# Patient Record
Sex: Male | Born: 1960 | Race: Black or African American | Hispanic: No | Marital: Married | Smoking: Current some day smoker
Health system: Southern US, Community
[De-identification: ages and names within clinical notes are randomized; demographics above are authoritative.]

## PROBLEM LIST (undated history)

## (undated) DIAGNOSIS — I2510 Atherosclerotic heart disease of native coronary artery without angina pectoris: Secondary | ICD-10-CM

## (undated) DIAGNOSIS — I428 Other cardiomyopathies: Secondary | ICD-10-CM

## (undated) DIAGNOSIS — I119 Hypertensive heart disease without heart failure: Secondary | ICD-10-CM

## (undated) DIAGNOSIS — I5020 Unspecified systolic (congestive) heart failure: Secondary | ICD-10-CM

## (undated) DIAGNOSIS — I4819 Other persistent atrial fibrillation: Secondary | ICD-10-CM

## (undated) DIAGNOSIS — I10 Essential (primary) hypertension: Secondary | ICD-10-CM

## (undated) DIAGNOSIS — N182 Chronic kidney disease, stage 2 (mild): Secondary | ICD-10-CM

## (undated) DIAGNOSIS — I509 Heart failure, unspecified: Secondary | ICD-10-CM

## (undated) HISTORY — DX: Heart failure, unspecified: I50.9

## (undated) HISTORY — DX: Essential (primary) hypertension: I10

---

## 2002-11-04 ENCOUNTER — Emergency Department (HOSPITAL_COMMUNITY): Admission: EM | Admit: 2002-11-04 | Discharge: 2002-11-04 | Payer: Self-pay

## 2002-11-04 ENCOUNTER — Encounter: Payer: Self-pay | Admitting: Emergency Medicine

## 2002-11-19 ENCOUNTER — Emergency Department (HOSPITAL_COMMUNITY): Admission: EM | Admit: 2002-11-19 | Discharge: 2002-11-19 | Payer: Self-pay | Admitting: Emergency Medicine

## 2014-06-05 ENCOUNTER — Emergency Department: Payer: Self-pay | Admitting: Emergency Medicine

## 2014-06-06 LAB — BASIC METABOLIC PANEL
Anion Gap: 9 (ref 7–16)
BUN: 13 mg/dL (ref 7–18)
Calcium, Total: 8 mg/dL — ABNORMAL LOW (ref 8.5–10.1)
Chloride: 104 mmol/L (ref 98–107)
Co2: 28 mmol/L (ref 21–32)
Creatinine: 1.2 mg/dL (ref 0.60–1.30)
EGFR (African American): >60
EGFR (Non-African Amer.): >60
Glucose: 98 mg/dL (ref 65–99)
Osmolality: 281 (ref 275–301)
Potassium: 3.9 mmol/L (ref 3.5–5.1)
Sodium: 141 mmol/L (ref 136–145)

## 2014-06-06 LAB — PROTIME-INR
INR: 1
Prothrombin Time: 12.6 secs (ref 11.5–14.7)

## 2014-06-06 LAB — CBC
HCT: 46.8 % (ref 40.0–52.0)
HGB: 16.1 g/dL (ref 13.0–18.0)
MCH: 30.5 pg (ref 26.0–34.0)
MCHC: 34.4 g/dL (ref 32.0–36.0)
MCV: 89 fL (ref 80–100)
Platelet: 245 10*3/uL (ref 150–440)
RBC: 5.27 10*6/uL (ref 4.40–5.90)
RDW: 13.8 % (ref 11.5–14.5)
WBC: 18.4 10*3/uL — ABNORMAL HIGH (ref 3.8–10.6)

## 2014-06-22 ENCOUNTER — Emergency Department: Payer: Self-pay | Admitting: Emergency Medicine

## 2016-11-02 ENCOUNTER — Emergency Department: Payer: Non-veteran care

## 2016-11-02 ENCOUNTER — Observation Stay (HOSPITAL_BASED_OUTPATIENT_CLINIC_OR_DEPARTMENT_OTHER): Payer: Non-veteran care

## 2016-11-02 ENCOUNTER — Observation Stay
Admission: EM | Admit: 2016-11-02 | Discharge: 2016-11-02 | Disposition: A | Discharge status: Home / Self Care | Payer: Non-veteran care | Attending: Internal Medicine | Admitting: Internal Medicine

## 2016-11-02 DIAGNOSIS — R61 Generalized hyperhidrosis: Secondary | ICD-10-CM | POA: Insufficient documentation

## 2016-11-02 DIAGNOSIS — R0600 Dyspnea, unspecified: Secondary | ICD-10-CM | POA: Insufficient documentation

## 2016-11-02 DIAGNOSIS — D72829 Elevated white blood cell count, unspecified: Secondary | ICD-10-CM | POA: Diagnosis not present

## 2016-11-02 DIAGNOSIS — Z8249 Family history of ischemic heart disease and other diseases of the circulatory system: Secondary | ICD-10-CM | POA: Diagnosis not present

## 2016-11-02 DIAGNOSIS — I517 Cardiomegaly: Secondary | ICD-10-CM | POA: Diagnosis not present

## 2016-11-02 DIAGNOSIS — Z9114 Patient's other noncompliance with medication regimen: Secondary | ICD-10-CM | POA: Insufficient documentation

## 2016-11-02 DIAGNOSIS — R0789 Other chest pain: Secondary | ICD-10-CM | POA: Diagnosis not present

## 2016-11-02 DIAGNOSIS — Z720 Tobacco use: Secondary | ICD-10-CM

## 2016-11-02 DIAGNOSIS — I4581 Long QT syndrome: Secondary | ICD-10-CM | POA: Diagnosis not present

## 2016-11-02 DIAGNOSIS — R0609 Other forms of dyspnea: Secondary | ICD-10-CM | POA: Diagnosis present

## 2016-11-02 DIAGNOSIS — F17210 Nicotine dependence, cigarettes, uncomplicated: Secondary | ICD-10-CM | POA: Insufficient documentation

## 2016-11-02 DIAGNOSIS — R079 Chest pain, unspecified: Secondary | ICD-10-CM

## 2016-11-02 DIAGNOSIS — I160 Hypertensive urgency: Principal | ICD-10-CM

## 2016-11-02 HISTORY — DX: Essential (primary) hypertension: I10

## 2016-11-02 LAB — BASIC METABOLIC PANEL
Anion gap: 7 (ref 5–15)
BUN: 14 mg/dL (ref 6–20)
CALCIUM: 8.9 mg/dL (ref 8.9–10.3)
CO2: 28 mmol/L (ref 22–32)
CREATININE: 1.24 mg/dL (ref 0.61–1.24)
Chloride: 103 mmol/L (ref 101–111)
GFR calc Af Amer: >60 mL/min (ref >60)
GLUCOSE: 103 mg/dL — AB (ref 65–99)
Potassium: 4.2 mmol/L (ref 3.5–5.1)
Sodium: 138 mmol/L (ref 135–145)

## 2016-11-02 LAB — CBC
HEMATOCRIT: 42.5 % (ref 40.0–52.0)
Hemoglobin: 15.2 g/dL (ref 13.0–18.0)
MCH: 29.2 pg (ref 26.0–34.0)
MCHC: 35.6 g/dL (ref 32.0–36.0)
MCV: 81.9 fL (ref 80.0–100.0)
PLATELETS: 265 10*3/uL (ref 150–440)
RBC: 5.19 MIL/uL (ref 4.40–5.90)
RDW: 15.1 % — AB (ref 11.5–14.5)
WBC: 19.3 10*3/uL — AB (ref 3.8–10.6)

## 2016-11-02 LAB — NM MYOCAR MULTI W/SPECT W/WALL MOTION / EF
CHL CUP NUCLEAR SRS: 4
CHL CUP NUCLEAR SSS: 0
CHL CUP RESTING HR STRESS: 65 {beats}/min
CSEPHR: 50 %
CSEPPHR: 83 {beats}/min
LV dias vol: 165 mL (ref 62–150)
LV sys vol: 77 mL
SDS: 0
TID: 0.99

## 2016-11-02 LAB — TROPONIN I
Troponin I: <0.03 ng/mL (ref <0.03)
Troponin I: <0.03 ng/mL (ref <0.03)
Troponin I: <0.03 ng/mL (ref <0.03)

## 2016-11-02 LAB — TSH: TSH: 2.217 u[IU]/mL (ref 0.350–4.500)

## 2016-11-02 MED ORDER — REGADENOSON 0.4 MG/5ML IV SOLN
0.4000 mg | Freq: Once | INTRAVENOUS | Status: AC
  Administered 2016-11-02: 0.4 mg via INTRAVENOUS

## 2016-11-02 MED ORDER — ACETAMINOPHEN 325 MG PO TABS: 650.0000 mg | ORAL_TABLET | Freq: Four times a day (QID) | ORAL | Status: DC | PRN

## 2016-11-02 MED ORDER — LISINOPRIL-HYDROCHLOROTHIAZIDE 20-12.5 MG PO TABS: 1.0000 | ORAL_TABLET | Freq: Every day | ORAL | 0 refills | Status: DC

## 2016-11-02 MED ORDER — FUROSEMIDE 10 MG/ML IJ SOLN
60.0000 mg | Freq: Once | INTRAMUSCULAR | Status: AC
  Administered 2016-11-02: 60 mg via INTRAVENOUS
  Filled 2016-11-02: qty 6

## 2016-11-02 MED ORDER — DEXTROSE-NACL 5-0.9 % IV SOLN
INTRAVENOUS | Status: DC
  Administered 2016-11-02: 06:00:00 via INTRAVENOUS

## 2016-11-02 MED ORDER — NITROGLYCERIN 2 % TD OINT
2.0000 [in_us] | TOPICAL_OINTMENT | Freq: Once | TRANSDERMAL | Status: AC
  Administered 2016-11-02: 2 [in_us] via TOPICAL
  Filled 2016-11-02: qty 2

## 2016-11-02 MED ORDER — ASPIRIN 81 MG PO TBEC: 81.0000 mg | DELAYED_RELEASE_TABLET | Freq: Every day | ORAL | Status: DC

## 2016-11-02 MED ORDER — CYCLOBENZAPRINE HCL 10 MG PO TABS
5.0000 mg | ORAL_TABLET | Freq: Once | ORAL | Status: AC
  Administered 2016-11-02: 5 mg via ORAL
  Filled 2016-11-02: qty 1

## 2016-11-02 MED ORDER — ASPIRIN 81 MG PO CHEW
324.0000 mg | CHEWABLE_TABLET | Freq: Once | ORAL | Status: AC
  Administered 2016-11-02: 324 mg via ORAL
  Filled 2016-11-02: qty 4

## 2016-11-02 MED ORDER — LABETALOL HCL 5 MG/ML IV SOLN: 5.0000 mg | Freq: Once | INTRAVENOUS | Status: DC

## 2016-11-02 MED ORDER — LABETALOL HCL 5 MG/ML IV SOLN
5.0000 mg | Freq: Once | INTRAVENOUS | Status: AC
  Administered 2016-11-02: 5 mg via INTRAVENOUS
  Filled 2016-11-02: qty 4

## 2016-11-02 MED ORDER — MORPHINE SULFATE (PF) 4 MG/ML IV SOLN
INTRAVENOUS | Status: AC
  Filled 2016-11-02: qty 1

## 2016-11-02 MED ORDER — ACETAMINOPHEN 650 MG RE SUPP
650.0000 mg | Freq: Four times a day (QID) | RECTAL | Status: DC | PRN
Start: 2016-11-02 — End: 2016-11-02

## 2016-11-02 MED ORDER — ONDANSETRON HCL 4 MG/2ML IJ SOLN
INTRAMUSCULAR | Status: AC
  Filled 2016-11-02: qty 2

## 2016-11-02 MED ORDER — DOCUSATE SODIUM 100 MG PO CAPS
100.0000 mg | ORAL_CAPSULE | Freq: Two times a day (BID) | ORAL | Status: DC
  Administered 2016-11-02: 100 mg via ORAL
  Filled 2016-11-02: qty 1

## 2016-11-02 MED ORDER — METOPROLOL TARTRATE 25 MG PO TABS
25.0000 mg | ORAL_TABLET | Freq: Two times a day (BID) | ORAL | Status: DC
  Administered 2016-11-02: 25 mg via ORAL
  Filled 2016-11-02: qty 1

## 2016-11-02 MED ORDER — MORPHINE SULFATE (PF) 4 MG/ML IV SOLN
4.0000 mg | Freq: Once | INTRAVENOUS | Status: AC
  Administered 2016-11-02: 4 mg via INTRAVENOUS

## 2016-11-02 MED ORDER — ENOXAPARIN SODIUM 40 MG/0.4ML ~~LOC~~ SOLN: 40.0000 mg | SUBCUTANEOUS | Status: DC

## 2016-11-02 MED ORDER — ONDANSETRON HCL 4 MG PO TABS: 4.0000 mg | ORAL_TABLET | Freq: Four times a day (QID) | ORAL | Status: DC | PRN

## 2016-11-02 MED ORDER — ONDANSETRON HCL 4 MG/2ML IJ SOLN
4.0000 mg | Freq: Once | INTRAMUSCULAR | Status: AC
  Administered 2016-11-02: 4 mg via INTRAVENOUS

## 2016-11-02 MED ORDER — ONDANSETRON HCL 4 MG/2ML IJ SOLN: 4.0000 mg | Freq: Four times a day (QID) | INTRAMUSCULAR | Status: DC | PRN

## 2016-11-02 MED ORDER — TECHNETIUM TC 99M TETROFOSMIN IV KIT
12.7970 | PACK | Freq: Once | INTRAVENOUS | Status: AC | PRN
  Administered 2016-11-02: 12.797 via INTRAVENOUS

## 2016-11-02 MED ORDER — ASPIRIN EC 81 MG PO TBEC
81.0000 mg | DELAYED_RELEASE_TABLET | Freq: Every day | ORAL | Status: DC
  Administered 2016-11-02: 81 mg via ORAL
  Filled 2016-11-02: qty 1

## 2016-11-02 MED ORDER — SODIUM CHLORIDE 0.9% FLUSH
3.0000 mL | Freq: Two times a day (BID) | INTRAVENOUS | Status: DC
  Administered 2016-11-02: 3 mL via INTRAVENOUS

## 2016-11-02 MED ORDER — TECHNETIUM TC 99M TETROFOSMIN IV KIT
30.3660 | PACK | Freq: Once | INTRAVENOUS | Status: AC | PRN
  Administered 2016-11-02: 30.366 via INTRAVENOUS

## 2016-11-02 NOTE — Consult Note (Signed)
Cardiology Consultation Note  Patient ID: Gregory Howell, MRN: [id number], DOB/AGE: 1961/01/19 56 y.o. Admit date: 11/02/2016   Date of Consult: 11/02/2016 Primary Physician: [facility] Primary Cardiologist: New to [facility] - consult by Fletcher Anon Requesting Physician: Dr. Marcille Blanco, MD  Chief Complaint: Chest pain Reason for Consult: Same  HPI: 56 y.o. male with h/o HTN, leukocytosis of uncertain etiology, ongoing tobacco abuse, medication noncompliance who presented to [facility] on 3/26 with left-sided chest pain with associated shortness of breath. Troponin negative 2. Cardiology asked to further evaluate.  Patient does not have any previously known cardiac history. He has never seen a cardiologist before. He is followed by the [facility]. Patient has a 30-40-pack-year history of tobacco abuse. His brother had his first MI at age 68. Patient has been in his usual state of health up until 89 when he developed a left-sided chest pain that was characterized as dull, 5 out of 10, and radiated to his back area pain improved with rest. No associated symptoms at that time. While at work (patient works as a Training and development officer) on 3/25 patient again developed left-sided chest pain that radiated to his back characterized as sharp and rated 10 out of 10 in intensity. Patient left the kitchen to sit on the steps for approximately 10-15 minutes with some improvement in his chest pain though no full resolution. He was asked to get back to work, upon returning back to the kitchen he again developed 10 out of 10 sharp left-sided chest pain that radiated to his back prompting him to leave work early. Chest pain is worse with deep inspiration and positional movement. When he got home on 3/25 and rested chest pain did improve though never fully resolved. At approximately 9 PM patient's wife left work to come take the patient to [facility] for further evaluation. Chest pain was associated with some shortness of breath. No associated  diaphoresis, nausea, vomiting, dizziness, presyncope, or syncope. Patient has never had pain like this before. However, he does note an episode of substernal chest pressure that radiated to his back approximately 5-6 weeks prior while walking home from work carrying a heavy backpack. Patient reports having long history of hypertension for which she intermittently takes his antihypertensive medication. He reports he last took his medications at least 6 months prior.  Upon the patient's arrival to [facility] they were found to have BP 185/107, heart rate 85 bpm, afebrile, oxygen saturation 97% room air. Troponin negative 2 at current time. White blood cell count 19,300 with prior from 2 years ago noted to be 18,400, hemoglobin 15.2, pelvic count 265, renal function with serum creatinine 1.24 which appears to be his baseline when reviewing be met from 2 years prior, potassium 4.2, TSH normal. ECG showed sinus rhythm with early repolarization and lateral T-wave inversion, CXR showed mild interstitial edema. In the ED patient was given full dose aspirin and IV Lasix 60 mg 1 as well as labetalol and nitroglycerin paste. Blood pressure has currently improved to 138/88. He reports much improved left-sided chest pain that is now characterized as a dull ache.  Past Medical History:  Diagnosis Date  . HTN (hypertension)       Most Recent Cardiac Studies: None   Surgical History: History reviewed. No pertinent surgical history.   Home Meds: Prior to Admission medications   Not on File    Inpatient Medications:  . aspirin EC  81 mg Oral Daily  . docusate sodium  100 mg Oral BID  .  enoxaparin (LOVENOX) injection  40 mg Subcutaneous Q24H  . sodium chloride flush  3 mL Intravenous Q12H     Allergies: No Known Allergies  Social History   Social History  . Marital status: Married    Spouse name: N/A  . Number of children: N/A  . Years of education: N/A   Occupational History  . Not on file.    Social History Main Topics  . Smoking status: Current Every Day Smoker    Types: Cigarettes  . Smokeless tobacco: Never Used  . Alcohol use Not on file  . Drug use: Unknown  . Sexual activity: Not on file   Other Topics Concern  . Not on file   Social History Narrative  . No narrative on file     Family History  Problem Relation Age of Onset  . Hypertension Mother   . Hypertension Father   . CAD Brother   . Diabetes Brother      Review of Systems: Review of Systems  Constitutional: Positive for malaise/fatigue. Negative for chills, diaphoresis, fever and weight loss.  HENT: Negative for congestion.   Eyes: Negative for discharge and redness.  Respiratory: Positive for shortness of breath. Negative for cough, hemoptysis, sputum production and wheezing.   Cardiovascular: Positive for chest pain. Negative for palpitations, orthopnea, claudication, leg swelling and PND.  Gastrointestinal: Negative for abdominal pain, blood in stool, heartburn, melena, nausea and vomiting.  Genitourinary: Negative for hematuria.  Musculoskeletal: Negative for falls and myalgias.  Skin: Negative for rash.  Neurological: Negative for dizziness, tingling, tremors, sensory change, speech change, focal weakness, loss of consciousness and weakness.  Endo/Heme/Allergies: Does not bruise/bleed easily.  Psychiatric/Behavioral: Negative for substance abuse. The patient is not nervous/anxious.   All other systems reviewed and are negative.   Labs:  Recent Labs  11/02/16 0038 11/02/16 0609  TROPONINI <0.03 <0.03   Lab Results  Component Value Date   WBC 19.3 (H) 11/02/2016   HGB 15.2 11/02/2016   HCT 42.5 11/02/2016   MCV 81.9 11/02/2016   PLT 265 11/02/2016     Recent Labs Lab 11/02/16 0038  NA 138  K 4.2  CL 103  CO2 28  BUN 14  CREATININE 1.24  CALCIUM 8.9  GLUCOSE 103*   No results found for: CHOL, HDL, LDLCALC, TRIG No results found for: DDIMER  Radiology/Studies:  Dg  Chest 2 View  Result Date: 11/02/2016 CLINICAL DATA:  Acute onset of cough and shortness of breath. Severe left-sided chest pain. Initial encounter. EXAM: CHEST  2 VIEW COMPARISON:  None. FINDINGS: The lungs are well-aerated. Vascular congestion is noted. Increased interstitial markings raise concern for mild interstitial edema. There is no evidence of pleural effusion or pneumothorax. The heart is normal in size; the mediastinal contour is within normal limits. No acute osseous abnormalities are seen. IMPRESSION: Vascular congestion. Increased interstitial markings raise concern for mild interstitial edema. Electronically Signed   By: Garald Balding M.D.   On: 11/02/2016 01:11    EKG: Interpreted by me showed: NSR, 88 bpm, LVH, early repolarization, lateral TWI Telemetry: Interpreted by me showed: NSR, 70's bpm  Weights: [employer]   11/02/16 0042 11/02/16 0538  Weight: 200 lb (90.7 kg) 209 lb 12.8 oz (95.2 kg)     Physical Exam: Blood pressure 138/88, pulse 66, temperature 98.3 F (36.8 C), temperature source Oral, resp. rate 19, height '5\' 9"'$  (1.753 m), weight 209 lb 12.8 oz (95.2 kg), SpO2 99 %. Body mass index is 30.98 kg/m.  General: Well developed, well nourished, in no acute distress. Head: Normocephalic, atraumatic, sclera non-icteric, no xanthomas, nares are without discharge.  Neck: Negative for carotid bruits. JVD not elevated. Lungs: Clear bilaterally to auscultation without wheezes, rales, or rhonchi. Breathing is unlabored. Heart: RRR with S1 S2. No murmurs, rubs, or gallops appreciated. Palpation over the left anterior chest wall reproduces patient's pain. Movement worsens patient's pain.  Abdomen: Soft, non-tender, non-distended with normoactive bowel sounds. No hepatomegaly. No rebound/guarding. No obvious abdominal masses. Msk:  Strength and tone appear normal for age. Extremities: No clubbing or cyanosis. No edema. Distal pedal pulses are 2+ and equal  bilaterally. Neuro: Alert and oriented X 3. No facial asymmetry. No focal deficit. Moves all extremities spontaneously. Psych:  Responds to questions appropriately with a normal affect.    Assessment and Plan:  Principal Problem:   Chest pain with moderate risk for cardiac etiology Active Problems:   Hypertensive urgency   Tobacco abuse   Leukocytosis    1. Chest pain with moderate risk for cardiac etiology: -Troponin negative 2 -Symptoms appear mostly atypical in that pain is worse with deep inspiration and positional movement, possibly pulmonary or musculoskeletal in etiology -Risk factors for coronary artery disease include tobacco abuse, hypertension, family history with brother with MI at age 26 -Possibly in the setting of patient's hypertensive urgency -Cannot definitively rule out aortic dissection at this time given patient's blood pressure greater than 329 systolic upon presentation as well as chest pain radiating to the back. Consider CTA aorta protocol, will defer to internal medicine  -Schedule Lexiscan Myoview to evaluate for high-risk ischemia -ASA -Start low-dose Lopressor, titrate as needed -Check lipid panel for further risk stratification  2. Hypertensive urgency: -Has been noncompliant with antihypertensive medications, last taking them at least 6 months prior -Improved in the ED with IV labetalol, currently no antihypertensive medication ordered -Start low-dose Lopressor, titrate as needed -If needed and if has normal EF by echo would use amlodipine and titrate as needed -Possibly playing a role in the above -As above, cannot rule out aortic dissection  3. Chronic leukocytosis: -Patient noted to have white blood cell count 18,402 years prior. Upon admission on 3/26 patient was noted to have leukocytosis with white blood cell count 19,300 -No obvious signs of infection, afebrile -Uncertain what blood work he has had through the [place] [facility] -Discussed  with internal medicine  4. Tobacco abuse: -Cessation is advised  5. Hyperglycemia: -A1c pending   Signed, Christell Faith, PA-C [place] Pager: [phone] 11/02/2016, 10:19 AM

## 2016-11-02 NOTE — ED Triage Notes (Signed)
Pt states that while at work this afternoon he started having severe chest pain, states that he went home and laid down, pt states that the pain eased some, but cont to have it on the left side of his chest, pt reports that he hasn't had his bp meds in 6 mos

## 2016-11-02 NOTE — H&P (Signed)
Gregory Howell is an 56 y.o. male.   Chief Complaint: Chest pain HPI: The patient with past medical history of hypertension presents emergency department with chest pain. The pain began while the patient was at work. 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 The pain was more left-sided over time and eventually radiated into his left shoulder. The patient admits to feeling this way yesterday as well. Upon arrival his blood pressure was greater than 180/100. The patient admits not to having his blood pressure medication for the last 6 months. His blood pressure improved with labetalol and nitroglycerin. EKG did not show any changes consistent with ischemia and initial troponin was negative. However due to uncontrolled blood pressure and chest pain the emergency department staff called the hospitalist service for admission.  Past Medical History:  Diagnosis Date  . HTN (hypertension)     No past surgical history on file. None  Family History  Problem Relation Age of Onset  . Hypertension Mother   . Hypertension Father   . CAD Brother   . Diabetes Brother    Social History:  reports that he has been smoking Cigarettes.  He has never used smokeless tobacco. His alcohol and drug histories are not on file.  Allergies: No Known Allergies  No prescriptions prior to admission.    Results for orders placed or performed during the hospital encounter of 11/02/16 (from the past 48 hour(s))  Basic metabolic panel     Status: Abnormal   Collection Time: 11/02/16 12:38 AM  Result Value Ref Range   Sodium 138 135 - 145 mmol/L   Potassium 4.2 3.5 - 5.1 mmol/L   Chloride 103 101 - 111 mmol/L   CO2 28 22 - 32 mmol/L   Glucose, Bld 103 (H) 65 - 99 mg/dL   BUN 14 6 - 20 mg/dL   Creatinine, Ser 1.24 0.61 - 1.24 mg/dL   Calcium 8.9 8.9 - 10.3 mg/dL   GFR calc non Af Amer >60 >60 mL/min   GFR calc Af Amer >60 >60 mL/min    Comment:  (NOTE) The eGFR has been calculated using the CKD EPI equation. This calculation has not been validated in all clinical situations. eGFR's persistently <60 mL/min signify possible Chronic Kidney Disease.    Anion gap 7 5 - 15  CBC     Status: Abnormal   Collection Time: 11/02/16 12:38 AM  Result Value Ref Range   WBC 19.3 (H) 3.8 - 10.6 K/uL   RBC 5.19 4.40 - 5.90 MIL/uL   Hemoglobin 15.2 13.0 - 18.0 g/dL   HCT 42.5 40.0 - 52.0 %   MCV 81.9 80.0 - 100.0 fL   MCH 29.2 26.0 - 34.0 pg   MCHC 35.6 32.0 - 36.0 g/dL   RDW 15.1 (H) 11.5 - 14.5 %   Platelets 265 150 - 440 K/uL  Troponin I     Status: None   Collection Time: 11/02/16 12:38 AM  Result Value Ref Range   Troponin I <0.03 <0.03 ng/mL  TSH     Status: None   Collection Time: 11/02/16  6:09 AM  Result Value Ref Range   TSH 2.217 0.350 - 4.500 uIU/mL    Comment: Performed by a 3rd Generation assay with a functional sensitivity of <=0.01 uIU/mL.  Troponin I     Status: None   Collection Time: 11/02/16  6:09 AM  Result Value Ref Range  Troponin I <0.03 <0.03 ng/mL   Dg Chest 2 View  Result Date: 11/02/2016 CLINICAL DATA:  Acute onset of cough and shortness of breath. Severe left-sided chest pain. Initial encounter. EXAM: CHEST  2 VIEW COMPARISON:  None. FINDINGS: The lungs are well-aerated. Vascular congestion is noted. Increased interstitial markings raise concern for mild interstitial edema. There is no evidence of pleural effusion or pneumothorax. The heart is normal in size; the mediastinal contour is within normal limits. No acute osseous abnormalities are seen. IMPRESSION: Vascular congestion. Increased interstitial markings raise concern for mild interstitial edema. Electronically Signed   By: Garald Balding M.D.   On: 11/02/2016 01:11    Review of Systems  Constitutional: Positive for diaphoresis. Negative for chills and fever.  HENT: Negative for sore throat and tinnitus.   Eyes: Negative for blurred vision and  redness.  Respiratory: Negative for cough and shortness of breath.   Cardiovascular: Positive for chest pain. Negative for palpitations, orthopnea and PND.  Gastrointestinal: Negative for abdominal pain, diarrhea, nausea and vomiting.  Genitourinary: Negative for dysuria, frequency and urgency.  Musculoskeletal: Negative for joint pain and myalgias.  Skin: Negative for rash.       No lesions  Neurological: Negative for speech change, focal weakness and weakness.  Endo/Heme/Allergies: Does not bruise/bleed easily.       No temperature intolerance  Psychiatric/Behavioral: Negative for depression and suicidal ideas.    Blood pressure 138/88, pulse 66, temperature 98.3 F (36.8 C), temperature source Oral, resp. rate 19, height '5\' 9"'$  (1.753 m), weight 95.2 kg (209 lb 12.8 oz), SpO2 99 %. Physical Exam  Constitutional: He is oriented to person, place, and time. He appears well-developed and well-nourished. No distress.  HENT:  Head: Normocephalic and atraumatic.  Mouth/Throat: Oropharynx is clear and moist.  Eyes: Conjunctivae and EOM are normal. Pupils are equal, round, and reactive to light. No scleral icterus.  Neck: Normal range of motion. Neck supple. No JVD present. No tracheal deviation present. No thyromegaly present.  Cardiovascular: Normal rate, regular rhythm and normal heart sounds.  Exam reveals no gallop and no friction rub.   No murmur heard. Respiratory: Effort normal and breath sounds normal. No respiratory distress.  GI: Soft. Bowel sounds are normal. He exhibits no distension. There is no tenderness.  Genitourinary:  Genitourinary Comments: Deferred  Musculoskeletal: Normal range of motion. He exhibits no edema.  Lymphadenopathy:    He has no cervical adenopathy.  Neurological: He is alert and oriented to person, place, and time. No cranial nerve deficit.  Skin: Skin is warm and dry. No rash noted. No erythema.  Psychiatric: He has a normal mood and affect. His  behavior is normal. Judgment and thought content normal.     Assessment/Plan This is a 56 year old male admitted for chest pain. 1. Chest pain: Resolved. I started the patient on aspirin. He was given some Lasix in the emergency department due to concern for pulmonary congestion. Currently the patient's lungs are clear. He does not have edema and denies history of dyspnea on exertion or orthopnea. 2. Hypertensive urgency: Uncontrolled blood pressure and chest pain. Improving with nitroglycerin and labetalol. Likely secondary to noncompliance with antihypertensive medication. I made the patient nothing by mouth in anticipation of potential stress test. Cardiology has been consulted area 3. DVT prophylaxis: Lovenox 4. GI prophylaxis: None The patient is a full code. Time spent on admission orders and patient care approximately 45 minutes  Harrie Foreman, MD 11/02/2016, 7:52 AM

## 2016-11-02 NOTE — Progress Notes (Signed)
Discharged instructions along home medication list and follow up gone over with patient. Patient verbalized that he understood instructions. Printed rx given to patient. Iv removed and telemetry removed. No c/o chest pain./ c/o right leg cramps. Wife to transport patient home on ra.

## 2016-11-02 NOTE — Care Management (Addendum)
Patient's discharge medication is on the [facility] four dollar list .  Patient will be able to pay for it without going to the [place]

## 2016-11-02 NOTE — ED Notes (Signed)
Pt placed on oxygen at 2lpm for pox while sleeping of 88%.

## 2016-11-02 NOTE — ED Notes (Signed)
Pt updated on admission process. Pt verbalizes understanding.  

## 2016-11-02 NOTE — Care Management (Addendum)
It appears patient's payor is the [place].  Notified [facility] [place] that patient has been placed in observation and faxed the history and physical. Would not qualify for transfer because [facility] not [facility] observation [facility].

## 2016-11-02 NOTE — Discharge Instructions (Signed)
Low salt diet ° °Activity as tolerated °

## 2016-11-02 NOTE — ED Notes (Addendum)
Report to rebecca, rn for lunch relief. Dr. Dolores Frame notified of htn.

## 2016-11-03 LAB — HIV ANTIBODY (ROUTINE TESTING W REFLEX): HIV Screen 4th Generation wRfx: NONREACTIVE

## 2016-11-03 LAB — HEMOGLOBIN A1C
Hgb A1c MFr Bld: 5.3 % (ref 4.8–5.6)
Mean Plasma Glucose: 105 mg/dL

## 2016-11-04 NOTE — Discharge Summary (Signed)
[employer] - [place] at [facility]   PATIENT NAME: Gregory Howell    MR#:  [id number]  DATE OF BIRTH:  1960-08-16  DATE OF ADMISSION:  11/02/2016 ADMITTING PHYSICIAN: Arnaldo Natal, MD  DATE OF DISCHARGE: 11/02/2016  4:01 PM  PRIMARY CARE PHYSICIAN: [facility]   ADMISSION DIAGNOSIS:  Dyspnea on exertion [R06.09] Hypertensive urgency [I16.0] Chest pain, unspecified type [R07.9]  DISCHARGE DIAGNOSIS:  Principal Problem:   Chest pain with moderate risk for cardiac etiology Active Problems:   Hypertensive urgency   Tobacco abuse   Leukocytosis   SECONDARY DIAGNOSIS:   Past Medical History:  Diagnosis Date  . HTN (hypertension)      ADMITTING HISTORY  HPI: The patient with past medical history of hypertension presents emergency department with chest pain. The pain began while the patient was at work. 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 The pain was more left-sided over time and eventually radiated into his left shoulder. The patient admits to feeling this way yesterday as well. Upon arrival his blood pressure was greater than 180/100. The patient admits not to having his blood pressure medication for the last 6 months. His blood pressure improved with labetalol and nitroglycerin. EKG did not show any changes consistent with ischemia and initial troponin was negative. However due to uncontrolled blood pressure and chest pain the emergency department staff called the hospitalist service for admission.  HOSPITAL COURSE:   * Chest pain, atypical Admitted to telemetry floor. Ruled out for acute coronary syndrome with normal troponins. Seen by cardiology. Stress test was low risk. He was started on blood pressure medications due to accelerated hypertension. Patient has been discharged home to follow-up with primary care physician and cardiology as outpatient.  Stable for  discharge.  CONSULTS OBTAINED:  Treatment Team:  Iran Ouch, MD  DRUG ALLERGIES:  No Known Allergies  DISCHARGE MEDICATIONS:   Discharge Medication List as of 11/02/2016  3:27 PM    START taking these medications   Details  aspirin EC 81 MG EC tablet Take 1 tablet (81 mg total) by mouth daily., Starting Tue 11/03/2016, OTC    lisinopril-hydrochlorothiazide (ZESTORETIC) 20-12.5 MG tablet Take 1 tablet by mouth daily., Starting Mon 11/02/2016, Print        Today   VITAL SIGNS:  Blood pressure (!) 146/90, pulse 71, temperature 98.5 F (36.9 C), temperature source Oral, resp. rate 18, height 5\' 9"  (1.753 m), weight 95.2 kg (209 lb 12.8 oz), SpO2 98 %.  I/O:  No intake or output data in the 24 hours ending 11/04/16 1121  PHYSICAL EXAMINATION:  Physical Exam  GENERAL:  56 y.o.-year-old patient lying in the bed with no acute distress.  LUNGS: Normal breath sounds bilaterally, no wheezing, rales,rhonchi or crepitation. No use of accessory muscles of respiration.  CARDIOVASCULAR: S1, S2 normal. No murmurs, rubs, or gallops.  ABDOMEN: Soft, non-tender, non-distended. Bowel sounds present. No organomegaly or mass.  NEUROLOGIC: Moves all 4 extremities. PSYCHIATRIC: The patient is alert and oriented x 3.  SKIN: No obvious rash, lesion, or ulcer.   DATA REVIEW:   CBC  Recent Labs Lab 11/02/16 0038  WBC 19.3*  HGB 15.2  HCT 42.5  PLT 265    Chemistries   Recent Labs Lab 11/02/16 0038  NA 138  K 4.2  CL 103  CO2 28  GLUCOSE 103*  BUN 14  CREATININE 1.24  CALCIUM 8.9  Cardiac Enzymes  Recent Labs Lab 11/02/16 1247  TROPONINI <0.03    Microbiology Results  No results found for this or any previous visit.  RADIOLOGY:  Nm Myocar Multi W/spect W/wall Motion / Ef  Result Date: 11/02/2016  Defect 1: There is a small defect of mild severity present in the apex location. This is likely due to apical thinning artifact.  The study is normal.  This is a  low risk study.  Nuclear stress EF: 50%. The left ventricle is mildly dilated.     Follow up with PCP in 1 week.  Management plans discussed with the patient, family and they are in agreement.  CODE STATUS:  Code Status History    Date Active Date Inactive Code Status Order ID Comments User Context   11/02/2016  5:37 AM 11/02/2016  7:01 PM Full Code [id number]  Arnaldo Natal, MD Inpatient      TOTAL TIME TAKING CARE OF THIS PATIENT ON DAY OF DISCHARGE: more than 30 minutes.   Milagros Loll R M.D on 11/04/2016 at 11:21 AM  Between 7am to 6pm - Pager - [phone]  After 6pm go to [email or web address] - password [email or web address] [facility]  [facility] [facility]  Office  [phone]  CC: Primary care physician; [facility]  Note: This dictation was prepared with Dragon dictation along with smaller phrase technology. Any transcriptional errors that result from this process are unintentional.

## 2016-11-24 NOTE — ED Provider Notes (Signed)
[facility] Emergency Department Provider Note   ____________________________________________   First MD Initiated Contact with Patient 11/02/16 [phone]     (approximate)  I have reviewed the triage vital signs and the nursing notes.   HISTORY  Chief Complaint Chest Pain    HPI Gregory Howell is a 56 y.o. male who presents to the ED from home with a chief complain of chest pain. 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 He went home and lay down but pain persisted. Complains of left chest pressure without associated diaphoresis, shortness of breath, nausea or vomiting. Admits to medical noncompliance with antihypertensives. Denies recent fever, chills, abdominal pain, diarrhea. Denies recent travel or trauma.   Past Medical History:  Diagnosis Date  . HTN (hypertension)     Patient Active Problem List   Diagnosis Date Noted  . Chest pain with moderate risk for cardiac etiology 11/02/2016  . Hypertensive urgency 11/02/2016  . Tobacco abuse 11/02/2016  . Leukocytosis 11/02/2016    History reviewed. No pertinent surgical history.  Prior to Admission medications   Medication Sig Start Date End Date Taking? Authorizing Provider  aspirin EC 81 MG EC tablet Take 1 tablet (81 mg total) by mouth daily. 11/03/16   Srikar Sudini, MD  lisinopril-hydrochlorothiazide (ZESTORETIC) 20-12.5 MG tablet Take 1 tablet by mouth daily. 11/02/16   Milagros Loll, MD    Allergies Patient has no known allergies.  Family History  Problem Relation Age of Onset  . Hypertension Mother   . Hypertension Father   . CAD Brother   . Diabetes Brother     Social History Social History  Substance Use Topics  . Smoking status: Current Every Day Smoker    Types: Cigarettes  . Smokeless tobacco: Never Used  . Alcohol use Not on file    Review of Systems  Constitutional: No fever/chills. Eyes: No visual changes. ENT: No sore  throat. Cardiovascular: Positive for chest pain. Respiratory: Denies shortness of breath. Gastrointestinal: No abdominal pain.  No nausea, no vomiting.  No diarrhea.  No constipation. Genitourinary: Negative for dysuria. Musculoskeletal: Negative for back pain. Skin: Negative for rash. Neurological: Negative for headaches, focal weakness or numbness.  10-point ROS otherwise negative.  ____________________________________________   PHYSICAL EXAM:  VITAL SIGNS: ED Triage Vitals  Enc Vitals Group     BP 11/02/16 0041 (!) 185/107     Pulse Rate 11/02/16 0041 87     Resp 11/02/16 0041 18     Temp 11/02/16 0041 98.5 F (36.9 C)     Temp Source 11/02/16 0041 Oral     SpO2 11/02/16 0041 97 %     Weight 11/02/16 0042 200 lb (90.7 kg)     Height 11/02/16 0042 5\' 9"  (1.753 m)     Head Circumference --      Peak Flow --      Pain Score 11/02/16 0042 2     Pain Loc --      Pain Edu? --      Excl. in GC? --     Constitutional: Alert and oriented. Well appearing and in no acute distress. Eyes: Conjunctivae are normal. PERRL. EOMI. Head: Atraumatic. Nose: No congestion/rhinnorhea. Mouth/Throat: Mucous membranes are moist.  Oropharynx non-erythematous. Neck: No stridor.  No carotid bruits. Cardiovascular: Normal rate, regular rhythm. Grossly normal heart sounds.  Good peripheral circulation. Respiratory: Normal respiratory effort.  No retractions. Lungs CTAB. Gastrointestinal: Soft and nontender. No distention.  No abdominal bruits. No CVA tenderness. Musculoskeletal: No lower extremity tenderness nor edema.  No joint effusions. Neurologic:  Normal speech and language. No gross focal neurologic deficits are appreciated. No gait instability. Skin:  Skin is warm, dry and intact. No rash noted. Psychiatric: Mood and affect are normal. Speech and behavior are normal.  ____________________________________________   LABS (all labs ordered are listed, but only abnormal results are  displayed)  Labs Reviewed  BASIC METABOLIC PANEL - Abnormal; Notable for the following:       Result Value   Glucose, Bld 103 (*)    All other components within normal limits  CBC - Abnormal; Notable for the following:    WBC 19.3 (*)    RDW 15.1 (*)    All other components within normal limits  TROPONIN I  HIV ANTIBODY (ROUTINE TESTING)  TSH  HEMOGLOBIN A1C  TROPONIN I  TROPONIN I   ____________________________________________  EKG  ED ECG REPORT I, SUNG,JADE J, the attending physician, personally viewed and interpreted this ECG.   Date: 11/24/2016  EKG Time: 0035  Rate: 88  Rhythm: normal EKG, normal sinus rhythm  Axis: Normal  Intervals:LVH  ST&T Change: Nonspecific  ____________________________________________  RADIOLOGY  Chest x-ray interpreted per Dr. Cherly Hensen: Vascular congestion. Increased interstitial markings raise concern  for mild interstitial edema.    ____________________________________________   PROCEDURES  Procedure(s) performed: None  Procedures  Critical Care performed: Yes, see critical care note(s)  CRITICAL CARE Performed by: Irean Hong   Total critical care time: 30 minutes  Critical care time was exclusive of separately billable procedures and treating other patients.  Critical care was necessary to treat or prevent imminent or life-threatening deterioration.  Critical care was time spent personally by me on the following activities: development of treatment plan with patient and/or surrogate as well as nursing, discussions with consultants, evaluation of patient's response to treatment, examination of patient, obtaining history from patient or surrogate, ordering and performing treatments and interventions, ordering and review of laboratory studies, ordering and review of radiographic studies, pulse oximetry and re-evaluation of patient's condition. ____________________________________________   INITIAL IMPRESSION / ASSESSMENT  AND PLAN / ED COURSE  Pertinent labs & imaging results that were available during my care of the patient were reviewed by me and considered in my medical decision making (see chart for details).  56 year old male who presents with chest pain. Medically noncompliant with antihypertensives. Initial EKG and troponin are unremarkable; leukocytosis noted without evidence of infection. Will administer aspirin, morphine, nitroglycerin and discuss with hospitalist to evaluate patient in the emergency department for admission.      ____________________________________________   FINAL CLINICAL IMPRESSION(S) / ED DIAGNOSES  Final diagnoses:  Chest pain, unspecified type  Dyspnea on exertion  Hypertensive urgency      NEW MEDICATIONS STARTED DURING THIS VISIT:  Discharge Medication List as of 11/02/2016  3:27 PM    START taking these medications   Details  aspirin EC 81 MG EC tablet Take 1 tablet (81 mg total) by mouth daily., Starting Tue 11/03/2016, OTC    lisinopril-hydrochlorothiazide (ZESTORETIC) 20-12.5 MG tablet Take 1 tablet by mouth daily., Starting Mon 11/02/2016, Print         Note:  This document was prepared using Dragon voice recognition software and may include unintentional dictation errors.    Irean Hong, MD 11/24/16 [phone]

## 2017-02-22 ENCOUNTER — Emergency Department
Admission: EM | Admit: 2017-02-22 | Discharge: 2017-02-22 | Disposition: A | Discharge status: Home / Self Care | Payer: Non-veteran care | Attending: Emergency Medicine | Admitting: Emergency Medicine

## 2017-02-22 ENCOUNTER — Encounter: Payer: Self-pay | Admitting: Emergency Medicine

## 2017-02-22 DIAGNOSIS — F17210 Nicotine dependence, cigarettes, uncomplicated: Secondary | ICD-10-CM | POA: Diagnosis not present

## 2017-02-22 DIAGNOSIS — F1010 Alcohol abuse, uncomplicated: Secondary | ICD-10-CM | POA: Insufficient documentation

## 2017-02-22 DIAGNOSIS — F10929 Alcohol use, unspecified with intoxication, unspecified: Secondary | ICD-10-CM

## 2017-02-22 DIAGNOSIS — Z79899 Other long term (current) drug therapy: Secondary | ICD-10-CM | POA: Insufficient documentation

## 2017-02-22 DIAGNOSIS — Z7982 Long term (current) use of aspirin: Secondary | ICD-10-CM | POA: Diagnosis not present

## 2017-02-22 DIAGNOSIS — F1994 Other psychoactive substance use, unspecified with psychoactive substance-induced mood disorder: Secondary | ICD-10-CM

## 2017-02-22 DIAGNOSIS — Z789 Other specified health status: Secondary | ICD-10-CM

## 2017-02-22 DIAGNOSIS — Z733 Stress, not elsewhere classified: Secondary | ICD-10-CM | POA: Insufficient documentation

## 2017-02-22 DIAGNOSIS — R45850 Homicidal ideations: Secondary | ICD-10-CM | POA: Diagnosis present

## 2017-02-22 DIAGNOSIS — Z7289 Other problems related to lifestyle: Secondary | ICD-10-CM

## 2017-02-22 LAB — COMPREHENSIVE METABOLIC PANEL
ALBUMIN: 4.2 g/dL (ref 3.5–5.0)
ALK PHOS: 114 U/L (ref 38–126)
ALT: 30 U/L (ref 17–63)
AST: 27 U/L (ref 15–41)
Anion gap: 11 (ref 5–15)
BUN: 14 mg/dL (ref 6–20)
CALCIUM: 8.8 mg/dL — AB (ref 8.9–10.3)
CHLORIDE: 102 mmol/L (ref 101–111)
CO2: 24 mmol/L (ref 22–32)
CREATININE: 1.19 mg/dL (ref 0.61–1.24)
GFR calc Af Amer: >60 mL/min (ref >60)
GFR calc non Af Amer: >60 mL/min (ref >60)
GLUCOSE: 145 mg/dL — AB (ref 65–99)
Potassium: 3.4 mmol/L — ABNORMAL LOW (ref 3.5–5.1)
SODIUM: 137 mmol/L (ref 135–145)
Total Bilirubin: 0.5 mg/dL (ref 0.3–1.2)
Total Protein: 7.8 g/dL (ref 6.5–8.1)

## 2017-02-22 LAB — CBC
HEMATOCRIT: 45.2 % (ref 40.0–52.0)
HEMOGLOBIN: 15.8 g/dL (ref 13.0–18.0)
MCH: 28.9 pg (ref 26.0–34.0)
MCHC: 34.9 g/dL (ref 32.0–36.0)
MCV: 82.7 fL (ref 80.0–100.0)
Platelets: 243 10*3/uL (ref 150–440)
RBC: 5.47 MIL/uL (ref 4.40–5.90)
RDW: 15.6 % — ABNORMAL HIGH (ref 11.5–14.5)
WBC: 11.2 10*3/uL — ABNORMAL HIGH (ref 3.8–10.6)

## 2017-02-22 LAB — URINE DRUG SCREEN, QUALITATIVE (ARMC ONLY)
Amphetamines, Ur Screen: NOT DETECTED
BARBITURATES, UR SCREEN: NOT DETECTED
Benzodiazepine, Ur Scrn: NOT DETECTED
COCAINE METABOLITE, UR ~~LOC~~: NOT DETECTED
Cannabinoid 50 Ng, Ur ~~LOC~~: NOT DETECTED
MDMA (ECSTASY) UR SCREEN: NOT DETECTED
METHADONE SCREEN, URINE: NOT DETECTED
OPIATE, UR SCREEN: NOT DETECTED
Phencyclidine (PCP) Ur S: NOT DETECTED
Tricyclic, Ur Screen: NOT DETECTED

## 2017-02-22 LAB — ACETAMINOPHEN LEVEL

## 2017-02-22 LAB — SALICYLATE LEVEL: Salicylate Lvl: <7 mg/dL (ref 2.8–30.0)

## 2017-02-22 LAB — ETHANOL: Alcohol, Ethyl (B): 174 mg/dL — ABNORMAL HIGH (ref <5)

## 2017-02-22 NOTE — Discharge Instructions (Signed)
Please seek medical attention and help for any thoughts about wanting to harm yourself, harm others, any concerning change in behavior, severe depression, inappropriate drug use or any other new or concerning symptoms. ° °

## 2017-02-22 NOTE — ED Notes (Signed)
Pt given TV remote 
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
## 2017-02-22 NOTE — ED Notes (Signed)
SOC    CALLED 

## 2017-02-22 NOTE — ED Notes (Signed)
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
## 2017-02-22 NOTE — ED Notes (Signed)
PT  VOL  SEEN  BY  DR  CLAPACS  PENDING  D/C

## 2017-02-22 NOTE — ED Notes (Signed)
Dr. Clapacs to bedside at this time.  
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
## 2018-04-16 IMAGING — CR DG CHEST 2V
1 series · 2 of 2 positions shown · non-contrast
Comparison: None.

CLINICAL DATA: Acute onset of cough and shortness of breath. Severe
left-sided chest pain. Initial encounter.

EXAM:
CHEST  2 VIEW

[Series 1: dg chest 2 view · 0.14mm/px · 2 of 2 slices shown]
[im 1/2]
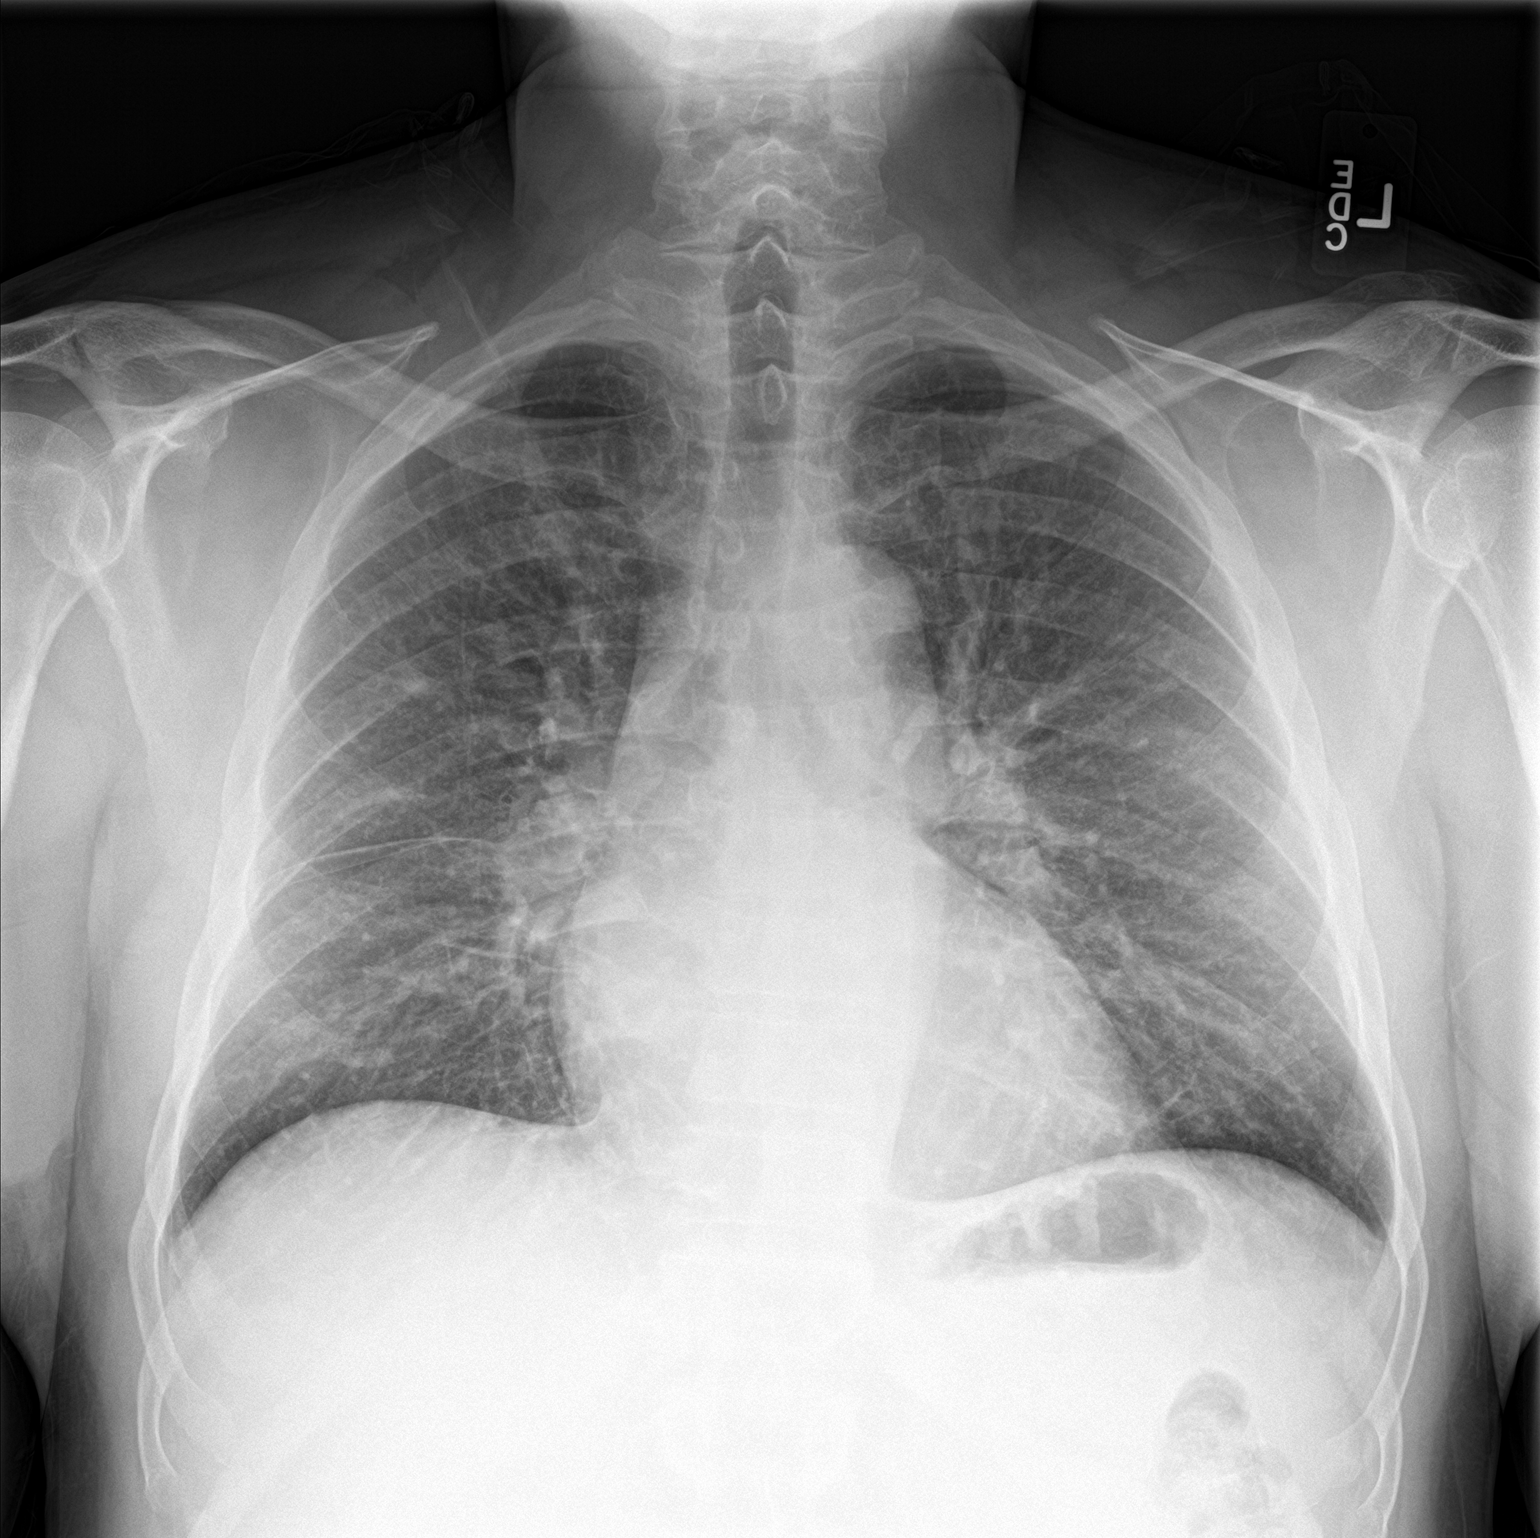
[im 2/2]
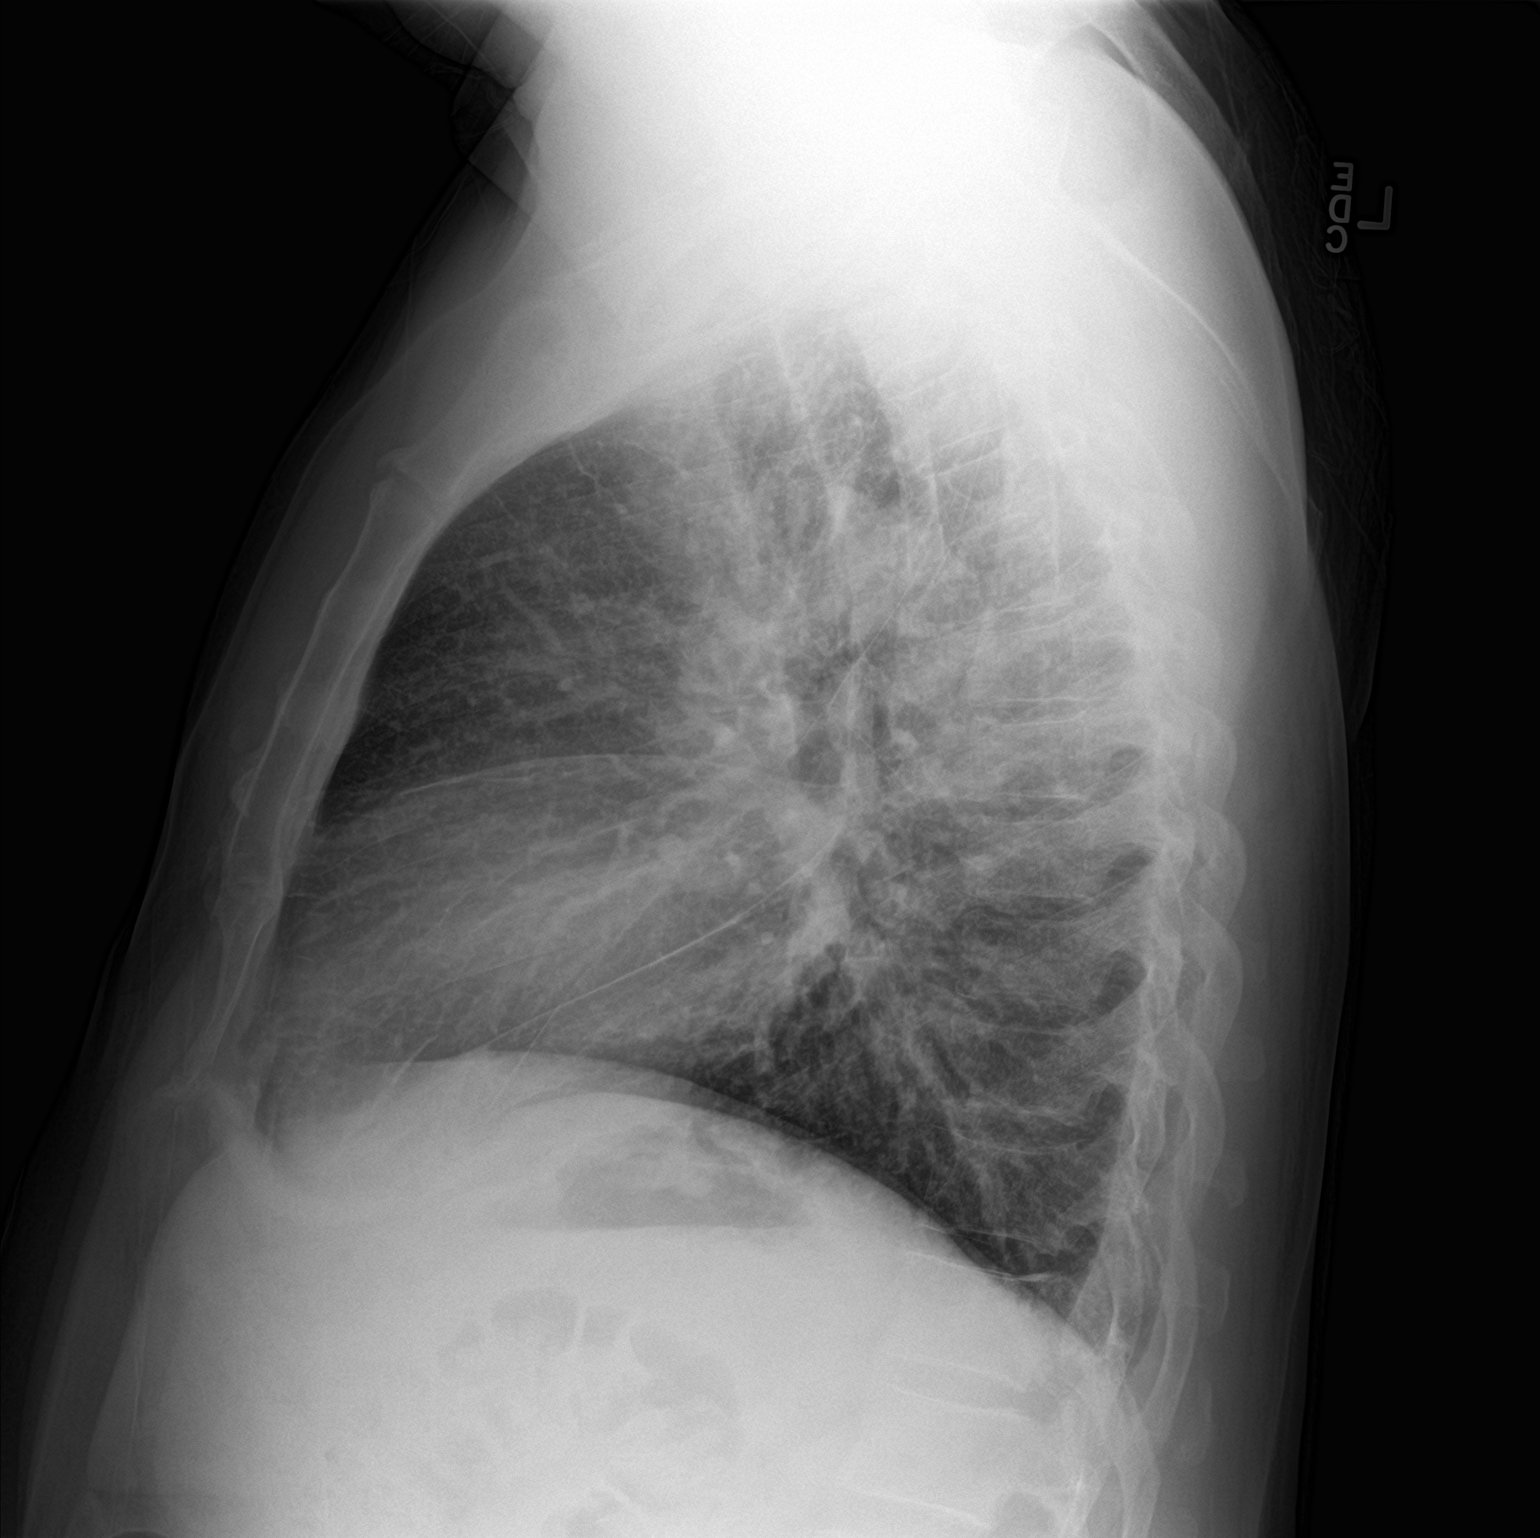

[2 of 2 positions shown; findings below may reference images not displayed]

FINDINGS: The lungs are well-aerated. Vascular congestion is noted. Increased
interstitial markings raise concern for mild interstitial edema.
There is no evidence of pleural effusion or pneumothorax.

The heart is normal in size; the mediastinal contour is within
normal limits. No acute osseous abnormalities are seen.
IMPRESSION: Vascular congestion. Increased interstitial markings raise concern
for mild interstitial edema.

## 2019-10-08 ENCOUNTER — Emergency Department
Admission: EM | Admit: 2019-10-08 | Discharge: 2019-10-08 | Disposition: A | Discharge status: Home / Self Care | Payer: No Typology Code available for payment source | Attending: Emergency Medicine | Admitting: Emergency Medicine

## 2019-10-08 ENCOUNTER — Other Ambulatory Visit: Payer: Self-pay

## 2019-10-08 DIAGNOSIS — Y929 Unspecified place or not applicable: Secondary | ICD-10-CM | POA: Insufficient documentation

## 2019-10-08 DIAGNOSIS — Z79899 Other long term (current) drug therapy: Secondary | ICD-10-CM | POA: Insufficient documentation

## 2019-10-08 DIAGNOSIS — S60221A Contusion of right hand, initial encounter: Secondary | ICD-10-CM

## 2019-10-08 DIAGNOSIS — F17210 Nicotine dependence, cigarettes, uncomplicated: Secondary | ICD-10-CM | POA: Diagnosis not present

## 2019-10-08 DIAGNOSIS — Y999 Unspecified external cause status: Secondary | ICD-10-CM | POA: Insufficient documentation

## 2019-10-08 DIAGNOSIS — W260XXA Contact with knife, initial encounter: Secondary | ICD-10-CM | POA: Insufficient documentation

## 2019-10-08 DIAGNOSIS — S61411A Laceration without foreign body of right hand, initial encounter: Secondary | ICD-10-CM | POA: Insufficient documentation

## 2019-10-08 DIAGNOSIS — Z7982 Long term (current) use of aspirin: Secondary | ICD-10-CM | POA: Diagnosis not present

## 2019-10-08 DIAGNOSIS — Z23 Encounter for immunization: Secondary | ICD-10-CM | POA: Insufficient documentation

## 2019-10-08 DIAGNOSIS — I10 Essential (primary) hypertension: Secondary | ICD-10-CM | POA: Diagnosis not present

## 2019-10-08 DIAGNOSIS — Y9389 Activity, other specified: Secondary | ICD-10-CM | POA: Insufficient documentation

## 2019-10-08 DIAGNOSIS — S6991XA Unspecified injury of right wrist, hand and finger(s), initial encounter: Secondary | ICD-10-CM | POA: Diagnosis present

## 2019-10-08 MED ORDER — LIDOCAINE HCL (PF) 1 % IJ SOLN
5.0000 mL | Freq: Once | INTRAMUSCULAR | Status: AC
  Administered 2019-10-08: 16:00:00 5 mL
  Filled 2019-10-08: qty 5

## 2019-10-08 MED ORDER — TETANUS-DIPHTH-ACELL PERTUSSIS 5-2.5-18.5 LF-MCG/0.5 IM SUSP
0.5000 mL | Freq: Once | INTRAMUSCULAR | Status: AC
  Administered 2019-10-08: 16:00:00 0.5 mL via INTRAMUSCULAR
  Filled 2019-10-08: qty 0.5

## 2019-10-08 MED ORDER — IBUPROFEN 800 MG PO TABS
800.0000 mg | ORAL_TABLET | Freq: Once | ORAL | Status: AC
  Administered 2019-10-08: 800 mg via ORAL
  Filled 2019-10-08: qty 1
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
## 2019-10-08 NOTE — Discharge Instructions (Signed)
You have had your hand laceration repaired using sutures. You have hematoma due to the superficial wound. Keep the wound clean, dry, and covered. Apply ice packs over the dressing to reduce swelling. Take OTC Tylenol and Motrin as needed for pain. Follow-up with the [facility] or this ED in 10-12 days for suture removal.

## 2019-10-12 ENCOUNTER — Other Ambulatory Visit: Payer: Self-pay

## 2019-10-12 ENCOUNTER — Emergency Department
Admission: EM | Admit: 2019-10-12 | Discharge: 2019-10-12 | Disposition: A | Discharge status: Home / Self Care | Payer: No Typology Code available for payment source | Attending: Emergency Medicine | Admitting: Emergency Medicine

## 2019-10-12 DIAGNOSIS — I10 Essential (primary) hypertension: Secondary | ICD-10-CM | POA: Insufficient documentation

## 2019-10-12 DIAGNOSIS — Z7982 Long term (current) use of aspirin: Secondary | ICD-10-CM | POA: Diagnosis not present

## 2019-10-12 DIAGNOSIS — R2231 Localized swelling, mass and lump, right upper limb: Secondary | ICD-10-CM | POA: Insufficient documentation

## 2019-10-12 DIAGNOSIS — F17210 Nicotine dependence, cigarettes, uncomplicated: Secondary | ICD-10-CM | POA: Insufficient documentation

## 2019-10-12 DIAGNOSIS — M79641 Pain in right hand: Secondary | ICD-10-CM | POA: Insufficient documentation

## 2019-10-12 DIAGNOSIS — Z5189 Encounter for other specified aftercare: Secondary | ICD-10-CM | POA: Diagnosis present

## 2019-10-12 DIAGNOSIS — Z79899 Other long term (current) drug therapy: Secondary | ICD-10-CM | POA: Diagnosis not present

## 2019-10-12 DIAGNOSIS — M7989 Other specified soft tissue disorders: Secondary | ICD-10-CM

## 2019-10-12 MED ORDER — SULFAMETHOXAZOLE-TRIMETHOPRIM 800-160 MG PO TABS: 1.0000 | ORAL_TABLET | Freq: Two times a day (BID) | ORAL | 0 refills | Status: DC

## 2019-10-12 MED ORDER — HYDROCODONE-ACETAMINOPHEN 5-325 MG PO TABS: 1.0000 | ORAL_TABLET | Freq: Three times a day (TID) | ORAL | 0 refills | Status: AC | PRN
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
## 2019-10-12 NOTE — Discharge Instructions (Addendum)
Your hand does not have any signs of infection. You do have soft tissue swelling due to the nature of the wound. Continue to keep the wound clean, dry, and covered. Apply cool compresses to reduce swelling. Take OTC ibuprofen as needed.
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
## 2019-10-17 ENCOUNTER — Encounter: Payer: Self-pay | Admitting: Emergency Medicine

## 2019-10-17 ENCOUNTER — Other Ambulatory Visit: Payer: Self-pay

## 2019-10-17 ENCOUNTER — Emergency Department
Admission: EM | Admit: 2019-10-17 | Discharge: 2019-10-17 | Disposition: A | Discharge status: Home / Self Care | Payer: No Typology Code available for payment source | Attending: Emergency Medicine | Admitting: Emergency Medicine

## 2019-10-17 DIAGNOSIS — Z4889 Encounter for other specified surgical aftercare: Secondary | ICD-10-CM | POA: Diagnosis not present

## 2019-10-17 DIAGNOSIS — X58XXXD Exposure to other specified factors, subsequent encounter: Secondary | ICD-10-CM | POA: Insufficient documentation

## 2019-10-17 DIAGNOSIS — Z5189 Encounter for other specified aftercare: Secondary | ICD-10-CM

## 2019-10-17 DIAGNOSIS — S61411D Laceration without foreign body of right hand, subsequent encounter: Secondary | ICD-10-CM | POA: Insufficient documentation

## 2019-10-17 NOTE — ED Provider Notes (Signed)
[facility] Emergency Department Provider Note   ____________________________________________   First MD Initiated Contact with Patient 10/17/19 1418     (approximate)  I have reviewed the triage vital signs and the nursing notes.   HISTORY  Chief Complaint Suture / Staple Removal   HPI Gregory Howell is a 59 y.o. male is here for suture removal on his right hand.  Patient had sutures 9 days ago as not seeing evidence of infection.  Patient states that his hand was doing well until his grandkids came to visit and has repeatedly accidentally hit his hand which is made it swell.  Sutures were placed on 10/08/2019.  He denies any fever or chills.     Past Medical History:  Diagnosis Date  . HTN (hypertension)     Patient Active Problem List   Diagnosis Date Noted  . Substance induced mood disorder ([place]) 02/22/2017  . Alcohol intoxication ([place]) 02/22/2017  . Chest pain with moderate risk for cardiac etiology 11/02/2016  . Hypertensive urgency 11/02/2016  . Tobacco abuse 11/02/2016  . Leukocytosis 11/02/2016    History reviewed. No pertinent surgical history.  Prior to Admission medications   Medication Sig Start Date End Date Taking? Authorizing Provider  aspirin EC 81 MG EC tablet Take 1 tablet (81 mg total) by mouth daily. 11/03/16   Hillary Bow, MD  lisinopril-hydrochlorothiazide (ZESTORETIC) 20-12.5 MG tablet Take 1 tablet by mouth daily. 11/02/16   Hillary Bow, MD  sulfamethoxazole-trimethoprim (BACTRIM DS) 800-160 MG tablet Take 1 tablet by mouth 2 (two) times daily. 10/12/19   Menshew, Dannielle Karvonen, PA-C    Allergies Patient has no known allergies.  Family History  Problem Relation Age of Onset  . Hypertension Mother   . Hypertension Father   . CAD Brother   . Diabetes Brother     Social History Social History   Tobacco Use  . Smoking status: Current Every Day Smoker    Types: Cigarettes  . Smokeless tobacco: Never Used   Substance Use Topics  . Alcohol use: Not on file  . Drug use: Not on file    Review of Systems Constitutional: No fever/chills Cardiovascular: Denies chest pain. Respiratory: Denies shortness of breath. Skin: Laceration peer to right hand dorsal aspect. Neurological: Negative for  focal weakness or numbness. ____________________________________________   PHYSICAL EXAM:  VITAL SIGNS: ED Triage Vitals  Enc Vitals Group     BP 10/17/19 1424 (!) 158/88     Pulse Rate 10/17/19 1424 88     Resp 10/17/19 1424 18     Temp 10/17/19 1424 98 F (36.7 C)     Temp Source 10/17/19 1424 Oral     SpO2 10/17/19 1424 99 %     Weight 10/17/19 1424 200 lb 9.9 oz (91 kg)     Height 10/17/19 1424 5\' 8"  (1.727 m)     Head Circumference --      Peak Flow --      Pain Score 10/17/19 1420 0     Pain Loc --      Pain Edu? --      Excl. in [place]? --     Constitutional: Alert and oriented. Well appearing and in no acute distress. Eyes: Conjunctivae are normal. PERRL. EOMI. Head: Atraumatic. Neck: No stridor.   Musculoskeletal: Right hand dorsal aspect there is some soft tissue edema but no erythema, warmth or pus is noted in the area.  Patient skin is taunt when making a fist  and a decision was made not to remove sutures at this time. Neurologic:  Normal speech and language. No gross focal neurologic deficits are appreciated. No gait instability. Skin:  Skin is warm, dry and intact. Psychiatric: Mood and affect are normal. Speech and behavior are normal.  ____________________________________________   LABS (all labs ordered are listed, but only abnormal results are displayed)  Labs Reviewed - No data to display  PROCEDURES  Procedure(s) performed (including Critical Care):  Procedures   ____________________________________________   INITIAL IMPRESSION / ASSESSMENT AND PLAN / ED COURSE  As part of my medical decision making, I reviewed the following data within the electronic medical  record:  Notes from prior ED visits and [place] Controlled Substance Database  59 year old male presents to the ED for wound check.  Patient states that his sutures were placed approximately 9 days ago and he has been doing well.  He denies any pus, fever or redness.  He states that he was doing much better before his grandkids came to visit and has accidentally hit it several times causing it to swell.  Patient's skin is taunt when he makes a fist and it is more advisable for him to return in 3 to 5 days to have sutures removed or go to his PCP, urgent care or can no clinic acute care for suture removal. ____________________________________________   FINAL CLINICAL IMPRESSION(S) / ED DIAGNOSES  Final diagnoses:  Visit for wound check     ED Discharge Orders    None       Note:  This document was prepared using Dragon voice recognition software and may include unintentional dictation errors.    Tommi Rumps, PA-C 10/17/19 1453    Emily Filbert, MD 10/17/19 1455

## 2019-10-17 NOTE — ED Triage Notes (Signed)
Here for suture removal of right hand   Hand is slight swollen  Sutures intact

## 2019-10-17 NOTE — Discharge Instructions (Signed)
You may follow-up with your primary care provider, urgent care, [facility] acute care or return to the emergency department in 3 to 5 days for suture removal.  Continue to keep the area clean and dry.  Elevate your hand when it is swelling.

## 2019-10-31 ENCOUNTER — Emergency Department
Admission: EM | Admit: 2019-10-31 | Discharge: 2019-10-31 | Disposition: A | Discharge status: Home / Self Care | Payer: No Typology Code available for payment source | Attending: Emergency Medicine | Admitting: Emergency Medicine

## 2019-10-31 DIAGNOSIS — Z4802 Encounter for removal of sutures: Secondary | ICD-10-CM

## 2019-10-31 DIAGNOSIS — S61401D Unspecified open wound of right hand, subsequent encounter: Secondary | ICD-10-CM | POA: Insufficient documentation

## 2019-10-31 DIAGNOSIS — X58XXXD Exposure to other specified factors, subsequent encounter: Secondary | ICD-10-CM | POA: Insufficient documentation

## 2019-10-31 NOTE — ED Notes (Signed)
Pt verbalized discharge instructions and has no questions at this time 

## 2019-10-31 NOTE — ED Provider Notes (Signed)
[facility] Emergency Department Provider Note  ____________________________________________   First MD Initiated Contact with Patient 10/31/19 1226     (approximate)  I have reviewed the triage vital signs and the nursing notes.   HISTORY  Chief Complaint stiches removal  HPI Gregory Howell is a 59 y.o. male presents to the ED for suture removal.  Patient denies any complaints or concerns for infection.  He states he has not been able to see the [facility] for recheck of his blood pressure but is aware that it is elevated.     Past Medical History:  Diagnosis Date  . HTN (hypertension)     Patient Active Problem List   Diagnosis Date Noted  . Substance induced mood disorder ([place]) 02/22/2017  . Alcohol intoxication ([place]) 02/22/2017  . Chest pain with moderate risk for cardiac etiology 11/02/2016  . Hypertensive urgency 11/02/2016  . Tobacco abuse 11/02/2016  . Leukocytosis 11/02/2016    History reviewed. No pertinent surgical history.  Prior to Admission medications   Medication Sig Start Date End Date Taking? Authorizing Provider  aspirin EC 81 MG EC tablet Take 1 tablet (81 mg total) by mouth daily. 11/03/16   Hillary Bow, MD  lisinopril-hydrochlorothiazide (ZESTORETIC) 20-12.5 MG tablet Take 1 tablet by mouth daily. 11/02/16   Hillary Bow, MD  sulfamethoxazole-trimethoprim (BACTRIM DS) 800-160 MG tablet Take 1 tablet by mouth 2 (two) times daily. 10/12/19   Menshew, Dannielle Karvonen, PA-C    Allergies Patient has no known allergies.  Family History  Problem Relation Age of Onset  . Hypertension Mother   . Hypertension Father   . CAD Brother   . Diabetes Brother     Social History Social History   Tobacco Use  . Smoking status: Current Every Day Smoker    Types: Cigarettes  . Smokeless tobacco: Never Used  Substance Use Topics  . Alcohol use: Not on file  . Drug use: Not on file    Review of Systems Constitutional: No  fever/chills Musculoskeletal: Negative for right hand pain. Skin: Healing area right hand. Neurological: Negative for  focal weakness or numbness. ____________________________________________   PHYSICAL EXAM:  VITAL SIGNS: ED Triage Vitals [10/31/19 1222]  Enc Vitals Group     BP (!) 166/108     Pulse Rate 84     Resp 18     Temp 98.5 F (36.9 C)     Temp src      SpO2 99 %     Weight 200 lb 9.9 oz (91 kg)     Height 5\' 8"  (1.727 m)     Head Circumference      Peak Flow      Pain Score 2     Pain Loc      Pain Edu?      Excl. in [place]?     Constitutional: Alert and oriented. Well appearing and in no acute distress. Eyes: Conjunctivae are normal.  Head: Atraumatic. Neck: No stridor.   Cardiovascular:    Good peripheral circulation. Respiratory: Normal respiratory effort.  No retractions. Musculoskeletal: Moves upper and lower extremities without any difficulty and is able to make a fist with his right hand.  Neurologic:  Normal speech and language. No gross focal neurologic deficits are appreciated. No gait instability. Skin:  Skin is warm, dry.  3 sutures are noted on the dorsal aspect of the right hand. Psychiatric: Mood and affect are normal. Speech and behavior are normal.  ____________________________________________  LABS (all labs ordered are listed, but only abnormal results are displayed)  Labs Reviewed - No data to display   PROCEDURES  Procedure(s) performed (including Critical Care):  Procedures  3 nylon sutures were removed from the right hand by provider in triage. ____________________________________________   INITIAL IMPRESSION / ASSESSMENT AND PLAN / ED COURSE  As part of my medical decision making, I reviewed the following data within the electronic MEDICAL RECORD NUMBER Notes from prior ED visits and [place] Controlled Substance Database  59 year old male presents to the ED for suture removal.  He was seen by myself on 10/17/2019 and told to return  today for suture removal.  No obvious infection and patient is able to move hand without any difficulty.  3 sutures were removed by provider and patient was discharged from triage.  ____________________________________________   FINAL CLINICAL IMPRESSION(S) / ED DIAGNOSES  Final diagnoses:  Encounter for removal of sutures     ED Discharge Orders    None       Note:  This document was prepared using Dragon voice recognition software and may include unintentional dictation errors.    Tommi Rumps, PA-C 10/31/19 1236    Jene Every, MD 10/31/19 [phone]

## 2019-10-31 NOTE — ED Triage Notes (Signed)
Pt here for stiches removal. Pt denies any other complaints

## 2019-12-03 ENCOUNTER — Emergency Department: Payer: No Typology Code available for payment source

## 2019-12-03 ENCOUNTER — Other Ambulatory Visit: Payer: Self-pay

## 2019-12-03 DIAGNOSIS — I5021 Acute systolic (congestive) heart failure: Secondary | ICD-10-CM | POA: Diagnosis present

## 2019-12-03 DIAGNOSIS — I160 Hypertensive urgency: Secondary | ICD-10-CM | POA: Diagnosis present

## 2019-12-03 DIAGNOSIS — I130 Hypertensive heart and chronic kidney disease with heart failure and stage 1 through stage 4 chronic kidney disease, or unspecified chronic kidney disease: Secondary | ICD-10-CM | POA: Diagnosis not present

## 2019-12-03 DIAGNOSIS — N189 Chronic kidney disease, unspecified: Secondary | ICD-10-CM | POA: Diagnosis present

## 2019-12-03 DIAGNOSIS — R6510 Systemic inflammatory response syndrome (SIRS) of non-infectious origin without acute organ dysfunction: Secondary | ICD-10-CM | POA: Diagnosis present

## 2019-12-03 DIAGNOSIS — F17210 Nicotine dependence, cigarettes, uncomplicated: Secondary | ICD-10-CM | POA: Diagnosis present

## 2019-12-03 DIAGNOSIS — I272 Pulmonary hypertension, unspecified: Secondary | ICD-10-CM | POA: Diagnosis present

## 2019-12-03 DIAGNOSIS — I25118 Atherosclerotic heart disease of native coronary artery with other forms of angina pectoris: Secondary | ICD-10-CM | POA: Diagnosis present

## 2019-12-03 DIAGNOSIS — Z79899 Other long term (current) drug therapy: Secondary | ICD-10-CM

## 2019-12-03 DIAGNOSIS — N179 Acute kidney failure, unspecified: Secondary | ICD-10-CM | POA: Diagnosis present

## 2019-12-03 DIAGNOSIS — I4819 Other persistent atrial fibrillation: Secondary | ICD-10-CM | POA: Diagnosis present

## 2019-12-03 DIAGNOSIS — R0603 Acute respiratory distress: Secondary | ICD-10-CM | POA: Diagnosis present

## 2019-12-03 DIAGNOSIS — Z716 Tobacco abuse counseling: Secondary | ICD-10-CM

## 2019-12-03 DIAGNOSIS — Z7289 Other problems related to lifestyle: Secondary | ICD-10-CM

## 2019-12-03 DIAGNOSIS — Z8249 Family history of ischemic heart disease and other diseases of the circulatory system: Secondary | ICD-10-CM

## 2019-12-03 DIAGNOSIS — I420 Dilated cardiomyopathy: Secondary | ICD-10-CM | POA: Diagnosis present

## 2019-12-03 DIAGNOSIS — I700 Atherosclerosis of aorta: Secondary | ICD-10-CM | POA: Diagnosis present

## 2019-12-03 DIAGNOSIS — Z20822 Contact with and (suspected) exposure to covid-19: Secondary | ICD-10-CM | POA: Diagnosis present

## 2019-12-03 DIAGNOSIS — R1011 Right upper quadrant pain: Secondary | ICD-10-CM | POA: Diagnosis not present

## 2019-12-03 DIAGNOSIS — Z7982 Long term (current) use of aspirin: Secondary | ICD-10-CM

## 2019-12-03 DIAGNOSIS — Z7141 Alcohol abuse counseling and surveillance of alcoholic: Secondary | ICD-10-CM

## 2019-12-03 LAB — CBC
HCT: 39.7 % (ref 39.0–52.0)
Hemoglobin: 14.7 g/dL (ref 13.0–17.0)
MCH: 29.7 pg (ref 26.0–34.0)
MCHC: 37 g/dL — ABNORMAL HIGH (ref 30.0–36.0)
MCV: 80.2 fL (ref 80.0–100.0)
Platelets: 262 10*3/uL (ref 150–400)
RBC: 4.95 MIL/uL (ref 4.22–5.81)
RDW: 15.1 % (ref 11.5–15.5)
WBC: 15.9 10*3/uL — ABNORMAL HIGH (ref 4.0–10.5)
nRBC: 0 % (ref 0.0–0.2)

## 2019-12-03 LAB — COMPREHENSIVE METABOLIC PANEL
ALT: 43 U/L (ref 0–44)
AST: 27 U/L (ref 15–41)
Albumin: 3.8 g/dL (ref 3.5–5.0)
Alkaline Phosphatase: 122 U/L (ref 38–126)
Anion gap: 7 (ref 5–15)
BUN: 12 mg/dL (ref 6–20)
CO2: 25 mmol/L (ref 22–32)
Calcium: 8.4 mg/dL — ABNORMAL LOW (ref 8.9–10.3)
Chloride: 104 mmol/L (ref 98–111)
Creatinine, Ser: 1.31 mg/dL — ABNORMAL HIGH (ref 0.61–1.24)
GFR calc Af Amer: >60 mL/min (ref >60)
GFR calc non Af Amer: 59 mL/min — ABNORMAL LOW (ref >60)
Glucose, Bld: 117 mg/dL — ABNORMAL HIGH (ref 70–99)
Potassium: 4.3 mmol/L (ref 3.5–5.1)
Sodium: 136 mmol/L (ref 135–145)
Total Bilirubin: 1.5 mg/dL — ABNORMAL HIGH (ref 0.3–1.2)
Total Protein: 7.3 g/dL (ref 6.5–8.1)

## 2019-12-03 LAB — LIPASE, BLOOD: Lipase: 19 U/L (ref 11–51)

## 2019-12-03 NOTE — ED Triage Notes (Addendum)
Pt comes to ED via POV due to SOB that has been ongoing for "months" and pain that is reported to be all over but worse on right side of abdomen. Pt states that he has history of fluid in lungs and smoking, states he can only sit in recliner and cannot lay flat. States pain occurs with movement and cough.

## 2019-12-04 ENCOUNTER — Emergency Department: Payer: No Typology Code available for payment source

## 2019-12-04 ENCOUNTER — Encounter: Payer: Self-pay | Admitting: Radiology

## 2019-12-04 ENCOUNTER — Inpatient Hospital Stay (HOSPITAL_COMMUNITY)
Admit: 2019-12-04 | Discharge: 2019-12-04 | Disposition: A | Discharge status: Home / Self Care | Payer: No Typology Code available for payment source | Attending: Physician Assistant | Admitting: Physician Assistant

## 2019-12-04 ENCOUNTER — Inpatient Hospital Stay
Admission: EM | Admit: 2019-12-04 | Discharge: 2019-12-08 | DRG: 286 | Disposition: A | Discharge status: Home / Self Care | Payer: No Typology Code available for payment source | Attending: Family Medicine | Admitting: Family Medicine

## 2019-12-04 DIAGNOSIS — I25118 Atherosclerotic heart disease of native coronary artery with other forms of angina pectoris: Secondary | ICD-10-CM | POA: Diagnosis present

## 2019-12-04 DIAGNOSIS — I5031 Acute diastolic (congestive) heart failure: Secondary | ICD-10-CM

## 2019-12-04 DIAGNOSIS — A419 Sepsis, unspecified organism: Secondary | ICD-10-CM | POA: Diagnosis not present

## 2019-12-04 DIAGNOSIS — I509 Heart failure, unspecified: Secondary | ICD-10-CM

## 2019-12-04 DIAGNOSIS — F17210 Nicotine dependence, cigarettes, uncomplicated: Secondary | ICD-10-CM | POA: Diagnosis present

## 2019-12-04 DIAGNOSIS — I272 Pulmonary hypertension, unspecified: Secondary | ICD-10-CM | POA: Diagnosis present

## 2019-12-04 DIAGNOSIS — Z720 Tobacco use: Secondary | ICD-10-CM | POA: Diagnosis present

## 2019-12-04 DIAGNOSIS — I4891 Unspecified atrial fibrillation: Secondary | ICD-10-CM | POA: Diagnosis present

## 2019-12-04 DIAGNOSIS — I10 Essential (primary) hypertension: Secondary | ICD-10-CM

## 2019-12-04 DIAGNOSIS — R1011 Right upper quadrant pain: Secondary | ICD-10-CM

## 2019-12-04 DIAGNOSIS — N179 Acute kidney failure, unspecified: Secondary | ICD-10-CM

## 2019-12-04 DIAGNOSIS — I2510 Atherosclerotic heart disease of native coronary artery without angina pectoris: Secondary | ICD-10-CM | POA: Diagnosis not present

## 2019-12-04 DIAGNOSIS — Z716 Tobacco abuse counseling: Secondary | ICD-10-CM | POA: Diagnosis not present

## 2019-12-04 DIAGNOSIS — D72829 Elevated white blood cell count, unspecified: Secondary | ICD-10-CM | POA: Diagnosis not present

## 2019-12-04 DIAGNOSIS — I428 Other cardiomyopathies: Secondary | ICD-10-CM | POA: Diagnosis not present

## 2019-12-04 DIAGNOSIS — I248 Other forms of acute ischemic heart disease: Secondary | ICD-10-CM | POA: Diagnosis not present

## 2019-12-04 DIAGNOSIS — R6510 Systemic inflammatory response syndrome (SIRS) of non-infectious origin without acute organ dysfunction: Secondary | ICD-10-CM | POA: Diagnosis present

## 2019-12-04 DIAGNOSIS — I130 Hypertensive heart and chronic kidney disease with heart failure and stage 1 through stage 4 chronic kidney disease, or unspecified chronic kidney disease: Secondary | ICD-10-CM | POA: Diagnosis present

## 2019-12-04 DIAGNOSIS — J189 Pneumonia, unspecified organism: Secondary | ICD-10-CM | POA: Diagnosis present

## 2019-12-04 DIAGNOSIS — I160 Hypertensive urgency: Secondary | ICD-10-CM | POA: Diagnosis present

## 2019-12-04 DIAGNOSIS — R0603 Acute respiratory distress: Secondary | ICD-10-CM | POA: Diagnosis present

## 2019-12-04 DIAGNOSIS — Z8249 Family history of ischemic heart disease and other diseases of the circulatory system: Secondary | ICD-10-CM | POA: Diagnosis not present

## 2019-12-04 DIAGNOSIS — Z20822 Contact with and (suspected) exposure to covid-19: Secondary | ICD-10-CM | POA: Diagnosis present

## 2019-12-04 DIAGNOSIS — I420 Dilated cardiomyopathy: Secondary | ICD-10-CM | POA: Diagnosis present

## 2019-12-04 DIAGNOSIS — Z79899 Other long term (current) drug therapy: Secondary | ICD-10-CM | POA: Diagnosis not present

## 2019-12-04 DIAGNOSIS — I5043 Acute on chronic combined systolic (congestive) and diastolic (congestive) heart failure: Secondary | ICD-10-CM | POA: Diagnosis not present

## 2019-12-04 DIAGNOSIS — I4819 Other persistent atrial fibrillation: Secondary | ICD-10-CM | POA: Diagnosis present

## 2019-12-04 DIAGNOSIS — N189 Chronic kidney disease, unspecified: Secondary | ICD-10-CM | POA: Diagnosis present

## 2019-12-04 DIAGNOSIS — Z7289 Other problems related to lifestyle: Secondary | ICD-10-CM | POA: Diagnosis not present

## 2019-12-04 DIAGNOSIS — I700 Atherosclerosis of aorta: Secondary | ICD-10-CM | POA: Diagnosis present

## 2019-12-04 DIAGNOSIS — Z7982 Long term (current) use of aspirin: Secondary | ICD-10-CM | POA: Diagnosis not present

## 2019-12-04 DIAGNOSIS — I5021 Acute systolic (congestive) heart failure: Secondary | ICD-10-CM

## 2019-12-04 DIAGNOSIS — Z7141 Alcohol abuse counseling and surveillance of alcoholic: Secondary | ICD-10-CM | POA: Diagnosis not present

## 2019-12-04 LAB — URINALYSIS, COMPLETE (UACMP) WITH MICROSCOPIC
Glucose, UA: NEGATIVE mg/dL
Ketones, ur: NEGATIVE mg/dL
Leukocytes,Ua: NEGATIVE
Nitrite: NEGATIVE
Protein, ur: 100 mg/dL — AB
Specific Gravity, Urine: 1.02 (ref 1.005–1.030)
pH: 5.5 (ref 5.0–8.0)

## 2019-12-04 LAB — URINALYSIS, ROUTINE W REFLEX MICROSCOPIC
Bilirubin Urine: NEGATIVE
Glucose, UA: NEGATIVE mg/dL
Hgb urine dipstick: NEGATIVE
Ketones, ur: NEGATIVE mg/dL
Leukocytes,Ua: NEGATIVE
Nitrite: NEGATIVE
Specific Gravity, Urine: 1.01 (ref 1.005–1.030)
pH: 5.5 (ref 5.0–8.0)

## 2019-12-04 LAB — BRAIN NATRIURETIC PEPTIDE: B Natriuretic Peptide: 660 pg/mL — ABNORMAL HIGH (ref 0.0–100.0)

## 2019-12-04 LAB — URINALYSIS, MICROSCOPIC (REFLEX)
Bacteria, UA: NONE SEEN
RBC / HPF: NONE SEEN RBC/hpf (ref 0–5)

## 2019-12-04 LAB — APTT: aPTT: 36 seconds (ref 24–36)

## 2019-12-04 LAB — BASIC METABOLIC PANEL
Anion gap: 9 (ref 5–15)
BUN: 16 mg/dL (ref 6–20)
CO2: 24 mmol/L (ref 22–32)
Calcium: 8.5 mg/dL — ABNORMAL LOW (ref 8.9–10.3)
Chloride: 103 mmol/L (ref 98–111)
Creatinine, Ser: 1.36 mg/dL — ABNORMAL HIGH (ref 0.61–1.24)
GFR calc Af Amer: >60 mL/min (ref >60)
GFR calc non Af Amer: 56 mL/min — ABNORMAL LOW (ref >60)
Glucose, Bld: 104 mg/dL — ABNORMAL HIGH (ref 70–99)
Potassium: 4.5 mmol/L (ref 3.5–5.1)
Sodium: 136 mmol/L (ref 135–145)

## 2019-12-04 LAB — RESPIRATORY PANEL BY RT PCR (FLU A&B, COVID)
Influenza A by PCR: NEGATIVE
Influenza B by PCR: NEGATIVE
SARS Coronavirus 2 by RT PCR: NEGATIVE

## 2019-12-04 LAB — STREP PNEUMONIAE URINARY ANTIGEN: Strep Pneumo Urinary Antigen: NEGATIVE

## 2019-12-04 LAB — TSH: TSH: 1.413 u[IU]/mL (ref 0.350–4.500)

## 2019-12-04 LAB — PROTIME-INR
INR: 1.2 (ref 0.8–1.2)
Prothrombin Time: 14.9 seconds (ref 11.4–15.2)

## 2019-12-04 LAB — HEPARIN LEVEL (UNFRACTIONATED): Heparin Unfractionated: 0.24 IU/mL — ABNORMAL LOW (ref 0.30–0.70)

## 2019-12-04 LAB — TROPONIN I (HIGH SENSITIVITY)
Troponin I (High Sensitivity): 45 ng/L — ABNORMAL HIGH (ref <18)
Troponin I (High Sensitivity): 44 ng/L — ABNORMAL HIGH (ref <18)

## 2019-12-04 LAB — PROCALCITONIN: Procalcitonin: <0.1 ng/mL

## 2019-12-04 LAB — LACTIC ACID, PLASMA: Lactic Acid, Venous: 1.4 mmol/L (ref 0.5–1.9)

## 2019-12-04 LAB — HIV ANTIBODY (ROUTINE TESTING W REFLEX): HIV Screen 4th Generation wRfx: NONREACTIVE

## 2019-12-04 MED ORDER — LABETALOL HCL 5 MG/ML IV SOLN: 20.0000 mg | INTRAVENOUS | Status: DC | PRN

## 2019-12-04 MED ORDER — DILTIAZEM HCL ER COATED BEADS 180 MG PO CP24
180.0000 mg | ORAL_CAPSULE | Freq: Once | ORAL | Status: AC
  Administered 2019-12-04: 04:00:00 180 mg via ORAL
  Filled 2019-12-04: qty 1

## 2019-12-04 MED ORDER — TRAMADOL HCL 50 MG PO TABS: 50.0000 mg | ORAL_TABLET | Freq: Four times a day (QID) | ORAL | Status: DC | PRN

## 2019-12-04 MED ORDER — HEPARIN BOLUS VIA INFUSION
1350.0000 [IU] | Freq: Once | INTRAVENOUS | Status: AC
  Administered 2019-12-04: 22:00:00 1350 [IU] via INTRAVENOUS
  Filled 2019-12-04: qty 1350

## 2019-12-04 MED ORDER — ENOXAPARIN SODIUM 40 MG/0.4ML ~~LOC~~ SOLN
40.0000 mg | SUBCUTANEOUS | Status: DC
  Administered 2019-12-04: 40 mg via SUBCUTANEOUS
  Filled 2019-12-04: qty 0.4

## 2019-12-04 MED ORDER — AMLODIPINE BESYLATE 5 MG PO TABS: 10.0000 mg | ORAL_TABLET | Freq: Every day | ORAL | Status: DC

## 2019-12-04 MED ORDER — OXYCODONE HCL 5 MG PO TABS: 5.0000 mg | ORAL_TABLET | Freq: Four times a day (QID) | ORAL | Status: DC | PRN

## 2019-12-04 MED ORDER — MORPHINE SULFATE (PF) 4 MG/ML IV SOLN: 4.0000 mg | INTRAVENOUS | Status: DC | PRN

## 2019-12-04 MED ORDER — SODIUM CHLORIDE 0.9% FLUSH
3.0000 mL | Freq: Two times a day (BID) | INTRAVENOUS | Status: DC
  Administered 2019-12-04 – 2019-12-07 (×6): 3 mL via INTRAVENOUS

## 2019-12-04 MED ORDER — SODIUM CHLORIDE 0.9 % IV SOLN
2.0000 g | INTRAVENOUS | Status: DC
  Administered 2019-12-04 – 2019-12-05 (×2): 2 g via INTRAVENOUS
  Filled 2019-12-04 (×2): qty 20

## 2019-12-04 MED ORDER — TRAZODONE HCL 50 MG PO TABS
25.0000 mg | ORAL_TABLET | Freq: Every evening | ORAL | Status: DC | PRN
  Administered 2019-12-04: 25 mg via ORAL
  Filled 2019-12-04: qty 1

## 2019-12-04 MED ORDER — DILTIAZEM HCL ER COATED BEADS 120 MG PO CP24
120.0000 mg | ORAL_CAPSULE | Freq: Every day | ORAL | Status: DC
  Administered 2019-12-04: 120 mg via ORAL
  Filled 2019-12-04: qty 1

## 2019-12-04 MED ORDER — FUROSEMIDE 10 MG/ML IJ SOLN
20.0000 mg | Freq: Two times a day (BID) | INTRAMUSCULAR | Status: DC
  Administered 2019-12-04 – 2019-12-05 (×3): 20 mg via INTRAVENOUS
  Filled 2019-12-04 (×3): qty 2

## 2019-12-04 MED ORDER — ONDANSETRON HCL 4 MG/2ML IJ SOLN: 4.0000 mg | Freq: Four times a day (QID) | INTRAMUSCULAR | Status: DC | PRN

## 2019-12-04 MED ORDER — DILTIAZEM HCL 25 MG/5ML IV SOLN: 20.0000 mg | Freq: Once | INTRAVENOUS | Status: DC

## 2019-12-04 MED ORDER — ENOXAPARIN SODIUM 40 MG/0.4ML ~~LOC~~ SOLN: 40.0000 mg | SUBCUTANEOUS | Status: DC

## 2019-12-04 MED ORDER — ASPIRIN EC 325 MG PO TBEC
325.0000 mg | DELAYED_RELEASE_TABLET | Freq: Every day | ORAL | Status: DC
  Administered 2019-12-05: 17:00:00 325 mg via ORAL
  Filled 2019-12-04: qty 1

## 2019-12-04 MED ORDER — ASPIRIN EC 81 MG PO TBEC: 81.0000 mg | DELAYED_RELEASE_TABLET | Freq: Every day | ORAL | Status: DC

## 2019-12-04 MED ORDER — DOXYCYCLINE HYCLATE 100 MG PO TABS
100.0000 mg | ORAL_TABLET | Freq: Once | ORAL | Status: AC
  Administered 2019-12-04: 100 mg via ORAL
  Filled 2019-12-04: qty 1

## 2019-12-04 MED ORDER — METRONIDAZOLE IN NACL 5-0.79 MG/ML-% IV SOLN
500.0000 mg | Freq: Three times a day (TID) | INTRAVENOUS | Status: DC
  Administered 2019-12-04 – 2019-12-05 (×3): 500 mg via INTRAVENOUS
  Filled 2019-12-04 (×6): qty 100

## 2019-12-04 MED ORDER — SODIUM CHLORIDE 0.9% FLUSH: 3.0000 mL | INTRAVENOUS | Status: DC | PRN

## 2019-12-04 MED ORDER — APIXABAN 5 MG PO TABS: 5.0000 mg | ORAL_TABLET | Freq: Two times a day (BID) | ORAL | Status: DC

## 2019-12-04 MED ORDER — MAGNESIUM SULFATE 2 GM/50ML IV SOLN
2.0000 g | Freq: Once | INTRAVENOUS | Status: AC
  Administered 2019-12-04: 2 g via INTRAVENOUS
  Filled 2019-12-04: qty 50

## 2019-12-04 MED ORDER — DILTIAZEM HCL 60 MG PO TABS: 30.0000 mg | ORAL_TABLET | Freq: Four times a day (QID) | ORAL | Status: DC

## 2019-12-04 MED ORDER — FUROSEMIDE 10 MG/ML IJ SOLN
20.0000 mg | Freq: Once | INTRAMUSCULAR | Status: AC
  Administered 2019-12-04: 20 mg via INTRAVENOUS
  Filled 2019-12-04: qty 4

## 2019-12-04 MED ORDER — HYDROCOD POLST-CPM POLST ER 10-8 MG/5ML PO SUER: 5.0000 mL | Freq: Two times a day (BID) | ORAL | Status: DC | PRN

## 2019-12-04 MED ORDER — ASPIRIN EC 325 MG PO TBEC: 325.0000 mg | DELAYED_RELEASE_TABLET | Freq: Every day | ORAL | Status: DC

## 2019-12-04 MED ORDER — ACETAMINOPHEN 500 MG PO TABS
1000.0000 mg | ORAL_TABLET | Freq: Once | ORAL | Status: AC
  Administered 2019-12-04: 04:00:00 1000 mg via ORAL
  Filled 2019-12-04: qty 2

## 2019-12-04 MED ORDER — FUROSEMIDE 10 MG/ML IJ SOLN: 20.0000 mg | Freq: Two times a day (BID) | INTRAMUSCULAR | Status: DC

## 2019-12-04 MED ORDER — SODIUM CHLORIDE 0.9 % IV SOLN: 250.0000 mL | INTRAVENOUS | Status: DC | PRN

## 2019-12-04 MED ORDER — ACETAMINOPHEN 325 MG PO TABS
650.0000 mg | ORAL_TABLET | ORAL | Status: DC | PRN
  Administered 2019-12-06: 650 mg via ORAL
  Filled 2019-12-04: qty 2

## 2019-12-04 MED ORDER — SODIUM CHLORIDE 0.9 % IV SOLN
1.0000 g | Freq: Once | INTRAVENOUS | Status: AC
  Administered 2019-12-04: 1 g via INTRAVENOUS
  Filled 2019-12-04: qty 10

## 2019-12-04 MED ORDER — IOHEXOL 350 MG/ML SOLN
100.0000 mL | Freq: Once | INTRAVENOUS | Status: AC | PRN
  Administered 2019-12-04: 03:00:00 100 mL via INTRAVENOUS

## 2019-12-04 MED ORDER — CARVEDILOL 6.25 MG PO TABS
6.2500 mg | ORAL_TABLET | Freq: Two times a day (BID) | ORAL | Status: DC
  Administered 2019-12-05 – 2019-12-06 (×2): 6.25 mg via ORAL
  Filled 2019-12-04 (×2): qty 1

## 2019-12-04 MED ORDER — GUAIFENESIN ER 600 MG PO TB12
600.0000 mg | ORAL_TABLET | Freq: Two times a day (BID) | ORAL | Status: DC
  Administered 2019-12-04 – 2019-12-08 (×8): 600 mg via ORAL
  Filled 2019-12-04 (×9): qty 1

## 2019-12-04 MED ORDER — SODIUM CHLORIDE 0.9 % IV SOLN
500.0000 mg | INTRAVENOUS | Status: DC
  Administered 2019-12-04: 07:00:00 500 mg via INTRAVENOUS
  Filled 2019-12-04 (×2): qty 500

## 2019-12-04 MED ORDER — HEPARIN BOLUS VIA INFUSION
1500.0000 [IU] | Freq: Once | INTRAVENOUS | Status: DC
  Filled 2019-12-04: qty 1500

## 2019-12-04 MED ORDER — HEPARIN (PORCINE) 25000 UT/250ML-% IV SOLN
1800.0000 [IU]/h | INTRAVENOUS | Status: DC
  Administered 2019-12-04: 15:00:00 1300 [IU]/h via INTRAVENOUS
  Administered 2019-12-05: 1650 [IU]/h via INTRAVENOUS
  Administered 2019-12-05: 10:00:00 1500 [IU]/h via INTRAVENOUS
  Administered 2019-12-06 – 2019-12-07 (×2): 1800 [IU]/h via INTRAVENOUS
  Filled 2019-12-04 (×5): qty 250

## 2019-12-04 NOTE — ED Notes (Signed)
EDP at bedside  

## 2019-12-04 NOTE — Progress Notes (Signed)
ANTICOAGULATION CONSULT NOTE  Pharmacy Consult for heparin Indication: atrial fibrillation  No Known Allergies  Patient Measurements: Height: 5\' 9"  (175.3 cm) Weight: 95.3 kg (210 lb) IBW/kg (Calculated) : 70.7 Heparin Dosing Weight: 90.4 kg  Vital Signs: BP: 141/99 (04/26 1315) Pulse Rate: 75 (04/26 1315)  Labs: Recent Labs    12/03/19 2152 12/04/19 0332 12/04/19 0427  HGB 14.7  --   --   HCT 39.7  --   --   PLT 262  --   --   APTT  --   --  36  LABPROT  --   --  14.9  INR  --   --  1.2  CREATININE 1.31*  --   --   TROPONINIHS 45* 44*  --     Estimated Creatinine Clearance: 68.3 mL/min (A) (by C-G formula based on SCr of 1.31 mg/dL (H)).   Medical History: Past Medical History:  Diagnosis Date  . HTN (hypertension)     Assessment: 59 year old male with afib. Patient is not on anticoagulation at this time. Pharmacy consult for heparin drip with plan to transition to DOAC prior to discharge. Patient received Lovenox 40 mg SQ at 1315.  Goal of Therapy:  Heparin level 0.3-0.7 units/ml Monitor platelets by anticoagulation protocol: Yes   Plan:  With recent administration of Lovenox, will defer heparin bolus and start drip at 1300 units/hr. HL at 2100. CBC daily while on heparin drip.  2101, PharmD 12/04/2019,2:03 PM

## 2019-12-04 NOTE — ED Provider Notes (Addendum)
[facility] Emergency Department Provider Note  ____________________________________________  Time seen: Approximately 3:37 AM  I have reviewed the triage vital signs and the nursing notes.   HISTORY  Chief Complaint Shortness of Breath and Abdominal Pain   HPI Jamel Holzmann is a 59 y.o. male with a history of smoking and hypertension  who presents for evaluation of abdominal pain.  Patient reports right upper quadrant abdominal pain that started 2 days ago.  The pain is sharp, constant and nonradiating.   Also reports that the pain is worse with deep inspiration.  He reports SOB which is worse when lying flat for several months and unchanged today. No known history of CHF.  He is a smoker.  Has had some nausea but no vomiting.  He denies fever or chills at home., diarrhea or constipation, dysuria or hematuria.  He is also complaining of a new cough for the last few days which brings up clear sputum.  No personal or family history of blood clots, no recent travel immobilization, no leg pain or swelling, no hemoptysis or exogenous hormones.  Patient is a smoker.  Past Medical History:  Diagnosis Date  . HTN (hypertension)     Patient Active Problem List   Diagnosis Date Noted  . Substance induced mood disorder (HCC) 02/22/2017  . Alcohol intoxication (HCC) 02/22/2017  . Chest pain with moderate risk for cardiac etiology 11/02/2016  . Hypertensive urgency 11/02/2016  . Tobacco abuse 11/02/2016  . Leukocytosis 11/02/2016    History reviewed. No pertinent surgical history.  Prior to Admission medications   Medication Sig Start Date End Date Taking? Authorizing Provider  aspirin EC 81 MG EC tablet Take 1 tablet (81 mg total) by mouth daily. 11/03/16   Milagros Loll, MD  lisinopril-hydrochlorothiazide (ZESTORETIC) 20-12.5 MG tablet Take 1 tablet by mouth daily. 11/02/16   Milagros Loll, MD  sulfamethoxazole-trimethoprim (BACTRIM DS) 800-160 MG tablet Take 1  tablet by mouth 2 (two) times daily. 10/12/19   Menshew, Charlesetta Ivory, PA-C    Allergies Patient has no known allergies.  Family History  Problem Relation Age of Onset  . Hypertension Mother   . Hypertension Father   . CAD Brother   . Diabetes Brother     Social History Social History   Tobacco Use  . Smoking status: Current Every Day Smoker    Packs/day: 0.50    Types: Cigarettes  . Smokeless tobacco: Never Used  Substance Use Topics  . Alcohol use: Yes    Alcohol/week: 7.0 - 14.0 standard drinks    Types: 7 - 14 Cans of beer per week    Comment: daily  . Drug use: Never    Review of Systems  Constitutional: + fever. Eyes: Negative for visual changes. ENT: Negative for sore throat. Neck: No neck pain  Cardiovascular: Negative for chest pain. Respiratory: + shortness of breath, cough Gastrointestinal: + RUQ abdominal pain. No vomiting or diarrhea. Genitourinary: Negative for dysuria. Musculoskeletal: Negative for back pain. Skin: Negative for rash. Neurological: Negative for headaches, weakness or numbness. Psych: No SI or HI  ____________________________________________   PHYSICAL EXAM:  VITAL SIGNS: ED Triage Vitals [12/03/19 2146]  Enc Vitals Group     BP (!) 174/122     Pulse Rate 70     Resp 20     Temp (!) 100.4 F (38 C)     Temp Source Oral     SpO2 96 %     Weight 210 lb (  95.3 kg)     Height  (1.753 m)     Head Circumference      Peak Flow      Pain Score 3     Pain Loc      Pain Edu?      Excl. in GC?     Constitutional: Alert and oriented. Well appearing and in no apparent distress. HEENT:      Head: Normocephalic and atraumatic.         Eyes: Conjunctivae are normal. Sclera is non-icteric.       Mouth/Throat: Mucous membranes are moist.       Neck: Supple with no signs of meningismus. Cardiovascular: Irregularly irregular rhythm with tachycardic rate Respiratory: Normal respiratory effort.Crackles on the R, clear on the  L Gastrointestinal: Soft, tender to palpation the right upper quadrant positive Murphy sign, reducible umbilical hernia. No rebound or guarding. Genitourinary: No CVA tenderness. Musculoskeletal:  No edema, cyanosis, or erythema of extremities. Neurologic: Normal speech and language. Face is symmetric. Moving all extremities. No gross focal neurologic deficits are appreciated. Skin: Skin is warm, dry and intact. No rash noted. Psychiatric: Mood and affect are normal. Speech and behavior are normal.  ____________________________________________   LABS (all labs ordered are listed, but only abnormal results are displayed)  Labs Reviewed  COMPREHENSIVE METABOLIC PANEL - Abnormal; Notable for the following components:      Result Value   Glucose, Bld 117 (*)    Creatinine, Ser 1.31 (*)    Calcium 8.4 (*)    Total Bilirubin 1.5 (*)    GFR calc non Af Amer 59 (*)    All other components within normal limits  CBC - Abnormal; Notable for the following components:   WBC 15.9 (*)    MCHC 37.0 (*)    All other components within normal limits  URINALYSIS, COMPLETE (UACMP) WITH MICROSCOPIC - Abnormal; Notable for the following components:   Hgb urine dipstick TRACE (*)    Bilirubin Urine SMALL (*)    Protein, ur 100 (*)    Bacteria, UA RARE (*)    All other components within normal limits  BRAIN NATRIURETIC PEPTIDE - Abnormal; Notable for the following components:   B Natriuretic Peptide 660.0 (*)    All other components within normal limits  TROPONIN I (HIGH SENSITIVITY) - Abnormal; Notable for the following components:   Troponin I (High Sensitivity) 45 (*)    All other components within normal limits  TROPONIN I (HIGH SENSITIVITY) - Abnormal; Notable for the following components:   Troponin I (High Sensitivity) 44 (*)    All other components within normal limits  RESPIRATORY PANEL BY RT PCR (FLU A&B, COVID)  CULTURE, BLOOD (ROUTINE X 2)  CULTURE, BLOOD (ROUTINE X 2)  URINE CULTURE   LIPASE, BLOOD  LACTIC ACID, PLASMA  APTT  PROTIME-INR  URINALYSIS, ROUTINE W REFLEX MICROSCOPIC   ____________________________________________  EKG ED ECG REPORT I, Nita Sickle, the attending physician, personally viewed and interpreted this ECG.  Atrial fibrillation with ventricular rate of 124, normal QTC, normal axis, T wave inversions in lateral leads.  A. fib is new when compared to prior.  ____________________________________________  RADIOLOGY  I have personally reviewed the images performed during this visit and I agree with the Radiologist's read.   Interpretation by Radiologist:  DG Chest 2 View  Result Date: 12/03/2019 CLINICAL DATA:  Initial evaluation for acute shortness of breath. EXAM: CHEST - 2 VIEW COMPARISON:  Prior radiograph from 11/02/2016. FINDINGS:  Mild cardiomegaly.  Mediastinal silhouette within normal limits. Lungs normally inflated. Perihilar vascular congestion with mild diffuse interstitial prominence, suggesting pulmonary interstitial congestion. Probable trace bilateral pleural effusions. Suspected underlying COPD. No consolidative opacity. No pneumothorax. No acute osseous finding. IMPRESSION: 1. Cardiomegaly with mild diffuse pulmonary interstitial congestion and trace bilateral pleural effusions. 2. Suspected underlying COPD. Electronically Signed   By: Rise Mu M.D.   On: 12/03/2019 22:21   CT Angio Chest PE W and/or Wo Contrast  Result Date: 12/04/2019 CLINICAL DATA:  Abdominal pain and shortness of breath EXAM: CT ANGIOGRAPHY CHEST WITH CONTRAST TECHNIQUE: Multidetector CT imaging of the chest was performed using the standard protocol during bolus administration of intravenous contrast. Multiplanar CT image reconstructions and MIPs were obtained to evaluate the vascular anatomy. CONTRAST:  OMNIPAQUE IOHEXOL 350 MG/ML SOLN COMPARISON:  None. FINDINGS: Cardiovascular: There is a optimal opacification of the pulmonary arteries.  There is no central,segmental, or subsegmental filling defects within the pulmonary arteries. There is mild cardiomegaly. Trace pericardial fusion is seen. Coronary artery calcifications are seen. No pericardial effusion or thickening. No evidence right heart strain. There is normal three-vessel brachiocephalic anatomy without proximal stenosis. Scattered mild aortic atherosclerosis is seen. Mediastinum/Nodes: No hilar, mediastinal, or axillary adenopathy. Scattered small prevascular and pretracheal lymph nodes are present. Thyroid gland, trachea, and esophagus demonstrate no significant findings. Lungs/Pleura: Small bilateral pleural effusions are present, right greater than. Mild hazy ground-glass opacity seen throughout both lungs. No large airspace consolidation. Musculoskeletal: No chest wall abnormality. No acute or significant osseous findings. Review of the MIP images confirms the above findings. Abdomen/pelvis: Hepatobiliary: The liver is normal in density without focal abnormality.The main portal vein is patent. There appears to be a small amount of pericholecystic fluid with mild hyperenhancement of the gallbladder wall. No calcified gallstones are present. No intrahepatic biliary ductal dilatation. There are fat stranding changes seen around the gallbladder fundus. A small amount perihepatic fat stranding changes are also noted which track into the right pericolic gutter. Pancreas: Unremarkable. No pancreatic ductal dilatation or surrounding inflammatory changes. Spleen: Normal in size without focal abnormality. Adrenals/Urinary Tract: Both adrenal glands appear normal. The kidneys and collecting system appear normal without evidence of urinary tract calculus or hydronephrosis. Bladder is unremarkable. Stomach/Bowel: The stomach, small bowel, and colon are normal in appearance. No inflammatory changes, wall thickening, or obstructive findings.The appendix is normal. Vascular/Lymphatic: There are no  enlarged mesenteric, retroperitoneal, or pelvic lymph nodes. Scattered aortic atherosclerotic calcifications are seen without aneurysmal dilatation. Reproductive: The prostate is unremarkable. Other: No evidence of abdominal wall mass or hernia. Musculoskeletal: No acute or significant osseous findings. Large subchondral cystic changes are seen at the medial aspect of the bilateral femoral heads. Ankylosis seen at the left superior sacroiliac joint. IMPRESSION: 1. No central, segmental, or subsegmental pulmonary embolism. 2. Small bilateral pleural effusions, right greater than left. 3. Mild cardiomegaly. 4. Trace pericardial effusion 5. Small amount of pericholecystic fluid with surrounding fat stranding changes which could be due to acalculous cholecystitis. 6. Mild amount of inflammatory stranding changes tracking into the right pericolic gutter. 7.  Aortic Atherosclerosis (ICD10-I70.0). Electronically Signed   By: Jonna Clark M.D.   On: 12/04/2019 02:59   CT ABDOMEN PELVIS W CONTRAST  Result Date: 12/04/2019 CLINICAL DATA:  Abdominal pain and shortness of breath EXAM: CT ANGIOGRAPHY CHEST WITH CONTRAST TECHNIQUE: Multidetector CT imaging of the chest was performed using the standard protocol during bolus administration of intravenous contrast. Multiplanar CT image reconstructions and MIPs were  obtained to evaluate the vascular anatomy. CONTRAST:  OMNIPAQUE IOHEXOL 350 MG/ML SOLN COMPARISON:  None. FINDINGS: Cardiovascular: There is a optimal opacification of the pulmonary arteries. There is no central,segmental, or subsegmental filling defects within the pulmonary arteries. There is mild cardiomegaly. Trace pericardial fusion is seen. Coronary artery calcifications are seen. No pericardial effusion or thickening. No evidence right heart strain. There is normal three-vessel brachiocephalic anatomy without proximal stenosis. Scattered mild aortic atherosclerosis is seen. Mediastinum/Nodes: No hilar,  mediastinal, or axillary adenopathy. Scattered small prevascular and pretracheal lymph nodes are present. Thyroid gland, trachea, and esophagus demonstrate no significant findings. Lungs/Pleura: Small bilateral pleural effusions are present, right greater than. Mild hazy ground-glass opacity seen throughout both lungs. No large airspace consolidation. Musculoskeletal: No chest wall abnormality. No acute or significant osseous findings. Review of the MIP images confirms the above findings. Abdomen/pelvis: Hepatobiliary: The liver is normal in density without focal abnormality.The main portal vein is patent. There appears to be a small amount of pericholecystic fluid with mild hyperenhancement of the gallbladder wall. No calcified gallstones are present. No intrahepatic biliary ductal dilatation. There are fat stranding changes seen around the gallbladder fundus. A small amount perihepatic fat stranding changes are also noted which track into the right pericolic gutter. Pancreas: Unremarkable. No pancreatic ductal dilatation or surrounding inflammatory changes. Spleen: Normal in size without focal abnormality. Adrenals/Urinary Tract: Both adrenal glands appear normal. The kidneys and collecting system appear normal without evidence of urinary tract calculus or hydronephrosis. Bladder is unremarkable. Stomach/Bowel: The stomach, small bowel, and colon are normal in appearance. No inflammatory changes, wall thickening, or obstructive findings.The appendix is normal. Vascular/Lymphatic: There are no enlarged mesenteric, retroperitoneal, or pelvic lymph nodes. Scattered aortic atherosclerotic calcifications are seen without aneurysmal dilatation. Reproductive: The prostate is unremarkable. Other: No evidence of abdominal wall mass or hernia. Musculoskeletal: No acute or significant osseous findings. Large subchondral cystic changes are seen at the medial aspect of the bilateral femoral heads. Ankylosis seen at the left  superior sacroiliac joint. IMPRESSION: 1. No central, segmental, or subsegmental pulmonary embolism. 2. Small bilateral pleural effusions, right greater than left. 3. Mild cardiomegaly. 4. Trace pericardial effusion 5. Small amount of pericholecystic fluid with surrounding fat stranding changes which could be due to acalculous cholecystitis. 6. Mild amount of inflammatory stranding changes tracking into the right pericolic gutter. 7.  Aortic Atherosclerosis (ICD10-I70.0). Electronically Signed   By: Jonna Clark M.D.   On: 12/04/2019 02:59   US ABDOMEN LIMITED RUQ  Result Date: 12/04/2019 CLINICAL DATA:  Initial evaluation for acute right upper quadrant pain. EXAM: ULTRASOUND ABDOMEN LIMITED RIGHT UPPER QUADRANT COMPARISON:  None. FINDINGS: Gallbladder: No stones or sludge seen within the gallbladder lumen. Gallbladder wall measures at the upper limits of normal at 3 mm. No free pericholecystic fluid. A positive sonographic Murphy sign was elicited on exam. Common bile duct: Diameter: 2.9 mm Liver: No focal lesion identified. Within normal limits in parenchymal echogenicity. Portal vein is patent on color Doppler imaging with normal direction of blood flow towards the liver. Other: None. IMPRESSION: 1. Positive sonographic Murphy sign without cholelithiasis or other features for acute cholecystitis. 2. No biliary dilatation. Electronically Signed   By: Rise Mu M.D.   On: 12/04/2019 03:58     ____________________________________________   PROCEDURES  Procedure(s) performed:yes .1-3 Lead EKG Interpretation Performed by: Nita Sickle, MD Authorized by: Nita Sickle, MD     Interpretation: abnormal     ECG rate:  120   ECG rate assessment:  tachycardic     Rhythm: atrial fibrillation     Ectopy: none     Conduction: normal     Critical Care performed: yes  CRITICAL CARE Performed by: Rudene Re  ?  Total critical care time: 45 min  Critical care time was  exclusive of separately billable procedures and treating other patients.  Critical care was necessary to treat or prevent imminent or life-threatening deterioration.  Critical care was time spent personally by me on the following activities: development of treatment plan with patient and/or surrogate as well as nursing, discussions with consultants, evaluation of patient's response to treatment, examination of patient, obtaining history from patient or surrogate, ordering and performing treatments and interventions, ordering and review of laboratory studies, ordering and review of radiographic studies, pulse oximetry and re-evaluation of patient's condition.  ____________________________________________   INITIAL IMPRESSION / ASSESSMENT AND PLAN / ED COURSE  59 y.o. male with a history of smoking and hypertension  who presents for evaluation of RUQ abdominal pain.  Patient is in A. fib with RVR with rate between 110 and 120s.  This is new for him.  He has a temp of 100.4, tachypneic with crackles on the right side, abdomen is tender to palpation the right upper quadrant with positive Murphy sign.  Differential diagnosis including cholecystitis, ascending cholangitis, symptomatic cholelithiasis, kidney stone, pancreatitis, appendicitis, diverticulitis, pneumonia, PE, CHF.  Review of old medical records done.  Labs showing normal LFTs and lipase, leukocytosis with white count of 15.9, elevated BNP of 660 and elevated troponin of 45.  CT angio visualized show no evidence of PE but positive for groundglass opacities on bilateral lungs, also showing bilateral pleural effusions, mild cardiomegaly, trace pericardial effusion, and fat stranding around the gallbladder.  Right upper quadrant ultrasound is pending.  Read confirmed by radiology. COVID pending.  IV mag and PO cardizem given for rate control.   _________________________ 4:07 AM on 12/04/2019 -----------------------------------------  CT  consistent with CHF but also with fever and elevated white count and groundglass opacity on CT and no other source of infection we will treat for community-acquired pneumonia with Rocephin and doxycycline.  Covid and flu negative.  Will give Lasix IV.  Right upper quadrant ultrasound negative for cholecystitis, confirmed by radiology.  Will discuss with the hospitalist for admission.     _____________________________________________ Please note:  Patient was evaluated in Emergency Department today for the symptoms described in the history of present illness. Patient was evaluated in the context of the global COVID-19 pandemic, which necessitated consideration that the patient might be at risk for infection with the SARS-CoV-2 virus that causes COVID-19. Institutional protocols and algorithms that pertain to the evaluation of patients at risk for COVID-19 are in a state of rapid change based on information released by regulatory bodies including the CDC and federal and state organizations. These policies and algorithms were followed during the patient's care in the ED.  Some ED evaluations and interventions may be delayed as a result of limited staffing during the pandemic.   [place] Controlled Substance Database was reviewed by me. ____________________________________________   FINAL CLINICAL IMPRESSION(S) / ED DIAGNOSES   Final diagnoses:  RUQ abdominal pain  Atrial fibrillation with RVR ([place])  Community acquired pneumonia, unspecified laterality  New onset of congestive heart failure ([place])  Demand ischemia ([place])      NEW MEDICATIONS STARTED DURING THIS VISIT:  ED Discharge Orders    None       Note:  This document was prepared  using Conservation officer, historic buildings and may include unintentional dictation errors.    Don Perking, [place], MD 12/04/19 3016    Nita Sickle, MD 12/04/19 [phone]

## 2019-12-04 NOTE — TOC Initial Note (Signed)
Transition of Care [facility]) - Initial/Assessment Note    Patient Details  Name: Gregory Howell MRN: [id number] Date of Birth: April 27, 1961  [facility] of [facility]) CM/SW Contact:    Forest Cellar, RN Phone Number: 12/04/2019, 2:23 PM  Clinical Narrative:                 Spoke with patients mother in law who was calling to check on patient. States patient is independent in his care and continues to work and drive himself around. No DME and no concerns for discharge at this time. Spouse does not work outside the home and is available to assist as needed.   Expected Discharge Plan: Home/Self Care     Patient Goals and CMS Choice Patient states their goals for this hospitalization and ongoing recovery are:: Get feeling better      Expected Discharge Plan and Services Expected Discharge Plan: Home/Self Care       Living arrangements for the past 2 months: Single Family Home                                      Prior Living Arrangements/Services Living arrangements for the past 2 months: Single Family Home Lives with:: Spouse Patient language and need for interpreter reviewed:: Yes Do you feel safe going back to the place where you live?: Yes      Need for Family Participation in Patient Care: No (Comment) Care giver support system in place?: Yes (comment)   Criminal Activity/Legal Involvement Pertinent to Current Situation/Hospitalization: No - Comment as needed  Activities of Daily Living      Permission Sought/Granted Permission sought to share information with : Case Manager Permission granted to share information with : Yes, Verbal Permission Granted  Share Information with NAME: TOC Department           Emotional Assessment Appearance:: Appears stated age Attitude/Demeanor/Rapport: Ambitious, Engaged Affect (typically observed): Accepting Orientation: : Oriented to Self, Oriented to Place, Oriented to  Time, Oriented to Situation Alcohol / Substance  Use: Tobacco Use, Alcohol Use Psych Involvement: No (comment)  Admission diagnosis:  CAP (community acquired pneumonia) [J18.9] Patient Active Problem List   Diagnosis Date Noted  . CAP (community acquired pneumonia) 12/04/2019  . Substance induced mood disorder (HCC) 02/22/2017  . Alcohol intoxication (HCC) 02/22/2017  . Chest pain with moderate risk for cardiac etiology 11/02/2016  . Hypertensive urgency 11/02/2016  . Tobacco abuse 11/02/2016  . Leukocytosis 11/02/2016   PCP:  [facility]:   [facility] [address] ([address]), [place] - [address] [place]) [place] [address] Phone: [phone] Fax: [phone]     Social Determinants of Health (SDOH) Interventions    Readmission Risk Interventions No flowsheet data found.

## 2019-12-04 NOTE — ED Notes (Signed)
Pt in CT.

## 2019-12-04 NOTE — ED Notes (Signed)
Pt given meal tray from dietary at this time.  

## 2019-12-04 NOTE — Consult Note (Signed)
Cardiology Consultation:   Patient ID: Regina Coppolino MRN: [id number]; DOB: [birth year]  Admit date: 12/04/2019 Date of Consult: 12/04/2019  Primary Care Provider: [facility] Primary Cardiologist: [facility], Dr. Kirke Corin Primary Electrophysiologist:  None    Patient Profile:   Kerrigan Glendening is a 59 y.o. male with a hx of hypertension, tobacco use, previously documented medication noncompliance, h/o supply demand ischemia/chest pain, and who is being seen today for the evaluation of acute heart failure at the request of Dr. Arville Care.  History of Present Illness:   Mr. Reece is a 59 year old male with PMH as above.  He was previously consulted on by [facility] cardiology in 2018 for chest pain thought likely due to supply demand ischemia.    He has a long history of tobacco use.  He also has family history of cardiac disease with his brother having his first MI at the age of 16.  He also reports that both of his parents had heart failure but is unable to recall the ages.  He denies a history of diabetes; however, he does report a "light stroke," though as reported by EMS one time ~1990s. He denies a history of arrhythmia.   He is a current smoker, stating that 1 pack can last him 3 days.  He reports drinking between 12 to 24 ounces of beer daily.  He denies any illegal drug use.  He has reportedly been attempting to cut back on salt and eat more healthy.  He does not have a regular workout routine.  [facility] and [facility] that [facility] has previously had an echocardiogram via the [place] but does not recall the results.  On 12/03/2019, he presented to the emergency room at [facility] with shortness of breath, orthopnea, PND, early satiety, and cough producing clear sputum.  He reported progressive PND and orthopnea over the last several months, sleeping in his recliner for over a month due to his orthopnea. No CP, racing HR, or palpitations. Within the last few days, he also noted abdominal  distention, right flank to upper quadrant pain, and early satiety.  He denied LEE. He reported that the right upper quadrant pain was made worse with deeper breathing.    In the ED, labs significant for potassium 4.3, creatinine 1.31, BUN 12, AST 27, ALT 43, Ca++ 8.4, sodium 136, albumin 3.8, BNP 660.0, high-sensitivity troponin 45 (peak)  44, hemoglobin 14.7, WBC elevated at 15.9, and lactic acid 1.4. EKG showed new onset atrial tachycardia without any acute ST or T changes.  Respiratory panel performed negative for COVID-19 and Influenza A/B. UA with trace protein. Alcohol detected but otherwise tox screen negative.  Blood cultures pending.  Most recent vitals with T 100.6F. He has been started on antibiotics and lasix in the ED. CTA chest showed no acute pulmonary embolism and small bilateral pleural effusions with right greater than left, as well as mild cardiomegaly with trace pericardial effusion.  A small amount of pericholecystic fluid with surrounding fat stranding changes which could be due to a calculus cholecystitis was also noted, as well as mild inflammatory changes tracking into the right pericolic gutter.  Aortic atherosclerosis was also noted.  Chest x-ray showed mild diffuse pulmonary interstitial congestion and trace bilateral pleural effusions. COPD was noted to be suspected. Echo pending.  Since presenting to the hospital, he has felt alleviation of his right flank and RUQ pain but notes ongoing orthopnea and SOB.  Heart Pathway Score:  Past Medical History:  Diagnosis Date  . HTN (hypertension)     History reviewed. No pertinent surgical history.   Home Medications:  Prior to Admission medications   Medication Sig Start Date End Date Taking? Authorizing Provider  aspirin EC 81 MG EC tablet Take 1 tablet (81 mg total) by mouth daily. Patient not taking: Reported on 12/04/2019 11/03/16   Milagros Loll, MD  lisinopril-hydrochlorothiazide (ZESTORETIC) 20-12.5 MG tablet Take 1  tablet by mouth daily. Patient not taking: Reported on 12/04/2019 11/02/16   Milagros Loll, MD  sulfamethoxazole-trimethoprim (BACTRIM DS) 800-160 MG tablet Take 1 tablet by mouth 2 (two) times daily. Patient not taking: Reported on 12/04/2019 10/12/19   Menshew, Charlesetta Ivory, PA-C    Inpatient Medications: Scheduled Meds: . [START ON 12/05/2019] aspirin EC  325 mg Oral Daily  . diltiazem  120 mg Oral Daily  . enoxaparin (LOVENOX) injection  40 mg Subcutaneous Q24H  . furosemide  20 mg Intravenous Q12H  . guaiFENesin  600 mg Oral BID  . sodium chloride flush  3 mL Intravenous Q12H   Continuous Infusions: . sodium chloride    . azithromycin Stopped (12/04/19 0740)  . cefTRIAXone (ROCEPHIN)  IV     PRN Meds: sodium chloride, acetaminophen, chlorpheniramine-HYDROcodone, labetalol, ondansetron (ZOFRAN) IV, sodium chloride flush  Allergies:   No Known Allergies  Social History:   Social History   Socioeconomic History  . Marital status: Married    Spouse name: Not on file  . Number of children: Not on file  . Years of education: Not on file  . Highest education level: Not on file  Occupational History  . Not on file  Tobacco Use  . Smoking status: Current Every Day Smoker    Packs/day: 0.50    Types: Cigarettes  . Smokeless tobacco: Never Used  Substance and Sexual Activity  . Alcohol use: Yes    Alcohol/week: 7.0 - 14.0 standard drinks    Types: 7 - 14 Cans of beer per week    Comment: daily  . Drug use: Never  . Sexual activity: Not on file  Other Topics Concern  . Not on file  Social History Narrative  . Not on file   Social Determinants of Health   Financial Resource Strain:   . Difficulty of Paying Living Expenses:   Food Insecurity:   . Worried About Programme researcher, broadcasting/film/video in the Last Year:   . Barista in the Last Year:   Transportation Needs:   . Freight forwarder (Medical):   Marland Kitchen Lack of Transportation (Non-Medical):   Physical Activity:   .  Days of Exercise per Week:   . Minutes of Exercise per Session:   Stress:   . Feeling of Stress :   Social Connections:   . Frequency of Communication with Friends and Family:   . Frequency of Social Gatherings with Friends and Family:   . Attends Religious Services:   . Active Member of Clubs or Organizations:   . Attends Banker Meetings:   Marland Kitchen Marital Status:   Intimate Partner Violence:   . Fear of Current or Ex-Partner:   . Emotionally Abused:   Marland Kitchen Physically Abused:   . Sexually Abused:     Family History:    Family History  Problem Relation Age of Onset  . Hypertension Mother   . Hypertension Father   . CAD Brother   . Diabetes Brother      ROS:  Please  see the history of present illness.  Review of Systems  Constitutional: Positive for malaise/fatigue.  Respiratory: Positive for cough and shortness of breath. Negative for hemoptysis.   Cardiovascular: Positive for orthopnea and PND. Negative for chest pain, palpitations and leg swelling.  Gastrointestinal: Positive for nausea. Negative for blood in stool, melena and vomiting.  Genitourinary: Negative for hematuria.  Musculoskeletal: Negative for falls.  Neurological: Negative for focal weakness and weakness.  All other systems reviewed and are negative.   All other ROS reviewed and negative.     Physical Exam/Data:   Vitals:   12/04/19 0407 12/04/19 0430 12/04/19 0530 12/04/19 0600  BP: (!) 166/123 (!) 170/83 (!) 148/109 (!) 135/104  Pulse:  (!) 57 90 94  Resp:  20  14  Temp:      TempSrc:      SpO2:  91% 92% 94%  Weight:      Height:        Intake/Output Summary (Last 24 hours) at 12/04/2019 1231 Last data filed at 12/04/2019 0457 Gross per 24 hour  Intake 150 ml  Output -  Net 150 ml   Last 3 Weights 12/03/2019 10/31/2019 10/17/2019  Weight (lbs) 210 lb 200 lb 9.9 oz 200 lb 9.9 oz  Weight (kg) 95.255 kg 91 kg 91 kg     Body mass index is 31.01 kg/m.  General:  Well nourished, well  developed, in no acute distress HEENT: normal Neck: JVD difficult to assess due to patient position and body habitus Vascular: No carotid bruits; radial pulses 2+ bilaterally Cardiac:  normal S1, S2; IRIR; no murmur  Lungs:  Bibasilar reduced breath sounds, R base crackles, no wheezing, rhonchi or rales  Abd: soft, nontender, no hepatomegaly  Ext: mild bilateral LEE Musculoskeletal:  No deformities, BUE and BLE strength normal and equal Skin: warm and dry  Neuro:  No focal abnormalities noted Psych:  Normal affect   EKG:  The EKG was personally reviewed and demonstrates:  Atrial fibrillation versus atrial flutter with variable block Telemetry:  Telemetry was personally reviewed and demonstrates:  Atrial fibrillation versus atrial flutter with variable block, ventricular rate 124bpm, LVH  Relevant CV Studies: Pending echo  Laboratory Data:  High Sensitivity Troponin:   Recent Labs  Lab 12/03/19 2152 12/04/19 0332  TROPONINIHS 45* 44*     Cardiac EnzymesNo results for input(s): TROPONINI in the last 168 hours. No results for input(s): TROPIPOC in the last 168 hours.  Chemistry Recent Labs  Lab 12/03/19 2152  NA 136  K 4.3  CL 104  CO2 25  GLUCOSE 117*  BUN 12  CREATININE 1.31*  CALCIUM 8.4*  GFRNONAA 59*  GFRAA >60  ANIONGAP 7    Recent Labs  Lab 12/03/19 2152  PROT 7.3  ALBUMIN 3.8  AST 27  ALT 43  ALKPHOS 122  BILITOT 1.5*   Hematology Recent Labs  Lab 12/03/19 2152  WBC 15.9*  RBC 4.95  HGB 14.7  HCT 39.7  MCV 80.2  MCH 29.7  MCHC 37.0*  RDW 15.1  PLT 262   BNP Recent Labs  Lab 12/03/19 2152  BNP 660.0*    DDimer No results for input(s): DDIMER in the last 168 hours.   Radiology/Studies:  DG Chest 2 View  Result Date: 12/03/2019 CLINICAL DATA:  Initial evaluation for acute shortness of breath. EXAM: CHEST - 2 VIEW COMPARISON:  Prior radiograph from 11/02/2016. FINDINGS: Mild cardiomegaly.  Mediastinal silhouette within normal limits.  Lungs normally inflated. Perihilar vascular congestion  with mild diffuse interstitial prominence, suggesting pulmonary interstitial congestion. Probable trace bilateral pleural effusions. Suspected underlying COPD. No consolidative opacity. No pneumothorax. No acute osseous finding. IMPRESSION: 1. Cardiomegaly with mild diffuse pulmonary interstitial congestion and trace bilateral pleural effusions. 2. Suspected underlying COPD. Electronically Signed   By: Rise Mu M.D.   On: 12/03/2019 22:21   CT Angio Chest PE W and/or Wo Contrast  Result Date: 12/04/2019 CLINICAL DATA:  Abdominal pain and shortness of breath EXAM: CT ANGIOGRAPHY CHEST WITH CONTRAST TECHNIQUE: Multidetector CT imaging of the chest was performed using the standard protocol during bolus administration of intravenous contrast. Multiplanar CT image reconstructions and MIPs were obtained to evaluate the vascular anatomy. CONTRAST:  OMNIPAQUE IOHEXOL 350 MG/ML SOLN COMPARISON:  None. FINDINGS: Cardiovascular: There is a optimal opacification of the pulmonary arteries. There is no central,segmental, or subsegmental filling defects within the pulmonary arteries. There is mild cardiomegaly. Trace pericardial fusion is seen. Coronary artery calcifications are seen. No pericardial effusion or thickening. No evidence right heart strain. There is normal three-vessel brachiocephalic anatomy without proximal stenosis. Scattered mild aortic atherosclerosis is seen. Mediastinum/Nodes: No hilar, mediastinal, or axillary adenopathy. Scattered small prevascular and pretracheal lymph nodes are present. Thyroid gland, trachea, and esophagus demonstrate no significant findings. Lungs/Pleura: Small bilateral pleural effusions are present, right greater than. Mild hazy ground-glass opacity seen throughout both lungs. No large airspace consolidation. Musculoskeletal: No chest wall abnormality. No acute or significant osseous findings. Review of the  MIP images confirms the above findings. Abdomen/pelvis: Hepatobiliary: The liver is normal in density without focal abnormality.The main portal vein is patent. There appears to be a small amount of pericholecystic fluid with mild hyperenhancement of the gallbladder wall. No calcified gallstones are present. No intrahepatic biliary ductal dilatation. There are fat stranding changes seen around the gallbladder fundus. A small amount perihepatic fat stranding changes are also noted which track into the right pericolic gutter. Pancreas: Unremarkable. No pancreatic ductal dilatation or surrounding inflammatory changes. Spleen: Normal in size without focal abnormality. Adrenals/Urinary Tract: Both adrenal glands appear normal. The kidneys and collecting system appear normal without evidence of urinary tract calculus or hydronephrosis. Bladder is unremarkable. Stomach/Bowel: The stomach, small bowel, and colon are normal in appearance. No inflammatory changes, wall thickening, or obstructive findings.The appendix is normal. Vascular/Lymphatic: There are no enlarged mesenteric, retroperitoneal, or pelvic lymph nodes. Scattered aortic atherosclerotic calcifications are seen without aneurysmal dilatation. Reproductive: The prostate is unremarkable. Other: No evidence of abdominal wall mass or hernia. Musculoskeletal: No acute or significant osseous findings. Large subchondral cystic changes are seen at the medial aspect of the bilateral femoral heads. Ankylosis seen at the left superior sacroiliac joint. IMPRESSION: 1. No central, segmental, or subsegmental pulmonary embolism. 2. Small bilateral pleural effusions, right greater than left. 3. Mild cardiomegaly. 4. Trace pericardial effusion 5. Small amount of pericholecystic fluid with surrounding fat stranding changes which could be due to acalculous cholecystitis. 6. Mild amount of inflammatory stranding changes tracking into the right pericolic gutter. 7.  Aortic  Atherosclerosis (ICD10-I70.0). Electronically Signed   By: Jonna Clark M.D.   On: 12/04/2019 02:59   CT ABDOMEN PELVIS W CONTRAST  Result Date: 12/04/2019 CLINICAL DATA:  Abdominal pain and shortness of breath EXAM: CT ANGIOGRAPHY CHEST WITH CONTRAST TECHNIQUE: Multidetector CT imaging of the chest was performed using the standard protocol during bolus administration of intravenous contrast. Multiplanar CT image reconstructions and MIPs were obtained to evaluate the vascular anatomy. CONTRAST:  OMNIPAQUE IOHEXOL 350 MG/ML SOLN  COMPARISON:  None. FINDINGS: Cardiovascular: There is a optimal opacification of the pulmonary arteries. There is no central,segmental, or subsegmental filling defects within the pulmonary arteries. There is mild cardiomegaly. Trace pericardial fusion is seen. Coronary artery calcifications are seen. No pericardial effusion or thickening. No evidence right heart strain. There is normal three-vessel brachiocephalic anatomy without proximal stenosis. Scattered mild aortic atherosclerosis is seen. Mediastinum/Nodes: No hilar, mediastinal, or axillary adenopathy. Scattered small prevascular and pretracheal lymph nodes are present. Thyroid gland, trachea, and esophagus demonstrate no significant findings. Lungs/Pleura: Small bilateral pleural effusions are present, right greater than. Mild hazy ground-glass opacity seen throughout both lungs. No large airspace consolidation. Musculoskeletal: No chest wall abnormality. No acute or significant osseous findings. Review of the MIP images confirms the above findings. Abdomen/pelvis: Hepatobiliary: The liver is normal in density without focal abnormality.The main portal vein is patent. There appears to be a small amount of pericholecystic fluid with mild hyperenhancement of the gallbladder wall. No calcified gallstones are present. No intrahepatic biliary ductal dilatation. There are fat stranding changes seen around the gallbladder fundus. A  small amount perihepatic fat stranding changes are also noted which track into the right pericolic gutter. Pancreas: Unremarkable. No pancreatic ductal dilatation or surrounding inflammatory changes. Spleen: Normal in size without focal abnormality. Adrenals/Urinary Tract: Both adrenal glands appear normal. The kidneys and collecting system appear normal without evidence of urinary tract calculus or hydronephrosis. Bladder is unremarkable. Stomach/Bowel: The stomach, small bowel, and colon are normal in appearance. No inflammatory changes, wall thickening, or obstructive findings.The appendix is normal. Vascular/Lymphatic: There are no enlarged mesenteric, retroperitoneal, or pelvic lymph nodes. Scattered aortic atherosclerotic calcifications are seen without aneurysmal dilatation. Reproductive: The prostate is unremarkable. Other: No evidence of abdominal wall mass or hernia. Musculoskeletal: No acute or significant osseous findings. Large subchondral cystic changes are seen at the medial aspect of the bilateral femoral heads. Ankylosis seen at the left superior sacroiliac joint. IMPRESSION: 1. No central, segmental, or subsegmental pulmonary embolism. 2. Small bilateral pleural effusions, right greater than left. 3. Mild cardiomegaly. 4. Trace pericardial effusion 5. Small amount of pericholecystic fluid with surrounding fat stranding changes which could be due to acalculous cholecystitis. 6. Mild amount of inflammatory stranding changes tracking into the right pericolic gutter. 7.  Aortic Atherosclerosis (ICD10-I70.0). Electronically Signed   By: Prudencio Pair M.D.   On: 12/04/2019 02:59   US ABDOMEN LIMITED RUQ  Result Date: 12/04/2019 CLINICAL DATA:  Initial evaluation for acute right upper quadrant pain. EXAM: ULTRASOUND ABDOMEN LIMITED RIGHT UPPER QUADRANT COMPARISON:  None. FINDINGS: Gallbladder: No stones or sludge seen within the gallbladder lumen. Gallbladder wall measures at the upper limits of normal  at 3 mm. No free pericholecystic fluid. A positive sonographic Murphy sign was elicited on exam. Common bile duct: Diameter: 2.9 mm Liver: No focal lesion identified. Within normal limits in parenchymal echogenicity. Portal vein is patent on color Doppler imaging with normal direction of blood flow towards the liver. Other: None. IMPRESSION: 1. Positive sonographic Murphy sign without cholelithiasis or other features for acute cholecystitis. 2. No biliary dilatation. Electronically Signed   By: Jeannine Boga M.D.   On: 12/04/2019 03:58    Assessment and Plan:   Acute on chronic heart failure (?50%, EF currently unknown) --Consider SOB as likely multifactorial with possible COPD/long history of smoking and current leukocytosis with elevated WBC. Pending blood cultures.  --Slightly volume up; however, recommend caution with lasix given sepsis protocol and diuretic naiive. BNP 660.0.  --Previous 2018 [place]  study showed small defect of mild severity in the apex and likely 2/2 apical thinning but ruled low risk study. It was noted EF was estimated at 50% or low normal with mild LAE. Pending updated echo. HS Tn not consistent with ACS and no CP. --Continue to monitor I/O, daily weights. ED Wt 210lbs or 95.3kg. Net +150cc. --Pending echo to assess EF, r/o structural change or WMA. Changed readers to [facility].  --Started Coreg in lieu of diltiazem, given current EF unknown. Titrate as needed for HR and BP control. Daily BMET, CBC. Ordered a BMET for today. Further recommendations pending echo.  Atrial fibrillation / flutter with variable block --Asx. Chronicity unknown. Consider as triggered by current illness.  --Rate controlled at this time. As above, will transition to Coreg as current EF unknown thus will discontinue diltiazem. --Started Coreg. Would caution against ongoing diltiazem given EF unknown at this time. Recommend Coreg, as this will also assist with BP. --CHA2DS2VASc score of at least 3 (HTN,  vascular, CHF) with recommendation for long term anticoagulation. Given his TIA is EMS reported, will defer from calculating in score at this time. Pending A1C for further risk stratification.  --Will start on heparin per pharmacy consult for now with plan to transition to [facility] at discharge. Discussed this with patient.  --TSH pending, monitor electrolytes. Hgb stable. --Consider outpatient sleep study. Recommend alcohol cessation.   Leukocytosis --Continue abx, blood cultures pending, per IM. Currently febrile at 100.4. Consider infection as trigger for Afib.   HTN --Titrate antihypertensives as needed. Changed diltiazem to Coreg given current EF unknown. Caution with ACE/ARB/ARNI given bump in renal function.  Tobacco use --Cessation advised   For questions or updates, please contact [facility] Please consult [email or web address] for contact info under     Signed, Lennon Alstrom, PA-C  12/04/2019 12:31 PM

## 2019-12-04 NOTE — H&P (Addendum)
[facility] [place] NAME: Gregory Howell    MR#:  [id number]  DATE OF BIRTH:  04-08-1961  DATE OF ADMISSION:  12/04/2019  PRIMARY CARE PHYSICIAN: [facility]   REQUESTING/REFERRING PHYSICIAN: Gonzella Lex, MD  CHIEF COMPLAINT:   Chief Complaint  Patient presents with  . Shortness of Breath  . Abdominal Pain    HISTORY OF PRESENT ILLNESS:  Gregory Howell  is a 59 y.o. African-American male with a known history of hypertension, who presented to the emergency room with acute onset of dyspnea with associated dry cough and occasional wheezing with no reported fever or chills or nausea or vomiting or diarrhea or abdominal pain.  He has been having right-sided chest pain increasing with cough.  No palpitations.  No headache or dizziness or blurred vision.  No dysuria, oliguria or hematuria or flank pain.  Upon presentation to the emergency room, blood pressure was 174/122 with 100.4 temperature and otherwise normal vital signs.  Labs revealed a BUN of 12 and creatinine 1.31 compared to 14 and 1.19 on 02/22/2017.  BNP was elevated at 660, troponin I 45 then 44 and lactic acid 1.4 with CBC showing leukocytosis of 15.9.  Blood culture was ordered.  Chest x-ray showed cardiomegaly with mild diffuse pulmonary tissue congestion and trace bilateral pleural effusions as well as suspected underlying COPD.  EKG showed atrial fibrillation with rapid ventricular response of 124 with poor R wave progression.  The patient was given IV Rocephin and doxycycline, p.o. Tylenol, Cardizem CD, 20 g IV Lasix and 2 g of IV magnesium sulfate.  He will be admitted to a medically monitored bed for further evaluation and management. PAST MEDICAL HISTORY:   Past Medical History:  Diagnosis Date  . HTN (hypertension)     PAST SURGICAL HISTORY:  History reviewed. No pertinent surgical history.  SOCIAL HISTORY:   Social History   Tobacco Use  . Smoking status: Current Every  Day Smoker    Packs/day: 0.50    Types: Cigarettes  . Smokeless tobacco: Never Used  Substance Use Topics  . Alcohol use: Yes    Alcohol/week: 7.0 - 14.0 standard drinks    Types: 7 - 14 Cans of beer per week    Comment: daily    FAMILY HISTORY:   Family History  Problem Relation Age of Onset  . Hypertension Mother   . Hypertension Father   . CAD Brother   . Diabetes Brother     DRUG ALLERGIES:  No Known Allergies  REVIEW OF SYSTEMS:   ROS As per history of present illness. All pertinent systems were reviewed above. Constitutional,  HEENT, cardiovascular, respiratory, GI, GU, musculoskeletal, neuro, psychiatric, endocrine,  integumentary and hematologic systems were reviewed and are otherwise  negative/unremarkable except for positive findings mentioned above in the HPI.   MEDICATIONS AT HOME:   Prior to Admission medications   Medication Sig Start Date End Date Taking? Authorizing Provider  aspirin EC 81 MG EC tablet Take 1 tablet (81 mg total) by mouth daily. Patient not taking: Reported on 12/04/2019 11/03/16   Hillary Bow, MD  lisinopril-hydrochlorothiazide (ZESTORETIC) 20-12.5 MG tablet Take 1 tablet by mouth daily. Patient not taking: Reported on 12/04/2019 11/02/16   Hillary Bow, MD  sulfamethoxazole-trimethoprim (BACTRIM DS) 800-160 MG tablet Take 1 tablet by mouth 2 (two) times daily. Patient not taking: Reported on 12/04/2019 10/12/19   Menshew, Dannielle Karvonen, PA-C      VITAL SIGNS:  Blood  pressure (!) 170/83, pulse (!) 57, temperature (!) 100.4 F (38 C), temperature source Oral, resp. rate 20, height 5\' 9"  (1.753 m), weight 95.3 kg, SpO2 91 %.  PHYSICAL EXAMINATION:  Physical Exam  GENERAL:  59 y.o.-year-old African-American male patient lying in the bed with mild respiratory distress with conversational dyspnea.  EYES: Pupils equal, round, reactive to light and accommodation. No scleral icterus. Extraocular muscles intact.  HEENT: Head atraumatic,  normocephalic. Oropharynx and nasopharynx clear.  NECK:  Supple, no jugular venous distention. No thyroid enlargement, no tenderness.  LUNGS: Normal breath sounds bilaterally, no wheezing, rales,rhonchi or crepitation. No use of accessory muscles of respiration.  CARDIOVASCULAR: Regular rate and rhythm, S1, S2 normal. No murmurs, rubs, or gallops.  ABDOMEN: Soft, nondistended, nontender. Bowel sounds present. No organomegaly or mass.  EXTREMITIES: Trace to 1+ bilateral lower extremity edema with no cyanosis, or clubbing.  NEUROLOGIC: Cranial nerves II through XII are intact. Muscle strength 5/5 in all extremities. Sensation intact. Gait not checked.  PSYCHIATRIC: The patient is alert and oriented x 3.  Normal affect and good eye contact. SKIN: No obvious rash, lesion, or ulcer.   LABORATORY PANEL:   CBC Recent Labs  Lab 12/03/19 2152  WBC 15.9*  HGB 14.7  HCT 39.7  PLT 262   ------------------------------------------------------------------------------------------------------------------  Chemistries  Recent Labs  Lab 12/03/19 2152  NA 136  K 4.3  CL 104  CO2 25  GLUCOSE 117*  BUN 12  CREATININE 1.31*  CALCIUM 8.4*  AST 27  ALT 43  ALKPHOS 122  BILITOT 1.5*   ------------------------------------------------------------------------------------------------------------------  Cardiac Enzymes No results for input(s): TROPONINI in the last 168 hours. ------------------------------------------------------------------------------------------------------------------  RADIOLOGY:  DG Chest 2 View  Result Date: 12/03/2019 CLINICAL DATA:  Initial evaluation for acute shortness of breath. EXAM: CHEST - 2 VIEW COMPARISON:  Prior radiograph from 11/02/2016. FINDINGS: Mild cardiomegaly.  Mediastinal silhouette within normal limits. Lungs normally inflated. Perihilar vascular congestion with mild diffuse interstitial prominence, suggesting pulmonary interstitial congestion. Probable  trace bilateral pleural effusions. Suspected underlying COPD. No consolidative opacity. No pneumothorax. No acute osseous finding. IMPRESSION: 1. Cardiomegaly with mild diffuse pulmonary interstitial congestion and trace bilateral pleural effusions. 2. Suspected underlying COPD. Electronically Signed   By: Rise Mu M.D.   On: 12/03/2019 22:21   CT Angio Chest PE W and/or Wo Contrast  Result Date: 12/04/2019 CLINICAL DATA:  Abdominal pain and shortness of breath EXAM: CT ANGIOGRAPHY CHEST WITH CONTRAST TECHNIQUE: Multidetector CT imaging of the chest was performed using the standard protocol during bolus administration of intravenous contrast. Multiplanar CT image reconstructions and MIPs were obtained to evaluate the vascular anatomy. CONTRAST:  OMNIPAQUE IOHEXOL 350 MG/ML SOLN COMPARISON:  None. FINDINGS: Cardiovascular: There is a optimal opacification of the pulmonary arteries. There is no central,segmental, or subsegmental filling defects within the pulmonary arteries. There is mild cardiomegaly. Trace pericardial fusion is seen. Coronary artery calcifications are seen. No pericardial effusion or thickening. No evidence right heart strain. There is normal three-vessel brachiocephalic anatomy without proximal stenosis. Scattered mild aortic atherosclerosis is seen. Mediastinum/Nodes: No hilar, mediastinal, or axillary adenopathy. Scattered small prevascular and pretracheal lymph nodes are present. Thyroid gland, trachea, and esophagus demonstrate no significant findings. Lungs/Pleura: Small bilateral pleural effusions are present, right greater than. Mild hazy ground-glass opacity seen throughout both lungs. No large airspace consolidation. Musculoskeletal: No chest wall abnormality. No acute or significant osseous findings. Review of the MIP images confirms the above findings. Abdomen/pelvis: Hepatobiliary: The liver is normal in density  without focal abnormality.The main portal vein is  patent. There appears to be a small amount of pericholecystic fluid with mild hyperenhancement of the gallbladder wall. No calcified gallstones are present. No intrahepatic biliary ductal dilatation. There are fat stranding changes seen around the gallbladder fundus. A small amount perihepatic fat stranding changes are also noted which track into the right pericolic gutter. Pancreas: Unremarkable. No pancreatic ductal dilatation or surrounding inflammatory changes. Spleen: Normal in size without focal abnormality. Adrenals/Urinary Tract: Both adrenal glands appear normal. The kidneys and collecting system appear normal without evidence of urinary tract calculus or hydronephrosis. Bladder is unremarkable. Stomach/Bowel: The stomach, small bowel, and colon are normal in appearance. No inflammatory changes, wall thickening, or obstructive findings.The appendix is normal. Vascular/Lymphatic: There are no enlarged mesenteric, retroperitoneal, or pelvic lymph nodes. Scattered aortic atherosclerotic calcifications are seen without aneurysmal dilatation. Reproductive: The prostate is unremarkable. Other: No evidence of abdominal wall mass or hernia. Musculoskeletal: No acute or significant osseous findings. Large subchondral cystic changes are seen at the medial aspect of the bilateral femoral heads. Ankylosis seen at the left superior sacroiliac joint. IMPRESSION: 1. No central, segmental, or subsegmental pulmonary embolism. 2. Small bilateral pleural effusions, right greater than left. 3. Mild cardiomegaly. 4. Trace pericardial effusion 5. Small amount of pericholecystic fluid with surrounding fat stranding changes which could be due to acalculous cholecystitis. 6. Mild amount of inflammatory stranding changes tracking into the right pericolic gutter. 7.  Aortic Atherosclerosis (ICD10-I70.0). Electronically Signed   By: Jonna Clark M.D.   On: 12/04/2019 02:59   CT ABDOMEN PELVIS W CONTRAST  Result Date:  12/04/2019 CLINICAL DATA:  Abdominal pain and shortness of breath EXAM: CT ANGIOGRAPHY CHEST WITH CONTRAST TECHNIQUE: Multidetector CT imaging of the chest was performed using the standard protocol during bolus administration of intravenous contrast. Multiplanar CT image reconstructions and MIPs were obtained to evaluate the vascular anatomy. CONTRAST:  OMNIPAQUE IOHEXOL 350 MG/ML SOLN COMPARISON:  None. FINDINGS: Cardiovascular: There is a optimal opacification of the pulmonary arteries. There is no central,segmental, or subsegmental filling defects within the pulmonary arteries. There is mild cardiomegaly. Trace pericardial fusion is seen. Coronary artery calcifications are seen. No pericardial effusion or thickening. No evidence right heart strain. There is normal three-vessel brachiocephalic anatomy without proximal stenosis. Scattered mild aortic atherosclerosis is seen. Mediastinum/Nodes: No hilar, mediastinal, or axillary adenopathy. Scattered small prevascular and pretracheal lymph nodes are present. Thyroid gland, trachea, and esophagus demonstrate no significant findings. Lungs/Pleura: Small bilateral pleural effusions are present, right greater than. Mild hazy ground-glass opacity seen throughout both lungs. No large airspace consolidation. Musculoskeletal: No chest wall abnormality. No acute or significant osseous findings. Review of the MIP images confirms the above findings. Abdomen/pelvis: Hepatobiliary: The liver is normal in density without focal abnormality.The main portal vein is patent. There appears to be a small amount of pericholecystic fluid with mild hyperenhancement of the gallbladder wall. No calcified gallstones are present. No intrahepatic biliary ductal dilatation. There are fat stranding changes seen around the gallbladder fundus. A small amount perihepatic fat stranding changes are also noted which track into the right pericolic gutter. Pancreas: Unremarkable. No pancreatic  ductal dilatation or surrounding inflammatory changes. Spleen: Normal in size without focal abnormality. Adrenals/Urinary Tract: Both adrenal glands appear normal. The kidneys and collecting system appear normal without evidence of urinary tract calculus or hydronephrosis. Bladder is unremarkable. Stomach/Bowel: The stomach, small bowel, and colon are normal in appearance. No inflammatory changes, wall thickening, or obstructive findings.The appendix is normal.  Vascular/Lymphatic: There are no enlarged mesenteric, retroperitoneal, or pelvic lymph nodes. Scattered aortic atherosclerotic calcifications are seen without aneurysmal dilatation. Reproductive: The prostate is unremarkable. Other: No evidence of abdominal wall mass or hernia. Musculoskeletal: No acute or significant osseous findings. Large subchondral cystic changes are seen at the medial aspect of the bilateral femoral heads. Ankylosis seen at the left superior sacroiliac joint. IMPRESSION: 1. No central, segmental, or subsegmental pulmonary embolism. 2. Small bilateral pleural effusions, right greater than left. 3. Mild cardiomegaly. 4. Trace pericardial effusion 5. Small amount of pericholecystic fluid with surrounding fat stranding changes which could be due to acalculous cholecystitis. 6. Mild amount of inflammatory stranding changes tracking into the right pericolic gutter. 7.  Aortic Atherosclerosis (ICD10-I70.0). Electronically Signed   By: Jonna Clark M.D.   On: 12/04/2019 02:59   US ABDOMEN LIMITED RUQ  Result Date: 12/04/2019 CLINICAL DATA:  Initial evaluation for acute right upper quadrant pain. EXAM: ULTRASOUND ABDOMEN LIMITED RIGHT UPPER QUADRANT COMPARISON:  None. FINDINGS: Gallbladder: No stones or sludge seen within the gallbladder lumen. Gallbladder wall measures at the upper limits of normal at 3 mm. No free pericholecystic fluid. A positive sonographic Murphy sign was elicited on exam. Common bile duct: Diameter: 2.9 mm Liver: No  focal lesion identified. Within normal limits in parenchymal echogenicity. Portal vein is patent on color Doppler imaging with normal direction of blood flow towards the liver. Other: None. IMPRESSION: 1. Positive sonographic Murphy sign without cholelithiasis or other features for acute cholecystitis. 2. No biliary dilatation. Electronically Signed   By: Rise Mu M.D.   On: 12/04/2019 03:58      IMPRESSION AND PLAN:   1.  Acute new onset likely diastolic CHF. -The patient will be admitted to a medical monitored bed. -We will diurese with IV Lasix. -Obtain 2D echo -We will follow serial troponin I -Cardiology consult will be obtained. -I notified Dr. Eden Emms about the patient.  2.  Community-acquired pneumonia with subsequent sepsis, without severe sepsis or septic shock.. -The patient will be placed on IV Rocephin and Zithromax. -Sputum Gram stain and culture as well as blood cultures will be followed. -Mucolytic therapy will be given.  3.  Hypertensive urgency. -We will place the patient on p.o. Cardizem CD and as needed IV labetalol.  4.  Atrial fibrillation with rapid ventricular response. -We will continue with p.o. Cardizem CD that should help with his blood pressure as well. -The patient CHA2DS2-VASc score is 2.  He would likely need anticoagulation.  We will give him enteric coated aspirin 325 mg, pending further assessment for elevated troponin I by cardiology.  5.  DVT prophylaxis. -Subcutaneous Lovenox    All the records are reviewed and case discussed with ED provider. The plan of care was discussed in details with the patient (and family). I answered all questions. The patient agreed to proceed with the above mentioned plan. Further management will depend upon hospital course.   CODE STATUS: Full code  Status is: Inpatient  Remains inpatient appropriate because:Hemodynamically unstable, Ongoing active pain requiring inpatient pain management, Ongoing  diagnostic testing needed not appropriate for outpatient work up, Unsafe d/c plan, IV treatments appropriate due to intensity of illness or inability to take PO and Inpatient level of care appropriate due to severity of illness   Dispo: The patient is from: Home              Anticipated d/c is to: Home  Anticipated d/c date is: 2 days              Patient currently is not medically stable to d/c.    TOTAL TIME TAKING CARE OF THIS PATIENT: 55 minutes.    Hannah Beat M.D on 12/04/2019 at 6:01 AM  [facility]   From 7 PM-7 AM, contact night-coverage [email or web address]  CC: Primary care physician; [facility]   Note: This dictation was prepared with Dragon dictation along with smaller phrase technology. Any transcriptional errors that result from this process are unintentional.

## 2019-12-04 NOTE — Progress Notes (Signed)
CODE SEPSIS - PHARMACY COMMUNICATION  **Broad Spectrum Antibiotics should be administered within 1 hour of Sepsis diagnosis**  Time Code Sepsis Called/Page Received: 0409  Antibiotics Ordered: ceftriaxone/doxy  Time of 1st antibiotic administration: 0430  Additional action taken by pharmacy:   If necessary, Name of Provider/Nurse Contacted:     Thomasene Ripple ,PharmD Clinical Pharmacist  12/04/2019  5:12 AM

## 2019-12-04 NOTE — Progress Notes (Signed)
ANTICOAGULATION CONSULT NOTE  Pharmacy Consult for heparin Indication: atrial fibrillation  No Known Allergies  Patient Measurements: Height: 5\' 9"  (175.3 cm) Weight: 95.9 kg (211 lb 6.4 oz) IBW/kg (Calculated) : 70.7 Heparin Dosing Weight: 90.4 kg  Vital Signs: Temp: 99.1 F (37.3 C) (04/26 2004) Temp Source: Oral (04/26 2004) BP: 150/113 (04/26 2004) Pulse Rate: 84 (04/26 2004)  Labs: Recent Labs    12/03/19 2152 12/04/19 0332 12/04/19 0427 12/04/19 1707 12/04/19 2057  HGB 14.7  --   --   --   --   HCT 39.7  --   --   --   --   PLT 262  --   --   --   --   APTT  --   --  36  --   --   LABPROT  --   --  14.9  --   --   INR  --   --  1.2  --   --   HEPARINUNFRC  --   --   --   --  0.24*  CREATININE 1.31*  --   --  1.36*  --   TROPONINIHS 45* 44*  --   --   --     Estimated Creatinine Clearance: 66 mL/min (A) (by C-G formula based on SCr of 1.36 mg/dL (H)).   Medical History: Past Medical History:  Diagnosis Date  . HTN (hypertension)     Assessment: 59 year old male with afib. Patient is not on anticoagulation at this time. Pharmacy consult for heparin drip with plan to transition to DOAC prior to discharge. Patient received Lovenox 40 mg SQ at 1315.  Goal of Therapy:  Heparin level 0.3-0.7 units/ml Monitor platelets by anticoagulation protocol: Yes   Plan:  With recent administration of Lovenox, will defer heparin bolus and start drip at 1300 units/hr. HL at 2100. CBC daily while on heparin drip.  4/26: HL @ 2057 = 0.24 Will order Heparin 1350 units IV X 1 and increase drip rate to 1500 units/hr. Will recheck HL 6 hrs after rate change.   Ellice Boultinghouse D, PharmD 12/04/2019,9:30 PM

## 2019-12-04 NOTE — Progress Notes (Signed)
  PROGRESS NOTE    Lynda Capistran  QHQ:[id number] DOB: [birth year] DOA: 12/04/2019  PCP: [facility]    LOS - 0    Patient admitted overnight with fever, dyspnea, cough and wheezing in addition to right upper quadrant pain.  Interval subjective: He continues report right-sided/right upper quadrant pain but otherwise says he is feeling better.  Denies any respiratory symptoms including cough or congestion, fever or chills.  Denies nausea vomiting or diarrhea.  Exam: Diaphoretic, no acute distress, heart regular rate and rhythm, lungs are clear bilaterally, abdomen is mildly distended nontender on the left but tender with guarding on the right with positive Murphy sign.  Does appear to have some scleral icterus.  I have reviewed the full H&P by Dr. Arville Care in detail, and I agree with the assessment and plan as outlined therein.  In addition: --Follow-up procalcitonin -still pending --Follow-up echo --Follow-up cultures --Consider HIDA scan and general surgery consult --Continue Rocephin, stop azithromycin, and Flagyl to cover potential abdominal/biliary source (chest CT personally reviewed and report reviewed, there is no sign of pneumonia and he has no respiratory symptoms)   Total time: 28 minutes with greater than 50% in coordinating care and at bedside    Pennie Banter, DO [facility]   If 7PM-7AM, please contact night-coverage [email or web address] 12/04/2019, 8:19 AM

## 2019-12-04 NOTE — ED Notes (Signed)
Pt placed on 2L for comfort, admitting md at bedside

## 2019-12-05 ENCOUNTER — Inpatient Hospital Stay: Payer: No Typology Code available for payment source

## 2019-12-05 DIAGNOSIS — I4891 Unspecified atrial fibrillation: Secondary | ICD-10-CM

## 2019-12-05 DIAGNOSIS — I5021 Acute systolic (congestive) heart failure: Secondary | ICD-10-CM | POA: Diagnosis not present

## 2019-12-05 DIAGNOSIS — R1011 Right upper quadrant pain: Secondary | ICD-10-CM

## 2019-12-05 DIAGNOSIS — I160 Hypertensive urgency: Secondary | ICD-10-CM | POA: Diagnosis not present

## 2019-12-05 LAB — ECHOCARDIOGRAM COMPLETE
Height: 69 in
Weight: 3382.4 oz

## 2019-12-05 LAB — CBC
HCT: 37.8 % — ABNORMAL LOW (ref 39.0–52.0)
Hemoglobin: 14.1 g/dL (ref 13.0–17.0)
MCH: 30.1 pg (ref 26.0–34.0)
MCHC: 37.3 g/dL — ABNORMAL HIGH (ref 30.0–36.0)
MCV: 80.8 fL (ref 80.0–100.0)
Platelets: 225 10*3/uL (ref 150–400)
RBC: 4.68 MIL/uL (ref 4.22–5.81)
RDW: 15.2 % (ref 11.5–15.5)
WBC: 13.2 10*3/uL — ABNORMAL HIGH (ref 4.0–10.5)
nRBC: 0 % (ref 0.0–0.2)

## 2019-12-05 LAB — BASIC METABOLIC PANEL
Anion gap: 7 (ref 5–15)
BUN: 17 mg/dL (ref 6–20)
CO2: 27 mmol/L (ref 22–32)
Calcium: 8.4 mg/dL — ABNORMAL LOW (ref 8.9–10.3)
Chloride: 103 mmol/L (ref 98–111)
Creatinine, Ser: 1.36 mg/dL — ABNORMAL HIGH (ref 0.61–1.24)
GFR calc Af Amer: >60 mL/min (ref >60)
GFR calc non Af Amer: 56 mL/min — ABNORMAL LOW (ref >60)
Glucose, Bld: 103 mg/dL — ABNORMAL HIGH (ref 70–99)
Potassium: 4.3 mmol/L (ref 3.5–5.1)
Sodium: 137 mmol/L (ref 135–145)

## 2019-12-05 LAB — BILIRUBIN, TOTAL: Total Bilirubin: 1.7 mg/dL — ABNORMAL HIGH (ref 0.3–1.2)

## 2019-12-05 LAB — HEPARIN LEVEL (UNFRACTIONATED)
Heparin Unfractionated: 0.33 IU/mL (ref 0.30–0.70)
Heparin Unfractionated: 0.25 IU/mL — ABNORMAL LOW (ref 0.30–0.70)
Heparin Unfractionated: 0.27 IU/mL — ABNORMAL LOW (ref 0.30–0.70)

## 2019-12-05 LAB — LEGIONELLA PNEUMOPHILA SEROGP 1 UR AG: L. pneumophila Serogp 1 Ur Ag: NEGATIVE

## 2019-12-05 LAB — HEMOGLOBIN A1C
Hgb A1c MFr Bld: 5.4 % (ref 4.8–5.6)
Mean Plasma Glucose: 108.28 mg/dL

## 2019-12-05 LAB — URINE CULTURE: Culture: NO GROWTH

## 2019-12-05 LAB — MAGNESIUM: Magnesium: 2 mg/dL (ref 1.7–2.4)

## 2019-12-05 LAB — TROPONIN I (HIGH SENSITIVITY): Troponin I (High Sensitivity): 35 ng/L — ABNORMAL HIGH (ref <18)

## 2019-12-05 MED ORDER — FUROSEMIDE 10 MG/ML IJ SOLN
40.0000 mg | Freq: Two times a day (BID) | INTRAMUSCULAR | Status: DC
  Administered 2019-12-06: 06:00:00 40 mg via INTRAVENOUS
  Filled 2019-12-05: qty 4

## 2019-12-05 MED ORDER — TECHNETIUM TC 99M MEBROFENIN IV KIT: 5.0000 | PACK | Freq: Once | INTRAVENOUS | Status: DC | PRN

## 2019-12-05 MED ORDER — TRAZODONE HCL 50 MG PO TABS
50.0000 mg | ORAL_TABLET | Freq: Every evening | ORAL | Status: DC | PRN
  Administered 2019-12-05 – 2019-12-07 (×3): 50 mg via ORAL
  Filled 2019-12-05 (×3): qty 1

## 2019-12-05 MED ORDER — ISOSORBIDE MONONITRATE ER 30 MG PO TB24
15.0000 mg | ORAL_TABLET | Freq: Every day | ORAL | Status: DC
  Administered 2019-12-06 – 2019-12-08 (×3): 15 mg via ORAL
  Filled 2019-12-05 (×3): qty 1

## 2019-12-05 MED ORDER — SODIUM CHLORIDE 0.9 % IV SOLN
INTRAVENOUS | Status: DC | PRN
  Administered 2019-12-05 (×2): 50 mL via INTRAVENOUS

## 2019-12-05 MED ORDER — FUROSEMIDE 10 MG/ML IJ SOLN: 20.0000 mg | Freq: Once | INTRAMUSCULAR | Status: DC

## 2019-12-05 MED ORDER — HYDRALAZINE HCL 25 MG PO TABS: 25.0000 mg | ORAL_TABLET | Freq: Four times a day (QID) | ORAL | Status: DC | PRN

## 2019-12-05 MED ORDER — LOSARTAN POTASSIUM 25 MG PO TABS: 25.0000 mg | ORAL_TABLET | Freq: Every day | ORAL | Status: DC

## 2019-12-05 MED ORDER — HYDRALAZINE HCL 50 MG PO TABS
50.0000 mg | ORAL_TABLET | Freq: Three times a day (TID) | ORAL | Status: DC
  Administered 2019-12-05 – 2019-12-08 (×9): 50 mg via ORAL
  Filled 2019-12-05 (×9): qty 1

## 2019-12-05 MED ORDER — ASPIRIN EC 81 MG PO TBEC
81.0000 mg | DELAYED_RELEASE_TABLET | Freq: Every day | ORAL | Status: DC
  Administered 2019-12-06 – 2019-12-08 (×2): 81 mg via ORAL
  Filled 2019-12-05 (×2): qty 1

## 2019-12-05 NOTE — Progress Notes (Signed)
ANTICOAGULATION CONSULT NOTE  Pharmacy Consult for heparin Indication: atrial fibrillation  No Known Allergies  Patient Measurements: Height: 5\' 9"  (175.3 cm) Weight: 94.9 kg (209 lb 4.8 oz) IBW/kg (Calculated) : 70.7 Heparin Dosing Weight: 90.4 kg  Vital Signs: Temp: 98.4 F (36.9 C) (04/27 0458) Temp Source: Oral (04/27 0458) BP: 160/112 (04/27 0458) Pulse Rate: 75 (04/27 0458)  Labs: Recent Labs    12/03/19 2152 12/04/19 0332 12/04/19 0427 12/04/19 1707 12/04/19 2057 12/05/19 0541  HGB 14.7  --   --   --   --  14.1  HCT 39.7  --   --   --   --  37.8*  PLT 262  --   --   --   --  225  APTT  --   --  36  --   --   --   LABPROT  --   --  14.9  --   --   --   INR  --   --  1.2  --   --   --   HEPARINUNFRC  --   --   --   --  0.24* 0.33  CREATININE 1.31*  --   --  1.36*  --  1.36*  TROPONINIHS 45* 44*  --   --   --  35*    Estimated Creatinine Clearance: 65.7 mL/min (A) (by C-G formula based on SCr of 1.36 mg/dL (H)).   Medical History: Past Medical History:  Diagnosis Date  . HTN (hypertension)     Assessment: 59 year old male with afib. Patient is not on anticoagulation at this time. Pharmacy consult for heparin drip with plan to transition to DOAC prior to discharge. Patient received Lovenox 40 mg SQ at 1315.  Goal of Therapy:  Heparin level 0.3-0.7 units/ml Monitor platelets by anticoagulation protocol: Yes   Plan:  04/27 @ 0600 HL 0.33 therapeutic. Will continue current rate and will recheck HL at 1200, CBC stable will continue to monitor.  5/27, PharmD 12/05/2019,6:26 AM

## 2019-12-05 NOTE — Hospital Course (Signed)
Ollivander See  is a 59 y.o. African-American male with a history of hypertension, who presented to the ED on 12/04/19 with complaints of of dyspnea with associated dry cough, occasional wheezing, and right-sided chest/abdominal pain increasing with cough.  In the ED, hypertensive 174/122, febrile temp 100.4.  Labs notable for BUN of 12 and creatinine 1.31 compared to 14 and 1.19 on 02/22/2017.  BNP was elevated at 660, troponin I 45 then 44 and lactic acid 1.4 with CBC showing leukocytosis of 15.9.  Chest xray showed cardiomegaly with mild diffuse pulmonary tissue congestion and trace bilateral pleural effusions as well as suspected underlying COPD.  ECG showed atrial fibrillation with rapid ventricular response of 124 bpm with poor R wave progression.  Blood cultures were obtained, and patient treated with Rocephin and Doxycycline, Cardizem CD, IV Lasix and Mg.  Admitted to hospitalist service for further evaluation and management.  Cardiology consulted.

## 2019-12-05 NOTE — Progress Notes (Signed)
Progress Note  Patient Name: Gregory Howell Date of Encounter: 12/05/2019  Primary Cardiologist:[facility] [place], Dr. Kirke Corin  Subjective   He reports feeling much better today than yesterday in the emergency department.    He reports improved breathing and abdominal distention.    He denies any further right upper quadrant pain and notes only mild right flank tenderness when pressing on the area.  No chest pain, racing heart rate, or palpitations.  He reports today that he intends to quit smoking.  Echocardiogram results reviewed in detail with the patient today during the visit.  Inpatient Medications    Scheduled Meds: . aspirin EC  325 mg Oral Daily  . carvedilol  6.25 mg Oral BID WC  . furosemide  20 mg Intravenous Q12H  . guaiFENesin  600 mg Oral BID  . sodium chloride flush  3 mL Intravenous Q12H   Continuous Infusions: . sodium chloride    . cefTRIAXone (ROCEPHIN)  IV Stopped (12/04/19 1628)  . heparin 1,500 Units/hr (12/04/19 2132)  . metronidazole 500 mg (12/05/19 0250)   PRN Meds: sodium chloride, acetaminophen, chlorpheniramine-HYDROcodone, labetalol, morphine injection, ondansetron (ZOFRAN) IV, oxyCODONE, sodium chloride flush, traMADol, traZODone   Vital Signs    Vitals:   12/04/19 2004 12/04/19 2300 12/05/19 0458 12/05/19 0759  BP: (!) 150/113  (!) 160/112 (!) 143/100  Pulse: 84  75 84  Resp:    18  Temp: 99.1 F (37.3 C)  98.4 F (36.9 C) 98.1 F (36.7 C)  TempSrc: Oral  Oral Oral  SpO2: 99% 98% 94% 96%  Weight:   94.9 kg   Height:        Intake/Output Summary (Last 24 hours) at 12/05/2019 0903 Last data filed at 12/05/2019 0801 Gross per 24 hour  Intake 500.2 ml  Output 1650 ml  Net -1149.8 ml   Last 3 Weights 12/05/2019 12/04/2019 12/03/2019  Weight (lbs) 209 lb 4.8 oz 211 lb 6.4 oz 210 lb  Weight (kg) 94.938 kg 95.89 kg 95.255 kg      Telemetry    Afib with RVR and rates currently in low 100s - Personally Reviewed  ECG    No  new tracings.- Personally Reviewed  Physical Exam   GEN: No acute distress.  Lying in bed watching television. Neck:  JVP ~9-10cm Cardiac: IRIR, no murmurs, rubs, or gallops.  Respiratory: bibasilar crackles, worse on right side than left. GI: Soft, nontender, non-distended.  Continued tenderness in RUQ, though improved from yesterday MS: No edema; No deformity. Neuro:  Nonfocal  Psych: Normal affect   Labs    High Sensitivity Troponin:   Recent Labs  Lab 12/03/19 2152 12/04/19 0332 12/05/19 0541  TROPONINIHS 45* 44* 35*      Chemistry Recent Labs  Lab 12/03/19 2152 12/04/19 1707 12/05/19 0541  NA 136 136 137  K 4.3 4.5 4.3  CL 104 103 103  CO2 25 24 27   GLUCOSE 117* 104* 103*  BUN 12 16 17   CREATININE 1.31* 1.36* 1.36*  CALCIUM 8.4* 8.5* 8.4*  PROT 7.3  --   --   ALBUMIN 3.8  --   --   AST 27  --   --   ALT 43  --   --   ALKPHOS 122  --   --   BILITOT 1.5*  --  1.7*  GFRNONAA 59* 56* 56*  GFRAA >60 >60 >60  ANIONGAP 7 9 7      Hematology Recent Labs  Lab 12/03/19 2152  12/05/19 0541  WBC 15.9* 13.2*  RBC 4.95 4.68  HGB 14.7 14.1  HCT 39.7 37.8*  MCV 80.2 80.8  MCH 29.7 30.1  MCHC 37.0* 37.3*  RDW 15.1 15.2  PLT 262 225    BNP Recent Labs  Lab 12/03/19 2152  BNP 660.0*     DDimer No results for input(s): DDIMER in the last 168 hours.   Radiology    DG Chest 2 View  Result Date: 12/03/2019 CLINICAL DATA:  Initial evaluation for acute shortness of breath. EXAM: CHEST - 2 VIEW COMPARISON:  Prior radiograph from 11/02/2016. FINDINGS: Mild cardiomegaly.  Mediastinal silhouette within normal limits. Lungs normally inflated. Perihilar vascular congestion with mild diffuse interstitial prominence, suggesting pulmonary interstitial congestion. Probable trace bilateral pleural effusions. Suspected underlying COPD. No consolidative opacity. No pneumothorax. No acute osseous finding. IMPRESSION: 1. Cardiomegaly with mild diffuse pulmonary interstitial  congestion and trace bilateral pleural effusions. 2. Suspected underlying COPD. Electronically Signed   By: Rise Mu M.D.   On: 12/03/2019 22:21   CT Angio Chest PE W and/or Wo Contrast  Result Date: 12/04/2019 CLINICAL DATA:  Abdominal pain and shortness of breath EXAM: CT ANGIOGRAPHY CHEST WITH CONTRAST TECHNIQUE: Multidetector CT imaging of the chest was performed using the standard protocol during bolus administration of intravenous contrast. Multiplanar CT image reconstructions and MIPs were obtained to evaluate the vascular anatomy. CONTRAST:  OMNIPAQUE IOHEXOL 350 MG/ML SOLN COMPARISON:  None. FINDINGS: Cardiovascular: There is a optimal opacification of the pulmonary arteries. There is no central,segmental, or subsegmental filling defects within the pulmonary arteries. There is mild cardiomegaly. Trace pericardial fusion is seen. Coronary artery calcifications are seen. No pericardial effusion or thickening. No evidence right heart strain. There is normal three-vessel brachiocephalic anatomy without proximal stenosis. Scattered mild aortic atherosclerosis is seen. Mediastinum/Nodes: No hilar, mediastinal, or axillary adenopathy. Scattered small prevascular and pretracheal lymph nodes are present. Thyroid gland, trachea, and esophagus demonstrate no significant findings. Lungs/Pleura: Small bilateral pleural effusions are present, right greater than. Mild hazy ground-glass opacity seen throughout both lungs. No large airspace consolidation. Musculoskeletal: No chest wall abnormality. No acute or significant osseous findings. Review of the MIP images confirms the above findings. Abdomen/pelvis: Hepatobiliary: The liver is normal in density without focal abnormality.The main portal vein is patent. There appears to be a small amount of pericholecystic fluid with mild hyperenhancement of the gallbladder wall. No calcified gallstones are present. No intrahepatic biliary ductal dilatation.  There are fat stranding changes seen around the gallbladder fundus. A small amount perihepatic fat stranding changes are also noted which track into the right pericolic gutter. Pancreas: Unremarkable. No pancreatic ductal dilatation or surrounding inflammatory changes. Spleen: Normal in size without focal abnormality. Adrenals/Urinary Tract: Both adrenal glands appear normal. The kidneys and collecting system appear normal without evidence of urinary tract calculus or hydronephrosis. Bladder is unremarkable. Stomach/Bowel: The stomach, small bowel, and colon are normal in appearance. No inflammatory changes, wall thickening, or obstructive findings.The appendix is normal. Vascular/Lymphatic: There are no enlarged mesenteric, retroperitoneal, or pelvic lymph nodes. Scattered aortic atherosclerotic calcifications are seen without aneurysmal dilatation. Reproductive: The prostate is unremarkable. Other: No evidence of abdominal wall mass or hernia. Musculoskeletal: No acute or significant osseous findings. Large subchondral cystic changes are seen at the medial aspect of the bilateral femoral heads. Ankylosis seen at the left superior sacroiliac joint. IMPRESSION: 1. No central, segmental, or subsegmental pulmonary embolism. 2. Small bilateral pleural effusions, right greater than left. 3. Mild cardiomegaly. 4. Trace pericardial effusion  5. Small amount of pericholecystic fluid with surrounding fat stranding changes which could be due to acalculous cholecystitis. 6. Mild amount of inflammatory stranding changes tracking into the right pericolic gutter. 7.  Aortic Atherosclerosis (ICD10-I70.0). Electronically Signed   By: Jonna Clark M.D.   On: 12/04/2019 02:59   CT ABDOMEN PELVIS W CONTRAST  Result Date: 12/04/2019 CLINICAL DATA:  Abdominal pain and shortness of breath EXAM: CT ANGIOGRAPHY CHEST WITH CONTRAST TECHNIQUE: Multidetector CT imaging of the chest was performed using the standard protocol during bolus  administration of intravenous contrast. Multiplanar CT image reconstructions and MIPs were obtained to evaluate the vascular anatomy. CONTRAST:  OMNIPAQUE IOHEXOL 350 MG/ML SOLN COMPARISON:  None. FINDINGS: Cardiovascular: There is a optimal opacification of the pulmonary arteries. There is no central,segmental, or subsegmental filling defects within the pulmonary arteries. There is mild cardiomegaly. Trace pericardial fusion is seen. Coronary artery calcifications are seen. No pericardial effusion or thickening. No evidence right heart strain. There is normal three-vessel brachiocephalic anatomy without proximal stenosis. Scattered mild aortic atherosclerosis is seen. Mediastinum/Nodes: No hilar, mediastinal, or axillary adenopathy. Scattered small prevascular and pretracheal lymph nodes are present. Thyroid gland, trachea, and esophagus demonstrate no significant findings. Lungs/Pleura: Small bilateral pleural effusions are present, right greater than. Mild hazy ground-glass opacity seen throughout both lungs. No large airspace consolidation. Musculoskeletal: No chest wall abnormality. No acute or significant osseous findings. Review of the MIP images confirms the above findings. Abdomen/pelvis: Hepatobiliary: The liver is normal in density without focal abnormality.The main portal vein is patent. There appears to be a small amount of pericholecystic fluid with mild hyperenhancement of the gallbladder wall. No calcified gallstones are present. No intrahepatic biliary ductal dilatation. There are fat stranding changes seen around the gallbladder fundus. A small amount perihepatic fat stranding changes are also noted which track into the right pericolic gutter. Pancreas: Unremarkable. No pancreatic ductal dilatation or surrounding inflammatory changes. Spleen: Normal in size without focal abnormality. Adrenals/Urinary Tract: Both adrenal glands appear normal. The kidneys and collecting system appear normal  without evidence of urinary tract calculus or hydronephrosis. Bladder is unremarkable. Stomach/Bowel: The stomach, small bowel, and colon are normal in appearance. No inflammatory changes, wall thickening, or obstructive findings.The appendix is normal. Vascular/Lymphatic: There are no enlarged mesenteric, retroperitoneal, or pelvic lymph nodes. Scattered aortic atherosclerotic calcifications are seen without aneurysmal dilatation. Reproductive: The prostate is unremarkable. Other: No evidence of abdominal wall mass or hernia. Musculoskeletal: No acute or significant osseous findings. Large subchondral cystic changes are seen at the medial aspect of the bilateral femoral heads. Ankylosis seen at the left superior sacroiliac joint. IMPRESSION: 1. No central, segmental, or subsegmental pulmonary embolism. 2. Small bilateral pleural effusions, right greater than left. 3. Mild cardiomegaly. 4. Trace pericardial effusion 5. Small amount of pericholecystic fluid with surrounding fat stranding changes which could be due to acalculous cholecystitis. 6. Mild amount of inflammatory stranding changes tracking into the right pericolic gutter. 7.  Aortic Atherosclerosis (ICD10-I70.0). Electronically Signed   By: Jonna Clark M.D.   On: 12/04/2019 02:59   ECHOCARDIOGRAM COMPLETE  Result Date: 12/05/2019    ECHOCARDIOGRAM REPORT   Patient Name:   TYRONNE BLANN Date of Exam: 12/04/2019 Medical Rec #:  [id number]      Height:       69.0 in Accession #:    [id number]     Weight:       211.4 lb Date of Birth:  September 14, 1960     BSA:  2.115 m Patient Age:    58 years       BP:           147/121 mmHg Patient Gender: M              HR:           99 bpm. Exam Location:  [facility] Procedure: 2D Echo Indications:     CHF-Acute Diastolic I50.31  History:         Patient has no prior history of Echocardiogram examinations.                  Arrythmias:Atrial Fibrillation; Risk Factors:Current Smoker and                  Hypertension.   Sonographer:     Johnathan Hausen Referring Phys:  [id number] Lennon Alstrom Diagnosing Phys: Yvonne Kendall MD IMPRESSIONS  1. Left ventricular ejection fraction, by estimation, is 25 to 30%. The left ventricle has severely decreased function. The left ventricle demonstrates global hypokinesis. There is moderate left ventricular hypertrophy. Left ventricular diastolic parameters are indeterminate.  2. Right ventricular systolic function is severely reduced. The right ventricular size is normal. There is moderately elevated pulmonary artery systolic pressure. The estimated right ventricular systolic pressure is 64.8 mmHg.  3. Left atrial size was mildly dilated.  4. Right atrial size was mildly dilated.  5. The mitral valve is normal in structure. Mild mitral valve regurgitation.  6. The aortic valve was not well visualized. Aortic valve regurgitation is not visualized. No aortic stenosis is present.  7. The inferior vena cava is dilated in size with <50% respiratory variability, suggesting right atrial pressure of 15 mmHg. FINDINGS  Left Ventricle: Left ventricular ejection fraction, by estimation, is 25 to 30%. The left ventricle has severely decreased function. The left ventricle demonstrates global hypokinesis. The left ventricular internal cavity size was normal in size. There is moderate left ventricular hypertrophy. Left ventricular diastolic parameters are indeterminate. Right Ventricle: The right ventricular size is normal. No increase in right ventricular wall thickness. Right ventricular systolic function is severely reduced. There is moderately elevated pulmonary artery systolic pressure. The tricuspid regurgitant velocity is 3.53 m/s, and with an assumed right atrial pressure of 15 mmHg, the estimated right ventricular systolic pressure is 64.8 mmHg. Left Atrium: Left atrial size was mildly dilated. Right Atrium: Right atrial size was mildly dilated. Pericardium: There is no evidence of pericardial  effusion. Mitral Valve: The mitral valve is normal in structure. Mild mitral valve regurgitation. Tricuspid Valve: The tricuspid valve is not well visualized. Tricuspid valve regurgitation is mild. Aortic Valve: The aortic valve was not well visualized. Aortic valve regurgitation is not visualized. No aortic stenosis is present. Pulmonic Valve: The pulmonic valve was not well visualized. Pulmonic valve regurgitation is not visualized. No evidence of pulmonic stenosis. Aorta: The aortic root is normal in size and structure. Pulmonary Artery: The pulmonary artery is not well seen. Venous: The inferior vena cava is dilated in size with less than 50% respiratory variability, suggesting right atrial pressure of 15 mmHg. IAS/Shunts: No atrial level shunt detected by color flow Doppler.  LEFT VENTRICLE PLAX 2D LVIDd:         5.58 cm LVIDs:         4.72 cm LV PW:         1.49 cm LV IVS:        1.29 cm LVOT diam:     2.00 cm LVOT Area:  3.14 cm  LV Volumes (MOD) LV vol d, MOD A2C: 201.0 ml LV vol d, MOD A4C: 155.0 ml LV vol s, MOD A2C: 147.0 ml LV vol s, MOD A4C: 109.0 ml LV SV MOD A2C:     54.0 ml LV SV MOD A4C:     155.0 ml LV SV MOD BP:      55.9 ml RIGHT VENTRICLE         IVC TAPSE (M-mode): 1.2 cm  IVC diam: 2.89 cm LEFT ATRIUM             Index       RIGHT ATRIUM           Index LA diam:        5.00 cm 2.36 cm/m  RA Area:     24.50 cm LA Vol (A2C):   95.5 ml 45.15 ml/m RA Volume:   86.10 ml  40.70 ml/m LA Vol (A4C):   80.3 ml 37.96 ml/m LA Biplane Vol: 91.5 ml 43.26 ml/m   AORTA Ao Root diam: 3.30 cm TRICUSPID VALVE TR Peak grad:   49.8 mmHg TR Vmax:        353.00 cm/s  SHUNTS Systemic Diam: 2.00 cm Nelva Bush MD Electronically signed by Nelva Bush MD Signature Date/Time: 12/05/2019/6:35:26 AM    Final    US ABDOMEN LIMITED RUQ  Result Date: 12/04/2019 CLINICAL DATA:  Initial evaluation for acute right upper quadrant pain. EXAM: ULTRASOUND ABDOMEN LIMITED RIGHT UPPER QUADRANT COMPARISON:  None.  FINDINGS: Gallbladder: No stones or sludge seen within the gallbladder lumen. Gallbladder wall measures at the upper limits of normal at 3 mm. No free pericholecystic fluid. A positive sonographic Murphy sign was elicited on exam. Common bile duct: Diameter: 2.9 mm Liver: No focal lesion identified. Within normal limits in parenchymal echogenicity. Portal vein is patent on color Doppler imaging with normal direction of blood flow towards the liver. Other: None. IMPRESSION: 1. Positive sonographic Murphy sign without cholelithiasis or other features for acute cholecystitis. 2. No biliary dilatation. Electronically Signed   By: Jeannine Boga M.D.   On: 12/04/2019 03:58    Cardiac Studies   Echo 12/04/2019 1. Left ventricular ejection fraction, by estimation, is 25 to 30%. The  left ventricle has severely decreased function. The left ventricle  demonstrates global hypokinesis. There is moderate left ventricular  hypertrophy. Left ventricular diastolic  parameters are indeterminate.  2. Right ventricular systolic function is severely reduced. The right  ventricular size is normal. There is moderately elevated pulmonary artery  systolic pressure. The estimated right ventricular systolic pressure is  54.6 mmHg.  3. Left atrial size was mildly dilated.  4. Right atrial size was mildly dilated.  5. The mitral valve is normal in structure. Mild mitral valve  regurgitation.  6. The aortic valve was not well visualized. Aortic valve regurgitation  is not visualized. No aortic stenosis is present.  7. The inferior vena cava is dilated in size with <50% respiratory  variability, suggesting right atrial pressure of 15 mmHg.   Patient Profile     59 y.o. male with PMH significant for HFrEF (EF 25-30%), hypertension, tobacco use, previously documented medication noncompliance, h/o supply demand ischemia/chest pain, and who is being seen today for the evaluation of acute heart failure.    Assessment & Plan    Acute on chronic systolic heart failure (27-03%)  Pulmonary HTN ---He reports improved breathing status today. --Continue IV diuresis with lasix 20mg  BID as still mildly volume overloaded.  Consider atrial fibrillation and low EF as contributing to his current volume status. --Continue to monitor I/O, daily weights. Wt 211.4  209.3lbs. Net -649.8cc yesterday and net -500cc today. --Echo as above with severely reduced LVEF 25 to 30% with LV global hypokinesis and LVH.  Also noted is elevated PASP 64.8 mmHg and RAP with severely reduced RVSF. Mild LAE/RAE. Reviewed this with the patient. --Started Coreg.  Do not recommend IV diltiazem, given severely reduced EF as above.  Titrate as needed for HR and BP control.  --Daily BMET, CBC.  Most recent creatinine stable from yesterday at 1.36 with BUN 17.  Continue to monitor electrolytes.  --Given his significantly reduced EF, recommend further ischemic evaluation volume status and breathing has been optimized. Risk factor modification recommended. He reports today that he intends to quit smoking. We also discussed obtaining a sleep study as below.  Newly diagnosed Atrial fibrillation  --Asx. Chronicity unknown. Consider as triggered by current illness.  --Goal ventricular rate <100bpm.  --Do not recommend further IV diltiazem given EF. --CHA2DS2VASc score of at least 3 (HTN, vascular, CHF) with recommendation for long term anticoagulation. --Continue IV heparin.  --We will plan for transition to oral anticoagulation at discharge (and after any procedures, if needed, and with HIDA scan pending). --TSH 1.413, monitor electrolytes. Hgb stable. --Consider outpatient sleep study as below.   Suspected Sleep Apnea --Per patient, he reportedly wakes up frequently short of breath at night.  We discussed that this could be due to his volume status or untreated sleep apnea.   --He previously underwent a sleep study in the past;  however, he never received results.  --He intends to repeat the sleep study. We discussed that untreated sleep apnea is often associated with atrial fibrillation.  Leukocytosis, RUQ pain --Continue abx, blood cultures pending, per IM. Consider infection as trigger for Afib. Per IM, he will receive a HIDA scan today.  HTN --Titrate antihypertensives as needed. Caution with ACE/ARB/ARNI given bump in renal function.  Tobacco use --Cessation advised. He intends to quit smoking.  For questions or updates, please contact [facility] Please consult [email or web address] for contact info under        Signed, Lennon Alstrom, PA-C  12/05/2019, 9:03 AM

## 2019-12-05 NOTE — Plan of Care (Signed)
  Problem: Education: Goal: Knowledge of General Education information will improve Description: Including pain rating scale, medication(s)/side effects and non-pharmacologic comfort measures Outcome: Progressing   Problem: Clinical Measurements: Goal: Respiratory complications will improve Outcome: Progressing Goal: Cardiovascular complication will be avoided Outcome: Progressing   Problem: Nutrition: Goal: Adequate nutrition will be maintained Outcome: Progressing   Problem: Clinical Measurements: Goal: Ability to maintain clinical measurements within normal limits will improve Outcome: Not Progressing

## 2019-12-05 NOTE — Progress Notes (Signed)
PROGRESS NOTE    [name]   ZOX:[id number]  DOB: 13-Jun-1961  PCP: [facility]    DOA: 12/04/2019 LOS: 1   Brief Narrative   Gregory Howell  is a 59 y.o. African-American male with a history of hypertension, who presented to the ED on 12/04/19 with complaints of of dyspnea with associated dry cough, occasional wheezing, and right-sided chest/abdominal pain increasing with cough.  In the ED, hypertensive 174/122, febrile temp 100.4.  Labs notable for BUN of 12 and creatinine 1.31 compared to 14 and 1.19 on 02/22/2017.  BNP was elevated at 660, troponin I 45 then 44 and lactic acid 1.4 with CBC showing leukocytosis of 15.9.  Chest xray showed cardiomegaly with mild diffuse pulmonary tissue congestion and trace bilateral pleural effusions as well as suspected underlying COPD.  ECG showed atrial fibrillation with rapid ventricular response of 124 bpm with poor R wave progression.  Blood cultures were obtained, and patient treated with Rocephin and Doxycycline, Cardizem CD, IV Lasix and Mg.  Admitted to hospitalist service for further evaluation and management.  Cardiology consulted.     Assessment & Plan   Principal Problem:   Right upper quadrant abdominal pain Active Problems:   Hypertensive urgency   Atrial fibrillation with RVR (HCC)   Leukocytosis   Tobacco abuse   Acute systolic CHF, new onset - Echo 4/27 showed EF 25-30% with global LV hypokinesis, moderate LVH, severely reduced RV function and moderate pulmonary hypertension.   --Cardiology following --Lasix 20 mg IV BID, gentle diuresis (net I/O's to date -2034) --strict I/O's, daily weights --continue Coreg  --Hold losartan due to renal function --avoid CCB's with reduced EF --Obtain ReDS clip reading  Atrial fibrillation with rapid ventricular response - New onset, likely due to acute CHF.  CHA2DS2-VASc score is at least 3 (HTN, CHF, vascular dz).  Rate now controlled. --Cardiology following --Heparin drip  until need for procedures/surgery is ruled out, then start DOAC --Continue Coreg --maintain K>4.0 and Mg>2.0 --recommend outpatient sleep study to evaluate for underlying sleep apnea  Elevated troponin - likely demand ischemia in setting of above.  Trend was flat, peaked at 44 >> 35.  Patient without chest pain.  Treated with full dose ASA on admission.  Hypertensive urgency - improved, still diastolic running high --Continue Coreg --losartan on hold due to renal function --start hydralazine 50 mg PO q8h for now, resume ARB and titrate once renal better --PRN oral hydralazine or IV labetalol --avoid CCB's with reduced EF  SIRS without infection source - presented with fever, tachycardia and leukocytosis.  Patient initially started on treatment for CAP, given his respiratory symptoms.  However, CT chest had no infiltrates and respiratory issues resolved with diuresis.  He was having RUQ abdominal pain and positive Murphy's sign, so changed antibiotics to Rocephin and Flagyl (stopped Zithromax) to cover intraabdominal source.  RUQ U/S and HIDA scan both negative.  Pain resolved, so will monitor for now.  Procal and lactate both normal. --stop antibiotics and monitor clinically --follow blood cultures (blood, urine, sputum)  Tobacco abuse - counseled on cessation, complicates his overall prognosis.   Patient states he intends to quit.  Patient BMI: Body mass index is 30.91 kg/m.   DVT prophylaxis: on heparin gtt  Diet:  Diet Orders (From admission, onward)    Start     Ordered   12/05/19 1542  Diet Heart Room service appropriate? Yes; Fluid consistency: Thin  Diet effective now    Question Answer Comment  Room service appropriate? Yes   Fluid consistency: Thin      12/05/19 1541            Code Status: Full Code    Subjective 12/05/19    Patient seen at bedside this AM.  Reports his RUQ pain has resolved.  No nausea, vomiting or chest pain or SOB.  We discussed HIDA scan  and its purpose, he's agreeable.  No fevers or chills or other complaints.   Disposition Plan & Communication   Dispo & Barriers: expect d/c home pending cardiology clearance Coming from: home Exp d/c date: 4/28-29 Medically stable for d/c? no  Family Communication: none at bedside during encounter, patient to update    Consults, Procedures, Significant Events   Consultants:   Cardiology  Procedures:   RUQ ultrasound 4/26  HIDA scan 4/27  Antimicrobials:   Rocephin and Flagyl 4/26>>4/27   Zithromax 4/26    Objective   Vitals:   12/04/19 2300 12/05/19 0458 12/05/19 0759 12/05/19 1125  BP:  (!) 160/112 (!) 143/100 (!) 143/110  Pulse:  75 84 92  Resp:   18 18  Temp:  98.4 F (36.9 C) 98.1 F (36.7 C) 97.9 F (36.6 C)  TempSrc:  Oral Oral Oral  SpO2: 98% 94% 96% 98%  Weight:  94.9 kg    Height:        Intake/Output Summary (Last 24 hours) at 12/05/2019 1559 Last data filed at 12/05/2019 1127 Gross per 24 hour  Intake 665.2 ml  Output 2400 ml  Net -1734.8 ml   Filed Weights   12/03/19 2146 12/04/19 1723 12/05/19 0458  Weight: 95.3 kg 95.9 kg 94.9 kg    Physical Exam:  General exam: awake, alert, no acute distress HEENT: moist mucus membranes, hearing grossly normal  Respiratory system: CTAB, no wheezes, rales or rhonchi, normal respiratory effort. Cardiovascular system: normal S1/S2, RRR, no JVD, murmurs, rubs, gallops, no pedal edema.   Gastrointestinal system: soft, NT, ND, no HSM felt, +bowel sounds. Central nervous system: A&O x4. no gross focal neurologic deficits, normal speech Extremities: moves all, no edema, normal tone Skin: dry, intact, normal temperature, normal color, No rashes, lesions or ulcers Psychiatry: normal mood, congruent affect, judgement and insight appear normal  Labs   Data Reviewed: I have personally reviewed following labs and imaging studies  CBC: Recent Labs  Lab 12/03/19 2152 12/05/19 0541  WBC 15.9* 13.2*  HGB  14.7 14.1  HCT 39.7 37.8*  MCV 80.2 80.8  PLT 262 225   Basic Metabolic Panel: Recent Labs  Lab 12/03/19 2152 12/04/19 1707 12/05/19 0541  NA 136 136 137  K 4.3 4.5 4.3  CL 104 103 103  CO2 GLUCOSE 117* 104* 103*  BUN CREATININE 1.31* 1.36* 1.36*  CALCIUM 8.4* 8.5* 8.4*  MG  --   --  2.0   GFR: Estimated Creatinine Clearance: 65.7 mL/min (A) (by C-G formula based on SCr of 1.36 mg/dL (H)). Liver Function Tests: Recent Labs  Lab 12/03/19 2152 12/05/19 0541  AST 27  --   ALT 43  --   ALKPHOS 122  --   BILITOT 1.5* 1.7*  PROT 7.3  --   ALBUMIN 3.8  --    Recent Labs  Lab 12/03/19 2152  LIPASE 19   No results for input(s): AMMONIA in the last 168 hours. Coagulation Profile: Recent Labs  Lab 12/04/19 0427  INR 1.2   Cardiac Enzymes: No results for  input(s): CKTOTAL, CKMB, CKMBINDEX, TROPONINI in the last 168 hours. BNP (last 3 results) No results for input(s): PROBNP in the last 8760 hours. HbA1C: Recent Labs    12/05/19 0541  HGBA1C 5.4   CBG: No results for input(s): GLUCAP in the last 168 hours. Lipid Profile: No results for input(s): CHOL, HDL, LDLCALC, TRIG, CHOLHDL, LDLDIRECT in the last 72 hours. Thyroid Function Tests: Recent Labs    12/04/19 1315  TSH 1.413   Anemia Panel: No results for input(s): VITAMINB12, FOLATE, FERRITIN, TIBC, IRON, RETICCTPCT in the last 72 hours. Sepsis Labs: Recent Labs  Lab 12/04/19 0427 12/04/19 1707  PROCALCITON  --  <0.10  LATICACIDVEN 1.4  --     Recent Results (from the past 240 hour(s))  Respiratory Panel by RT PCR (Flu A&B, Covid) - Nasopharyngeal Swab     Status: None   Collection Time: 12/04/19  2:32 AM   Specimen: Nasopharyngeal Swab  Result Value Ref Range Status   SARS Coronavirus 2 by RT PCR NEGATIVE NEGATIVE Final    Comment: (NOTE) SARS-CoV-2 target nucleic acids are NOT DETECTED. The SARS-CoV-2 RNA is generally detectable in upper respiratoy specimens during the  acute phase of infection. The lowest concentration of SARS-CoV-2 viral copies this assay can detect is 131 copies/mL. A negative result does not preclude SARS-Cov-2 infection and should not be used as the sole basis for treatment or other patient management decisions. A negative result may occur with  improper specimen collection/handling, submission of specimen other than nasopharyngeal swab, presence of viral mutation(s) within the areas targeted by this assay, and inadequate number of viral copies (<131 copies/mL). A negative result must be combined with clinical observations, patient history, and epidemiological information. The expected result is Negative. Fact Sheet for Patients:  https://www.moore.com/ Fact Sheet for Healthcare Providers:  https://www.young.biz/ This test is not yet ap proved or cleared by the [place] FDA and  has been authorized for detection and/or diagnosis of SARS-CoV-2 by FDA under an Emergency Use Authorization (EUA). This EUA will remain  in effect (meaning this test can be used) for the duration of the COVID-19 declaration under Section 564(b)(1) of the Act, 21 U.S.C. section 360bbb-3(b)(1), unless the authorization is terminated or revoked sooner.    Influenza A by PCR NEGATIVE NEGATIVE Final   Influenza B by PCR NEGATIVE NEGATIVE Final    Comment: (NOTE) The Xpert Xpress SARS-CoV-2/FLU/RSV assay is intended as an aid in  the diagnosis of influenza from Nasopharyngeal swab specimens and  should not be used as a sole basis for treatment. Nasal washings and  aspirates are unacceptable for Xpert Xpress SARS-CoV-2/FLU/RSV  testing. Fact Sheet for Patients: https://www.moore.com/ Fact Sheet for Healthcare Providers: https://www.young.biz/ This test is not yet approved or cleared by the [place] FDA and  has been authorized for detection and/or diagnosis of SARS-CoV-2  by  FDA under an Emergency Use Authorization (EUA). This EUA will remain  in effect (meaning this test can be used) for the duration of the  Covid-19 declaration under Section 564(b)(1) of the Act, 21  U.S.C. section 360bbb-3(b)(1), unless the authorization is  terminated or revoked. Performed at [facility], [address] [place], [place] [address]   Blood culture (routine x 2)     Status: None (Preliminary result)   Collection Time: 12/04/19  4:27 AM   Specimen: BLOOD  Result Value Ref Range Status   Specimen Description BLOOD RIGHT ANTECUBITAL  Final   Special Requests   Final  BOTTLES DRAWN AEROBIC AND ANAEROBIC Blood Culture results may not be optimal due to an excessive volume of blood received in culture bottles   Culture   Final    NO GROWTH 1 DAY Performed at [facility], [address] [place], [place] [address]    Report Status PENDING  Incomplete  Blood culture (routine x 2)     Status: None (Preliminary result)   Collection Time: 12/04/19  4:27 AM   Specimen: BLOOD  Result Value Ref Range Status   Specimen Description BLOOD RIGHT ANTECUBITAL  Final   Special Requests   Final    BOTTLES DRAWN AEROBIC AND ANAEROBIC Blood Culture results may not be optimal due to an excessive volume of blood received in culture bottles   Culture   Final    NO GROWTH 1 DAY Performed at [facility], [address], [place], [place] [address]    Report Status PENDING  Incomplete  Urine culture     Status: None   Collection Time: 12/04/19  4:27 AM   Specimen: In/Out Cath Urine  Result Value Ref Range Status   Specimen Description   Final    IN/OUT CATH URINE Performed at [facility], [address] [place], [place] [address]    Special Requests   Final    NONE Performed at [facility], [address] [place], [place] [address]    Culture   Final    NO GROWTH Performed at [facility], [address],  [place], [place] [address]    Report Status 12/05/2019 FINAL  Final      Imaging Studies   DG Chest 2 View  Result Date: 12/03/2019 CLINICAL DATA:  Initial evaluation for acute shortness of breath. EXAM: CHEST - 2 VIEW COMPARISON:  Prior radiograph from 11/02/2016. FINDINGS: Mild cardiomegaly.  Mediastinal silhouette within normal limits. Lungs normally inflated. Perihilar vascular congestion with mild diffuse interstitial prominence, suggesting pulmonary interstitial congestion. Probable trace bilateral pleural effusions. Suspected underlying COPD. No consolidative opacity. No pneumothorax. No acute osseous finding. IMPRESSION: 1. Cardiomegaly with mild diffuse pulmonary interstitial congestion and trace bilateral pleural effusions. 2. Suspected underlying COPD. Electronically Signed   By: Rise Mu M.D.   On: 12/03/2019 22:21   CT Angio Chest PE W and/or Wo Contrast  Result Date: 12/04/2019 CLINICAL DATA:  Abdominal pain and shortness of breath EXAM: CT ANGIOGRAPHY CHEST WITH CONTRAST TECHNIQUE: Multidetector CT imaging of the chest was performed using the standard protocol during bolus administration of intravenous contrast. Multiplanar CT image reconstructions and MIPs were obtained to evaluate the vascular anatomy. CONTRAST:  OMNIPAQUE IOHEXOL 350 MG/ML SOLN COMPARISON:  None. FINDINGS: Cardiovascular: There is a optimal opacification of the pulmonary arteries. There is no central,segmental, or subsegmental filling defects within the pulmonary arteries. There is mild cardiomegaly. Trace pericardial fusion is seen. Coronary artery calcifications are seen. No pericardial effusion or thickening. No evidence right heart strain. There is normal three-vessel brachiocephalic anatomy without proximal stenosis. Scattered mild aortic atherosclerosis is seen. Mediastinum/Nodes: No hilar, mediastinal, or axillary adenopathy. Scattered small prevascular and pretracheal lymph nodes are present.  Thyroid gland, trachea, and esophagus demonstrate no significant findings. Lungs/Pleura: Small bilateral pleural effusions are present, right greater than. Mild hazy ground-glass opacity seen throughout both lungs. No large airspace consolidation. Musculoskeletal: No chest wall abnormality. No acute or significant osseous findings. Review of the MIP images confirms the above findings. Abdomen/pelvis: Hepatobiliary: The liver is normal in density without focal abnormality.The  main portal vein is patent. There appears to be a small amount of pericholecystic fluid with mild hyperenhancement of the gallbladder wall. No calcified gallstones are present. No intrahepatic biliary ductal dilatation. There are fat stranding changes seen around the gallbladder fundus. A small amount perihepatic fat stranding changes are also noted which track into the right pericolic gutter. Pancreas: Unremarkable. No pancreatic ductal dilatation or surrounding inflammatory changes. Spleen: Normal in size without focal abnormality. Adrenals/Urinary Tract: Both adrenal glands appear normal. The kidneys and collecting system appear normal without evidence of urinary tract calculus or hydronephrosis. Bladder is unremarkable. Stomach/Bowel: The stomach, small bowel, and colon are normal in appearance. No inflammatory changes, wall thickening, or obstructive findings.The appendix is normal. Vascular/Lymphatic: There are no enlarged mesenteric, retroperitoneal, or pelvic lymph nodes. Scattered aortic atherosclerotic calcifications are seen without aneurysmal dilatation. Reproductive: The prostate is unremarkable. Other: No evidence of abdominal wall mass or hernia. Musculoskeletal: No acute or significant osseous findings. Large subchondral cystic changes are seen at the medial aspect of the bilateral femoral heads. Ankylosis seen at the left superior sacroiliac joint. IMPRESSION: 1. No central, segmental, or subsegmental pulmonary embolism. 2.  Small bilateral pleural effusions, right greater than left. 3. Mild cardiomegaly. 4. Trace pericardial effusion 5. Small amount of pericholecystic fluid with surrounding fat stranding changes which could be due to acalculous cholecystitis. 6. Mild amount of inflammatory stranding changes tracking into the right pericolic gutter. 7.  Aortic Atherosclerosis (ICD10-I70.0). Electronically Signed   By: Jonna Clark M.D.   On: 12/04/2019 02:59   CT ABDOMEN PELVIS W CONTRAST  Result Date: 12/04/2019 CLINICAL DATA:  Abdominal pain and shortness of breath EXAM: CT ANGIOGRAPHY CHEST WITH CONTRAST TECHNIQUE: Multidetector CT imaging of the chest was performed using the standard protocol during bolus administration of intravenous contrast. Multiplanar CT image reconstructions and MIPs were obtained to evaluate the vascular anatomy. CONTRAST:  OMNIPAQUE IOHEXOL 350 MG/ML SOLN COMPARISON:  None. FINDINGS: Cardiovascular: There is a optimal opacification of the pulmonary arteries. There is no central,segmental, or subsegmental filling defects within the pulmonary arteries. There is mild cardiomegaly. Trace pericardial fusion is seen. Coronary artery calcifications are seen. No pericardial effusion or thickening. No evidence right heart strain. There is normal three-vessel brachiocephalic anatomy without proximal stenosis. Scattered mild aortic atherosclerosis is seen. Mediastinum/Nodes: No hilar, mediastinal, or axillary adenopathy. Scattered small prevascular and pretracheal lymph nodes are present. Thyroid gland, trachea, and esophagus demonstrate no significant findings. Lungs/Pleura: Small bilateral pleural effusions are present, right greater than. Mild hazy ground-glass opacity seen throughout both lungs. No large airspace consolidation. Musculoskeletal: No chest wall abnormality. No acute or significant osseous findings. Review of the MIP images confirms the above findings. Abdomen/pelvis: Hepatobiliary: The liver  is normal in density without focal abnormality.The main portal vein is patent. There appears to be a small amount of pericholecystic fluid with mild hyperenhancement of the gallbladder wall. No calcified gallstones are present. No intrahepatic biliary ductal dilatation. There are fat stranding changes seen around the gallbladder fundus. A small amount perihepatic fat stranding changes are also noted which track into the right pericolic gutter. Pancreas: Unremarkable. No pancreatic ductal dilatation or surrounding inflammatory changes. Spleen: Normal in size without focal abnormality. Adrenals/Urinary Tract: Both adrenal glands appear normal. The kidneys and collecting system appear normal without evidence of urinary tract calculus or hydronephrosis. Bladder is unremarkable. Stomach/Bowel: The stomach, small bowel, and colon are normal in appearance. No inflammatory changes, wall thickening, or obstructive findings.The appendix is normal. Vascular/Lymphatic: There are no  enlarged mesenteric, retroperitoneal, or pelvic lymph nodes. Scattered aortic atherosclerotic calcifications are seen without aneurysmal dilatation. Reproductive: The prostate is unremarkable. Other: No evidence of abdominal wall mass or hernia. Musculoskeletal: No acute or significant osseous findings. Large subchondral cystic changes are seen at the medial aspect of the bilateral femoral heads. Ankylosis seen at the left superior sacroiliac joint. IMPRESSION: 1. No central, segmental, or subsegmental pulmonary embolism. 2. Small bilateral pleural effusions, right greater than left. 3. Mild cardiomegaly. 4. Trace pericardial effusion 5. Small amount of pericholecystic fluid with surrounding fat stranding changes which could be due to acalculous cholecystitis. 6. Mild amount of inflammatory stranding changes tracking into the right pericolic gutter. 7.  Aortic Atherosclerosis (ICD10-I70.0). Electronically Signed   By: Jonna Clark M.D.   On:  12/04/2019 02:59   NM Hepato W/EF  Result Date: 12/05/2019 CLINICAL DATA:  Right upper quadrant abdominal pain. EXAM: NUCLEAR MEDICINE HEPATOBILIARY IMAGING WITH GALLBLADDER EF TECHNIQUE: Sequential images of the abdomen were obtained out to 60 minutes following intravenous administration of radiopharmaceutical. After oral ingestion of Ensure, gallbladder ejection fraction was determined. At 60 min, normal ejection fraction is greater than 33%. RADIOPHARMACEUTICALS:  5.376 mCi Tc-54m  Choletec IV COMPARISON:  December 04, 2019. FINDINGS: Prompt uptake and biliary excretion of activity by the liver is seen. Gallbladder activity is visualized, consistent with patency of cystic duct. Biliary activity passes into small bowel, consistent with patent common bile duct. Calculated gallbladder ejection fraction is 47%. No symptoms were reported during ingestion of Ensure. (Normal gallbladder ejection fraction with Ensure is greater than 33%.) IMPRESSION: Normal gallbladder ejection fracture after Ensure administration. Electronically Signed   By: Lupita Raider M.D.   On: 12/05/2019 14:35   ECHOCARDIOGRAM COMPLETE  Result Date: 12/05/2019    ECHOCARDIOGRAM REPORT   Patient Name:   Gregory Howell Date of Exam: 12/04/2019 Medical Rec #:  [id number]      Height:       69.0 in Accession #:    [id number]     Weight:       211.4 lb Date of Birth:  Nov 02, 1960     BSA:          2.115 m Patient Age:    58 years       BP:           147/121 mmHg Patient Gender: M              HR:           99 bpm. Exam Location:  [facility] Procedure: 2D Echo Indications:     CHF-Acute Diastolic I50.31  History:         Patient has no prior history of Echocardiogram examinations.                  Arrythmias:Atrial Fibrillation; Risk Factors:Current Smoker and                  Hypertension.  Sonographer:     Johnathan Hausen Referring Phys:  [id number] Lennon Alstrom Diagnosing Phys: Yvonne Kendall MD IMPRESSIONS  1. Left ventricular ejection fraction,  by estimation, is 25 to 30%. The left ventricle has severely decreased function. The left ventricle demonstrates global hypokinesis. There is moderate left ventricular hypertrophy. Left ventricular diastolic parameters are indeterminate.  2. Right ventricular systolic function is severely reduced. The right ventricular size is normal. There is moderately elevated pulmonary artery systolic pressure. The estimated right ventricular systolic pressure is 64.8 mmHg.  3.  Left atrial size was mildly dilated.  4. Right atrial size was mildly dilated.  5. The mitral valve is normal in structure. Mild mitral valve regurgitation.  6. The aortic valve was not well visualized. Aortic valve regurgitation is not visualized. No aortic stenosis is present.  7. The inferior vena cava is dilated in size with <50% respiratory variability, suggesting right atrial pressure of 15 mmHg. FINDINGS  Left Ventricle: Left ventricular ejection fraction, by estimation, is 25 to 30%. The left ventricle has severely decreased function. The left ventricle demonstrates global hypokinesis. The left ventricular internal cavity size was normal in size. There is moderate left ventricular hypertrophy. Left ventricular diastolic parameters are indeterminate. Right Ventricle: The right ventricular size is normal. No increase in right ventricular wall thickness. Right ventricular systolic function is severely reduced. There is moderately elevated pulmonary artery systolic pressure. The tricuspid regurgitant velocity is 3.53 m/s, and with an assumed right atrial pressure of 15 mmHg, the estimated right ventricular systolic pressure is 62.7 mmHg. Left Atrium: Left atrial size was mildly dilated. Right Atrium: Right atrial size was mildly dilated. Pericardium: There is no evidence of pericardial effusion. Mitral Valve: The mitral valve is normal in structure. Mild mitral valve regurgitation. Tricuspid Valve: The tricuspid valve is not well visualized. Tricuspid  valve regurgitation is mild. Aortic Valve: The aortic valve was not well visualized. Aortic valve regurgitation is not visualized. No aortic stenosis is present. Pulmonic Valve: The pulmonic valve was not well visualized. Pulmonic valve regurgitation is not visualized. No evidence of pulmonic stenosis. Aorta: The aortic root is normal in size and structure. Pulmonary Artery: The pulmonary artery is not well seen. Venous: The inferior vena cava is dilated in size with less than 50% respiratory variability, suggesting right atrial pressure of 15 mmHg. IAS/Shunts: No atrial level shunt detected by color flow Doppler.  LEFT VENTRICLE PLAX 2D LVIDd:         5.58 cm LVIDs:         4.72 cm LV PW:         1.49 cm LV IVS:        1.29 cm LVOT diam:     2.00 cm LVOT Area:     3.14 cm  LV Volumes (MOD) LV vol d, MOD A2C: 201.0 ml LV vol d, MOD A4C: 155.0 ml LV vol s, MOD A2C: 147.0 ml LV vol s, MOD A4C: 109.0 ml LV SV MOD A2C:     54.0 ml LV SV MOD A4C:     155.0 ml LV SV MOD BP:      55.9 ml RIGHT VENTRICLE         IVC TAPSE (M-mode): 1.2 cm  IVC diam: 2.89 cm LEFT ATRIUM             Index       RIGHT ATRIUM           Index LA diam:        5.00 cm 2.36 cm/m  RA Area:     24.50 cm LA Vol (A2C):   95.5 ml 45.15 ml/m RA Volume:   86.10 ml  40.70 ml/m LA Vol (A4C):   80.3 ml 37.96 ml/m LA Biplane Vol: 91.5 ml 43.26 ml/m   AORTA Ao Root diam: 3.30 cm TRICUSPID VALVE TR Peak grad:   49.8 mmHg TR Vmax:        353.00 cm/s  SHUNTS Systemic Diam: 2.00 cm Nelva Bush MD Electronically signed by Nelva Bush MD Signature Date/Time:  12/05/2019/6:35:26 AM    Final    US ABDOMEN LIMITED RUQ  Result Date: 12/04/2019 CLINICAL DATA:  Initial evaluation for acute right upper quadrant pain. EXAM: ULTRASOUND ABDOMEN LIMITED RIGHT UPPER QUADRANT COMPARISON:  None. FINDINGS: Gallbladder: No stones or sludge seen within the gallbladder lumen. Gallbladder wall measures at the upper limits of normal at 3 mm. No free pericholecystic  fluid. A positive sonographic Murphy sign was elicited on exam. Common bile duct: Diameter: 2.9 mm Liver: No focal lesion identified. Within normal limits in parenchymal echogenicity. Portal vein is patent on color Doppler imaging with normal direction of blood flow towards the liver. Other: None. IMPRESSION: 1. Positive sonographic Murphy sign without cholelithiasis or other features for acute cholecystitis. 2. No biliary dilatation. Electronically Signed   By: Rise Mu M.D.   On: 12/04/2019 03:58     Medications   Scheduled Meds: . aspirin EC  325 mg Oral Daily  . carvedilol  6.25 mg Oral BID WC  . furosemide  20 mg Intravenous Q12H  . guaiFENesin  600 mg Oral BID  . [START ON 12/06/2019] losartan  25 mg Oral Daily  . sodium chloride flush  3 mL Intravenous Q12H   Continuous Infusions: . sodium chloride    . sodium chloride 50 mL (12/05/19 1552)  . cefTRIAXone (ROCEPHIN)  IV 2 g (12/05/19 1553)  . heparin 1,650 Units/hr (12/05/19 1545)  . metronidazole 100 mL/hr at 12/05/19 1100       LOS: 1 day    Time spent: 45 minutes total time were spent, with >50% coordinating care and direct patient care    Pennie Banter, DO [facility]   If 7PM-7AM, please contact night-coverage [email or web address] 12/05/2019, 3:59 PM

## 2019-12-05 NOTE — Progress Notes (Signed)
ANTICOAGULATION CONSULT NOTE  Pharmacy Consult for heparin Indication: atrial fibrillation  No Known Allergies  Patient Measurements: Height: 5\' 9"  (175.3 cm) Weight: 94.9 kg (209 lb 4.8 oz) IBW/kg (Calculated) : 70.7 Heparin Dosing Weight: 90.4 kg  Vital Signs: Temp: 97.9 F (36.6 C) (04/27 1125) Temp Source: Oral (04/27 1125) BP: 143/110 (04/27 1125) Pulse Rate: 92 (04/27 1125)  Labs: Recent Labs    12/03/19 2152 12/04/19 0332 12/04/19 0427 12/04/19 1707 12/04/19 2057 12/05/19 0541 12/05/19 1444  HGB 14.7  --   --   --   --  14.1  --   HCT 39.7  --   --   --   --  37.8*  --   PLT 262  --   --   --   --  225  --   APTT  --   --  36  --   --   --   --   LABPROT  --   --  14.9  --   --   --   --   INR  --   --  1.2  --   --   --   --   HEPARINUNFRC  --   --   --   --  0.24* 0.33 0.25*  CREATININE 1.31*  --   --  1.36*  --  1.36*  --   TROPONINIHS 45* 44*  --   --   --  35*  --     Estimated Creatinine Clearance: 65.7 mL/min (A) (by C-G formula based on SCr of 1.36 mg/dL (H)).   Medical History: Past Medical History:  Diagnosis Date  . HTN (hypertension)     Assessment: 59 year old male with afib. Patient is not on anticoagulation at this time. Pharmacy consult for heparin drip with plan to transition to DOAC prior to discharge. Patient received Lovenox 40 mg SQ at 1315.  4/27 0541 HL 0.33  4/27 1444 HL 0.25 Will increase heparin infusion to 1650 units/hr  Goal of Therapy:  Heparin level 0.3-0.7 units/ml Monitor platelets by anticoagulation protocol: Yes   Plan:  Heparin level is subtherapeutic. Will increase heparin rate to 1650 and will recheck HL in 6 hours, CBC stable will continue to monitor.  5/27, PharmD 12/05/2019,3:33 PM

## 2019-12-06 DIAGNOSIS — Z720 Tobacco use: Secondary | ICD-10-CM | POA: Diagnosis not present

## 2019-12-06 DIAGNOSIS — I4891 Unspecified atrial fibrillation: Secondary | ICD-10-CM | POA: Diagnosis not present

## 2019-12-06 DIAGNOSIS — I5021 Acute systolic (congestive) heart failure: Secondary | ICD-10-CM | POA: Diagnosis not present

## 2019-12-06 LAB — MAGNESIUM: Magnesium: 2.3 mg/dL (ref 1.7–2.4)

## 2019-12-06 LAB — BASIC METABOLIC PANEL
Anion gap: 8 (ref 5–15)
BUN: 20 mg/dL (ref 6–20)
CO2: 26 mmol/L (ref 22–32)
Calcium: 8.4 mg/dL — ABNORMAL LOW (ref 8.9–10.3)
Chloride: 102 mmol/L (ref 98–111)
Creatinine, Ser: 1.57 mg/dL — ABNORMAL HIGH (ref 0.61–1.24)
GFR calc Af Amer: 55 mL/min — ABNORMAL LOW (ref >60)
GFR calc non Af Amer: 47 mL/min — ABNORMAL LOW (ref >60)
Glucose, Bld: 103 mg/dL — ABNORMAL HIGH (ref 70–99)
Potassium: 4.1 mmol/L (ref 3.5–5.1)
Sodium: 136 mmol/L (ref 135–145)

## 2019-12-06 LAB — CBC
HCT: 42 % (ref 39.0–52.0)
Hemoglobin: 15.2 g/dL (ref 13.0–17.0)
MCH: 29.9 pg (ref 26.0–34.0)
MCHC: 36.2 g/dL — ABNORMAL HIGH (ref 30.0–36.0)
MCV: 82.7 fL (ref 80.0–100.0)
Platelets: 263 10*3/uL (ref 150–400)
RBC: 5.08 MIL/uL (ref 4.22–5.81)
RDW: 15.3 % (ref 11.5–15.5)
WBC: 12.8 10*3/uL — ABNORMAL HIGH (ref 4.0–10.5)
nRBC: 0 % (ref 0.0–0.2)

## 2019-12-06 LAB — TROPONIN I (HIGH SENSITIVITY): Troponin I (High Sensitivity): 27 ng/L — ABNORMAL HIGH (ref <18)

## 2019-12-06 LAB — HEPARIN LEVEL (UNFRACTIONATED)
Heparin Unfractionated: 0.46 IU/mL (ref 0.30–0.70)
Heparin Unfractionated: 0.38 IU/mL (ref 0.30–0.70)

## 2019-12-06 MED ORDER — CARVEDILOL 12.5 MG PO TABS
12.5000 mg | ORAL_TABLET | Freq: Two times a day (BID) | ORAL | Status: DC
  Administered 2019-12-06: 6.25 mg via ORAL
  Administered 2019-12-06 – 2019-12-07 (×3): 12.5 mg via ORAL
  Filled 2019-12-06 (×4): qty 1

## 2019-12-06 MED ORDER — FUROSEMIDE 10 MG/ML IJ SOLN
20.0000 mg | Freq: Two times a day (BID) | INTRAMUSCULAR | Status: DC
  Administered 2019-12-07 – 2019-12-08 (×2): 20 mg via INTRAVENOUS
  Filled 2019-12-06 (×2): qty 2

## 2019-12-06 MED ORDER — ASPIRIN 81 MG PO CHEW
81.0000 mg | CHEWABLE_TABLET | ORAL | Status: AC
  Administered 2019-12-07: 06:00:00 81 mg via ORAL
  Filled 2019-12-06: qty 1

## 2019-12-06 NOTE — Plan of Care (Signed)
Nutrition Education Note  RD consulted for nutrition education regarding new onset CHF.  59 y/o male admitted with new CHF  Met with pt in room today. Pt reports good appetite and oral intake today and pta. Pt not interested in diet eduction at this time. Pt reports that he knows what he should be eating and that he does not have any questions. Pt requesting a menu.  RD provided "Low Sodium Nutrition Therapy" handout from the [employer]. RD discussed why it is important for patient to adhere to diet recommendations, and emphasized the role of fluids, foods to avoid, and importance of weighing self daily. Teach back method used.  Expect fair to poor compliance.  Body mass index is 30.67 kg/m. Pt meets criteria for obesity based on current BMI.  Current diet order is HH, patient is consuming approximately 100% of meals at this time. Labs and medications reviewed. No further nutrition interventions warranted at this time. RD contact information provided. If additional nutrition issues arise, please re-consult RD.   Koleen Distance MS, RD, LDN Please refer to [facility] for RD and/or RD on-call/weekend/after hours pager

## 2019-12-06 NOTE — Progress Notes (Signed)
ANTICOAGULATION CONSULT NOTE  Pharmacy Consult for heparin Indication: atrial fibrillation  No Known Allergies  Patient Measurements: Height: 5\' 9"  (175.3 cm) Weight: 94.9 kg (209 lb 4.8 oz) IBW/kg (Calculated) : 70.7 Heparin Dosing Weight: 90.4 kg  Vital Signs: Temp: 99.1 F (37.3 C) (04/27 1944) Temp Source: Oral (04/27 1944) BP: 152/109 (04/27 1944) Pulse Rate: 87 (04/27 1944)  Labs: Recent Labs    12/03/19 2152 12/04/19 0332 12/04/19 0427 12/04/19 1707 12/04/19 2057 12/05/19 0541 12/05/19 1444 12/05/19 2218  HGB 14.7  --   --   --   --  14.1  --   --   HCT 39.7  --   --   --   --  37.8*  --   --   PLT 262  --   --   --   --  225  --   --   APTT  --   --  36  --   --   --   --   --   LABPROT  --   --  14.9  --   --   --   --   --   INR  --   --  1.2  --   --   --   --   --   HEPARINUNFRC  --   --   --   --    < > 0.33 0.25* 0.27*  CREATININE 1.31*  --   --  1.36*  --  1.36*  --   --   TROPONINIHS 45* 44*  --   --   --  35*  --   --    < > = values in this interval not displayed.    Estimated Creatinine Clearance: 65.7 mL/min (A) (by C-G formula based on SCr of 1.36 mg/dL (H)).   Medical History: Past Medical History:  Diagnosis Date  . HTN (hypertension)    Assessment: 59 year old male with afib. Patient is not on anticoagulation at this time. Pharmacy consult for heparin drip with plan to transition to DOAC prior to discharge. Patient received Lovenox 40 mg SQ at 1315.  4/27 0541 HL 0.33  4/27 1444 HL 0.25 Will increase heparin infusion to 1650 units/hr 4/27 2218 HL 0.27 Will increase heparin infusion to 1800 units/hr  Goal of Therapy:  Heparin level 0.3-0.7 units/ml Monitor platelets by anticoagulation protocol: Yes   Plan:  Heparin level is subtherapeutic. Will increase heparin rate to 1800 and will recheck HL in 6 hours, CBC stable will continue to monitor.  5/27, PharmD 12/06/2019,12:39 AM

## 2019-12-06 NOTE — Progress Notes (Signed)
Progress Note  Patient Name: Gregory Howell Date of Encounter: 12/06/2019  Primary Cardiologist: new- Dr. Fletcher Anon  Subjective   Patient states feeling good today.  Denies chest pain or shortness of breath.  Able to ambulate without any symptoms.  Inpatient Medications    Scheduled Meds: . aspirin EC  81 mg Oral Daily  . carvedilol  12.5 mg Oral BID WC  . [START ON 12/07/2019] furosemide  20 mg Intravenous Q12H  . guaiFENesin  600 mg Oral BID  . hydrALAZINE  50 mg Oral Q8H  . isosorbide mononitrate  15 mg Oral Daily  . sodium chloride flush  3 mL Intravenous Q12H   Continuous Infusions: . sodium chloride    . sodium chloride 10 mL/hr at 12/05/19 1641  . heparin 1,800 Units/hr (12/06/19 0055)   PRN Meds: sodium chloride, sodium chloride, acetaminophen, chlorpheniramine-HYDROcodone, hydrALAZINE, labetalol, morphine injection, ondansetron (ZOFRAN) IV, oxyCODONE, sodium chloride flush, technetium TC 11M mebrofenin, traMADol, traZODone   Vital Signs    Vitals:   12/05/19 1635 12/05/19 1944 12/06/19 0431 12/06/19 0824  BP: (!) 143/101 (!) 152/109 (!) 142/105 (!) 140/101  Pulse: (!) 106 87 97 82  Resp: 18   18  Temp:  99.1 F (37.3 C) 98.6 F (37 C) 98.2 F (36.8 C)  TempSrc:  Oral Oral Oral  SpO2: 99% 98% 97% 94%  Weight:   94.2 kg   Height:        Intake/Output Summary (Last 24 hours) at 12/06/2019 1101 Last data filed at 12/06/2019 1019 Gross per 24 hour  Intake 1441.65 ml  Output 2050 ml  Net -608.35 ml   Last 3 Weights 12/06/2019 12/05/2019 12/04/2019  Weight (lbs) 207 lb 11.2 oz 209 lb 4.8 oz 211 lb 6.4 oz  Weight (kg) 94.212 kg 94.938 kg 95.89 kg      Telemetry    Atrial fibrillation, heart rate 101- Personally Reviewed  ECG    No new tracing obtained- Personally Reviewed  Physical Exam   GEN: No acute distress.   Neck: No JVD Cardiac:  Irregular irregular, no murmurs, rubs, or gallops.  Respiratory: Clear to auscultation bilaterally. GI: Soft,  nontender, non-distended  MS: No edema; No deformity. Neuro:  Nonfocal  Psych: Normal affect   Labs    High Sensitivity Troponin:   Recent Labs  Lab 12/03/19 2152 12/04/19 0332 12/05/19 0541 12/06/19 0712  TROPONINIHS 45* 44* 35* 27*      Chemistry Recent Labs  Lab 12/03/19 2152 12/03/19 2152 12/04/19 1707 12/05/19 0541 12/06/19 0712  NA 136   < > 136 137 136  K 4.3   < > 4.5 4.3 4.1  CL 104   < > 103 103 102  CO2 25   < > 24 27 26   GLUCOSE 117*   < > 104* 103* 103*  BUN 12   < > 16 17 20   CREATININE 1.31*   < > 1.36* 1.36* 1.57*  CALCIUM 8.4*   < > 8.5* 8.4* 8.4*  PROT 7.3  --   --   --   --   ALBUMIN 3.8  --   --   --   --   AST 27  --   --   --   --   ALT 43  --   --   --   --   ALKPHOS 122  --   --   --   --   BILITOT 1.5*  --   --  1.7*  --  GFRNONAA 59*   < > 56* 56* 47*  GFRAA >60   < > >60 >60 55*  ANIONGAP 7   < > 9 7 8    < > = values in this interval not displayed.     Hematology Recent Labs  Lab 12/03/19 2152 12/05/19 0541 12/06/19 0712  WBC 15.9* 13.2* 12.8*  RBC 4.95 4.68 5.08  HGB 14.7 14.1 15.2  HCT 39.7 37.8* 42.0  MCV 80.2 80.8 82.7  MCH 29.7 30.1 29.9  MCHC 37.0* 37.3* 36.2*  RDW 15.1 15.2 15.3  PLT 262 225 263    BNP Recent Labs  Lab 12/03/19 2152  BNP 660.0*     DDimer No results for input(s): DDIMER in the last 168 hours.   Radiology    NM Hepato W/EF  Result Date: 12/05/2019 CLINICAL DATA:  Right upper quadrant abdominal pain. EXAM: NUCLEAR MEDICINE HEPATOBILIARY IMAGING WITH GALLBLADDER EF TECHNIQUE: Sequential images of the abdomen were obtained out to 60 minutes following intravenous administration of radiopharmaceutical. After oral ingestion of Ensure, gallbladder ejection fraction was determined. At 60 min, normal ejection fraction is greater than 33%. RADIOPHARMACEUTICALS:  5.376 mCi Tc-88m  Choletec IV COMPARISON:  December 04, 2019. FINDINGS: Prompt uptake and biliary excretion of activity by the liver is seen.  Gallbladder activity is visualized, consistent with patency of cystic duct. Biliary activity passes into small bowel, consistent with patent common bile duct. Calculated gallbladder ejection fraction is 47%. No symptoms were reported during ingestion of Ensure. (Normal gallbladder ejection fraction with Ensure is greater than 33%.) IMPRESSION: Normal gallbladder ejection fracture after Ensure administration. Electronically Signed   By: December 06, 2019 M.D.   On: 12/05/2019 14:35   ECHOCARDIOGRAM COMPLETE  Result Date: 12/05/2019    ECHOCARDIOGRAM REPORT   Patient Name:   Gregory Howell Date of Exam: 12/04/2019 Medical Rec #:  12/06/2019      Height:       69.0 in Accession #:    [id number]     Weight:       211.4 lb Date of Birth:  02/15/1961     BSA:          2.115 m Patient Age:    58 years       BP:           147/121 mmHg Patient Gender: M              HR:           99 bpm. Exam Location:  [facility] Procedure: 2D Echo Indications:     CHF-Acute Diastolic I50.31  History:         Patient has no prior history of Echocardiogram examinations.                  Arrythmias:Atrial Fibrillation; Risk Factors:Current Smoker and                  Hypertension.  Sonographer:     06/06/1961 Referring Phys:  Johnathan Hausen [id number] Diagnosing Phys: Lennon Alstrom MD IMPRESSIONS  1. Left ventricular ejection fraction, by estimation, is 25 to 30%. The left ventricle has severely decreased function. The left ventricle demonstrates global hypokinesis. There is moderate left ventricular hypertrophy. Left ventricular diastolic parameters are indeterminate.  2. Right ventricular systolic function is severely reduced. The right ventricular size is normal. There is moderately elevated pulmonary artery systolic pressure. The estimated right ventricular systolic pressure is 64.8 mmHg.  3. Left atrial size was  mildly dilated.  4. Right atrial size was mildly dilated.  5. The mitral valve is normal in structure. Mild mitral valve  regurgitation.  6. The aortic valve was not well visualized. Aortic valve regurgitation is not visualized. No aortic stenosis is present.  7. The inferior vena cava is dilated in size with <50% respiratory variability, suggesting right atrial pressure of 15 mmHg. FINDINGS  Left Ventricle: Left ventricular ejection fraction, by estimation, is 25 to 30%. The left ventricle has severely decreased function. The left ventricle demonstrates global hypokinesis. The left ventricular internal cavity size was normal in size. There is moderate left ventricular hypertrophy. Left ventricular diastolic parameters are indeterminate. Right Ventricle: The right ventricular size is normal. No increase in right ventricular wall thickness. Right ventricular systolic function is severely reduced. There is moderately elevated pulmonary artery systolic pressure. The tricuspid regurgitant velocity is 3.53 m/s, and with an assumed right atrial pressure of 15 mmHg, the estimated right ventricular systolic pressure is 64.8 mmHg. Left Atrium: Left atrial size was mildly dilated. Right Atrium: Right atrial size was mildly dilated. Pericardium: There is no evidence of pericardial effusion. Mitral Valve: The mitral valve is normal in structure. Mild mitral valve regurgitation. Tricuspid Valve: The tricuspid valve is not well visualized. Tricuspid valve regurgitation is mild. Aortic Valve: The aortic valve was not well visualized. Aortic valve regurgitation is not visualized. No aortic stenosis is present. Pulmonic Valve: The pulmonic valve was not well visualized. Pulmonic valve regurgitation is not visualized. No evidence of pulmonic stenosis. Aorta: The aortic root is normal in size and structure. Pulmonary Artery: The pulmonary artery is not well seen. Venous: The inferior vena cava is dilated in size with less than 50% respiratory variability, suggesting right atrial pressure of 15 mmHg. IAS/Shunts: No atrial level shunt detected by color  flow Doppler.  LEFT VENTRICLE PLAX 2D LVIDd:         5.58 cm LVIDs:         4.72 cm LV PW:         1.49 cm LV IVS:        1.29 cm LVOT diam:     2.00 cm LVOT Area:     3.14 cm  LV Volumes (MOD) LV vol d, MOD A2C: 201.0 ml LV vol d, MOD A4C: 155.0 ml LV vol s, MOD A2C: 147.0 ml LV vol s, MOD A4C: 109.0 ml LV SV MOD A2C:     54.0 ml LV SV MOD A4C:     155.0 ml LV SV MOD BP:      55.9 ml RIGHT VENTRICLE         IVC TAPSE (M-mode): 1.2 cm  IVC diam: 2.89 cm LEFT ATRIUM             Index       RIGHT ATRIUM           Index LA diam:        5.00 cm 2.36 cm/m  RA Area:     24.50 cm LA Vol (A2C):   95.5 ml 45.15 ml/m RA Volume:   86.10 ml  40.70 ml/m LA Vol (A4C):   80.3 ml 37.96 ml/m LA Biplane Vol: 91.5 ml 43.26 ml/m   AORTA Ao Root diam: 3.30 cm TRICUSPID VALVE TR Peak grad:   49.8 mmHg TR Vmax:        353.00 cm/s  SHUNTS Systemic Diam: 2.00 cm Yvonne Kendall MD Electronically signed by Yvonne Kendall MD Signature Date/Time: 12/05/2019/6:35:26 AM  Final     Cardiac Studies   Echo 12/04/2019 1. Left ventricular ejection fraction, by estimation, is 25 to 30%. The  left ventricle has severely decreased function. The left ventricle  demonstrates global hypokinesis. There is moderate left ventricular  hypertrophy. Left ventricular diastolic  parameters are indeterminate.  2. Right ventricular systolic function is severely reduced. The right  ventricular size is normal. There is moderately elevated pulmonary artery  systolic pressure. The estimated right ventricular systolic pressure is  64.8 mmHg.  3. Left atrial size was mildly dilated.  4. Right atrial size was mildly dilated.  5. The mitral valve is normal in structure. Mild mitral valve  regurgitation.  6. The aortic valve was not well visualized. Aortic valve regurgitation  is not visualized. No aortic stenosis is present.  7. The inferior vena cava is dilated in size with <50% respiratory  variability, suggesting right atrial  pressure of 15 mmHg.   Patient Profile     59 y.o. male with history of hypertension, tobacco use, heart failure reduced ejection fraction last EF 25 to 30% being seen for systolic heart failure and A. fib with RVR.  Assessment & Plan    1.  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  N.p.o. after midnight.  May be deferred to Friday if creatinine worsens tomorrow. -Increase Coreg to 12.5 mg twice daily,  -Continue hydralazine, Imdur.  2.  New onset atrial fibrillation -Currently in atrial fibrillation -Increase Coreg to 12.5 mg twice daily -Continue heparin drip.  Will need to be transitioned to an NOAC upon DC.  3.  Tobacco use -Cessation recommended.      Signed, Debbe Odea, MD  12/06/2019, 11:01 AM

## 2019-12-06 NOTE — Progress Notes (Signed)
All medications adminstered by Nori Riis Student Nurse were supervised by Jake Bathe RN

## 2019-12-06 NOTE — Progress Notes (Addendum)
PROGRESS NOTE  Gregory Howell GQB:[id number] DOB: [birth year] DOA: 12/04/2019 PCP: [facility]  Brief History   59 year old man PMH essential hypertension presented with shortness of breath, right-sided upper abdominal pain.  Admitted for presumed acute CHF,  A & P  Acute systolic CHF.  New diagnosis.  LVEF 25-30% with global LV hypokinesis.  Severely reduced RV systolic function.  Moderate pulmonary hypertension. --New diagnosis.   --Continue diuresis.   --Continue management per cardiology including carvedilol, Imdur, hydralazine  New diagnosis atrial fibrillation --IV heparin until after invasive procedures completed --Outpatient sleep study recommended  Demand ischemia  AKI -- may improve with diuresis. --check BMP in AM  Hypertensive urgency --Continue carvedilol, hydralazine  SIRS without infection.  Imaging unremarkable.  Right upper quadrant pain probably secondary to congestive hepatopathy.  Antibiotics stopped.  Disposition Plan:   Status is: Inpatient  Remains inpatient appropriate because:Ongoing diagnostic testing needed not appropriate for outpatient work up   Dispo: The patient is from: Home              Anticipated d/c is to: Home              Anticipated d/c date is: 2 days              Patient currently is not medically stable to d/c.  DVT prophylaxis: heparin gtt Code Status: Full Family Communication: none    Brendia Sacks, MD  [facility] Direct contact: see [email or web address] (further directions at bottom of note if needed) 7PM-7AM contact night coverage as at bottom of note 12/06/2019, 6:00 PM  LOS: 2 days   Significant Hospital Events   .    Consults:  . Cardiology    Procedures:  .   Significant Diagnostic Tests:  Marland Kitchen    Micro Data:  .    Antimicrobials:  .   Interval History/Subjective  Feels better.  Breathing fine.  No chest pain.  Objective   Vitals:  Vitals:   12/06/19 1400 12/06/19 1504  BP: 123/78  (!) 120/93  Pulse: 76 94  Resp:  18  Temp:  97.7 F (36.5 C)  SpO2: 97% 97%    Exam:  Constitutional.  Appears calm, comfortable. Respiratory.  Clear to auscultation bilaterally.  No wheezes, rales or rhonchi.  Respiratory effort. Cardiovascular.  Regular rate and rhythm.  No murmur, rub or gallop.  No lower extremity edema.  Telemetry atrial fibrillation. Psychiatric.  Grossly normal mood and affect.  Speech fluent and appropriate.  I have personally reviewed the following:   Today's Data  . Creatinine slightly higher 1.57 today.  Remainder BMP unremarkable.  Scheduled Meds: . aspirin EC  81 mg Oral Daily  . carvedilol  12.5 mg Oral BID WC  . [START ON 12/07/2019] furosemide  20 mg Intravenous Q12H  . guaiFENesin  600 mg Oral BID  . hydrALAZINE  50 mg Oral Q8H  . isosorbide mononitrate  15 mg Oral Daily  . sodium chloride flush  3 mL Intravenous Q12H   Continuous Infusions: . sodium chloride    . sodium chloride Stopped (12/05/19 1710)  . heparin 1,800 Units/hr (12/06/19 1700)    Principal Problem:   Right upper quadrant abdominal pain Active Problems:   Hypertensive urgency   Tobacco abuse   Leukocytosis   Atrial fibrillation with RVR (HCC)   Acute HFrEF (heart failure with reduced ejection fraction) (HCC)   LOS: 2 days   How to contact the [facility] Attending or Consulting provider 7A -  7P or covering provider during after hours [place], for this patient?  1. Check the care team in [facility] and look for a) attending/consulting [facility] provider listed and b) the [facility] team listed 2. Log into [email or web address] and use [place]'s universal password to access. If you do not have the password, please contact the hospital operator. 3. Locate the [facility] provider you are looking for under [facility] and page to a number that you can be directly reached. 4. If you still have difficulty reaching the provider, please page the [facility] (Director on Call) for the Hospitalists listed on amion for  assistance.

## 2019-12-06 NOTE — Progress Notes (Signed)
ANTICOAGULATION CONSULT NOTE  Pharmacy Consult for heparin Indication: atrial fibrillation  No Known Allergies  Patient Measurements: Height: 5\' 9"  (175.3 cm) Weight: 94.2 kg (207 lb 11.2 oz) IBW/kg (Calculated) : 70.7 Heparin Dosing Weight: 90.4 kg  Vital Signs: Temp: 97.7 F (36.5 C) (04/28 1504) Temp Source: Oral (04/28 1504) BP: 120/93 (04/28 1504) Pulse Rate: 94 (04/28 1504)  Labs: Recent Labs    12/03/19 2152 12/03/19 2152 12/04/19 0332 12/04/19 0427 12/04/19 1707 12/04/19 2057 12/05/19 0541 12/05/19 1444 12/05/19 2218 12/06/19 0712 12/06/19 1407  HGB 14.7   < >  --   --   --   --  14.1  --   --  15.2  --   HCT 39.7  --   --   --   --   --  37.8*  --   --  42.0  --   PLT 262  --   --   --   --   --  225  --   --  263  --   APTT  --   --   --  36  --   --   --   --   --   --   --   LABPROT  --   --   --  14.9  --   --   --   --   --   --   --   INR  --   --   --  1.2  --   --   --   --   --   --   --   HEPARINUNFRC  --   --   --   --   --    < > 0.33   < > 0.27* 0.46 0.38  CREATININE 1.31*   < >  --   --  1.36*  --  1.36*  --   --  1.57*  --   TROPONINIHS 45*   < > 44*  --   --   --  35*  --   --  27*  --    < > = values in this interval not displayed.    Estimated Creatinine Clearance: 56.7 mL/min (A) (by C-G formula based on SCr of 1.57 mg/dL (H)).   Medical History: Past Medical History:  Diagnosis Date  . HTN (hypertension)    Assessment: 59 year old male with afib. Patient is not on anticoagulation at this time. Pharmacy consult for heparin drip with plan to transition to DOAC prior to discharge. Patient received Lovenox 40 mg SQ at 1315.  4/27 0541 HL 0.33  4/27 1444 HL 0.25 Will increase heparin infusion to 1650 units/hr 4/27 2218 HL 0.27 Will increase heparin infusion to 1800 units/hr 4/28 0712 HL 0.46 4/28 1407 HL 0.38  Goal of Therapy:  Heparin level 0.3-0.7 units/ml Monitor platelets by anticoagulation protocol: Yes   Plan:  Heparin  level is therapeutic x 2. Will continue heparin rate at 1800 units/hr. HL and CBC with morning labs.  5/28, PharmD 12/06/2019,3:34 PM

## 2019-12-06 NOTE — Progress Notes (Signed)
ANTICOAGULATION CONSULT NOTE  Pharmacy Consult for heparin Indication: atrial fibrillation  No Known Allergies  Patient Measurements: Height: 5\' 9"  (175.3 cm) Weight: 94.2 kg (207 lb 11.2 oz) IBW/kg (Calculated) : 70.7 Heparin Dosing Weight: 90.4 kg  Vital Signs: Temp: 98.6 F (37 C) (04/28 0431) Temp Source: Oral (04/28 0431) BP: 142/105 (04/28 0431) Pulse Rate: 97 (04/28 0431)  Labs: Recent Labs    12/03/19 2152 12/03/19 2152 12/04/19 0332 12/04/19 0427 12/04/19 1707 12/04/19 2057 12/05/19 0541 12/05/19 0541 12/05/19 1444 12/05/19 2218 12/06/19 0712  HGB 14.7   < >  --   --   --   --  14.1  --   --   --  15.2  HCT 39.7  --   --   --   --   --  37.8*  --   --   --  42.0  PLT 262  --   --   --   --   --  225  --   --   --  263  APTT  --   --   --  36  --   --   --   --   --   --   --   LABPROT  --   --   --  14.9  --   --   --   --   --   --   --   INR  --   --   --  1.2  --   --   --   --   --   --   --   HEPARINUNFRC  --   --   --   --   --    < > 0.33   < > 0.25* 0.27* 0.46  CREATININE 1.31*   < >  --   --  1.36*  --  1.36*  --   --   --  1.57*  TROPONINIHS 45*   < > 44*  --   --   --  35*  --   --   --  27*   < > = values in this interval not displayed.    Estimated Creatinine Clearance: 56.7 mL/min (A) (by C-G formula based on SCr of 1.57 mg/dL (H)).   Medical History: Past Medical History:  Diagnosis Date  . HTN (hypertension)    Assessment: 59 year old male with afib. Patient is not on anticoagulation at this time. Pharmacy consult for heparin drip with plan to transition to DOAC prior to discharge. Patient received Lovenox 40 mg SQ at 1315.  4/27 0541 HL 0.33  4/27 1444 HL 0.25 Will increase heparin infusion to 1650 units/hr 4/27 2218 HL 0.27 Will increase heparin infusion to 1800 units/hr 4/28 0712 HL 0.46  Goal of Therapy:  Heparin level 0.3-0.7 units/ml Monitor platelets by anticoagulation protocol: Yes   Plan:  Heparin level is  therapeutic. Will continue heparin rate at 1800 and will recheck HL in 6 hours, CBC stable will continue to monitor.  5/28, PharmD, BCPS 12/06/2019,7:58 AM

## 2019-12-07 ENCOUNTER — Encounter
Admission: EM | Disposition: A | Discharge status: Home / Self Care | Payer: Self-pay | Source: Home / Self Care | Attending: Family Medicine

## 2019-12-07 ENCOUNTER — Encounter: Payer: Self-pay | Admitting: Cardiology

## 2019-12-07 DIAGNOSIS — I2510 Atherosclerotic heart disease of native coronary artery without angina pectoris: Secondary | ICD-10-CM | POA: Diagnosis not present

## 2019-12-07 DIAGNOSIS — I4891 Unspecified atrial fibrillation: Secondary | ICD-10-CM | POA: Diagnosis not present

## 2019-12-07 DIAGNOSIS — I5043 Acute on chronic combined systolic (congestive) and diastolic (congestive) heart failure: Secondary | ICD-10-CM

## 2019-12-07 DIAGNOSIS — I428 Other cardiomyopathies: Secondary | ICD-10-CM | POA: Diagnosis not present

## 2019-12-07 DIAGNOSIS — I5021 Acute systolic (congestive) heart failure: Secondary | ICD-10-CM | POA: Diagnosis not present

## 2019-12-07 DIAGNOSIS — N179 Acute kidney failure, unspecified: Secondary | ICD-10-CM

## 2019-12-07 DIAGNOSIS — Z720 Tobacco use: Secondary | ICD-10-CM | POA: Diagnosis not present

## 2019-12-07 HISTORY — PX: RIGHT/LEFT HEART CATH AND CORONARY ANGIOGRAPHY: CATH118266

## 2019-12-07 LAB — BASIC METABOLIC PANEL
Anion gap: 7 (ref 5–15)
BUN: 20 mg/dL (ref 6–20)
CO2: 25 mmol/L (ref 22–32)
Calcium: 8.7 mg/dL — ABNORMAL LOW (ref 8.9–10.3)
Chloride: 105 mmol/L (ref 98–111)
Creatinine, Ser: 1.44 mg/dL — ABNORMAL HIGH (ref 0.61–1.24)
GFR calc Af Amer: >60 mL/min (ref >60)
GFR calc non Af Amer: 52 mL/min — ABNORMAL LOW (ref >60)
Glucose, Bld: 97 mg/dL (ref 70–99)
Potassium: 4.4 mmol/L (ref 3.5–5.1)
Sodium: 137 mmol/L (ref 135–145)

## 2019-12-07 LAB — CBC
HCT: 40.1 % (ref 39.0–52.0)
Hemoglobin: 15 g/dL (ref 13.0–17.0)
MCH: 30.2 pg (ref 26.0–34.0)
MCHC: 37.4 g/dL — ABNORMAL HIGH (ref 30.0–36.0)
MCV: 80.8 fL (ref 80.0–100.0)
Platelets: 266 10*3/uL (ref 150–400)
RBC: 4.96 MIL/uL (ref 4.22–5.81)
RDW: 15 % (ref 11.5–15.5)
WBC: 12.9 10*3/uL — ABNORMAL HIGH (ref 4.0–10.5)
nRBC: 0 % (ref 0.0–0.2)

## 2019-12-07 LAB — HEPARIN LEVEL (UNFRACTIONATED): Heparin Unfractionated: 0.47 IU/mL (ref 0.30–0.70)

## 2019-12-07 SURGERY — RIGHT/LEFT HEART CATH AND CORONARY ANGIOGRAPHY
Anesthesia: Moderate Sedation

## 2019-12-07 MED ORDER — FENTANYL CITRATE (PF) 100 MCG/2ML IJ SOLN
INTRAMUSCULAR | Status: DC | PRN
  Administered 2019-12-07: 50 ug via INTRAVENOUS
  Administered 2019-12-07: 25 ug via INTRAVENOUS

## 2019-12-07 MED ORDER — HEPARIN (PORCINE) IN NACL 1000-0.9 UT/500ML-% IV SOLN
INTRAVENOUS | Status: AC
  Filled 2019-12-07: qty 1000

## 2019-12-07 MED ORDER — LOSARTAN POTASSIUM 25 MG PO TABS
25.0000 mg | ORAL_TABLET | Freq: Every day | ORAL | Status: DC
  Administered 2019-12-08: 09:00:00 25 mg via ORAL
  Filled 2019-12-07 (×2): qty 1

## 2019-12-07 MED ORDER — SODIUM CHLORIDE 0.9% FLUSH: 3.0000 mL | INTRAVENOUS | Status: DC | PRN

## 2019-12-07 MED ORDER — HEPARIN (PORCINE) IN NACL 1000-0.9 UT/500ML-% IV SOLN
INTRAVENOUS | Status: DC | PRN
  Administered 2019-12-07: 500 mL

## 2019-12-07 MED ORDER — LABETALOL HCL 5 MG/ML IV SOLN
INTRAVENOUS | Status: AC
  Administered 2019-12-07: 12:00:00 10 mg via INTRAVENOUS
  Filled 2019-12-07: qty 4

## 2019-12-07 MED ORDER — SODIUM CHLORIDE 0.9% FLUSH
3.0000 mL | Freq: Two times a day (BID) | INTRAVENOUS | Status: DC
  Administered 2019-12-07: 22:00:00 3 mL via INTRAVENOUS

## 2019-12-07 MED ORDER — SODIUM CHLORIDE 0.9 % IV SOLN: 250.0000 mL | INTRAVENOUS | Status: DC | PRN

## 2019-12-07 MED ORDER — ATORVASTATIN CALCIUM 80 MG PO TABS
80.0000 mg | ORAL_TABLET | Freq: Every day | ORAL | Status: DC
  Administered 2019-12-07: 80 mg via ORAL
  Filled 2019-12-07 (×2): qty 1

## 2019-12-07 MED ORDER — MIDAZOLAM HCL 2 MG/2ML IJ SOLN
INTRAMUSCULAR | Status: AC
  Filled 2019-12-07: qty 2

## 2019-12-07 MED ORDER — LABETALOL HCL 5 MG/ML IV SOLN: 10.0000 mg | INTRAVENOUS | Status: AC | PRN

## 2019-12-07 MED ORDER — SODIUM CHLORIDE 0.9 % IV SOLN: INTRAVENOUS | Status: DC

## 2019-12-07 MED ORDER — FENTANYL CITRATE (PF) 100 MCG/2ML IJ SOLN
INTRAMUSCULAR | Status: AC
  Filled 2019-12-07: qty 2

## 2019-12-07 MED ORDER — ONDANSETRON HCL 4 MG/2ML IJ SOLN: 4.0000 mg | Freq: Four times a day (QID) | INTRAMUSCULAR | Status: DC | PRN

## 2019-12-07 MED ORDER — MIDAZOLAM HCL 2 MG/2ML IJ SOLN
INTRAMUSCULAR | Status: DC | PRN
  Administered 2019-12-07 (×2): 1 mg via INTRAVENOUS

## 2019-12-07 MED ORDER — HYDRALAZINE HCL 20 MG/ML IJ SOLN: 10.0000 mg | INTRAMUSCULAR | Status: AC | PRN

## 2019-12-07 MED ORDER — IOHEXOL 300 MG/ML  SOLN
INTRAMUSCULAR | Status: DC | PRN
  Administered 2019-12-07: 80 mL

## 2019-12-07 MED ORDER — ACETAMINOPHEN 325 MG PO TABS: 650.0000 mg | ORAL_TABLET | ORAL | Status: DC | PRN

## 2019-12-07 SURGICAL SUPPLY — 13 items
CATH INFINITI 5FR ANG PIGTAIL (CATHETERS) ×2 IMPLANT
CATH INFINITI 5FR JL4 (CATHETERS) ×2 IMPLANT
CATH INFINITI JR4 5F (CATHETERS) ×2 IMPLANT
CATH SWANZ 7F THERMO (CATHETERS) ×2 IMPLANT
DEVICE CLOSURE MYNXGRIP 5F (Vascular Products) ×2 IMPLANT
GUIDEWIRE EMER 3M J .025X150CM (WIRE) ×2 IMPLANT
KIT MANI 3VAL PERCEP (MISCELLANEOUS) ×3 IMPLANT
NDL PERC 18GX7CM (NEEDLE) IMPLANT
NEEDLE PERC 18GX7CM (NEEDLE) ×3 IMPLANT
PACK CARDIAC CATH (CUSTOM PROCEDURE TRAY) ×3 IMPLANT
SHEATH AVANTI 5FR X 11CM (SHEATH) ×2 IMPLANT
SHEATH AVANTI 7FRX11 (SHEATH) ×2 IMPLANT
WIRE GUIDERIGHT .035X150 (WIRE) ×3 IMPLANT

## 2019-12-07 NOTE — Progress Notes (Signed)
ANTICOAGULATION CONSULT NOTE  Pharmacy Consult for heparin Indication: atrial fibrillation  No Known Allergies  Patient Measurements: Height: 5\' 9"  (175.3 cm) Weight: 94.1 kg (207 lb 6.4 oz) IBW/kg (Calculated) : 70.7 Heparin Dosing Weight: 90.4 kg  Vital Signs: Temp: 98 F (36.7 C) (04/29 0326) Temp Source: Oral (04/29 0326) BP: 145/103 (04/29 0326) Pulse Rate: 88 (04/29 0326)  Labs: Recent Labs    12/04/19 1707 12/04/19 2057 12/05/19 0541 12/05/19 1444 12/06/19 0712 12/06/19 1407 12/07/19 0600  HGB  --    < > 14.1  --  15.2  --  15.0  HCT  --   --  37.8*  --  42.0  --  40.1  PLT  --   --  225  --  263  --  266  HEPARINUNFRC  --    < > 0.33   < > 0.46 0.38 0.47  CREATININE 1.36*  --  1.36*  --  1.57*  --   --   TROPONINIHS  --   --  35*  --  27*  --   --    < > = values in this interval not displayed.    Estimated Creatinine Clearance: 56.7 mL/min (A) (by C-G formula based on SCr of 1.57 mg/dL (H)).   Medical History: Past Medical History:  Diagnosis Date  . HTN (hypertension)    Assessment: 59 year old male with afib. Patient is not on anticoagulation at this time. Pharmacy consult for heparin drip with plan to transition to DOAC prior to discharge. Patient received Lovenox 40 mg SQ at 1315.  4/27 0541 HL 0.33  4/27 1444 HL 0.25 Will increase heparin infusion to 1650 units/hr 4/27 2218 HL 0.27 Will increase heparin infusion to 1800 units/hr 4/28 0712 HL 0.46 4/28 1407 HL 0.38 4/29 0600 HL 0.47, therapeutic, CBC stable.    Goal of Therapy:  Heparin level 0.3-0.7 units/ml Monitor platelets by anticoagulation protocol: Yes   Plan:  Heparin level is therapeutic x 3. Will continue heparin rate at 1800 units/hr. HL and CBC with morning labs.  5/29, PharmD 12/07/2019,6:30 AM

## 2019-12-07 NOTE — Plan of Care (Signed)
  Problem: Education: Goal: Knowledge of General Education information will improve Description: Including pain rating scale, medication(s)/side effects and non-pharmacologic comfort measures Outcome: Progressing   Problem: Health Behavior/Discharge Planning: Goal: Ability to manage health-related needs will improve Outcome: Progressing   Problem: Clinical Measurements: Goal: Ability to maintain clinical measurements within normal limits will improve Outcome: Progressing Goal: Respiratory complications will improve Outcome: Progressing Goal: Cardiovascular complication will be avoided Outcome: Progressing   Problem: Education: Goal: Ability to demonstrate management of disease process will improve Outcome: Progressing   Problem: Activity: Goal: Capacity to carry out activities will improve Outcome: Progressing   Problem: Pain Managment: Goal: General experience of comfort will improve Outcome: Completed/Met

## 2019-12-07 NOTE — Progress Notes (Signed)
Patient returned to room from procedure. Right femoral site assessed, C/D/I. VSS.

## 2019-12-07 NOTE — Progress Notes (Signed)
Progress Note  Patient Name: Gregory Howell Date of Encounter: 12/07/2019  Primary Cardiologist: [facility]  Subjective   Denies any significant chest pain Minimal walking around the unit Feels breathing is getting closer to his baseline Denies PND orthopnea  Telemetry reviewed, atrial fibrillation  Inpatient Medications    Scheduled Meds: . [MAR Hold] aspirin EC  81 mg Oral Daily  . atorvastatin  80 mg Oral Daily  . [MAR Hold] carvedilol  12.5 mg Oral BID WC  . [MAR Hold] furosemide  20 mg Intravenous Q12H  . [MAR Hold] guaiFENesin  600 mg Oral BID  . [MAR Hold] hydrALAZINE  50 mg Oral Q8H  . [MAR Hold] isosorbide mononitrate  15 mg Oral Daily  . labetalol      . [START ON 12/08/2019] losartan  25 mg Oral Daily  . [MAR Hold] sodium chloride flush  3 mL Intravenous Q12H  . sodium chloride flush  3 mL Intravenous Q12H  . sodium chloride flush  3 mL Intravenous Q12H   Continuous Infusions: . [MAR Hold] sodium chloride    . [MAR Hold] sodium chloride 10 mL/hr at 12/07/19 0911  . sodium chloride    . [START ON 12/08/2019] sodium chloride    . sodium chloride    . heparin Stopped (12/07/19 0911)   PRN Meds: [MAR Hold] sodium chloride, [MAR Hold] sodium chloride, sodium chloride, sodium chloride, [MAR Hold] acetaminophen, acetaminophen, [MAR Hold] chlorpheniramine-HYDROcodone, hydrALAZINE, [MAR Hold] hydrALAZINE, labetalol, [MAR Hold] labetalol, [MAR Hold]  morphine injection, [MAR Hold] ondansetron (ZOFRAN) IV, ondansetron (ZOFRAN) IV, [MAR Hold] oxyCODONE, [MAR Hold] sodium chloride flush, sodium chloride flush, sodium chloride flush, [MAR Hold] technetium TC 66M mebrofenin, [MAR Hold] traMADol, [MAR Hold] traZODone   Vital Signs    Vitals:   12/07/19 0712 12/07/19 0905 12/07/19 1125 12/07/19 1140  BP: (!) 131/98 (!) 158/106 (!) 141/104 (!) 148/88  Pulse: 71 77 76 77  Resp: 18 19 15 14   Temp: 98.4 F (36.9 C) 97.7 F (36.5 C)    TempSrc: Oral Oral    SpO2:  98% 99% 98% 95%  Weight:      Height:        Intake/Output Summary (Last 24 hours) at 12/07/2019 1152 Last data filed at 12/07/2019 0836 Gross per 24 hour  Intake 916.92 ml  Output 925 ml  Net -8.08 ml   Last 3 Weights 12/07/2019 12/06/2019 12/05/2019  Weight (lbs) 207 lb 6.4 oz 207 lb 11.2 oz 209 lb 4.8 oz  Weight (kg) 94.076 kg 94.212 kg 94.938 kg      Telemetry    Atrial fibrillation- Personally Reviewed  ECG     - Personally Reviewed  Physical Exam   GEN: No acute distress.   Neck:  JVD 10+ Cardiac irregularly irregular, no murmurs, rubs, or gallops.  Respiratory: Clear to auscultation bilaterally. GI: Soft, nontender, non-distended  MS: No edema; No deformity. Neuro:  Nonfocal  Psych: Normal affect   Labs    High Sensitivity Troponin:   Recent Labs  Lab 12/03/19 2152 12/04/19 0332 12/05/19 0541 12/06/19 0712  TROPONINIHS 45* 44* 35* 27*      Chemistry Recent Labs  Lab 12/03/19 2152 12/04/19 1707 12/05/19 0541 12/06/19 0712 12/07/19 0600  NA 136   < > 137 136 137  K 4.3   < > 4.3 4.1 4.4  CL 104   < > 103 102 105  CO2 25   < > 27 26 25   GLUCOSE 117*   < >  103* 103* 97  BUN 12   < > 17 20 20   CREATININE 1.31*   < > 1.36* 1.57* 1.44*  CALCIUM 8.4*   < > 8.4* 8.4* 8.7*  PROT 7.3  --   --   --   --   ALBUMIN 3.8  --   --   --   --   AST 27  --   --   --   --   ALT 43  --   --   --   --   ALKPHOS 122  --   --   --   --   BILITOT 1.5*  --  1.7*  --   --   GFRNONAA 59*   < > 56* 47* 52*  GFRAA >60   < > >60 55* >60  ANIONGAP 7   < > 7 8 7    < > = values in this interval not displayed.     Hematology Recent Labs  Lab 12/05/19 0541 12/06/19 0712 12/07/19 0600  WBC 13.2* 12.8* 12.9*  RBC 4.68 5.08 4.96  HGB 14.1 15.2 15.0  HCT 37.8* 42.0 40.1  MCV 80.8 82.7 80.8  MCH 30.1 29.9 30.2  MCHC 37.3* 36.2* 37.4*  RDW 15.2 15.3 15.0  PLT 225 263 266    BNP Recent Labs  Lab 12/03/19 2152  BNP 660.0*     DDimer No results for input(s):  DDIMER in the last 168 hours.   Radiology    CARDIAC CATHETERIZATION  Result Date: 12/07/2019  2nd Mrg lesion is 100% stenosed.  Dist Cx lesion is 60% stenosed.  1st Mrg lesion is 40% stenosed.  Ost RCA lesion is 50% stenosed.  RPAV lesion is 60% stenosed.  Mid RCA lesion is 30% stenosed.  LV end diastolic pressure is mildly elevated.  There is severe left ventricular systolic dysfunction.  The left ventricular ejection fraction is less than 25% by visual estimate.  Hemodynamic findings consistent with mild pulmonary hypertension.    NM Hepato W/EF  Result Date: 12/05/2019 CLINICAL DATA:  Right upper quadrant abdominal pain. EXAM: NUCLEAR MEDICINE HEPATOBILIARY IMAGING WITH GALLBLADDER EF TECHNIQUE: Sequential images of the abdomen were obtained out to 60 minutes following intravenous administration of radiopharmaceutical. After oral ingestion of Ensure, gallbladder ejection fraction was determined. At 60 min, normal ejection fraction is greater than 33%. RADIOPHARMACEUTICALS:  5.376 mCi Tc-53m  Choletec IV COMPARISON:  December 04, 2019. FINDINGS: Prompt uptake and biliary excretion of activity by the liver is seen. Gallbladder activity is visualized, consistent with patency of cystic duct. Biliary activity passes into small bowel, consistent with patent common bile duct. Calculated gallbladder ejection fraction is 47%. No symptoms were reported during ingestion of Ensure. (Normal gallbladder ejection fraction with Ensure is greater than 33%.) IMPRESSION: Normal gallbladder ejection fracture after Ensure administration. Electronically Signed   By: 84m M.D.   On: 12/05/2019 14:35    Cardiac Studies   Echocardiogram Left ventricular ejection fraction, by estimation, is 25 to 30%. The  left ventricle has severely decreased function. The left ventricle  demonstrates global hypokinesis. There is moderate left ventricular  hypertrophy.  Patient Profile     59 y.o. male with PMH  significant for HFrEF (EF 25-30%), hypertension, tobacco use,previously documentedmedication noncompliance,h/osupply demand ischemia/chest pain, andwho is being seen today for the evaluation of acute heart failure.   Assessment & Plan   1.  Nonischemic cardiomyopathy Nonobstructive disease on cardiac catheterization today No intervention at this time Stressed  the importance of alcohol and smoking cessation --Would continue isosorbide with increased dose up to 30 mg daily -Continue carvedilol, hydralazine Start losartan 25 mg tomorrow -Lasix 40 daily, initiate tomorrow  2.  Nonobstructive coronary disease/stable angina Aspirin, Lipitor 80 daily, Imdur Stressed importance of smoking cessation, alcohol cessation  3.  Persistent atrial fibrillation/flutter Eliquis 5 twice daily, could start later this evening post catheterization Rate control with carvedilol twice daily --He will likely need initiation of antiarrhythmic as outpatient and then cardioversion  4.  Smoker We have encouraged him to continue to work on weaning his cigarettes and smoking cessation. He will continue to work on this and does not want any assistance with chantix.   5.  Leukocytosis/abdominal pain Imaging unremarkable Antibiotics held   Total encounter time more than 45 minutes  Greater than 50% was spent in counseling and coordination of care with the patient   For questions or updates, please contact [facility] Please consult [email or web address] for contact info under        Signed, Julien Nordmann, MD  12/07/2019, 11:52 AM

## 2019-12-07 NOTE — Progress Notes (Signed)
PROGRESS NOTE  Gregory Howell VPX:[id number] DOB: [birth year] DOA: 12/04/2019 [facility]: [facility]  Brief History   59 year old man PMH essential hypertension presented with shortness of breath, right-sided upper abdominal pain.  Admitted for presumed acute CHF,  A & P  Acute systolic CHF.  Nonischemic cardiomyopathy LVEF 25-30% with global LV hypokinesis.  Severely reduced RV systolic function.  Moderate pulmonary hypertension. --Nonobstructive disease on catheterization per cardiology.  No intervention recommended. --Stop smoking, stop alcohol consumption --Isosorbide being increased today.  Continue carvedilol and hydralazine.  Start losartan tomorrow.  Start Lasix tomorrow.  Nonobstructive coronary artery disease, stable angina --Continue aspirin, Lipitor, Imdur  New diagnosis atrial fibrillation, persistent --Eliquis 5 mg twice daily per cardiology, continue rate control with carvedilol.  Could consider outpatient antiarrhythmic and then cardioversion. --Outpatient sleep study recommended  Smoker.  Recommend cessation.  AKI --Improving.  Expect spontaneous resolution.  Hypertensive urgency, resolved --Continue carvedilol, hydralazine  SIRS without infection.  Imaging unremarkable.  Right upper quadrant pain probably secondary to congestive hepatopathy.  Antibiotics stopped.  Disposition Plan:   Status is: Inpatient  Remains inpatient appropriate because: Ongoing adjustment of cardiac medications as per cardiology   Dispo: The patient is from: Home              Anticipated d/c is to: Home              Anticipated d/c date is: 4/30              Patient currently is not medically stable to d/c.  DVT prophylaxis: heparin gtt none apixaban Code Status: Full Family Communication: Wife at bedside    Murray Hodgkins, MD  [facility] Direct contact: see [email or web address] (further directions at bottom of note if needed) 7PM-7AM contact night coverage as at  bottom of note 12/07/2019, 2:06 PM  LOS: 3 days   Significant Hospital Events   .    Consults:  . Cardiology    Procedures:  .   Significant Diagnostic Tests:  . Left heart cath showed nonobstructive coronary disease.   Micro Data:  .    Antimicrobials:  .   Interval History/Subjective  Feels okay.  No chest pain or shortness of breath.  Objective   Vitals:  Vitals:   12/07/19 1200 12/07/19 1207  BP: (!) 150/107 137/85  Pulse: 72 78  Resp: 18 14  Temp:    SpO2: 98% 96%    Exam:  Constitutional.  Appears calm, comfortable. Psychiatric.  Grossly normal mood and affect.  Speech fluent and appropriate. Cardiovascular.  Regular rate and rhythm.  No murmur, rub or gallop.  No lower extremity edema. Respiratory.  Clear to auscultation bilaterally.  No wheezes, rales or rhonchi.  Normal respiratory effort.  I have personally reviewed the following:   Today's Data  . Urine output 650.  -1.8 L since admission. . Creatinine down to 1.44.  Potassium within normal limits.  CBC unremarkable.  Scheduled Meds: . aspirin EC  81 mg Oral Daily  . atorvastatin  80 mg Oral Daily  . carvedilol  12.5 mg Oral BID WC  . furosemide  20 mg Intravenous Q12H  . guaiFENesin  600 mg Oral BID  . hydrALAZINE  50 mg Oral Q8H  . isosorbide mononitrate  15 mg Oral Daily  . [START ON 12/08/2019] losartan  25 mg Oral Daily  . sodium chloride flush  3 mL Intravenous Q12H  . sodium chloride flush  3 mL Intravenous Q12H  . sodium chloride  flush  3 mL Intravenous Q12H   Continuous Infusions: . sodium chloride    . sodium chloride 10 mL/hr at 12/07/19 0911  . sodium chloride    . heparin Stopped (12/07/19 0911)    Principal Problem:   Acute HFrEF (heart failure with reduced ejection fraction) (HCC) Active Problems:   Hypertensive urgency   Tobacco abuse   Leukocytosis   Atrial fibrillation with RVR (HCC)   Nonobstructive atherosclerosis of coronary artery   AKI (acute kidney injury)  (HCC)   LOS: 3 days   How to contact the [facility] Attending or Consulting provider 7A - 7P or covering provider during after hours 7P -7A, for this patient?  1. Check the care team in [facility] and look for a) attending/consulting [facility] provider listed and b) the [facility] team listed 2. Log into [email or web address] and use [facility]'s universal password to access. If you do not have the password, please contact the hospital operator. 3. Locate the [facility] provider you are looking for under [facility] and page to a number that you can be directly reached. 4. If you still have difficulty reaching the provider, please page the [facility] (Director on Call) for the Hospitalists listed on amion for assistance.

## 2019-12-07 NOTE — Progress Notes (Signed)
Patient requesting to have no visitors. Chart updated and medical mall notified.

## 2019-12-08 DIAGNOSIS — I160 Hypertensive urgency: Secondary | ICD-10-CM

## 2019-12-08 DIAGNOSIS — I248 Other forms of acute ischemic heart disease: Secondary | ICD-10-CM

## 2019-12-08 DIAGNOSIS — I2510 Atherosclerotic heart disease of native coronary artery without angina pectoris: Secondary | ICD-10-CM | POA: Diagnosis not present

## 2019-12-08 DIAGNOSIS — N179 Acute kidney failure, unspecified: Secondary | ICD-10-CM | POA: Diagnosis not present

## 2019-12-08 DIAGNOSIS — I5021 Acute systolic (congestive) heart failure: Secondary | ICD-10-CM | POA: Diagnosis not present

## 2019-12-08 DIAGNOSIS — I4891 Unspecified atrial fibrillation: Secondary | ICD-10-CM | POA: Diagnosis not present

## 2019-12-08 DIAGNOSIS — I420 Dilated cardiomyopathy: Secondary | ICD-10-CM

## 2019-12-08 LAB — BASIC METABOLIC PANEL
Anion gap: 9 (ref 5–15)
BUN: 19 mg/dL (ref 6–20)
CO2: 27 mmol/L (ref 22–32)
Calcium: 8.8 mg/dL — ABNORMAL LOW (ref 8.9–10.3)
Chloride: 101 mmol/L (ref 98–111)
Creatinine, Ser: 1.49 mg/dL — ABNORMAL HIGH (ref 0.61–1.24)
GFR calc Af Amer: 58 mL/min — ABNORMAL LOW (ref >60)
GFR calc non Af Amer: 50 mL/min — ABNORMAL LOW (ref >60)
Glucose, Bld: 102 mg/dL — ABNORMAL HIGH (ref 70–99)
Potassium: 4.3 mmol/L (ref 3.5–5.1)
Sodium: 137 mmol/L (ref 135–145)

## 2019-12-08 LAB — CBC
HCT: 40.9 % (ref 39.0–52.0)
Hemoglobin: 14.4 g/dL (ref 13.0–17.0)
MCH: 29.5 pg (ref 26.0–34.0)
MCHC: 35.2 g/dL (ref 30.0–36.0)
MCV: 83.8 fL (ref 80.0–100.0)
Platelets: 283 10*3/uL (ref 150–400)
RBC: 4.88 MIL/uL (ref 4.22–5.81)
RDW: 15.1 % (ref 11.5–15.5)
WBC: 11 10*3/uL — ABNORMAL HIGH (ref 4.0–10.5)
nRBC: 0 % (ref 0.0–0.2)

## 2019-12-08 LAB — HEPARIN LEVEL (UNFRACTIONATED): Heparin Unfractionated: <0.1 IU/mL — ABNORMAL LOW (ref 0.30–0.70)

## 2019-12-08 MED ORDER — FUROSEMIDE 40 MG PO TABS: 40.0000 mg | ORAL_TABLET | Freq: Every day | ORAL | Status: DC

## 2019-12-08 MED ORDER — ISOSORBIDE MONONITRATE ER 30 MG PO TB24: 15.0000 mg | ORAL_TABLET | Freq: Every day | ORAL | 0 refills | Status: DC

## 2019-12-08 MED ORDER — CARVEDILOL 25 MG PO TABS
25.0000 mg | ORAL_TABLET | Freq: Two times a day (BID) | ORAL | Status: DC
  Administered 2019-12-08: 09:00:00 25 mg via ORAL
  Filled 2019-12-08: qty 1

## 2019-12-08 MED ORDER — APIXABAN 5 MG PO TABS: 5.0000 mg | ORAL_TABLET | Freq: Two times a day (BID) | ORAL | 0 refills | Status: DC

## 2019-12-08 MED ORDER — ATORVASTATIN CALCIUM 80 MG PO TABS: 80.0000 mg | ORAL_TABLET | Freq: Every day | ORAL | 0 refills | Status: DC

## 2019-12-08 MED ORDER — APIXABAN 5 MG PO TABS
5.0000 mg | ORAL_TABLET | Freq: Two times a day (BID) | ORAL | Status: DC
  Administered 2019-12-08: 09:00:00 5 mg via ORAL
  Filled 2019-12-08: qty 1

## 2019-12-08 MED ORDER — FUROSEMIDE 40 MG PO TABS: 40.0000 mg | ORAL_TABLET | Freq: Every day | ORAL | 0 refills | Status: DC

## 2019-12-08 MED ORDER — LOSARTAN POTASSIUM 25 MG PO TABS: 25.0000 mg | ORAL_TABLET | Freq: Every day | ORAL | 0 refills | Status: DC

## 2019-12-08 MED ORDER — CARVEDILOL 25 MG PO TABS: 25.0000 mg | ORAL_TABLET | Freq: Two times a day (BID) | ORAL | 0 refills | Status: DC

## 2019-12-08 MED ORDER — HYDRALAZINE HCL 50 MG PO TABS: 50.0000 mg | ORAL_TABLET | Freq: Three times a day (TID) | ORAL | 0 refills | Status: DC

## 2019-12-08 NOTE — Progress Notes (Signed)
IV and tele removed from patient. Discharge instructions given to patient. Verbalized understanding. No acute distress at this time. Patient to notify RN when ride arrives for transportation.

## 2019-12-08 NOTE — TOC Progression Note (Signed)
Transition of Care [facility]) - Progression Note    Patient Details  Name: Gregory Howell MRN: [id number] Date of Birth: 1960/09/23  Transition of Care [facility]) CM/SW Sebastian, RN Phone Number: 12/08/2019, 11:55 AM  Clinical Narrative:     Met with patient today, provided Eliquis card and answered questions about pharmacy prescriptions and need to follow up with his [place] doctor for care.  Expected Discharge Plan: Home/Self Care    Expected Discharge Plan and Services Expected Discharge Plan: Home/Self Care       Living arrangements for the past 2 months: Single Family Home Expected Discharge Date: 12/08/19                                     Social Determinants of Health (SDOH) Interventions    Readmission Risk Interventions No flowsheet data found.

## 2019-12-08 NOTE — Discharge Summary (Addendum)
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 Requests   Final    BOTTLES DRAWN AEROBIC AND ANAEROBIC Blood Culture results may not be optimal due to an excessive volume of blood received in culture bottles   Culture   Final    NO GROWTH 4 DAYS Performed at [facility], [address], [place], [place] [address]    Report Status PENDING  Incomplete  Urine culture     Status: None   Collection Time: 12/04/19  4:27 AM   Specimen: In/Out Cath Urine  Result Value Ref Range Status   Specimen Description   Final    IN/OUT CATH URINE Performed at [facility], [address] [place], [place] [address]    Special Requests   Final    NONE Performed at [facility], [address] [place], [place] [address]    Culture   Final    NO GROWTH Performed at [facility], [address], [place], [place]  [address]    Report Status 12/05/2019 FINAL  Final     Labs: Basic Metabolic Panel: Recent Labs  Lab 12/04/19 1707 12/05/19 0541 12/06/19 0712 12/07/19 0600 12/08/19 0452  NA 136 137 136 137 137  K 4.5 4.3 4.1 4.4 4.3  CL 103 103 102 105 101  CO2 27  GLUCOSE 104* 103* 103* 97 102*  BUN 16 17 20 20 19   CREATININE 1.36* 1.36* 1.57* 1.44* 1.49*  CALCIUM 8.5* 8.4* 8.4* 8.7* 8.8*  MG  --  2.0 2.3  --   --    Liver Function Tests: Recent Labs  Lab 12/03/19 2152 12/05/19 0541  AST 27  --   ALT 43  --   ALKPHOS 122  --   BILITOT 1.5* 1.7*  PROT 7.3  --   ALBUMIN 3.8  --    Recent Labs  Lab 12/03/19 2152  LIPASE 19   CBC: Recent Labs  Lab 12/03/19 2152 12/05/19 0541 12/06/19 0712 12/07/19 0600 12/08/19 0452  WBC 15.9* 13.2* 12.8* 12.9* 11.0*  HGB 14.7 14.1 15.2 15.0 14.4  HCT 39.7 37.8* 42.0 40.1 40.9  MCV 80.2 80.8 82.7 80.8 83.8  PLT 262 225 263 266 283    Principal Problem:   Acute HFrEF (heart failure with reduced ejection fraction) (HCC) Active Problems:   Hypertensive urgency   Tobacco abuse   Leukocytosis   Atrial fibrillation with RVR (HCC)   Nonobstructive atherosclerosis of coronary artery   AKI (acute kidney injury) (HCC)   Time coordinating discharge: 35 minutes  Signed:  12/10/19, MD  [facility]  12/08/2019, 6:49 PM

## 2019-12-08 NOTE — Progress Notes (Signed)
Progress Note  Patient Name: Gregory Howell Date of Encounter: 12/08/2019  Primary Cardiologist: New to [facility] - consult by Kirke Corin  Subjective   Feels well this morning. Dyspnea much improved. He has ambulated in the hallway without difficulty. No angina. Documented UOP of 3.1 L for the past 24 hours with a net - 5 L for the admission with minimal intake noted. Weight 94.1-->94.3 kg for the past 24 hours. Renal function stable. Leukocytosis improving.   Inpatient Medications    Scheduled Meds: . aspirin EC  81 mg Oral Daily  . atorvastatin  80 mg Oral Daily  . carvedilol  12.5 mg Oral BID WC  . furosemide  20 mg Intravenous Q12H  . guaiFENesin  600 mg Oral BID  . hydrALAZINE  50 mg Oral Q8H  . isosorbide mononitrate  15 mg Oral Daily  . losartan  25 mg Oral Daily  . sodium chloride flush  3 mL Intravenous Q12H  . sodium chloride flush  3 mL Intravenous Q12H  . sodium chloride flush  3 mL Intravenous Q12H   Continuous Infusions: . sodium chloride    . sodium chloride 10 mL/hr at 12/07/19 0911  . sodium chloride    . heparin Stopped (12/07/19 0911)   PRN Meds: sodium chloride, sodium chloride, sodium chloride, acetaminophen, acetaminophen, chlorpheniramine-HYDROcodone, hydrALAZINE, labetalol, ondansetron (ZOFRAN) IV, ondansetron (ZOFRAN) IV, oxyCODONE, sodium chloride flush, sodium chloride flush, technetium TC 43M mebrofenin, traMADol, traZODone   Vital Signs    Vitals:   12/07/19 1207 12/07/19 1606 12/07/19 1932 12/08/19 0501  BP: 137/85 (!) 158/93 (!) 142/96 (!) 140/96  Pulse: 78 79 81 67  Resp: 14 18 16 16   Temp:  97.6 F (36.4 C) 98.4 F (36.9 C) (!) 97.3 F (36.3 C)  TempSrc:  Oral Oral Oral  SpO2: 96% 97% 98% 97%  Weight:    94.3 kg  Height:        Intake/Output Summary (Last 24 hours) at 12/08/2019 12/10/2019 Last data filed at 12/08/2019 12/10/2019 Gross per 24 hour  Intake 240 ml  Output 3425 ml  Net -3185 ml   Filed Weights   12/06/19 0431 12/07/19 0326  12/08/19 0501  Weight: 94.2 kg 94.1 kg 94.3 kg    Telemetry    Afib with ventricular rates in the 70s to 80s bpm - Personally Reviewed  ECG    No new tracings - Personally Reviewed  Physical Exam   GEN: No acute distress.   Neck: No JVD. Cardiac: Irregularly irregular, no murmurs, rubs, or gallops.  Respiratory: Clear to auscultation bilaterally.  GI: Soft, nontender, non-distended.   MS: No edema; No deformity. Neuro:  Alert and oriented x 3; Nonfocal.  Psych: Normal affect.  Labs    Chemistry Recent Labs  Lab 12/03/19 2152 12/04/19 1707 12/05/19 0541 12/05/19 0541 12/06/19 0712 12/07/19 0600 12/08/19 0452  NA 136   < > 137   < > 136 137 137  K 4.3   < > 4.3   < > 4.1 4.4 4.3  CL 104   < > 103   < > 102 105 101  CO2 25   < > 27   < > 26 25 27   GLUCOSE 117*   < > 103*   < > 103* 97 102*  BUN 12   < > 17   < > 20 20 19   CREATININE 1.31*   < > 1.36*   < > 1.57* 1.44* 1.49*  CALCIUM 8.4*   < >  8.4*   < > 8.4* 8.7* 8.8*  PROT 7.3  --   --   --   --   --   --   ALBUMIN 3.8  --   --   --   --   --   --   AST 27  --   --   --   --   --   --   ALT 43  --   --   --   --   --   --   ALKPHOS 122  --   --   --   --   --   --   BILITOT 1.5*  --  1.7*  --   --   --   --   GFRNONAA 59*   < > 56*   < > 47* 52* 50*  GFRAA >60   < > >60   < > 55* >60 58*  ANIONGAP 7   < > 7   < > 8 7 9    < > = values in this interval not displayed.     Hematology Recent Labs  Lab 12/06/19 0712 12/07/19 0600 12/08/19 0452  WBC 12.8* 12.9* 11.0*  RBC 5.08 4.96 4.88  HGB 15.2 15.0 14.4  HCT 42.0 40.1 40.9  MCV 82.7 80.8 83.8  MCH 29.9 30.2 29.5  MCHC 36.2* 37.4* 35.2  RDW 15.3 15.0 15.1  PLT 263 266 283    Cardiac EnzymesNo results for input(s): TROPONINI in the last 168 hours. No results for input(s): TROPIPOC in the last 168 hours.   BNP Recent Labs  Lab 12/03/19 2152  BNP 660.0*     DDimer No results for input(s): DDIMER in the last 168 hours.   Radiology     CXR  12/03/2019: IMPRESSION: 1. Cardiomegaly with mild diffuse pulmonary interstitial congestion and trace bilateral pleural effusions. 2. Suspected underlying COPD. __________  CTA chest 12/04/2019: IMPRESSION: 1. No central, segmental, or subsegmental pulmonary embolism. 2. Small bilateral pleural effusions, right greater than left. 3. Mild cardiomegaly. 4. Trace pericardial effusion 5. Small amount of pericholecystic fluid with surrounding fat stranding changes which could be due to acalculous cholecystitis. 6. Mild amount of inflammatory stranding changes tracking into the right pericolic gutter. 7.  Aortic Atherosclerosis (ICD10-I70.0). __________  CT Abdomen/Pelvis 12/04/2019: IMPRESSION: 1. No central, segmental, or subsegmental pulmonary embolism. 2. Small bilateral pleural effusions, right greater than left. 3. Mild cardiomegaly. 4. Trace pericardial effusion 5. Small amount of pericholecystic fluid with surrounding fat stranding changes which could be due to acalculous cholecystitis. 6. Mild amount of inflammatory stranding changes tracking into the right pericolic gutter. 7.  Aortic Atherosclerosis (ICD10-I70.0). __________  RUQ Ultrasound 12/04/2019: IMPRESSION: 1. Positive sonographic Murphy sign without cholelithiasis or other features for acute cholecystitis. 2. No biliary dilatation. ___________    Cardiac Studies   2D echo 12/04/2019: 1. Left ventricular ejection fraction, by estimation, is 25 to 30%. The  left ventricle has severely decreased function. The left ventricle  demonstrates global hypokinesis. There is moderate left ventricular  hypertrophy. Left ventricular diastolic  parameters are indeterminate.  2. Right ventricular systolic function is severely reduced. The right  ventricular size is normal. There is moderately elevated pulmonary artery  systolic pressure. The estimated right ventricular systolic pressure is  54.6 mmHg.  3. Left atrial  size was mildly dilated.  4. Right atrial size was mildly dilated.  5. The mitral valve is normal in structure. Mild mitral valve  regurgitation.  6. The aortic valve was not well visualized. Aortic valve regurgitation  is not visualized. No aortic stenosis is present.  7. The inferior vena cava is dilated in size with <50% respiratory  variability, suggesting right atrial pressure of 15 mmHg. __________  [facility] 12/07/2019: Coronary dominance: Right  Left mainstem:   Large vessel that bifurcates into the LAD and left circumflex, no significant disease noted  Left anterior descending (LAD): Large vessel that extends to the apical region, diagonal branch 2 of moderate size, no significant disease noted, mild diffuse disease LAD, diagonal vessels  Left circumflex (LCx):  Large vessel with OM branch 3, OM1 with mild 40% proximal disease, OM 2 is essentially occluded in the proximal region with collaterals/small, atretic, OM 3 has 60% mid vessel disease  Right coronary artery (RCA):  Right dominant vessel with PL and PDA, 50% ostial disease, 30% proximal to mid vessel disease, 60% PL branch disease  Left ventriculography: Left ventricular systolic function is severely depressed, mildly dilated LV, LVEF is estimated at 25%, no significant aortic valve stenosis Mild to moderate MR  Right heart pressures RA 10 mmHg RV 45/5, 8 PA 47/27, mean 36 Wedge pressure mean 20 LV 137/13 Ao 129/91  Cardiac output 3.31 cardiac index 1.58   Final Conclusions:   Severely depressed LV function estimated 25%, Nonobstructive coronary disease Coronary disease out of proportion to depressed ejection fraction -Mildly elevated right heart pressures and wedge   Recommendations:  Continue carvedilol, hydralazine, isosorbide Aspirin, statin Smoking cessation recommended Alcohol cessation recommended (recently moved from "beer to brown liquor") --Start low-dose losartan tomorrow Transition to  [employer] as an outpatient -Blood pressure continues to run high, will increase isosorbide up to 30 daily  Patient Profile     59 y.o. male with history of newly diagnosed HFrEF secondary to NICM, new onset Afib, HTN, medication nonadherence, and tobacco use who was admitted with acute HFrEF secondary to NICM along with new onset Afib.   Assessment & Plan    1. Acute HFrEF secondary to NICM: -Appears euvolemic and well compensated -Titrate Coreg to 25 mg bid -Plan to transition to [facility] as an outpatient -Losartan for now -Imdur/hydralazine  -Transition to po Lasix -Has not been initiated on spironolactone secondary to CKD, consider this as an outpatient -CHF education -He will need a follow up limited echo after GDMT has been optimized and sinus rhythm has been restored, if his EF remains less than 35% at that time he will need referral to EP  2. Nonobstructive CAD: -LHC 4/29 without obstructive CAD as above -ASA -Coreg -Imdur -Lipitor -Aggressive risk factor modification -Post cath instructions   3. New onset Afib: -Uncertain chronicity -Remains in Afib with controlled ventricular response -Titrate Coreg to 25 mg bid -Resume Eliquis 5 mg bid (was held for [facility] on 4/29) -CHADS2VASc at least 3 (CHF, HTN, vascular disease) -Ambulate to assess for adequate rate and symptom control -If he remains in Afib in follow up after he has been adequately anticoagulate without interruption for the next 3-4 weeks, plan for DCCV at that time -May need AAT  4. HTN: -BP in the 140s systolic this morning -Medications as above  5. Tobacco use: -Complete cessation is recommended    For questions or updates, please contact [facility] Please consult [email or web address] for contact info under Cardiology/STEMI.    Signed, Eula Listen, PA-C [facility] Pager: [phone] 12/08/2019, 7:12 AM

## 2019-12-09 LAB — CULTURE, BLOOD (ROUTINE X 2)
Culture: NO GROWTH
Culture: NO GROWTH

## 2019-12-14 NOTE — Progress Notes (Deleted)
   Patient ID: Gregory Howell, male    DOB: 09/07/1960, 59 y.o.   MRN: [id number]  HPI  Gregory Howell is a 59 y/o male with a history of  Echo report from 12/04/19 reviewed and showed an EF of 25-30% along with moderate LVH, moderately elevated PA Pressure with mild Gregory.   Catheterization done 11/17/19 showed:  2nd Mrg lesion is 100% stenosed.  Dist Cx lesion is 60% stenosed.  1st Mrg lesion is 40% stenosed.  Ost RCA lesion is 50% stenosed.  RPAV lesion is 60% stenosed.  Mid RCA lesion is 30% stenosed.  LV end diastolic pressure is mildly elevated.  There is severe left ventricular systolic dysfunction.  The left ventricular ejection fraction is less than 25% by visual estimate.  Hemodynamic findings consistent with mild pulmonary hypertension.  Admitted 12/04/19 due to acute heart failure. Cardiology consult obtained. Initially given IV lasix and then transitioned to oral diuretics. Discharged after 4 days.   He presents today for his initial visit with a chief complaint of  Review of Systems    Physical Exam    Assessment & Plan:  1: Chronic heart failure with reduced ejection fraction- - NYHA class - BNP 12/03/19 was 660.0  2: HTN- - BP - BMP 12/08/19 reviewed and showed sodium 137, potassium 4.3, creatinine 1.49 and GFR 58

## 2019-12-15 ENCOUNTER — Telehealth: Payer: Self-pay | Admitting: Family

## 2019-12-15 ENCOUNTER — Ambulatory Visit: Payer: No Typology Code available for payment source | Admitting: Family

## 2019-12-15 NOTE — Telephone Encounter (Signed)
Patient did not show for his [facility] appointment on 12/15/19. Will attempt to reschedule.

## 2020-04-04 ENCOUNTER — Encounter: Payer: Self-pay | Admitting: *Deleted

## 2020-04-04 ENCOUNTER — Emergency Department: Payer: No Typology Code available for payment source

## 2020-04-04 ENCOUNTER — Other Ambulatory Visit: Payer: Self-pay

## 2020-04-04 ENCOUNTER — Inpatient Hospital Stay
Admission: EM | Admit: 2020-04-04 | Discharge: 2020-04-07 | DRG: 292 | Disposition: A | Discharge status: Home / Self Care | Payer: No Typology Code available for payment source | Attending: Internal Medicine | Admitting: Internal Medicine

## 2020-04-04 DIAGNOSIS — R1011 Right upper quadrant pain: Secondary | ICD-10-CM

## 2020-04-04 DIAGNOSIS — Z7289 Other problems related to lifestyle: Secondary | ICD-10-CM

## 2020-04-04 DIAGNOSIS — Z7141 Alcohol abuse counseling and surveillance of alcoholic: Secondary | ICD-10-CM

## 2020-04-04 DIAGNOSIS — I428 Other cardiomyopathies: Secondary | ICD-10-CM | POA: Diagnosis present

## 2020-04-04 DIAGNOSIS — I5043 Acute on chronic combined systolic (congestive) and diastolic (congestive) heart failure: Secondary | ICD-10-CM | POA: Diagnosis present

## 2020-04-04 DIAGNOSIS — Z8249 Family history of ischemic heart disease and other diseases of the circulatory system: Secondary | ICD-10-CM

## 2020-04-04 DIAGNOSIS — I700 Atherosclerosis of aorta: Secondary | ICD-10-CM | POA: Diagnosis present

## 2020-04-04 DIAGNOSIS — N179 Acute kidney failure, unspecified: Secondary | ICD-10-CM

## 2020-04-04 DIAGNOSIS — I2510 Atherosclerotic heart disease of native coronary artery without angina pectoris: Secondary | ICD-10-CM | POA: Diagnosis present

## 2020-04-04 DIAGNOSIS — R7989 Other specified abnormal findings of blood chemistry: Secondary | ICD-10-CM | POA: Diagnosis present

## 2020-04-04 DIAGNOSIS — I110 Hypertensive heart disease with heart failure: Secondary | ICD-10-CM | POA: Diagnosis not present

## 2020-04-04 DIAGNOSIS — I4891 Unspecified atrial fibrillation: Secondary | ICD-10-CM

## 2020-04-04 DIAGNOSIS — Z20822 Contact with and (suspected) exposure to covid-19: Secondary | ICD-10-CM | POA: Diagnosis present

## 2020-04-04 DIAGNOSIS — Z716 Tobacco abuse counseling: Secondary | ICD-10-CM

## 2020-04-04 DIAGNOSIS — Z79899 Other long term (current) drug therapy: Secondary | ICD-10-CM

## 2020-04-04 DIAGNOSIS — I272 Pulmonary hypertension, unspecified: Secondary | ICD-10-CM | POA: Diagnosis present

## 2020-04-04 DIAGNOSIS — I4819 Other persistent atrial fibrillation: Secondary | ICD-10-CM | POA: Diagnosis present

## 2020-04-04 DIAGNOSIS — I509 Heart failure, unspecified: Secondary | ICD-10-CM

## 2020-04-04 DIAGNOSIS — R252 Cramp and spasm: Secondary | ICD-10-CM | POA: Diagnosis not present

## 2020-04-04 DIAGNOSIS — Z7901 Long term (current) use of anticoagulants: Secondary | ICD-10-CM

## 2020-04-04 DIAGNOSIS — Z8673 Personal history of transient ischemic attack (TIA), and cerebral infarction without residual deficits: Secondary | ICD-10-CM

## 2020-04-04 DIAGNOSIS — R0789 Other chest pain: Secondary | ICD-10-CM | POA: Diagnosis present

## 2020-04-04 DIAGNOSIS — F17210 Nicotine dependence, cigarettes, uncomplicated: Secondary | ICD-10-CM | POA: Diagnosis present

## 2020-04-04 DIAGNOSIS — Z7982 Long term (current) use of aspirin: Secondary | ICD-10-CM

## 2020-04-04 LAB — CBC WITH DIFFERENTIAL/PLATELET
Abs Immature Granulocytes: 0.03 10*3/uL (ref 0.00–0.07)
Basophils Absolute: 0.1 10*3/uL (ref 0.0–0.1)
Basophils Relative: 1 %
Eosinophils Absolute: 0.2 10*3/uL (ref 0.0–0.5)
Eosinophils Relative: 2 %
HCT: 39 % (ref 39.0–52.0)
Hemoglobin: 14.2 g/dL (ref 13.0–17.0)
Immature Granulocytes: 0 %
Lymphocytes Relative: 16 %
Lymphs Abs: 1.9 10*3/uL (ref 0.7–4.0)
MCH: 30.1 pg (ref 26.0–34.0)
MCHC: 36.4 g/dL — ABNORMAL HIGH (ref 30.0–36.0)
MCV: 82.8 fL (ref 80.0–100.0)
Monocytes Absolute: 0.9 10*3/uL (ref 0.1–1.0)
Monocytes Relative: 8 %
Neutro Abs: 8.6 10*3/uL — ABNORMAL HIGH (ref 1.7–7.7)
Neutrophils Relative %: 73 %
Platelets: 231 10*3/uL (ref 150–400)
RBC: 4.71 MIL/uL (ref 4.22–5.81)
RDW: 17.4 % — ABNORMAL HIGH (ref 11.5–15.5)
WBC: 11.8 10*3/uL — ABNORMAL HIGH (ref 4.0–10.5)
nRBC: 0 % (ref 0.0–0.2)

## 2020-04-04 LAB — HEPATIC FUNCTION PANEL
ALT: 25 U/L (ref 0–44)
AST: 31 U/L (ref 15–41)
Albumin: 3.8 g/dL (ref 3.5–5.0)
Alkaline Phosphatase: 104 U/L (ref 38–126)
Bilirubin, Direct: 0.6 mg/dL — ABNORMAL HIGH (ref 0.0–0.2)
Indirect Bilirubin: 2.2 mg/dL — ABNORMAL HIGH (ref 0.3–0.9)
Total Bilirubin: 2.8 mg/dL — ABNORMAL HIGH (ref 0.3–1.2)
Total Protein: 7.3 g/dL (ref 6.5–8.1)

## 2020-04-04 LAB — RESP PANEL BY RT PCR (RSV, FLU A&B, COVID)
Influenza A by PCR: NEGATIVE
Influenza B by PCR: NEGATIVE
Respiratory Syncytial Virus by PCR: NEGATIVE
SARS Coronavirus 2 by RT PCR: NEGATIVE

## 2020-04-04 LAB — PROTIME-INR
INR: 1.4 — ABNORMAL HIGH (ref 0.8–1.2)
Prothrombin Time: 16.3 seconds — ABNORMAL HIGH (ref 11.4–15.2)

## 2020-04-04 LAB — CBC
HCT: 40.2 % (ref 39.0–52.0)
Hemoglobin: 14.5 g/dL (ref 13.0–17.0)
MCH: 30.6 pg (ref 26.0–34.0)
MCHC: 36.1 g/dL — ABNORMAL HIGH (ref 30.0–36.0)
MCV: 84.8 fL (ref 80.0–100.0)
Platelets: 229 10*3/uL (ref 150–400)
RBC: 4.74 MIL/uL (ref 4.22–5.81)
RDW: 17.4 % — ABNORMAL HIGH (ref 11.5–15.5)
WBC: 11.5 10*3/uL — ABNORMAL HIGH (ref 4.0–10.5)
nRBC: 0.2 % (ref 0.0–0.2)

## 2020-04-04 LAB — BASIC METABOLIC PANEL
Anion gap: 9 (ref 5–15)
BUN: 13 mg/dL (ref 6–20)
CO2: 26 mmol/L (ref 22–32)
Calcium: 8.4 mg/dL — ABNORMAL LOW (ref 8.9–10.3)
Chloride: 102 mmol/L (ref 98–111)
Creatinine, Ser: 1.6 mg/dL — ABNORMAL HIGH (ref 0.61–1.24)
GFR calc Af Amer: 53 mL/min — ABNORMAL LOW (ref >60)
GFR calc non Af Amer: 46 mL/min — ABNORMAL LOW (ref >60)
Glucose, Bld: 110 mg/dL — ABNORMAL HIGH (ref 70–99)
Potassium: 4.2 mmol/L (ref 3.5–5.1)
Sodium: 137 mmol/L (ref 135–145)

## 2020-04-04 LAB — FIBRIN DERIVATIVES D-DIMER (ARMC ONLY): Fibrin derivatives D-dimer (ARMC): 3569.39 ng/mL (FEU) — ABNORMAL HIGH (ref 0.00–499.00)

## 2020-04-04 LAB — BRAIN NATRIURETIC PEPTIDE: B Natriuretic Peptide: 694.3 pg/mL — ABNORMAL HIGH (ref 0.0–100.0)

## 2020-04-04 LAB — TROPONIN I (HIGH SENSITIVITY)
Troponin I (High Sensitivity): 44 ng/L — ABNORMAL HIGH (ref <18)
Troponin I (High Sensitivity): 44 ng/L — ABNORMAL HIGH (ref <18)

## 2020-04-04 MED ORDER — FUROSEMIDE 10 MG/ML IJ SOLN
60.0000 mg | Freq: Once | INTRAMUSCULAR | Status: AC
  Administered 2020-04-05: 60 mg via INTRAVENOUS
  Filled 2020-04-04: qty 8

## 2020-04-04 MED ORDER — DILTIAZEM HCL 25 MG/5ML IV SOLN
INTRAVENOUS | Status: AC
  Filled 2020-04-04: qty 5

## 2020-04-04 MED ORDER — IOHEXOL 350 MG/ML SOLN
100.0000 mL | Freq: Once | INTRAVENOUS | Status: AC | PRN
  Administered 2020-04-04: 100 mL via INTRAVENOUS

## 2020-04-04 NOTE — ED Notes (Signed)
Pt returned from CT att.

## 2020-04-04 NOTE — ED Notes (Signed)
This RN called lab to see if respiratory swab already collected could be used for SARS swab order that was placed.

## 2020-04-04 NOTE — ED Provider Notes (Addendum)
-----------------------------------------   11:24 PM on 04/04/2020 -----------------------------------------  Assuming care from Dr. Darnelle Catalan via Dr. Lenard Lance.  In short, Gregory Howell is a 59 y.o. male with a chief complaint of chest pain and acute dyspnea.  Refer to the original H&P for additional details.  The current plan of care is to await callback from [place] regarding possible transfer.  If transfer not possible, admit patient to [facility]   ----------------------------------------- 12:11 AM on 04/05/2020 -----------------------------------------  [facility] so [facility] will not be possible tonight and for any time in the near future.  The patient needs inpatient care and continue to delay disposition for a possible [facility] point in the future could cause significant morbidity and even mortality and is not in the patient's best interest.  As result I am admitting the patient to [facility] since a [place] bed is unavailable.  Hospitalist service has been consulted.    ----------------------------------------- 12:17 AM on 04/05/2020 -----------------------------------------  Discussed case by phone with Dr. Arville Care who will admit.     ----------------------------------------- 1:20 AM on 04/05/2020 -----------------------------------------  Dr. Verlin Fester in the [facility] called back to clarify the situation.  She explained that there are no telemetry beds and therefore they must refuse transfer of the patient.  Admitting him here at this facility is the right thing to do and he should be eligible for full benefits because [facility] is unable [facility] at this time.   Loleta Rose, MD 04/05/20 [phone]

## 2020-04-04 NOTE — ED Provider Notes (Signed)
[facility] Emergency Department Provider Note   ____________________________________________   First MD Initiated Contact with Patient 04/04/20 1712     (approximate)  I have reviewed the triage vital signs and the nursing notes.   HISTORY  Chief Complaint Chest Pain and Shortness of Breath    HPI Gregory Howell is a 59 y.o. male who reports several weeks of worsening shortness of breath and sometimes chest pain.  He had it last night as well.  He reports he has not been able to lay down flat.  He can only lay down about 10 minutes before he has to sit up chest pain was tightness.  He is not having any chest tightness now that he is short of breath.  Shortness of breath gets worse when he walks.  He can only walk about 30 feet before he gets short of breath and has to stop his doctors at the [place].  His doctor says he is making plans to cardiovert him to get his heart back into rhythm.  He did have chest pain or tightness last night.        Past Medical History:  Diagnosis Date  . HTN (hypertension)     Patient Active Problem List   Diagnosis Date Noted  . Nonobstructive atherosclerosis of coronary artery 12/07/2019  . AKI (acute kidney injury) (HCC) 12/07/2019  . Acute HFrEF (heart failure with reduced ejection fraction) (HCC)   . Atrial fibrillation with RVR (HCC) 12/04/2019  . Substance induced mood disorder (HCC) 02/22/2017  . Alcohol intoxication (HCC) 02/22/2017  . Chest pain with moderate risk for cardiac etiology 11/02/2016  . Hypertensive urgency 11/02/2016  . Tobacco abuse 11/02/2016  . Leukocytosis 11/02/2016    Past Surgical History:  Procedure Laterality Date  . RIGHT/LEFT HEART CATH AND CORONARY ANGIOGRAPHY N/A 12/07/2019   Procedure: RIGHT/LEFT HEART CATH AND CORONARY ANGIOGRAPHY;  Surgeon: Antonieta Iba, MD;  Location: [facility] INVASIVE CV LAB;  Service: Cardiovascular;  Laterality: N/A;    Prior to Admission medications    Medication Sig Start Date End Date Taking? Authorizing Provider  apixaban (ELIQUIS) 5 MG TABS tablet Take 1 tablet (5 mg total) by mouth 2 (two) times daily. 12/08/19   Standley Brooking, MD  aspirin EC 81 MG EC tablet Take 1 tablet (81 mg total) by mouth daily. Patient not taking: Reported on 12/04/2019 11/03/16   Milagros Loll, MD  atorvastatin (LIPITOR) 80 MG tablet Take 1 tablet (80 mg total) by mouth daily. 12/08/19   Standley Brooking, MD  carvedilol (COREG) 25 MG tablet Take 1 tablet (25 mg total) by mouth 2 (two) times daily with a meal. 12/08/19   Standley Brooking, MD  furosemide (LASIX) 40 MG tablet Take 1 tablet (40 mg total) by mouth daily. 12/09/19   Standley Brooking, MD  hydrALAZINE (APRESOLINE) 50 MG tablet Take 1 tablet (50 mg total) by mouth every 8 (eight) hours. 12/08/19   Standley Brooking, MD  isosorbide mononitrate (IMDUR) 30 MG 24 hr tablet Take 0.5 tablets (15 mg total) by mouth daily. 12/09/19   Standley Brooking, MD  losartan (COZAAR) 25 MG tablet Take 1 tablet (25 mg total) by mouth daily. 12/09/19   Standley Brooking, MD    Allergies Patient has no known allergies.  Family History  Problem Relation Age of Onset  . Hypertension Mother   . Hypertension Father   . CAD Brother   . Diabetes Brother  Social History Social History   Tobacco Use  . Smoking status: Current Every Day Smoker    Packs/day: 0.50    Types: Cigarettes  . Smokeless tobacco: Never Used  Substance Use Topics  . Alcohol use: Yes    Alcohol/week: 7.0 - 14.0 standard drinks    Types: 7 - 14 Cans of beer per week    Comment: daily  . Drug use: Never    Review of Systems  Constitutional: No fever/chills Eyes: No visual changes. ENT: No sore throat. Cardiovascular:  chest pain. Respiratory: shortness of breath. Gastrointestinal: No abdominal pain.  No nausea, no vomiting.  No diarrhea.  No constipation. Genitourinary: Negative for dysuria. Musculoskeletal: Negative for back  pain. Skin: Negative for rash. Neurological: Negative for headaches, focal weakness   ____________________________________________   PHYSICAL EXAM:  VITAL SIGNS: ED Triage Vitals  Enc Vitals Group     BP 04/04/20 1636 (!) 141/95     Pulse Rate 04/04/20 1636 93     Resp 04/04/20 1636 20     Temp 04/04/20 1636 (!) 97.5 F (36.4 C)     Temp Source 04/04/20 1636 Oral     SpO2 04/04/20 1636 96 %     Weight 04/04/20 1637 220 lb (99.8 kg)     Height 04/04/20 1637 5\' 9"  (1.753 m)     Head Circumference --      Peak Flow --      Pain Score 04/04/20 1637 0     Pain Loc --      Pain Edu? --      Excl. in GC? --    Constitutional: Alert and oriented.  Looks somewhat short of breath Eyes: Conjunctivae are normal. PERRL. EOMI. Head: Atraumatic. Nose: No congestion/rhinnorhea. Mouth/Throat: Mucous membranes are moist.  Oropharynx non-erythematous. Neck: No stridor.  Cardiovascular: Normal rate, regular rhythm. Grossly normal heart sounds.  Good peripheral circulation. Respiratory: Normal respiratory effort.  No retractions. Lungs CTAB. Gastrointestinal: Soft and nontender. No distention. No abdominal bruits. No CVA tenderness. Musculoskeletal: No lower extremity tenderness trace bilateral edema.  Neurologic:  Normal speech and language. No gross focal neurologic deficits are appreciated.  Skin:  Skin is warm, dry and intact. No rash noted.  ____________________________________________   LABS (all labs ordered are listed, but only abnormal results are displayed)  Labs Reviewed  BASIC METABOLIC PANEL - Abnormal; Notable for the following components:      Result Value   Glucose, Bld 110 (*)    Creatinine, Ser 1.60 (*)    Calcium 8.4 (*)    GFR calc non Af Amer 46 (*)    GFR calc Af Amer 53 (*)    All other components within normal limits  CBC - Abnormal; Notable for the following components:   WBC 11.5 (*)    MCHC 36.1 (*)    RDW 17.4 (*)    All other components within normal  limits  PROTIME-INR - Abnormal; Notable for the following components:   Prothrombin Time 16.3 (*)    INR 1.4 (*)    All other components within normal limits  BRAIN NATRIURETIC PEPTIDE - Abnormal; Notable for the following components:   B Natriuretic Peptide 694.3 (*)    All other components within normal limits  CBC WITH DIFFERENTIAL/PLATELET - Abnormal; Notable for the following components:   WBC 11.8 (*)    MCHC 36.4 (*)    RDW 17.4 (*)    Neutro Abs 8.6 (*)    All other components within  normal limits  HEPATIC FUNCTION PANEL - Abnormal; Notable for the following components:   Total Bilirubin 2.8 (*)    Bilirubin, Direct 0.6 (*)    Indirect Bilirubin 2.2 (*)    All other components within normal limits  FIBRIN DERIVATIVES D-DIMER ([facility] ONLY) - Abnormal; Notable for the following components:   Fibrin derivatives D-dimer [facility]) 6,295.28 (*)    All other components within normal limits  TROPONIN I (HIGH SENSITIVITY) - Abnormal; Notable for the following components:   Troponin I (High Sensitivity) 44 (*)    All other components within normal limits  TROPONIN I (HIGH SENSITIVITY) - Abnormal; Notable for the following components:   Troponin I (High Sensitivity) 44 (*)    All other components within normal limits  RESP PANEL BY RT PCR (RSV, FLU A&B, COVID)  CBC WITH DIFFERENTIAL/PLATELET   ____________________________________________  EKG EKG read interpreted by me shows A. fib or flutter at a rate of 100 normal axis no acute ST-T changes although there are flipped T's in V5 V6 similar to EKG from 12/03/2019.  ____________________________________________  RADIOLOGY  ED MD interpretation: Chest x-ray read by radiology reviewed by me shows stable interstitial prominence.  Likely CHF.  Official radiology report(s): DG Chest 2 View  Result Date: 04/04/2020 CLINICAL DATA:  Shortness of breath EXAM: CHEST - 2 VIEW COMPARISON:  12/03/2019 FINDINGS: Cardiomegaly. Peribronchial  thickening and interstitial prominence, similar to prior study. No effusions. No acute bony abnormality. IMPRESSION: Stable interstitial prominence throughout the lungs could reflect interstitial edema or chronic interstitial lung disease. Electronically Signed   By: Charlett Nose M.D.   On: 04/04/2020 17:06   CT Angio Chest PE W and/or Wo Contrast  Result Date: 04/04/2020 CLINICAL DATA:  59 year old male with shortness of breath and positive D-dimer. Concern for pulmonary embolism. EXAM: CT ANGIOGRAPHY CHEST WITH CONTRAST TECHNIQUE: Multidetector CT imaging of the chest was performed using the standard protocol during bolus administration of intravenous contrast. Multiplanar CT image reconstructions and MIPs were obtained to evaluate the vascular anatomy. CONTRAST:  OMNIPAQUE IOHEXOL 350 MG/ML SOLN COMPARISON:  Chest CT dated 12/04/2019 and radiograph dated 04/04/2020 FINDINGS: Cardiovascular: There is mild cardiomegaly. No pericardial effusion. There is retrograde flow of contrast from the right atrium into the IVC suggestive of a degree of right heart dysfunction. Three vessel coronary vascular calcification. There is mild atherosclerotic calcification of the thoracic aorta. No pulmonary artery embolus identified. Mediastinum/Nodes: Mildly enlarged hilar and mediastinal lymph nodes measures 15 mm in the right hilum, 12 mm in the subcarinal region, 12 mm in the right paratracheal region and 9 mm in short axis in the prevascular space. These are relatively similar to prior CT. The esophagus and the thyroid gland are grossly unremarkable. No mediastinal fluid collection. Lungs/Pleura: Trace right pleural effusion. There is diffuse interstitial prominence and hazy airspace density throughout the lungs concerning for edema. Pneumonia is less likely but not excluded. Clinical correlation is recommended. There is no focal consolidation or pneumothorax. The central airways are patent. Upper Abdomen: Trace  perihepatic free fluid. There is minimal irregularity of the liver contour which may represent early changes of cirrhosis. Clinical correlation is recommended. Musculoskeletal: No chest wall abnormality. No acute or significant osseous findings. Review of the MIP images confirms the above findings. IMPRESSION: 1. No CT evidence of pulmonary embolism. 2. Cardiomegaly with findings of CHF and a trace right pleural effusion. 3. Mildly enlarged hilar and mediastinal lymph nodes, similar to prior CT, likely reactive. 4. Trace perihepatic free  fluid. 5. Aortic Atherosclerosis (ICD10-I70.0). Electronically Signed   By: Elgie Collard M.D.   On: 04/04/2020 19:43   US Abdomen Limited RUQ  Result Date: 04/04/2020 CLINICAL DATA:  Right upper quadrant pain for 2 days EXAM: ULTRASOUND ABDOMEN LIMITED RIGHT UPPER QUADRANT COMPARISON:  12/04/2019 FINDINGS: Gallbladder: Gallbladder is predominately decompressed although the wall is significantly thickened 8 mm. Negative sonographic Murphy's sign is noted however. No stones are seen. Common bile duct: Diameter: 3 mm Liver: No focal lesion identified. Within normal limits in parenchymal echogenicity. Portal vein is patent on color Doppler imaging with normal direction of blood flow towards the liver. Other: None. IMPRESSION: Gallbladder wall thickening in part likely related to decompression. Negative sonographic Murphy's sign is noted. These changes could represent some chronic cholecystitis. No other focal abnormality is noted. Electronically Signed   By: Alcide Clever M.D.   On: 04/04/2020 19:00    ____________________________________________   PROCEDURES  Procedure(s) performed (including Critical Care): Critical care time 25 minutes.  This includes evaluating the patient and and reevaluating him at least twice.  Also I have reviewed his old records and studies.  Procedures   ____________________________________________   INITIAL IMPRESSION / ASSESSMENT AND  PLAN / ED COURSE ----------------------------------------- 8:52 PM on 04/04/2020 -----------------------------------------   By history and labs patient has CHF with an elevated BNP and paroxysmal nocturnal dyspnea/orthopnea.  He also has exertional dyspnea.  His troponin is elevated at 44.  He does not have a PE on apparent pneumonia looks more like CHF.  His Covid is negative.  He is already taking all of his medications.  He has some AKI as well.  In my medical opinion we will need to get him in the hospital to regulate his fluid balance and kidney function.    [facility]  [facility] if not we will keep him here.        ____________________________________________   FINAL CLINICAL IMPRESSION(S) / ED DIAGNOSES  Final diagnoses:  Acute on chronic congestive heart failure, unspecified heart failure type (HCC)  Atrial fibrillation, unspecified type (HCC)  AKI (acute kidney injury) [facility])     ED Discharge Orders    None       Note:  This document was prepared using Dragon voice recognition software and may include unintentional dictation errors.    Arnaldo Natal, MD 04/04/20 2052

## 2020-04-04 NOTE — ED Notes (Signed)
Pt given remote at this time

## 2020-04-04 NOTE — ED Triage Notes (Signed)
Pt ambulatory to triage.  Pt reports chest pain and sob since last night.  No n/v/d.  No cough or fever.  cig smoker.  Pt alert   Speech clear.

## 2020-04-05 ENCOUNTER — Inpatient Hospital Stay (HOSPITAL_COMMUNITY)
Admit: 2020-04-05 | Discharge: 2020-04-05 | Disposition: A | Discharge status: Home / Self Care | Payer: No Typology Code available for payment source | Attending: Family Medicine | Admitting: Family Medicine

## 2020-04-05 DIAGNOSIS — Z7982 Long term (current) use of aspirin: Secondary | ICD-10-CM | POA: Diagnosis not present

## 2020-04-05 DIAGNOSIS — I700 Atherosclerosis of aorta: Secondary | ICD-10-CM | POA: Diagnosis present

## 2020-04-05 DIAGNOSIS — I2510 Atherosclerotic heart disease of native coronary artery without angina pectoris: Secondary | ICD-10-CM | POA: Diagnosis not present

## 2020-04-05 DIAGNOSIS — I272 Pulmonary hypertension, unspecified: Secondary | ICD-10-CM | POA: Diagnosis present

## 2020-04-05 DIAGNOSIS — Z7289 Other problems related to lifestyle: Secondary | ICD-10-CM | POA: Diagnosis not present

## 2020-04-05 DIAGNOSIS — Z7141 Alcohol abuse counseling and surveillance of alcoholic: Secondary | ICD-10-CM | POA: Diagnosis not present

## 2020-04-05 DIAGNOSIS — R0789 Other chest pain: Secondary | ICD-10-CM | POA: Diagnosis present

## 2020-04-05 DIAGNOSIS — I4819 Other persistent atrial fibrillation: Secondary | ICD-10-CM | POA: Diagnosis not present

## 2020-04-05 DIAGNOSIS — I4891 Unspecified atrial fibrillation: Secondary | ICD-10-CM

## 2020-04-05 DIAGNOSIS — I110 Hypertensive heart disease with heart failure: Principal | ICD-10-CM

## 2020-04-05 DIAGNOSIS — Z8249 Family history of ischemic heart disease and other diseases of the circulatory system: Secondary | ICD-10-CM | POA: Diagnosis not present

## 2020-04-05 DIAGNOSIS — I5023 Acute on chronic systolic (congestive) heart failure: Secondary | ICD-10-CM | POA: Diagnosis not present

## 2020-04-05 DIAGNOSIS — F17210 Nicotine dependence, cigarettes, uncomplicated: Secondary | ICD-10-CM | POA: Diagnosis present

## 2020-04-05 DIAGNOSIS — N179 Acute kidney failure, unspecified: Secondary | ICD-10-CM

## 2020-04-05 DIAGNOSIS — I5021 Acute systolic (congestive) heart failure: Secondary | ICD-10-CM

## 2020-04-05 DIAGNOSIS — Z20822 Contact with and (suspected) exposure to covid-19: Secondary | ICD-10-CM | POA: Diagnosis present

## 2020-04-05 DIAGNOSIS — I428 Other cardiomyopathies: Secondary | ICD-10-CM | POA: Diagnosis present

## 2020-04-05 DIAGNOSIS — R1011 Right upper quadrant pain: Secondary | ICD-10-CM | POA: Diagnosis present

## 2020-04-05 DIAGNOSIS — I5043 Acute on chronic combined systolic (congestive) and diastolic (congestive) heart failure: Secondary | ICD-10-CM | POA: Diagnosis present

## 2020-04-05 DIAGNOSIS — R7989 Other specified abnormal findings of blood chemistry: Secondary | ICD-10-CM | POA: Diagnosis present

## 2020-04-05 DIAGNOSIS — R252 Cramp and spasm: Secondary | ICD-10-CM | POA: Diagnosis not present

## 2020-04-05 DIAGNOSIS — Z7901 Long term (current) use of anticoagulants: Secondary | ICD-10-CM | POA: Diagnosis not present

## 2020-04-05 DIAGNOSIS — Z8673 Personal history of transient ischemic attack (TIA), and cerebral infarction without residual deficits: Secondary | ICD-10-CM | POA: Diagnosis not present

## 2020-04-05 DIAGNOSIS — Z79899 Other long term (current) drug therapy: Secondary | ICD-10-CM | POA: Diagnosis not present

## 2020-04-05 DIAGNOSIS — I509 Heart failure, unspecified: Secondary | ICD-10-CM

## 2020-04-05 DIAGNOSIS — Z716 Tobacco abuse counseling: Secondary | ICD-10-CM | POA: Diagnosis not present

## 2020-04-05 LAB — BASIC METABOLIC PANEL
Anion gap: 10 (ref 5–15)
BUN: 18 mg/dL (ref 6–20)
CO2: 34 mmol/L — ABNORMAL HIGH (ref 22–32)
Calcium: 8.9 mg/dL (ref 8.9–10.3)
Chloride: 94 mmol/L — ABNORMAL LOW (ref 98–111)
Creatinine, Ser: 1.74 mg/dL — ABNORMAL HIGH (ref 0.61–1.24)
GFR calc Af Amer: 48 mL/min — ABNORMAL LOW (ref >60)
GFR calc non Af Amer: 42 mL/min — ABNORMAL LOW (ref >60)
Glucose, Bld: 85 mg/dL (ref 70–99)
Potassium: 4.2 mmol/L (ref 3.5–5.1)
Sodium: 138 mmol/L (ref 135–145)

## 2020-04-05 LAB — ECHOCARDIOGRAM COMPLETE
AR max vel: 1.78 cm2
AV Area VTI: 1.74 cm2
AV Area mean vel: 1.67 cm2
AV Mean grad: 3.3 mmHg
AV Peak grad: 6 mmHg
Ao pk vel: 1.23 m/s
Area-P 1/2: 4.39 cm2
Height: 69 in
S' Lateral: 3.71 cm
Weight: 3745.6 oz

## 2020-04-05 LAB — MAGNESIUM: Magnesium: 1.9 mg/dL (ref 1.7–2.4)

## 2020-04-05 MED ORDER — APIXABAN 5 MG PO TABS
5.0000 mg | ORAL_TABLET | Freq: Two times a day (BID) | ORAL | Status: DC
  Administered 2020-04-05 – 2020-04-07 (×6): 5 mg via ORAL
  Filled 2020-04-05 (×6): qty 1

## 2020-04-05 MED ORDER — SODIUM CHLORIDE 0.9% FLUSH
3.0000 mL | Freq: Two times a day (BID) | INTRAVENOUS | Status: DC
  Administered 2020-04-05 – 2020-04-07 (×5): 3 mL via INTRAVENOUS

## 2020-04-05 MED ORDER — APIXABAN 5 MG PO TABS: 5.0000 mg | ORAL_TABLET | Freq: Two times a day (BID) | ORAL | Status: DC

## 2020-04-05 MED ORDER — ACETAMINOPHEN 325 MG PO TABS
650.0000 mg | ORAL_TABLET | ORAL | Status: DC | PRN
  Administered 2020-04-05: 650 mg via ORAL
  Filled 2020-04-05: qty 2

## 2020-04-05 MED ORDER — SODIUM CHLORIDE 0.9% FLUSH: 3.0000 mL | INTRAVENOUS | Status: DC | PRN

## 2020-04-05 MED ORDER — ATORVASTATIN CALCIUM 80 MG PO TABS
80.0000 mg | ORAL_TABLET | Freq: Every day | ORAL | Status: DC
  Administered 2020-04-05 – 2020-04-06 (×3): 80 mg via ORAL
  Filled 2020-04-05: qty 1
  Filled 2020-04-05 (×2): qty 4
  Filled 2020-04-05: qty 1

## 2020-04-05 MED ORDER — LOSARTAN POTASSIUM 25 MG PO TABS
25.0000 mg | ORAL_TABLET | Freq: Every day | ORAL | Status: DC
  Administered 2020-04-05 – 2020-04-07 (×3): 25 mg via ORAL
  Filled 2020-04-05 (×3): qty 1

## 2020-04-05 MED ORDER — ONDANSETRON HCL 4 MG/2ML IJ SOLN: 4.0000 mg | Freq: Four times a day (QID) | INTRAMUSCULAR | Status: DC | PRN

## 2020-04-05 MED ORDER — METHOCARBAMOL 500 MG PO TABS
500.0000 mg | ORAL_TABLET | Freq: Three times a day (TID) | ORAL | Status: DC | PRN
  Administered 2020-04-05 – 2020-04-06 (×2): 500 mg via ORAL
  Filled 2020-04-05 (×4): qty 1

## 2020-04-05 MED ORDER — CARVEDILOL 25 MG PO TABS
25.0000 mg | ORAL_TABLET | Freq: Two times a day (BID) | ORAL | Status: DC
  Administered 2020-04-05 – 2020-04-07 (×6): 25 mg via ORAL
  Filled 2020-04-05 (×7): qty 1

## 2020-04-05 MED ORDER — ASPIRIN EC 81 MG PO TBEC
81.0000 mg | DELAYED_RELEASE_TABLET | Freq: Every day | ORAL | Status: DC
  Administered 2020-04-05 – 2020-04-07 (×3): 81 mg via ORAL
  Filled 2020-04-05 (×3): qty 1

## 2020-04-05 MED ORDER — HYDRALAZINE HCL 50 MG PO TABS
50.0000 mg | ORAL_TABLET | Freq: Three times a day (TID) | ORAL | Status: DC
  Administered 2020-04-05 – 2020-04-07 (×9): 50 mg via ORAL
  Filled 2020-04-05 (×9): qty 1

## 2020-04-05 MED ORDER — ISOSORBIDE MONONITRATE ER 30 MG PO TB24
15.0000 mg | ORAL_TABLET | Freq: Every day | ORAL | Status: DC
  Administered 2020-04-05 – 2020-04-07 (×3): 15 mg via ORAL
  Filled 2020-04-05 (×3): qty 1

## 2020-04-05 MED ORDER — FUROSEMIDE 10 MG/ML IJ SOLN
60.0000 mg | Freq: Two times a day (BID) | INTRAMUSCULAR | Status: DC
  Administered 2020-04-05 – 2020-04-07 (×5): 60 mg via INTRAVENOUS
  Filled 2020-04-05: qty 6
  Filled 2020-04-05: qty 8
  Filled 2020-04-05 (×3): qty 6

## 2020-04-05 MED ORDER — SODIUM CHLORIDE 0.9 % IV SOLN: 250.0000 mL | INTRAVENOUS | Status: DC | PRN

## 2020-04-05 NOTE — Progress Notes (Signed)
*  PRELIMINARY RESULTS* Echocardiogram 2D Echocardiogram has been performed.  Cristela Blue 04/05/2020, 11:46 AM

## 2020-04-05 NOTE — Consult Note (Signed)
Cardiology Consultation:   Patient ID: Gregory Howell MRN: [id number]; DOB: 03-18-1961  Admit date: 04/04/2020 Date of Consult: 04/05/2020  Primary Care Provider: [facility] Primary Cardiologist: [place], Dr. Mariah Milling rounding ([facility] beds [facility]) Primary Electrophysiologist:  None    Patient Profile:   Gregory Howell is a 59 y.o. male with a hx of HFrEF EF 25-30% with subsequent normalization of EF by preliminary read of echo this admission, hypertension, tobacco use (1 cigarette daily), previous alcohol use, previously medication noncompliance, h/o supply demand ischemia/chest pain,  "light stroke," ~1990s with details unclear, and who is being seen today for the evaluation of suspected acute on chronic exacerbation of heart failure at the request of Dr. Denton Lank.  History of Present Illness:   Gregory Howell is a 59 year old male with PMH as above.  He has history of chest pain thought likely due to supply demand ischemia, dating back to 2018. He has a long history of tobacco use however, as of his most recent admission, he has reduced his smoking down to 1 cigarette qd.  He also has history of previous alcohol use but quit s/p 11/2019 admission. He has family history of cardiac disease, including his brother with first MI (65 yo).  He also reports HF in both parents.  He reports a "light stroke," ~1990s with details unclear.   Prior to his current admission, he was admitted 12/03/2019 with worsening heart failure symptoms. EKG showed new onset atrial fibrillation without any acute ST or T changes. Alcohol detected but otherwise tox screen negative.He was febrile and started on antibiotics.  He was started on IV lasix 2/2 small bilateral pleural effusions on CT and trace pericardial effusion with improvement in his symptoms.  A small amount of pericholecystic fluid with surrounding fat stranding changes was noted, thought 2/2 calculus cholecystitis with mild inflammatory changes tracking into the  right pericolic gutter.  Echo performed and showed EF 25-30%.  He underwent [facility] 12/07/19 that found nonobstructive CAD, mildly elevated LVEDP, severely reduced LVSF, and hemodynamic findings consistent with mild pulmonary hypertension. See below for cath report. He was successfully diuresed with improvement in symptoms. He was discharged on Eliquis 5 mg twice daily, Coreg, losartan, Imdur, Lipitor, p.o. Lasix.  Plan was to transition to [facility] as an outpatient.  Spironolactone was not started due to CKD.  It was noted he would need follow-up echo after GDMT optimized and NSR restored.  If EF remained less than 35%, referral to EP was recommended.  It still in A. fib at follow-up, DCCV recommended.   He has been following at the [facility] cardiologist since then. He denies starting Entresto through the [place]. He has not obtained a follow-up echo with them. To his knowledge, he has remained in Afib since April.  A sleep study has been discussed; however, at this time, he has not yet had the study and instead was prescribed sleeping pills.  [facility]  Since his last admission, he has continued to try to eat healthy and denies salt intake.  He still does not have a regular workout routine.  As above, he is down to 1 cigarette daily and denies any alcohol. Despite medication compliance and follow-up with the [place], he reports ongoing symptoms of heart failure since 11/2019; however, HF sx have been progressive in nature since 11/2019 discharge.  He first noticed shortness of breath. Over the next few weeks, he noticed dyspnea, cough,  PND, orthopnea, abdominal distention, and early satiety.  He reported nausea and throwing up clear fluid.  He reported ongoing right-sided pain, attributed to his issues with his gallbladder.  No presyncope, syncope, racing heart rate, or palpitations.  A few weeks ago, he noticed new bilateral ankle edema.  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  He came home and was sitting down at approximately 7 -8 PM after work when he had 8/10 in severity, left-sided, non-radiating, very pleuritic CP at rest that lasted a few hours with no clear exacerbating or alleviating factors.  He at first thought the symptoms were 2/2 heartburn; however, he had not recently eaten.  Associated symptoms included diaphoreis, stating he needed to use a towel.  He had never had CP like that before.  The next day felt very fatigued and anxious.  Today, he was at [facility] and felt so poorly he decided to report to [facility].   In the ED 8/26, vitals significant for atrial fibrillation with ventricular rate at 93 bpm, tachypnea, 96% on room air, BP 141/95.  Labs significant for creatinine 1.60, BUN 13, HS Tn 44 x2, WBC 11.5, D-dimer 3569.39, magnesium 1.9, BNP 694.3, indirect bilirubin 2.2, bilirubin 0.6. Chest x-ray showed stable interstitial prominence, possible edema versus interstitial lung disease.  CTA without evidence of PE and showed cardiomegaly, trace right pleural effusion, mildly enlarged hilar and mediastinal lymph nodes (similar to previous CT, likely reactive), trace perihepatic free fluid, and aortic atherosclerosis.  EKG showed atrial fibrillation with ventricular rate 100 bpm, T wave inversion and mild ST depression noted in 1, V5, V6.  At the time of cardiology consultation, he reports R sided CP / cramping around his gallbladder.  Repeat EKG ordered by IM and pending. RUQ US showed gallbladder wall thickening in part likely related to decompression.  Possible chronic cholecystitis.  Heart Pathway Score:     Past Medical History:  Diagnosis Date  . HTN (hypertension)     Past Surgical History:  Procedure Laterality Date  . RIGHT/LEFT HEART CATH AND CORONARY ANGIOGRAPHY N/A 12/07/2019   Procedure: RIGHT/LEFT HEART CATH AND CORONARY ANGIOGRAPHY;  Surgeon: Antonieta Iba, MD;  Location: [facility] INVASIVE CV LAB;  Service: Cardiovascular;  Laterality: N/A;     Home Medications:  Prior to Admission medications   Medication Sig Start Date End Date Taking? Authorizing Provider  aspirin EC 81 MG EC tablet Take 1 tablet (81 mg total) by mouth daily. Patient not taking: Reported on 12/04/2019 11/03/16   Milagros Loll, MD  lisinopril-hydrochlorothiazide (ZESTORETIC) 20-12.5 MG tablet Take 1 tablet by mouth daily. Patient not taking: Reported on 12/04/2019 11/02/16   Milagros Loll, MD  sulfamethoxazole-trimethoprim (BACTRIM DS) 800-160 MG tablet Take 1 tablet by mouth 2 (two) times daily. Patient not taking: Reported on 12/04/2019 10/12/19   Menshew, Charlesetta Ivory, PA-C    Inpatient Medications: Scheduled Meds: . apixaban  5 mg Oral BID  . aspirin EC  81 mg Oral Daily  . atorvastatin  80 mg Oral Daily  . carvedilol  25 mg Oral BID WC  . furosemide  60 mg Intravenous BID  . hydrALAZINE  50 mg Oral Q8H  . isosorbide mononitrate  15 mg Oral Daily  . losartan  25 mg Oral Daily  . sodium chloride flush  3 mL Intravenous Q12H   Continuous Infusions: . sodium chloride  PRN Meds: sodium chloride, acetaminophen, ondansetron (ZOFRAN) IV, sodium chloride flush  Allergies:   No Known Allergies  Social History:   Social History   Socioeconomic History  . Marital status: Married    Spouse name: Not on file  . Number of children: Not on file  . Years of education: Not on file  . Highest education level: Not on file  Occupational History  . Not on file  Tobacco Use  . Smoking status: Current Every Day Smoker    Packs/day: 0.50    Types: Cigarettes  . Smokeless tobacco: Never Used  Substance and Sexual Activity  . Alcohol use: Yes    Alcohol/week: 7.0 - 14.0 standard drinks    Types: 7 - 14 Cans of beer per week    Comment: daily  . Drug use: Never  . Sexual activity: Not on file  Other Topics Concern  . Not on file  Social History Narrative  .  Not on file   Social Determinants of Health   Financial Resource Strain:   . Difficulty of Paying Living Expenses: Not on file  Food Insecurity:   . Worried About Programme researcher, broadcasting/film/video in the Last Year: Not on file  . Ran Out of Food in the Last Year: Not on file  Transportation Needs:   . Lack of Transportation (Medical): Not on file  . Lack of Transportation (Non-Medical): Not on file  Physical Activity:   . Days of Exercise per Week: Not on file  . Minutes of Exercise per Session: Not on file  Stress:   . Feeling of Stress : Not on file  Social Connections:   . Frequency of Communication with Friends and Family: Not on file  . Frequency of Social Gatherings with Friends and Family: Not on file  . Attends Religious Services: Not on file  . Active Member of Clubs or Organizations: Not on file  . Attends Banker Meetings: Not on file  . Marital Status: Not on file  Intimate Partner Violence:   . Fear of Current or Ex-Partner: Not on file  . Emotionally Abused: Not on file  . Physically Abused: Not on file  . Sexually Abused: Not on file    Family History:    Family History  Problem Relation Age of Onset  . Hypertension Mother   . Hypertension Father   . CAD Brother   . Diabetes Brother      ROS:  Please see the history of present illness.  Review of Systems  Constitutional: Positive for malaise/fatigue.  Respiratory: Positive for cough and shortness of breath. Negative for hemoptysis.   Cardiovascular: Positive for chest pain, orthopnea, leg swelling and PND. Negative for palpitations.       CP last night for a few hours, left sided, pleuritic. No CP like that before Swollen ankles Abdominal distention  Gastrointestinal: Positive for abdominal pain, heartburn, nausea and vomiting. Negative for blood in stool and melena.       Vomiting fluid, clear  Genitourinary: Negative for hematuria.  Musculoskeletal: Negative for falls.  Neurological: Negative for  focal weakness and weakness.  All other systems reviewed and are negative.   All other ROS reviewed and negative.     Physical Exam/Data:   Vitals:   04/05/20 0300 04/05/20 0400 04/05/20 0444 04/05/20 0500  BP: (!) 143/111 (!) 130/93  (!) 131/97  Pulse: 86 93  85  Resp:  (!) 24  19  Temp:  TempSrc:  Oral    SpO2: (!) 87% 93%  91%  Weight:   106.2 kg   Height:        Intake/Output Summary (Last 24 hours) at 04/05/2020 1057 Last data filed at 04/05/2020 1002 Gross per 24 hour  Intake --  Output 2350 ml  Net -2350 ml   Last 3 Weights 04/05/2020 04/04/2020 12/08/2019  Weight (lbs) 234 lb 1.6 oz 220 lb 207 lb 12.8 oz  Weight (kg) 106.187 kg 99.791 kg 94.257 kg     Body mass index is 34.57 kg/m.  General:  Well nourished, well developed, in no acute distress.  Initially, he was standing in the ED hallway after using the restroom and looked uncomfortable and noticeably SOB while holding his right side and leaning against the wall.  He was assisted back to his room, where a chair was provided for him to sit and during our interview and sx improved then resolved. HEENT: normal Neck: JVD difficult to assess due to patient position and body habitus Vascular: No carotid bruits; radial pulses 2+ bilaterally Cardiac:  normal S1, S2; IRIR and tachycardic; no murmur  Lungs: Bibasilar reduced breath sounds with slight R base crackles, no wheezing, rhonchi or rales  Abd: Firm, distended, no hepatomegaly  Ext: mild ankle bilateral LEE Musculoskeletal:  No deformities, BUE and BLE strength normal and equal Skin: warm and dry  Neuro:  No focal abnormalities noted Psych:  Normal affect   EKG:  The EKG was personally reviewed and demonstrates: atrial fibrillation with ventricular rate 100 bpm, T wave inversion and mild ST depression noted in 1, V5, V6. Telemetry:  Telemetry was personally reviewed and demonstrates: Atrial fibrillation, rates 90s to 120s, mild ST depression noted in lateral  telemetry leads  Relevant CV Studies: 2D echo 12/04/2019: 1. Left ventricular ejection fraction, by estimation, is 25 to 30%. The  left ventricle has severely decreased function. The left ventricle  demonstrates global hypokinesis. There is moderate left ventricular  hypertrophy. Left ventricular diastolic  parameters are indeterminate.  2. Right ventricular systolic function is severely reduced. The right  ventricular size is normal. There is moderately elevated pulmonary artery  systolic pressure. The estimated right ventricular systolic pressure is  64.8 mmHg.  3. Left atrial size was mildly dilated.  4. Right atrial size was mildly dilated.  5. The mitral valve is normal in structure. Mild mitral valve  regurgitation.  6. The aortic valve was not well visualized. Aortic valve regurgitation  is not visualized. No aortic stenosis is present.  7. The inferior vena cava is dilated in size with <50% respiratory  variability, suggesting right atrial pressure of 15 mmHg. __________  [facility] 12/07/2019: Coronary dominance: Right  Left mainstem: Large vessel that bifurcates into the LAD and left circumflex, no significant disease noted  Left anterior descending (LAD): Large vessel that extends to the apical region, diagonal branch 2 of moderate size, no significant disease noted, mild diffuse disease LAD, diagonal vessels  Left circumflex (LCx): Large vessel with OM branch 3, OM1 with mild 40% proximal disease, OM 2 is essentially occluded in the proximal region with collaterals/small, atretic, OM 3 has 60% mid vessel disease  Right coronary artery (RCA): Right dominant vessel with PL and PDA, 50% ostial disease, 30% proximal to mid vessel disease, 60% PL branch disease  Left ventriculography: Left ventricular systolic function is severely depressed, mildly dilated LV, LVEF is estimated at 25%, no significant aortic valve stenosis Mild to moderate MR  Right heart  pressures RA 10 mmHg RV 45/5, 8 PA 47/27, mean 36 Wedge pressure mean 20 LV 137/13 Ao 129/91  Cardiac output 3.31 cardiac index 1.58   Final Conclusions:  Severely depressed LV function estimated 25%, Nonobstructive coronary disease Coronary disease out of proportion to depressed ejection fraction -Mildly elevated right heart pressures and wedge   Recommendations:  Continue carvedilol, hydralazine, isosorbide Aspirin, statin Smoking cessation recommended Alcohol cessation recommended (recently moved from "beer to brown liquor") --Start low-dose losartan tomorrow Transition to [facility] as an outpatient -Blood pressure continues to run high, will increase isosorbide up to 30 daily  Laboratory Data:  High Sensitivity Troponin:   Recent Labs  Lab 04/04/20 1646 04/04/20 1854  TROPONINIHS 44* 44*     Cardiac EnzymesNo results for input(s): TROPONINI in the last 168 hours. No results for input(s): TROPIPOC in the last 168 hours.  Chemistry Recent Labs  Lab 04/04/20 1646  NA 137  K 4.2  CL 102  CO2 26  GLUCOSE 110*  BUN 13  CREATININE 1.60*  CALCIUM 8.4*  GFRNONAA 46*  GFRAA 53*  ANIONGAP 9    Recent Labs  Lab 04/04/20 1713  PROT 7.3  ALBUMIN 3.8  AST 31  ALT 25  ALKPHOS 104  BILITOT 2.8*   Hematology Recent Labs  Lab 04/04/20 1646 04/04/20 1713  WBC 11.5* 11.8*  RBC 4.74 4.71  HGB 14.5 14.2  HCT 40.2 39.0  MCV 84.8 82.8  MCH 30.6 30.1  MCHC 36.1* 36.4*  RDW 17.4* 17.4*  PLT 229 231   BNP Recent Labs  Lab 04/04/20 1713  BNP 694.3*    DDimer No results for input(s): DDIMER in the last 168 hours.   Radiology/Studies:  DG Chest 2 View  Result Date: 04/04/2020 CLINICAL DATA:  Shortness of breath EXAM: CHEST - 2 VIEW COMPARISON:  12/03/2019 FINDINGS: Cardiomegaly. Peribronchial thickening and interstitial prominence, similar to prior study. No effusions. No acute bony abnormality. IMPRESSION: Stable interstitial prominence throughout the  lungs could reflect interstitial edema or chronic interstitial lung disease. Electronically Signed   By: Charlett Nose M.D.   On: 04/04/2020 17:06   CT Angio Chest PE W and/or Wo Contrast  Result Date: 04/04/2020 CLINICAL DATA:  60 year old male with shortness of breath and positive D-dimer. Concern for pulmonary embolism. EXAM: CT ANGIOGRAPHY CHEST WITH CONTRAST TECHNIQUE: Multidetector CT imaging of the chest was performed using the standard protocol during bolus administration of intravenous contrast. Multiplanar CT image reconstructions and MIPs were obtained to evaluate the vascular anatomy. CONTRAST:  OMNIPAQUE IOHEXOL 350 MG/ML SOLN COMPARISON:  Chest CT dated 12/04/2019 and radiograph dated 04/04/2020 FINDINGS: Cardiovascular: There is mild cardiomegaly. No pericardial effusion. There is retrograde flow of contrast from the right atrium into the IVC suggestive of a degree of right heart dysfunction. Three vessel coronary vascular calcification. There is mild atherosclerotic calcification of the thoracic aorta. No pulmonary artery embolus identified. Mediastinum/Nodes: Mildly enlarged hilar and mediastinal lymph nodes measures 15 mm in the right hilum, 12 mm in the subcarinal region, 12 mm in the right paratracheal region and 9 mm in short axis in the prevascular space. These are relatively similar to prior CT. The esophagus and the thyroid gland are grossly unremarkable. No mediastinal fluid collection. Lungs/Pleura: Trace right pleural effusion. There is diffuse interstitial prominence and hazy airspace density throughout the lungs concerning for edema. Pneumonia is less likely but not excluded. Clinical correlation is recommended. There is no focal consolidation or pneumothorax. The central airways are patent. Upper  Abdomen: Trace perihepatic free fluid. There is minimal irregularity of the liver contour which may represent early changes of cirrhosis. Clinical correlation is recommended.  Musculoskeletal: No chest wall abnormality. No acute or significant osseous findings. Review of the MIP images confirms the above findings. IMPRESSION: 1. No CT evidence of pulmonary embolism. 2. Cardiomegaly with findings of CHF and a trace right pleural effusion. 3. Mildly enlarged hilar and mediastinal lymph nodes, similar to prior CT, likely reactive. 4. Trace perihepatic free fluid. 5. Aortic Atherosclerosis (ICD10-I70.0). Electronically Signed   By: Elgie Collard M.D.   On: 04/04/2020 19:43   US Abdomen Limited RUQ  Result Date: 04/04/2020 CLINICAL DATA:  Right upper quadrant pain for 2 days EXAM: ULTRASOUND ABDOMEN LIMITED RIGHT UPPER QUADRANT COMPARISON:  12/04/2019 FINDINGS: Gallbladder: Gallbladder is predominately decompressed although the wall is significantly thickened 8 mm. Negative sonographic Murphy's sign is noted however. No stones are seen. Common bile duct: Diameter: 3 mm Liver: No focal lesion identified. Within normal limits in parenchymal echogenicity. Portal vein is patent on color Doppler imaging with normal direction of blood flow towards the liver. Other: None. IMPRESSION: Gallbladder wall thickening in part likely related to decompression. Negative sonographic Murphy's sign is noted. These changes could represent some chronic cholecystitis. No other focal abnormality is noted. Electronically Signed   By: Alcide Clever M.D.   On: 04/04/2020 19:00    Assessment and Plan:   Acuteon chronicsystolic heart failure(25-30%)   Pulmonary HTN --Progressive HF sx as detailed above and similar to 11/2019 admission. Volume up on exam. Suspect worsening volume status 2/2 elevated ventricular rates in the setting of pain +/- increased stress with new job. Considered also is his new CP. Echo  11/2019 LVEF 25 to 30% LV hypokinesis and LVH. RHC 11/2019 as above with elevated R heart pressures. Ddimer significantly elevated. CTA without evidence of PE.  BNP 694.3. Chest x-ray suspicious for  edema versus lung dz. --Continue IV diuresis, as volume up on exam. --Continue to monitor I/O, daily weights.   --Daily BMET.  Caution with diuresis given AKI. --Replete electrolytes as needed. --Caution with ACE/ARB/ARNI given AOCKD. --CHF education.  Low-salt diet.  Fluid restriction. --Repeat echo pending official read with further recommendations at that time if needed.  Chest pain Elevated HS Tn --No current chest pain. 2 days ago, had an episode of left sided chest pain that lasted several hours.  He never had an episode like this before in the past.  Less consistent with ACS. EKG at presentation significant for atrial fibrillation with T wave inversion in the lateral leads and mild ST depression in V5/V6 in the setting of rapid rate. HS Tn 44 x2.  D-dimer significantly elevated with CTA negative for PE. Considered is supply demand ischemia with CP and HS Tn elevated like 2/2 of A. fib with RVR, hypertension, and volume overload. He does have risk factors for cardiac etiology, including previous history of smoking. However, recent 11/2019 LHC as above with nonobstructive CAD. --Eliquis in lieu of ASA.   --Continue Coreg.   --Continue ARB - recommend hold with worsening renal function. --Update echo pending MD read to reassess EF, valves, heart pressures, and rule out any acute structural changes.  Further recommendations pending echo. If EF still below 35%, consider ICD.  Persistent Atrial fibrillationwith RVR --Asymptomatic.  Suspect that elevated ventricular rates are at least in part contributing to current volume status.   --Continue BB / Coreg. Rate control goal ventricular rate <110. --Continue Eliquis. CHA2DS2VASc score  of at least3 (HTN, vascular, CHF) with recommendation for long term anticoagulation. Daily CBC. He reportedly has not missed any doses and is therapeutically anticoagulated. --No current plan for DCCV per MD. [facility] --Continue to recommend outpatient sleep study as below.   AOCKD --Cr 1.49  1.60. --Caution with ACE/ARB/ARNI given CKD.  ?Right sided flank pain, cholecystitis? --Per IM. [place] of RUQ without significant acute findings, thickening of gallbladder wall.  Suspected Sleep Apnea --Underwent a sleep study in the past; however, he never received results. He intends to repeat the sleep study at the [place] due to sx of OSA. We discussed that untreated sleep apnea is often associated with atrial fibrillation.  HTN --Titrate antihypertensives as needed. Caution with ACE/ARB/ARNI given CKD.  Tobacco use --Cessation advised. He intends to quit smoking.  For questions or updates, please contact [facility] Please consult [email or web address] for contact info under     Signed, Lennon Alstrom, PA-C  04/05/2020 10:57 AM

## 2020-04-05 NOTE — H&P (Signed)
[facility] History and Physical  Kinsey Cowsert DZH:[id number] DOB: [birth year] DOA: 04/04/2020  Referring physician: Dr. York Cerise PCP: [facility]   Chief Complaint: shortness of breath  HPI: Gregory Howell is a 59 y.o. male with history of HFrEF, hypertension, CAD, A. fib, who presents with shortness of breath.  Patient last admitted in April 2021.  At that time he was diagnosed with new systolic heart failure secondary to nonischemic cardiomyopathy as well as new diagnosis of A. fib.  During that admission he underwent left and right heart cath which showed global LV hypokinesis and nonobstructive coronary artery disease as well as moderate pulmonary hypertension.  Patient presents today due to several week history of worsening shortness of breath.  He states that in addition he has also been unable to lie flat to sleep and has had a nightly cough for some time.  He has noticed some mild edema in his lower extremities.  He endorses having chest pain last night that was similar to heartburn and for which she considered presenting to the ED but ultimately did not and it resolved.  He also notes exertional dyspnea making it difficult to perform his work duties.  He reports that he takes his medications regularly and thinks that he only misses a day here and there.  He denies increased intake of salty foods.  He reports that he was 205 pounds when he left the hospital last time but he most recently weighed himself he was 220 pounds.  In the ED initial vital signs notable for borderline tachycardia, tachypnea with respirations in the 20s to 30s, and very mild hypertension.  Lab work-up showed CMP remarkable for creatinine borderline elevated above patient's baseline of approximately 1.3-1.5 at 1.6, elevated troponin at 44 that was flat at 44 2 hours later, elevated BNP at 694, unremarkable CBC, elevated D-dimer at 3569.  EKG was obtained which showed a possibly new T wave inversion  in V5 but otherwise was unchanged from prior and did not have any ST segment changes.  Ober test was negative he underwent a CTPA which showed no evidence of PE and findings most consistent with acute CHF.  He also had a right upper quadrant abdominal ultrasound which showed nonspecific gallbladder signs but otherwise no acute findings.  He was given 60 mg IV Lasix for CHF exacerbation.  He was admitted for further management of his CHF exacerbation.   Review of Systems:  Pertinent positives and negative per HPI, all others reviewed and negative   Past Medical History:  Diagnosis Date  . HTN (hypertension)    Past Surgical History:  Procedure Laterality Date  . RIGHT/LEFT HEART CATH AND CORONARY ANGIOGRAPHY N/A 12/07/2019   Procedure: RIGHT/LEFT HEART CATH AND CORONARY ANGIOGRAPHY;  Surgeon: Antonieta Iba, MD;  Location: [facility] INVASIVE CV LAB;  Service: Cardiovascular;  Laterality: N/A;   Social History:  reports that he has been smoking cigarettes. He has been smoking about 0.50 packs per day. He has never used smokeless tobacco. He reports current alcohol use of about 7.0 - 14.0 standard drinks of alcohol per week. He reports that he does not use drugs.  No Known Allergies  Family History  Problem Relation Age of Onset  . Hypertension Mother   . Hypertension Father   . CAD Brother   . Diabetes Brother      Prior to Admission medications   Medication Sig Start Date End Date Taking? Authorizing Provider  apixaban (ELIQUIS) 5 MG TABS  tablet Take 1 tablet (5 mg total) by mouth 2 (two) times daily. 12/08/19  Yes Standley Brooking, MD  aspirin EC 81 MG EC tablet Take 1 tablet (81 mg total) by mouth daily. 11/03/16  Yes Sudini, Wardell Heath, MD  atorvastatin (LIPITOR) 80 MG tablet Take 1 tablet (80 mg total) by mouth daily. 12/08/19  Yes Standley Brooking, MD  carvedilol (COREG) 25 MG tablet Take 1 tablet (25 mg total) by mouth 2 (two) times daily with a meal. 12/08/19  Yes Standley Brooking, MD  furosemide (LASIX) 40 MG tablet Take 1 tablet (40 mg total) by mouth daily. 12/09/19  Yes Standley Brooking, MD  hydrALAZINE (APRESOLINE) 50 MG tablet Take 1 tablet (50 mg total) by mouth every 8 (eight) hours. 12/08/19  Yes Standley Brooking, MD  isosorbide mononitrate (IMDUR) 30 MG 24 hr tablet Take 0.5 tablets (15 mg total) by mouth daily. 12/09/19  Yes Standley Brooking, MD  losartan (COZAAR) 25 MG tablet Take 1 tablet (25 mg total) by mouth daily. 12/09/19  Yes Standley Brooking, MD   Physical Exam: Vitals:   04/04/20 1730 04/04/20 1800 04/04/20 1830 04/04/20 1900  BP: (!) 122/110 129/82 (!) 129/109 120/84  Pulse: 99 100 91 100  Resp: (!) 29 (!) 25 (!) 33 (!) 32  Temp:      TempSrc:      SpO2: 96% 95% 98% 97%  Weight:      Height:        Wt Readings from Last 3 Encounters:  04/04/20 99.8 kg  12/08/19 94.3 kg  10/31/19 91 kg    General:  Appears calm and comfortable Eyes: PERRL, normal lids, irises & conjunctiva ENT: grossly normal hearing, lips & tongue Neck: no masses Cardiovascular: Irregular rhythm, regular rate, no m/r/g. 1+ edema to mid shin bilaterally. JVD elevated to ear lobe. Telemetry: Afib Respiratory: Normal respiratory effort. Crackles present over lower half of R lung field. Abdomen: soft, ntnd Skin: no rash or induration seen on limited exam Musculoskeletal: grossly normal tone BUE/BLE Psychiatric: grossly normal mood and affect, speech fluent and appropriate Neurologic: grossly non-focal.          Labs on Admission:  Basic Metabolic Panel: Recent Labs  Lab 04/04/20 1646  NA 137  K 4.2  CL 102  CO2 26  GLUCOSE 110*  BUN 13  CREATININE 1.60*  CALCIUM 8.4*   Liver Function Tests: Recent Labs  Lab 04/04/20 1713  AST 31  ALT 25  ALKPHOS 104  BILITOT 2.8*  PROT 7.3  ALBUMIN 3.8   No results for input(s): LIPASE, AMYLASE in the last 168 hours. No results for input(s): AMMONIA in the last 168 hours. CBC: Recent Labs  Lab  04/04/20 1646 04/04/20 1713  WBC 11.5* 11.8*  NEUTROABS  --  8.6*  HGB 14.5 14.2  HCT 40.2 39.0  MCV 84.8 82.8  PLT 229 231   Cardiac Enzymes: No results for input(s): CKTOTAL, CKMB, CKMBINDEX, TROPONINI in the last 168 hours.  BNP (last 3 results) Recent Labs    12/03/19 2152 04/04/20 1713  BNP 660.0* 694.3*    ProBNP (last 3 results) No results for input(s): PROBNP in the last 8760 hours.  CBG: No results for input(s): GLUCAP in the last 168 hours.  Radiological Exams on Admission: DG Chest 2 View  Result Date: 04/04/2020 CLINICAL DATA:  Shortness of breath EXAM: CHEST - 2 VIEW COMPARISON:  12/03/2019 FINDINGS: Cardiomegaly. Peribronchial thickening and interstitial prominence, similar to  prior study. No effusions. No acute bony abnormality. IMPRESSION: Stable interstitial prominence throughout the lungs could reflect interstitial edema or chronic interstitial lung disease. Electronically Signed   By: Charlett Nose M.D.   On: 04/04/2020 17:06   CT Angio Chest PE W and/or Wo Contrast  Result Date: 04/04/2020 CLINICAL DATA:  59 year old male with shortness of breath and positive D-dimer. Concern for pulmonary embolism. EXAM: CT ANGIOGRAPHY CHEST WITH CONTRAST TECHNIQUE: Multidetector CT imaging of the chest was performed using the standard protocol during bolus administration of intravenous contrast. Multiplanar CT image reconstructions and MIPs were obtained to evaluate the vascular anatomy. CONTRAST:  OMNIPAQUE IOHEXOL 350 MG/ML SOLN COMPARISON:  Chest CT dated 12/04/2019 and radiograph dated 04/04/2020 FINDINGS: Cardiovascular: There is mild cardiomegaly. No pericardial effusion. There is retrograde flow of contrast from the right atrium into the IVC suggestive of a degree of right heart dysfunction. Three vessel coronary vascular calcification. There is mild atherosclerotic calcification of the thoracic aorta. No pulmonary artery embolus identified. Mediastinum/Nodes:  Mildly enlarged hilar and mediastinal lymph nodes measures 15 mm in the right hilum, 12 mm in the subcarinal region, 12 mm in the right paratracheal region and 9 mm in short axis in the prevascular space. These are relatively similar to prior CT. The esophagus and the thyroid gland are grossly unremarkable. No mediastinal fluid collection. Lungs/Pleura: Trace right pleural effusion. There is diffuse interstitial prominence and hazy airspace density throughout the lungs concerning for edema. Pneumonia is less likely but not excluded. Clinical correlation is recommended. There is no focal consolidation or pneumothorax. The central airways are patent. Upper Abdomen: Trace perihepatic free fluid. There is minimal irregularity of the liver contour which may represent early changes of cirrhosis. Clinical correlation is recommended. Musculoskeletal: No chest wall abnormality. No acute or significant osseous findings. Review of the MIP images confirms the above findings. IMPRESSION: 1. No CT evidence of pulmonary embolism. 2. Cardiomegaly with findings of CHF and a trace right pleural effusion. 3. Mildly enlarged hilar and mediastinal lymph nodes, similar to prior CT, likely reactive. 4. Trace perihepatic free fluid. 5. Aortic Atherosclerosis (ICD10-I70.0). Electronically Signed   By: Elgie Collard M.D.   On: 04/04/2020 19:43   US Abdomen Limited RUQ  Result Date: 04/04/2020 CLINICAL DATA:  Right upper quadrant pain for 2 days EXAM: ULTRASOUND ABDOMEN LIMITED RIGHT UPPER QUADRANT COMPARISON:  12/04/2019 FINDINGS: Gallbladder: Gallbladder is predominately decompressed although the wall is significantly thickened 8 mm. Negative sonographic Murphy's sign is noted however. No stones are seen. Common bile duct: Diameter: 3 mm Liver: No focal lesion identified. Within normal limits in parenchymal echogenicity. Portal vein is patent on color Doppler imaging with normal direction of blood flow towards the liver. Other: None.  IMPRESSION: Gallbladder wall thickening in part likely related to decompression. Negative sonographic Murphy's sign is noted. These changes could represent some chronic cholecystitis. No other focal abnormality is noted. Electronically Signed   By: Alcide Clever M.D.   On: 04/04/2020 19:00    EKG: Independently reviewed.  A. fib, T wave inversions in V5 and V6.  Compared to prior T wave inversion in V5 is new.  Assessment/Plan Active Problems:   Nonobstructive atherosclerosis of coronary artery   Acute CHF (congestive heart failure) (HCC)   #CHF Exacerbation #Elevated troponin Patient presenting with orthopnea, elevated BNP, and signs on exam of volume overload consistent with CHF exacerbation.  Etiology unclear, patient endorses medication compliance, no dietary indiscretions.  Does report episodes of chest pain but has  known nonobstructive CAD with nonischemic cardiomyopathy that is unlikely to progress significantly in the past 4 months.  Elevated troponin likely secondary to demand in the setting of volume overload, no significant EKG changes to suggest ACS. -IV Lasix 60 mg twice daily -Telemetry -Daily weights -Strict I/O -Replete electrolytes, K greater than 4, mag greater than 2 -Repeat TTE -Consult heart failure team in a.m. -Continue aspirin, atorvastatin, carvedilol, losartan -If EF remains significantly depressed on TTE consideration should be given to ICD at this point  #Chronic Medical Problems A. fib: Continue apixaban, carvedilol Hypertension: Continue hydralazine, carvedilol, losartan  Code Status: Full Code, presumed DVT Prophylaxis: Lovenox Family Communication: None Disposition Plan: Inpatient, Progressive cardiac  Time spent: 50 min  Venora Maples MD/MPH [facility]

## 2020-04-05 NOTE — Progress Notes (Signed)
  PROGRESS NOTE    Gregory Howell  AYG:[id number] DOB: [birth year] DOA: 04/04/2020  PCP: [facility]    LOS - 0   Same Date Rounding Note   Patient admitted overnight with acute on chronic HFrEF.  Undergoing IV diuresis.    Interval subjective: Patient seen in ED on hold for a bed this morning.  Says he is feeling a little better.  Reports some muscle cramping.  Exam: trace lower extremity edema, lungs with bilateral crackles, abdomen distended but nontender.  I have reviewed the full H&P by Dr. Crissie Reese in detail, and I agree with the assessment and plan as outlined therein. --Cardiology following   No Charge    Pennie Banter, DO [facility]   If 7PM-7AM, please contact night-coverage [email or web address] 04/05/2020, 9:18 AM

## 2020-04-05 NOTE — Hospital Course (Signed)
Gregory Howell is a 59 y.o. male with history of HFrEF, hypertension, CAD, A. fib, who presented to the ED on 04/04/20 with progressive shortness of breath over the past several weeks.  Also endorsed orthopnea, nightly cough, mild lower extremity edema, and chest pains characterized as heart burn that resolved prior to presenting.    Recently admitted in April and diagnosed with HFrEF with non-ischemic cardiomyopathy new diagnosis of A-fib.  At that time, underwent left and right heart cath which showed global LV hypokinesis and nonobstructive coronary artery disease as well as moderate pulmonary hypertension.  Weight upon discharge was 205 lbs, pt reported recent weight at home was 220 lbs.  ED course: borderline tachycardia, tachypnea, mildly hypertensive, afebrile. Initial labs notable for Cr 1.6 (baseline appears around 1.3-1.5), troponin 44 x 2, BNP 694, normal CBC, D-Dimer up at 3569.  EKG similar to prior except for new T wave inversion in V5, no ST segment changes.  CTA chest negative for PE, did show cardiomegaly, findings of CHF and trace right pleural effusion. Treated with IV Lasix. Admitted to hospitalist service with cardiology consulted for further management of decompensated HFrEF.

## 2020-04-06 LAB — BASIC METABOLIC PANEL
Anion gap: 9 (ref 5–15)
BUN: 22 mg/dL — ABNORMAL HIGH (ref 6–20)
CO2: 26 mmol/L (ref 22–32)
Calcium: 8.6 mg/dL — ABNORMAL LOW (ref 8.9–10.3)
Chloride: 100 mmol/L (ref 98–111)
Creatinine, Ser: 1.77 mg/dL — ABNORMAL HIGH (ref 0.61–1.24)
GFR calc Af Amer: 47 mL/min — ABNORMAL LOW (ref >60)
GFR calc non Af Amer: 41 mL/min — ABNORMAL LOW (ref >60)
Glucose, Bld: 99 mg/dL (ref 70–99)
Potassium: 3.5 mmol/L (ref 3.5–5.1)
Sodium: 135 mmol/L (ref 135–145)

## 2020-04-06 LAB — CBC
HCT: 40.9 % (ref 39.0–52.0)
Hemoglobin: 14.5 g/dL (ref 13.0–17.0)
MCH: 30.3 pg (ref 26.0–34.0)
MCHC: 35.5 g/dL (ref 30.0–36.0)
MCV: 85.4 fL (ref 80.0–100.0)
Platelets: 239 10*3/uL (ref 150–400)
RBC: 4.79 MIL/uL (ref 4.22–5.81)
RDW: 17.5 % — ABNORMAL HIGH (ref 11.5–15.5)
WBC: 10.4 10*3/uL (ref 4.0–10.5)
nRBC: 0 % (ref 0.0–0.2)

## 2020-04-06 MED ORDER — POTASSIUM CHLORIDE CRYS ER 20 MEQ PO TBCR
40.0000 meq | EXTENDED_RELEASE_TABLET | Freq: Once | ORAL | Status: AC
  Administered 2020-04-06: 40 meq via ORAL
  Filled 2020-04-06: qty 2

## 2020-04-06 NOTE — Progress Notes (Signed)
Patient stated he has a living will and its on file with the [facility] hospital. Patient does not have a copy to submit to [facility]. RN educated extensively on tracking fluid intake. Patient stated he drink 3 pitcher of water in a few hours while sitting int he chair in his room today. RN printed additional heart failure and fluid intake monitoring materials for patient from clinical skills and reviewed with patient. Patient verbalized understanding and stated he will monitor his fluid intake more closely hy writing down all fluid intake daily to keep better track. Patient stated he will ask MD what is his recommended fluid intake in a 24 hour period.

## 2020-04-06 NOTE — Progress Notes (Signed)
PROGRESS NOTE    Jahlon Baines   GYJ:[id number]  DOB: [birth year]  PCP: [facility]    DOA: 04/04/2020 LOS: 1   Brief Narrative   Efstathios Sawin is a 59 y.o. male with history of HFrEF, hypertension, CAD, A. fib, who presented to the ED on 04/04/20 with progressive shortness of breath over the past several weeks.  Also endorsed orthopnea, nightly cough, mild lower extremity edema, and chest pains characterized as heart burn that resolved prior to presenting.    Recently admitted in April and diagnosed with HFrEF with non-ischemic cardiomyopathy new diagnosis of A-fib.  At that time, underwent left and right heart cath which showed global LV hypokinesis and nonobstructive coronary artery disease as well as moderate pulmonary hypertension.  Weight upon discharge was 205 lbs, pt reported recent weight at home was 220 lbs.  ED course: borderline tachycardia, tachypnea, mildly hypertensive, afebrile. Initial labs notable for Cr 1.6 (baseline appears around 1.3-1.5), troponin 44 x 2, BNP 694, normal CBC, D-Dimer up at 3569.  EKG similar to prior except for new T wave inversion in V5, no ST segment changes.  CTA chest negative for PE, did show cardiomegaly, findings of CHF and trace right pleural effusion. Treated with IV Lasix. Admitted to hospitalist service with cardiology consulted for further management of decompensated HFrEF.     Assessment & Plan   Active Problems:   Nonobstructive atherosclerosis of coronary artery   Acute CHF (congestive heart failure) (HCC)   Acute on chronic systolic (congestive) heart failure (HCC)   Acute on chronic combined systolic/diastolic CHF -present on admission with shortness of breath, abdominal distention and orthopnea, elevated BNP, signs of volume overload on exam and imaging - findings all consistent with acute CHF.  Symptoms were progressive over months despite reported compliance with his oral Lasix at home. Echo shows EF of 40 to  45%, moderate LVH with known hypertensive heart disease, severe left atrial enlargement, moderate right atrial enlargement. --Cardiology following -Continue IV Lasix 60 mg BID --Continue Coreg, losartan,  Imdur, hydralazine --Strict I/O's, daily weights --Telemetry monitoring --Monitor blood pressure, renal function and electrolytes closely --Follows with cardiology at the [facility]  Persistent A. fib, longstanding -rate controlled.  CHA2DS2-VASc score is 4. --Cardiology is following --Continue Eliquis and Coreg --Monitor on telemetry --Maintain K greater than 4, mag greater than 2  Essential hypertension -continue Coreg, hydralazine, losartan as above.  Monitor BP closely with diuresis.   Patient BMI: Body mass index is 32.83 kg/m.   DVT prophylaxis:  apixaban (ELIQUIS) tablet 5 mg   Diet:  Diet Orders (From admission, onward)    Start     Ordered   04/05/20 0142  Diet Heart Room service appropriate? Yes; Fluid consistency: Thin  Diet effective now       Question Answer Comment  Room service appropriate? Yes   Fluid consistency: Thin      04/05/20 0142            Code Status: Full Code    Subjective 04/06/20    Patient seen up in chair on rounds this morning.  Says he is feeling quite a bit better.  Muscle cramping was relieved by Robaxin.  He denies chest pain or shortness of breath, fevers chills, nausea vomiting diarrhea or other acute complaints.   Disposition Plan & Communication   Status is: Inpatient  Remains inpatient appropriate because:IV treatments appropriate due to intensity of illness or inability to take PO   Dispo: The  patient is from: Home              Anticipated d/c is to: Home              Anticipated d/c date is: 2 days              Patient currently is not medically stable to d/c.    Family Communication: None at bedside, will attempt to call   Consults, Procedures, Significant Events   Consultants:   Cardiology  Procedures:   2D  echo  Antimicrobials:   None   Objective   Vitals:   04/05/20 1957 04/06/20 0518 04/06/20 0543 04/06/20 0740  BP: 116/80 (!) 146/105 127/81 (!) 146/86  Pulse: 81 72 81 63  Resp: 20 16 19 18   Temp: 97.6 F (36.4 C) (!) 97.5 F (36.4 C) 98 F (36.7 C) 97.8 F (36.6 C)  TempSrc: Oral Oral Oral Oral  SpO2: 94% 97% 96% 99%  Weight:   100.8 kg   Height:        Intake/Output Summary (Last 24 hours) at 04/06/2020 0827 Last data filed at 04/06/2020 0740 Gross per 24 hour  Intake --  Output 3000 ml  Net -3000 ml   Filed Weights   04/05/20 0444 04/05/20 1641 04/06/20 0543  Weight: 106.2 kg 100.2 kg 100.8 kg    Physical Exam:  General exam: awake, alert, no acute distress Respiratory system: CTAB, no wheezes, rales or rhonchi, normal respiratory effort. Cardiovascular system: normal S1/S2, irregular rhythm, regular rate, no pedal edema.   Gastrointestinal system: Nontender, distention somewhat improved, positive bowel sounds Central nervous system: A&O x4. no gross focal neurologic deficits, normal speech Extremities: moves all, no edema, normal tone Psychiatry: normal mood, congruent affect, judgement and insight appear normal  Labs   Data Reviewed: I have personally reviewed following labs and imaging studies  CBC: Recent Labs  Lab 04/04/20 1646 04/04/20 1713 04/06/20 0637  WBC 11.5* 11.8* 10.4  NEUTROABS  --  8.6*  --   HGB 14.5 14.2 14.5  HCT 40.2 39.0 40.9  MCV 84.8 82.8 85.4  PLT 229 231 239   Basic Metabolic Panel: Recent Labs  Lab 04/04/20 1646 04/05/20 0443 04/05/20 1557  NA 137  --  138  K 4.2  --  4.2  CL 102  --  94*  CO2 26  --  34*  GLUCOSE 110*  --  85  BUN 13  --  18  CREATININE 1.60*  --  1.74*  CALCIUM 8.4*  --  8.9  MG  --  1.9  --    GFR: Estimated Creatinine Clearance: 52.8 mL/min (A) (by C-G formula based on SCr of 1.74 mg/dL (H)). Liver Function Tests: Recent Labs  Lab 04/04/20 1713  AST 31  ALT 25  ALKPHOS 104  BILITOT  2.8*  PROT 7.3  ALBUMIN 3.8   No results for input(s): LIPASE, AMYLASE in the last 168 hours. No results for input(s): AMMONIA in the last 168 hours. Coagulation Profile: Recent Labs  Lab 04/04/20 1646  INR 1.4*   Cardiac Enzymes: No results for input(s): CKTOTAL, CKMB, CKMBINDEX, TROPONINI in the last 168 hours. BNP (last 3 results) No results for input(s): PROBNP in the last 8760 hours. HbA1C: No results for input(s): HGBA1C in the last 72 hours. CBG: No results for input(s): GLUCAP in the last 168 hours. Lipid Profile: No results for input(s): CHOL, HDL, LDLCALC, TRIG, CHOLHDL, LDLDIRECT in the last 72 hours. Thyroid Function Tests:  No results for input(s): TSH, T4TOTAL, FREET4, T3FREE, THYROIDAB in the last 72 hours. Anemia Panel: No results for input(s): VITAMINB12, FOLATE, FERRITIN, TIBC, IRON, RETICCTPCT in the last 72 hours. Sepsis Labs: No results for input(s): PROCALCITON, LATICACIDVEN in the last 168 hours.  Recent Results (from the past 240 hour(s))  Resp Panel by RT PCR (RSV, Flu A&B, Covid) - Nasopharyngeal Swab     Status: None   Collection Time: 04/04/20  4:48 PM   Specimen: Nasopharyngeal Swab  Result Value Ref Range Status   SARS Coronavirus 2 by RT PCR NEGATIVE NEGATIVE Final    Comment: (NOTE) SARS-CoV-2 target nucleic acids are NOT DETECTED.  The SARS-CoV-2 RNA is generally detectable in upper respiratoy specimens during the acute phase of infection. The lowest concentration of SARS-CoV-2 viral copies this assay can detect is 131 copies/mL. A negative result does not preclude SARS-Cov-2 infection and should not be used as the sole basis for treatment or other patient management decisions. A negative result may occur with  improper specimen collection/handling, submission of specimen other than nasopharyngeal swab, presence of viral mutation(s) within the areas targeted by this assay, and inadequate number of viral copies (<131 copies/mL). A negative  result must be combined with clinical observations, patient history, and epidemiological information. The expected result is Negative.  Fact Sheet for Patients:  https://www.moore.com/  Fact Sheet for Healthcare Providers:  https://www.young.biz/  This test is no t yet approved or cleared by the [place] FDA and  has been authorized for detection and/or diagnosis of SARS-CoV-2 by FDA under an Emergency Use Authorization (EUA). This EUA will remain  in effect (meaning this test can be used) for the duration of the COVID-19 declaration under Section 564(b)(1) of the Act, 21 U.S.C. section 360bbb-3(b)(1), unless the authorization is terminated or revoked sooner.     Influenza A by PCR NEGATIVE NEGATIVE Final   Influenza B by PCR NEGATIVE NEGATIVE Final    Comment: (NOTE) The Xpert Xpress SARS-CoV-2/FLU/RSV assay is intended as an aid in  the diagnosis of influenza from Nasopharyngeal swab specimens and  should not be used as a sole basis for treatment. Nasal washings and  aspirates are unacceptable for Xpert Xpress SARS-CoV-2/FLU/RSV  testing.  Fact Sheet for Patients: https://www.moore.com/  Fact Sheet for Healthcare Providers: https://www.young.biz/  This test is not yet approved or cleared by the [place] FDA and  has been authorized for detection and/or diagnosis of SARS-CoV-2 by  FDA under an Emergency Use Authorization (EUA). This EUA will remain  in effect (meaning this test can be used) for the duration of the  Covid-19 declaration under Section 564(b)(1) of the Act, 21  U.S.C. section 360bbb-3(b)(1), unless the authorization is  terminated or revoked.    Respiratory Syncytial Virus by PCR NEGATIVE NEGATIVE Final    Comment: (NOTE) Fact Sheet for Patients: https://www.moore.com/  Fact Sheet for Healthcare  Providers: https://www.young.biz/  This test is not yet approved or cleared by the [place] FDA and  has been authorized for detection and/or diagnosis of SARS-CoV-2 by  FDA under an Emergency Use Authorization (EUA). This EUA will remain  in effect (meaning this test can be used) for the duration of the  COVID-19 declaration under Section 564(b)(1) of the Act, 21 U.S.C.  section 360bbb-3(b)(1), unless the authorization is terminated or  revoked. Performed at [facility], [address] [place], [place] [address]       Imaging Studies   DG Chest 2 View  Result Date:  04/04/2020 CLINICAL DATA:  Shortness of breath EXAM: CHEST - 2 VIEW COMPARISON:  12/03/2019 FINDINGS: Cardiomegaly. Peribronchial thickening and interstitial prominence, similar to prior study. No effusions. No acute bony abnormality. IMPRESSION: Stable interstitial prominence throughout the lungs could reflect interstitial edema or chronic interstitial lung disease. Electronically Signed   By: Charlett Nose M.D.   On: 04/04/2020 17:06   CT Angio Chest PE W and/or Wo Contrast  Result Date: 04/04/2020 CLINICAL DATA:  59 year old male with shortness of breath and positive D-dimer. Concern for pulmonary embolism. EXAM: CT ANGIOGRAPHY CHEST WITH CONTRAST TECHNIQUE: Multidetector CT imaging of the chest was performed using the standard protocol during bolus administration of intravenous contrast. Multiplanar CT image reconstructions and MIPs were obtained to evaluate the vascular anatomy. CONTRAST:  OMNIPAQUE IOHEXOL 350 MG/ML SOLN COMPARISON:  Chest CT dated 12/04/2019 and radiograph dated 04/04/2020 FINDINGS: Cardiovascular: There is mild cardiomegaly. No pericardial effusion. There is retrograde flow of contrast from the right atrium into the IVC suggestive of a degree of right heart dysfunction. Three vessel coronary vascular calcification. There is mild atherosclerotic calcification of the  thoracic aorta. No pulmonary artery embolus identified. Mediastinum/Nodes: Mildly enlarged hilar and mediastinal lymph nodes measures 15 mm in the right hilum, 12 mm in the subcarinal region, 12 mm in the right paratracheal region and 9 mm in short axis in the prevascular space. These are relatively similar to prior CT. The esophagus and the thyroid gland are grossly unremarkable. No mediastinal fluid collection. Lungs/Pleura: Trace right pleural effusion. There is diffuse interstitial prominence and hazy airspace density throughout the lungs concerning for edema. Pneumonia is less likely but not excluded. Clinical correlation is recommended. There is no focal consolidation or pneumothorax. The central airways are patent. Upper Abdomen: Trace perihepatic free fluid. There is minimal irregularity of the liver contour which may represent early changes of cirrhosis. Clinical correlation is recommended. Musculoskeletal: No chest wall abnormality. No acute or significant osseous findings. Review of the MIP images confirms the above findings. IMPRESSION: 1. No CT evidence of pulmonary embolism. 2. Cardiomegaly with findings of CHF and a trace right pleural effusion. 3. Mildly enlarged hilar and mediastinal lymph nodes, similar to prior CT, likely reactive. 4. Trace perihepatic free fluid. 5. Aortic Atherosclerosis (ICD10-I70.0). Electronically Signed   By: Elgie Collard M.D.   On: 04/04/2020 19:43   ECHOCARDIOGRAM COMPLETE  Result Date: 04/05/2020    ECHOCARDIOGRAM REPORT   Patient Name:   SHAHMEER BUNN Date of Exam: 04/05/2020 Medical Rec #:  [id number]      Height:       69.0 in Accession #:    [id number]     Weight:       234.1 lb Date of Birth:  August 11, 1960     BSA:          2.209 m Patient Age:    58 years       BP:           131/97 mmHg Patient Gender: M              HR:           85 bpm. Exam Location:  [facility] Procedure: 2D Echo, Cardiac Doppler and Color Doppler Indications:     CHF- acute systolic 428.21   History:         Patient has prior history of Echocardiogram examinations, most                  recent 12/04/2019.  Risk Factors:Hypertension.  Sonographer:     Cristela Blue RDCS (AE) Referring Phys:  [id number] Mary Sella ECKSTAT Diagnosing Phys: Julien Nordmann MD IMPRESSIONS  1. Left ventricular ejection fraction, by estimation, is 40 to 45%. The left ventricle has mildly decreased function. Hypokinesis of basal regions.There is moderate left ventricular hypertrophy. Left ventricular diastolic parameters are indeterminate.  2. Right ventricular systolic function is mildly reduced. The right ventricular size is mildly enlarged. There is mildly elevated pulmonary artery systolic pressure.  3. Left atrial size was severely dilated.  4. Right atrial size was moderately dilated. FINDINGS  Left Ventricle: Left ventricular ejection fraction, by estimation, is 40 to 45%. The left ventricle has mildly decreased function. The left ventricle has no regional wall motion abnormalities. The left ventricular internal cavity size was normal in size. There is moderate left ventricular hypertrophy. Left ventricular diastolic parameters are indeterminate. Right Ventricle: The right ventricular size is mildly enlarged. No increase in right ventricular wall thickness. Right ventricular systolic function is mildly reduced. There is mildly elevated pulmonary artery systolic pressure. The tricuspid regurgitant  velocity is 2.62 m/s, and with an assumed right atrial pressure of 10 mmHg, the estimated right ventricular systolic pressure is 37.5 mmHg. Left Atrium: Left atrial size was severely dilated. Right Atrium: Right atrial size was moderately dilated. Pericardium: There is no evidence of pericardial effusion. Mitral Valve: The mitral valve is normal in structure. Normal mobility of the mitral valve leaflets. No evidence of mitral valve regurgitation. No evidence of mitral valve stenosis. Tricuspid Valve: The tricuspid valve is normal in  structure. Tricuspid valve regurgitation is mild . No evidence of tricuspid stenosis. Aortic Valve: The aortic valve is normal in structure. Aortic valve regurgitation is not visualized. No aortic stenosis is present. Aortic valve mean gradient measures 3.3 mmHg. Aortic valve peak gradient measures 6.0 mmHg. Aortic valve area, by VTI measures 1.74 cm. Pulmonic Valve: The pulmonic valve was normal in structure. Pulmonic valve regurgitation is not visualized. No evidence of pulmonic stenosis. Aorta: The aortic root is normal in size and structure. Venous: The inferior vena cava is normal in size with greater than 50% respiratory variability, suggesting right atrial pressure of 3 mmHg. IAS/Shunts: No atrial level shunt detected by color flow Doppler.  LEFT VENTRICLE PLAX 2D LVIDd:         5.13 cm LVIDs:         3.71 cm LV PW:         1.63 cm LV IVS:        1.30 cm LVOT diam:     2.00 cm LV SV:         33 LV SV Index:   15 LVOT Area:     3.14 cm  RIGHT VENTRICLE RV Basal diam:  4.46 cm RV S prime:     8.38 cm/s TAPSE (M-mode): 4.1 cm LEFT ATRIUM              Index       RIGHT ATRIUM           Index LA diam:        4.90 cm  2.22 cm/m  RA Area:     31.50 cm LA Vol (A2C):   95.5 ml  43.23 ml/m RA Volume:   124.00 ml 56.13 ml/m LA Vol (A4C):   143.0 ml 64.74 ml/m LA Biplane Vol: 125.0 ml 56.59 ml/m  AORTIC VALVE  PULMONIC VALVE AV Area (Vmax):    1.78 cm    PV Vmax:        0.68 m/s AV Area (Vmean):   1.67 cm    PV Peak grad:   1.9 mmHg AV Area (VTI):     1.74 cm    RVOT Peak grad: 2 mmHg AV Vmax:           122.67 cm/s AV Vmean:          85.367 cm/s AV VTI:            0.188 m AV Peak Grad:      6.0 mmHg AV Mean Grad:      3.3 mmHg LVOT Vmax:         69.50 cm/s LVOT Vmean:        45.500 cm/s LVOT VTI:          0.104 m LVOT/AV VTI ratio: 0.55  AORTA Ao Root diam: 3.20 cm MITRAL VALVE               TRICUSPID VALVE MV Area (PHT): 4.39 cm    TR Peak grad:   27.5 mmHg MV Decel Time: 173 msec    TR  Vmax:        262.00 cm/s MV E velocity: 98.00 cm/s                            SHUNTS                            Systemic VTI:  0.10 m                            Systemic Diam: 2.00 cm Julien Nordmann MD Electronically signed by Julien Nordmann MD Signature Date/Time: 04/05/2020/6:49:44 PM    Final    US Abdomen Limited RUQ  Result Date: 04/04/2020 CLINICAL DATA:  Right upper quadrant pain for 2 days EXAM: ULTRASOUND ABDOMEN LIMITED RIGHT UPPER QUADRANT COMPARISON:  12/04/2019 FINDINGS: Gallbladder: Gallbladder is predominately decompressed although the wall is significantly thickened 8 mm. Negative sonographic Murphy's sign is noted however. No stones are seen. Common bile duct: Diameter: 3 mm Liver: No focal lesion identified. Within normal limits in parenchymal echogenicity. Portal vein is patent on color Doppler imaging with normal direction of blood flow towards the liver. Other: None. IMPRESSION: Gallbladder wall thickening in part likely related to decompression. Negative sonographic Murphy's sign is noted. These changes could represent some chronic cholecystitis. No other focal abnormality is noted. Electronically Signed   By: Alcide Clever M.D.   On: 04/04/2020 19:00     Medications   Scheduled Meds: . apixaban  5 mg Oral BID  . aspirin EC  81 mg Oral Daily  . atorvastatin  80 mg Oral Daily  . carvedilol  25 mg Oral BID WC  . furosemide  60 mg Intravenous BID  . hydrALAZINE  50 mg Oral Q8H  . isosorbide mononitrate  15 mg Oral Daily  . losartan  25 mg Oral Daily  . sodium chloride flush  3 mL Intravenous Q12H   Continuous Infusions: . sodium chloride         LOS: 1 day    Time spent: 25 minutes    Pennie Banter, DO [facility]  04/06/2020, 8:27 AM    If 7PM-7AM, please contact night-coverage. How  to contact the [facility] Attending or Consulting provider 7A - 7P or covering provider during after hours 7P -7A, for this patient?    1. Check the care team in [facility] and look  for a) attending/consulting [facility] provider listed and b) the [facility] team listed 2. Log into [email or web address] and use [place]'s universal password to access. If you do not have the password, please contact the hospital operator. 3. Locate the [facility] provider you are looking for under [facility] and page to a number that you can be directly reached. 4. If you still have difficulty reaching the provider, please page the [facility] (Director on Call) for the Hospitalists listed on amion for assistance.

## 2020-04-06 NOTE — Progress Notes (Signed)
Progress Note  Patient Name: Gregory Howell Date of Encounter: 04/06/2020  [facility] Cardiologist: [facility], [place] [facility]  Subjective   Reports feeling better though not at his baseline Abdomen less distended, less tight, still distended above his baseline. Reports having severe cramping, improved with muscle relaxer medication   Inpatient Medications    Scheduled Meds: . apixaban  5 mg Oral BID  . aspirin EC  81 mg Oral Daily  . atorvastatin  80 mg Oral Daily  . carvedilol  25 mg Oral BID WC  . furosemide  60 mg Intravenous BID  . hydrALAZINE  50 mg Oral Q8H  . isosorbide mononitrate  15 mg Oral Daily  . losartan  25 mg Oral Daily  . potassium chloride  40 mEq Oral Once  . sodium chloride flush  3 mL Intravenous Q12H   Continuous Infusions: . sodium chloride     PRN Meds: sodium chloride, acetaminophen, methocarbamol, ondansetron (ZOFRAN) IV, sodium chloride flush   Vital Signs    Vitals:   04/06/20 0518 04/06/20 0543 04/06/20 0740 04/06/20 1129  BP: (!) 146/105 127/81 (!) 146/86 (!) 108/96  Pulse: 72 81 63 73  Resp: 16 19 18 19   Temp: (!) 97.5 F (36.4 C) 98 F (36.7 C) 97.8 F (36.6 C) 97.8 F (36.6 C)  TempSrc: Oral Oral Oral   SpO2: 97% 96% 99% 98%  Weight:  100.8 kg    Height:        Intake/Output Summary (Last 24 hours) at 04/06/2020 1257 Last data filed at 04/06/2020 1129 Gross per 24 hour  Intake 480 ml  Output 3350 ml  Net -2870 ml   Last 3 Weights 04/06/2020 04/05/2020 04/05/2020  Weight (lbs) 222 lb 4.8 oz 221 lb 234 lb 1.6 oz  Weight (kg) 100.835 kg 100.245 kg 106.187 kg      Telemetry    Atrial fibrillation- Personally Reviewed  ECG     - Personally Reviewed  Physical Exam   GEN: No acute distress.   Neck: JVD 8+ Cardiac:  Irregularly irregular, no murmurs, rubs, or gallops.  Respiratory: Clear to auscultation bilaterally.  Scattered Rales GI: Soft, nontender,  mildly distended distended  MS: No edema; No  deformity. Neuro:  Nonfocal  Psych: Normal affect   Labs    High Sensitivity Troponin:   Recent Labs  Lab 04/04/20 1646 04/04/20 1854  TROPONINIHS 44* 44*      Chemistry Recent Labs  Lab 04/04/20 1646 04/04/20 1713 04/05/20 1557 04/06/20 0637  NA 137  --  138 135  K 4.2  --  4.2 3.5  CL 102  --  94* 100  CO2 26  --  34* 26  GLUCOSE 110*  --  85 99  BUN 13  --  18 22*  CREATININE 1.60*  --  1.74* 1.77*  CALCIUM 8.4*  --  8.9 8.6*  PROT  --  7.3  --   --   ALBUMIN  --  3.8  --   --   AST  --  31  --   --   ALT  --  25  --   --   ALKPHOS  --  104  --   --   BILITOT  --  2.8*  --   --   GFRNONAA 46*  --  42* 41*  GFRAA 53*  --  48* 47*  ANIONGAP 9  --  10 9     Hematology Recent Labs  Lab 04/04/20  1646 04/04/20 1713 04/06/20 0637  WBC 11.5* 11.8* 10.4  RBC 4.74 4.71 4.79  HGB 14.5 14.2 14.5  HCT 40.2 39.0 40.9  MCV 84.8 82.8 85.4  MCH 30.6 30.1 30.3  MCHC 36.1* 36.4* 35.5  RDW 17.4* 17.4* 17.5*  PLT 229 231 239    BNP Recent Labs  Lab 04/04/20 1713  BNP 694.3*     DDimer No results for input(s): DDIMER in the last 168 hours.   Radiology    DG Chest 2 View  Result Date: 04/04/2020 CLINICAL DATA:  Shortness of breath EXAM: CHEST - 2 VIEW COMPARISON:  12/03/2019 FINDINGS: Cardiomegaly. Peribronchial thickening and interstitial prominence, similar to prior study. No effusions. No acute bony abnormality. IMPRESSION: Stable interstitial prominence throughout the lungs could reflect interstitial edema or chronic interstitial lung disease. Electronically Signed   By: Charlett Nose M.D.   On: 04/04/2020 17:06   CT Angio Chest PE W and/or Wo Contrast  Result Date: 04/04/2020 CLINICAL DATA:  59 year old male with shortness of breath and positive D-dimer. Concern for pulmonary embolism. EXAM: CT ANGIOGRAPHY CHEST WITH CONTRAST TECHNIQUE: Multidetector CT imaging of the chest was performed using the standard protocol during bolus administration of intravenous  contrast. Multiplanar CT image reconstructions and MIPs were obtained to evaluate the vascular anatomy. CONTRAST:  OMNIPAQUE IOHEXOL 350 MG/ML SOLN COMPARISON:  Chest CT dated 12/04/2019 and radiograph dated 04/04/2020 FINDINGS: Cardiovascular: There is mild cardiomegaly. No pericardial effusion. There is retrograde flow of contrast from the right atrium into the IVC suggestive of a degree of right heart dysfunction. Three vessel coronary vascular calcification. There is mild atherosclerotic calcification of the thoracic aorta. No pulmonary artery embolus identified. Mediastinum/Nodes: Mildly enlarged hilar and mediastinal lymph nodes measures 15 mm in the right hilum, 12 mm in the subcarinal region, 12 mm in the right paratracheal region and 9 mm in short axis in the prevascular space. These are relatively similar to prior CT. The esophagus and the thyroid gland are grossly unremarkable. No mediastinal fluid collection. Lungs/Pleura: Trace right pleural effusion. There is diffuse interstitial prominence and hazy airspace density throughout the lungs concerning for edema. Pneumonia is less likely but not excluded. Clinical correlation is recommended. There is no focal consolidation or pneumothorax. The central airways are patent. Upper Abdomen: Trace perihepatic free fluid. There is minimal irregularity of the liver contour which may represent early changes of cirrhosis. Clinical correlation is recommended. Musculoskeletal: No chest wall abnormality. No acute or significant osseous findings. Review of the MIP images confirms the above findings. IMPRESSION: 1. No CT evidence of pulmonary embolism. 2. Cardiomegaly with findings of CHF and a trace right pleural effusion. 3. Mildly enlarged hilar and mediastinal lymph nodes, similar to prior CT, likely reactive. 4. Trace perihepatic free fluid. 5. Aortic Atherosclerosis (ICD10-I70.0). Electronically Signed   By: Elgie Collard M.D.   On: 04/04/2020 19:43    ECHOCARDIOGRAM COMPLETE  Result Date: 04/05/2020    ECHOCARDIOGRAM REPORT   Patient Name:   Gregory Howell Date of Exam: 04/05/2020 Medical Rec #:  [id number]      Height:       69.0 in Accession #:    [id number]     Weight:       234.1 lb Date of Birth:  28-Apr-1961     BSA:          2.209 m Patient Age:    58 years       BP:  131/97 mmHg Patient Gender: M              HR:           85 bpm. Exam Location:  [facility] Procedure: 2D Echo, Cardiac Doppler and Color Doppler Indications:     CHF- acute systolic 428.21  History:         Patient has prior history of Echocardiogram examinations, most                  recent 12/04/2019. Risk Factors:Hypertension.  Sonographer:     Cristela Blue RDCS (AE) Referring Phys:  [id number] Mary Sella ECKSTAT Diagnosing Phys: Julien Nordmann MD IMPRESSIONS  1. Left ventricular ejection fraction, by estimation, is 40 to 45%. The left ventricle has mildly decreased function. Hypokinesis of basal regions.There is moderate left ventricular hypertrophy. Left ventricular diastolic parameters are indeterminate.  2. Right ventricular systolic function is mildly reduced. The right ventricular size is mildly enlarged. There is mildly elevated pulmonary artery systolic pressure.  3. Left atrial size was severely dilated.  4. Right atrial size was moderately dilated. FINDINGS  Left Ventricle: Left ventricular ejection fraction, by estimation, is 40 to 45%. The left ventricle has mildly decreased function. The left ventricle has no regional wall motion abnormalities. The left ventricular internal cavity size was normal in size. There is moderate left ventricular hypertrophy. Left ventricular diastolic parameters are indeterminate. Right Ventricle: The right ventricular size is mildly enlarged. No increase in right ventricular wall thickness. Right ventricular systolic function is mildly reduced. There is mildly elevated pulmonary artery systolic pressure. The tricuspid regurgitant  velocity is  2.62 m/s, and with an assumed right atrial pressure of 10 mmHg, the estimated right ventricular systolic pressure is 37.5 mmHg. Left Atrium: Left atrial size was severely dilated. Right Atrium: Right atrial size was moderately dilated. Pericardium: There is no evidence of pericardial effusion. Mitral Valve: The mitral valve is normal in structure. Normal mobility of the mitral valve leaflets. No evidence of mitral valve regurgitation. No evidence of mitral valve stenosis. Tricuspid Valve: The tricuspid valve is normal in structure. Tricuspid valve regurgitation is mild . No evidence of tricuspid stenosis. Aortic Valve: The aortic valve is normal in structure. Aortic valve regurgitation is not visualized. No aortic stenosis is present. Aortic valve mean gradient measures 3.3 mmHg. Aortic valve peak gradient measures 6.0 mmHg. Aortic valve area, by VTI measures 1.74 cm. Pulmonic Valve: The pulmonic valve was normal in structure. Pulmonic valve regurgitation is not visualized. No evidence of pulmonic stenosis. Aorta: The aortic root is normal in size and structure. Venous: The inferior vena cava is normal in size with greater than 50% respiratory variability, suggesting right atrial pressure of 3 mmHg. IAS/Shunts: No atrial level shunt detected by color flow Doppler.  LEFT VENTRICLE PLAX 2D LVIDd:         5.13 cm LVIDs:         3.71 cm LV PW:         1.63 cm LV IVS:        1.30 cm LVOT diam:     2.00 cm LV SV:         33 LV SV Index:   15 LVOT Area:     3.14 cm  RIGHT VENTRICLE RV Basal diam:  4.46 cm RV S prime:     8.38 cm/s TAPSE (M-mode): 4.1 cm LEFT ATRIUM              Index  RIGHT ATRIUM           Index LA diam:        4.90 cm  2.22 cm/m  RA Area:     31.50 cm LA Vol (A2C):   95.5 ml  43.23 ml/m RA Volume:   124.00 ml 56.13 ml/m LA Vol (A4C):   143.0 ml 64.74 ml/m LA Biplane Vol: 125.0 ml 56.59 ml/m  AORTIC VALVE                   PULMONIC VALVE AV Area (Vmax):    1.78 cm    PV Vmax:        0.68 m/s  AV Area (Vmean):   1.67 cm    PV Peak grad:   1.9 mmHg AV Area (VTI):     1.74 cm    RVOT Peak grad: 2 mmHg AV Vmax:           122.67 cm/s AV Vmean:          85.367 cm/s AV VTI:            0.188 m AV Peak Grad:      6.0 mmHg AV Mean Grad:      3.3 mmHg LVOT Vmax:         69.50 cm/s LVOT Vmean:        45.500 cm/s LVOT VTI:          0.104 m LVOT/AV VTI ratio: 0.55  AORTA Ao Root diam: 3.20 cm MITRAL VALVE               TRICUSPID VALVE MV Area (PHT): 4.39 cm    TR Peak grad:   27.5 mmHg MV Decel Time: 173 msec    TR Vmax:        262.00 cm/s MV E velocity: 98.00 cm/s                            SHUNTS                            Systemic VTI:  0.10 m                            Systemic Diam: 2.00 cm Julien Nordmann MD Electronically signed by Julien Nordmann MD Signature Date/Time: 04/05/2020/6:49:44 PM    Final    US Abdomen Limited RUQ  Result Date: 04/04/2020 CLINICAL DATA:  Right upper quadrant pain for 2 days EXAM: ULTRASOUND ABDOMEN LIMITED RIGHT UPPER QUADRANT COMPARISON:  12/04/2019 FINDINGS: Gallbladder: Gallbladder is predominately decompressed although the wall is significantly thickened 8 mm. Negative sonographic Murphy's sign is noted however. No stones are seen. Common bile duct: Diameter: 3 mm Liver: No focal lesion identified. Within normal limits in parenchymal echogenicity. Portal vein is patent on color Doppler imaging with normal direction of blood flow towards the liver. Other: None. IMPRESSION: Gallbladder wall thickening in part likely related to decompression. Negative sonographic Murphy's sign is noted. These changes could represent some chronic cholecystitis. No other focal abnormality is noted. Electronically Signed   By: Alcide Clever M.D.   On: 04/04/2020 19:00    Cardiac Studies   Echo Left ventricular ejection fraction, by estimation, is 40 to 45%. The  left ventricle has mildly decreased function. Hypokinesis of basal  regions.There is moderate left ventricular hypertrophy. Left  ventricular  diastolic  parameters are indeterminate.  2. Right ventricular systolic function is mildly reduced. The right  ventricular size is mildly enlarged. There is mildly elevated pulmonary  artery systolic pressure.  3. Left atrial size was severely dilated.  4. Right atrial size was moderately dilated.  Patient Profile     Mr. Gregory Howell is a 59 year old gentleman with history of nonischemic cardiomyopathy, systolic CHF, hypertension, prior alcohol abuse, presenting with worsening shortness of breath, ankle swelling, abdominal distention and chest pain  Assessment & Plan    Acute on chronic diastolic and systolic CHF Mildly depressed ejection fraction 40 to 45%,  moderate LVH  Known hypertensive heart disease with prior history of chronic diastolic and systolic CHF Presenting with worsening shortness of breath, abdominal distention Reports taking Lasix 40 mg daily baseline weight 205 per the patient, weight 220 on admission --Would continue IV Lasix twice daily with close monitoring of renal function   Hypertensive heart disease with CHF significant LVH on echocardiogram with diastolic dysfunction --Pressures improved on isosorbide, hydralazine, carvedilol with diuresis For hypotension may need to decrease dose of his hydralazine  Persistent atrial fibrillation, longstanding Dilated left atrium on echocardiogram and in the setting of LVH, diastolic dysfunction,  Has been asymptomatic from atrial fibrillation following discharge from the hospital April 2021 -No plan to restore normal sinus rhythm at this time, Plan to restore euvolemic state, then reassess symptoms, follow-up with [facility] --- On carvedilol for rate control, NOAC  Atypical chest pain Prior cardiac catheterization performed earlier this year, nonobstructive disease No plan for ischemic work-up  Cardiomyopathy Prior history alcohol, he has nonischemic cardiomyopathy Recommend cessation of any  alcohol   Total encounter time more than 25 minutes  Greater than 50% was spent in counseling and coordination of care with the patient     For questions or updates, please contact [facility] Please consult [email or web address] for contact info under        Signed, Julien Nordmann, MD  04/06/2020, 12:57 PM

## 2020-04-06 NOTE — Progress Notes (Signed)
Assumed care of the it. Condition stable no change in initial;  am assessment. Cont with plan of care

## 2020-04-07 LAB — BASIC METABOLIC PANEL
Anion gap: 14 (ref 5–15)
BUN: 21 mg/dL — ABNORMAL HIGH (ref 6–20)
CO2: 27 mmol/L (ref 22–32)
Calcium: 8.7 mg/dL — ABNORMAL LOW (ref 8.9–10.3)
Chloride: 95 mmol/L — ABNORMAL LOW (ref 98–111)
Creatinine, Ser: 1.41 mg/dL — ABNORMAL HIGH (ref 0.61–1.24)
GFR calc Af Amer: >60 mL/min (ref >60)
GFR calc non Af Amer: 54 mL/min — ABNORMAL LOW (ref >60)
Glucose, Bld: 76 mg/dL (ref 70–99)
Potassium: 3.2 mmol/L — ABNORMAL LOW (ref 3.5–5.1)
Sodium: 136 mmol/L (ref 135–145)

## 2020-04-07 LAB — MAGNESIUM: Magnesium: 2.2 mg/dL (ref 1.7–2.4)

## 2020-04-07 MED ORDER — LOSARTAN POTASSIUM 50 MG PO TABS
50.0000 mg | ORAL_TABLET | Freq: Every day | ORAL | 0 refills | Status: DC
Start: 2020-04-08 — End: 2020-06-23

## 2020-04-07 MED ORDER — LOSARTAN POTASSIUM 50 MG PO TABS: 50.0000 mg | ORAL_TABLET | Freq: Every day | ORAL | Status: DC

## 2020-04-07 MED ORDER — METHOCARBAMOL 500 MG PO TABS: 500.0000 mg | ORAL_TABLET | Freq: Three times a day (TID) | ORAL | 0 refills | Status: DC | PRN

## 2020-04-07 MED ORDER — FUROSEMIDE 40 MG PO TABS
40.0000 mg | ORAL_TABLET | Freq: Every day | ORAL | 0 refills | Status: DC
Start: 2020-04-07 — End: 2020-06-21

## 2020-04-07 MED ORDER — ISOSORBIDE MONONITRATE ER 30 MG PO TB24: 30.0000 mg | ORAL_TABLET | Freq: Every day | ORAL | Status: DC

## 2020-04-07 MED ORDER — ISOSORBIDE MONONITRATE ER 30 MG PO TB24
15.0000 mg | ORAL_TABLET | Freq: Once | ORAL | Status: AC
  Administered 2020-04-07: 15 mg via ORAL
  Filled 2020-04-07: qty 1

## 2020-04-07 MED ORDER — ISOSORBIDE MONONITRATE ER 30 MG PO TB24
30.0000 mg | ORAL_TABLET | Freq: Every day | ORAL | 0 refills | Status: DC
Start: 2020-04-08 — End: 2020-06-23

## 2020-04-07 MED ORDER — LOSARTAN POTASSIUM 25 MG PO TABS
25.0000 mg | ORAL_TABLET | Freq: Once | ORAL | Status: AC
  Administered 2020-04-07: 25 mg via ORAL
  Filled 2020-04-07: qty 1

## 2020-04-07 MED ORDER — POTASSIUM CHLORIDE CRYS ER 20 MEQ PO TBCR
40.0000 meq | EXTENDED_RELEASE_TABLET | ORAL | Status: AC
  Administered 2020-04-07 (×2): 40 meq via ORAL
  Filled 2020-04-07 (×2): qty 2

## 2020-04-07 MED ORDER — FUROSEMIDE 40 MG PO TABS
40.0000 mg | ORAL_TABLET | Freq: Every day | ORAL | Status: DC
  Administered 2020-04-07: 40 mg via ORAL
  Filled 2020-04-07: qty 1

## 2020-04-07 NOTE — Discharge Summary (Addendum)
Physician Discharge Summary  Gregory Howell KPT:[id number] DOB: 1961/05/28 DOA: 04/04/2020  PCP: [facility]  Admit date: 04/04/2020 Discharge date: 04/16/2020  Admitted From: home Disposition:  home  Recommendations for Outpatient Follow-up:  Follow up with PCP in 1-2 weeks Please obtain BMP/CBC in one week Please follow up closely with cardiology at the [facility], within 1-2 weeks  Home Health: No  Equipment/Devices: None   Discharge Condition: Stable  CODE STATUS: Full  Diet recommendation: Heart Healthy with fluid restriction   Discharge Diagnoses: Principal Problem:   Acute on chronic combined systolic and diastolic CHF (congestive heart failure) (HCC) Active Problems:   Nonobstructive atherosclerosis of coronary artery    Summary of HPI and Hospital Course:  Gregory Howell is a 59 y.o. male with history of HFrEF, hypertension, CAD, A. fib, who presented to the ED on 04/04/20 with progressive shortness of breath over the past several weeks.  Also endorsed orthopnea, nightly cough, mild lower extremity edema, and chest pains characterized as heart burn that resolved prior to presenting.    Recently admitted in April and diagnosed with HFrEF with non-ischemic cardiomyopathy new diagnosis of A-fib.  At that time, underwent left and right heart cath which showed global LV hypokinesis and nonobstructive coronary artery disease as well as moderate pulmonary hypertension.  Weight upon discharge was 205 lbs, pt reported recent weight at home was 220 lbs.  ED course: borderline tachycardia, tachypnea, mildly hypertensive, afebrile. Initial labs notable for Cr 1.6 (baseline appears around 1.3-1.5), troponin 44 x 2, BNP 694, normal CBC, D-Dimer up at 3569.  EKG similar to prior except for new T wave inversion in V5, no ST segment changes.  CTA chest negative for PE, did show cardiomegaly, findings of CHF and trace right pleural effusion. Treated with IV Lasix. Admitted to  hospitalist service with cardiology consulted for further management of decompensated HFrEF.   Echo on 8/27 showed reduced LV systolic function with EF 40-45%, basal hypokinesis, moderate LVH reflective of diastolic dysfunction, severely dilated left atrium, moderately dilated right atrium.    Patient clinically improved with IV diuresis.   He was educated on fluid intake restriction, and began charting his intake during admission.  His symptoms resolved, and he was ambulating the hallways without shortness of breath prior to discharge.    He was discharged on 40 mg oral Lasix daily, with BID dosing on M/W/F, and PRN dose for weight gain or becoming symptomatic with SOB.  Recommend close follow up with cardiology at the [facility].   Discharge Instructions   Discharge Instructions     (HEART FAILURE PATIENTS) Call MD:  Anytime you have any of the following symptoms: 1) 3 pound weight gain in 24 hours or 5 pounds in 1 week 2) shortness of breath, with or without a dry hacking cough 3) swelling in the hands, feet or stomach 4) if you have to sleep on extra pillows at night in order to breathe.   Complete by: As directed    Call MD for:  extreme fatigue   Complete by: As directed    Call MD for:  persistant dizziness or light-headedness   Complete by: As directed    Diet - low sodium heart healthy   Complete by: As directed    Increase activity slowly   Complete by: As directed       Allergies as of 04/07/2020   No Known Allergies      Medication List     TAKE these  medications    apixaban 5 MG Tabs tablet Commonly known as: ELIQUIS Take 1 tablet (5 mg total) by mouth 2 (two) times daily.   aspirin 81 MG EC tablet Take 1 tablet (81 mg total) by mouth daily.   atorvastatin 80 MG tablet Commonly known as: LIPITOR Take 1 tablet (80 mg total) by mouth daily.   carvedilol 25 MG tablet Commonly known as: COREG Take 1 tablet (25 mg total) by mouth 2 (two) times daily with a meal.    furosemide 40 MG tablet Commonly known as: LASIX Take 1 tablet (40 mg total) by mouth daily. On Monday, Wednesday, Friday, take 40 mg TWICE daily. What changed: additional instructions   hydrALAZINE 50 MG tablet Commonly known as: APRESOLINE Take 1 tablet (50 mg total) by mouth every 8 (eight) hours.   isosorbide mononitrate 30 MG 24 hr tablet Commonly known as: IMDUR Take 1 tablet (30 mg total) by mouth daily. What changed: how much to take   losartan 50 MG tablet Commonly known as: COZAAR Take 1 tablet (50 mg total) by mouth daily. What changed:  medication strength how much to take   methocarbamol 500 MG tablet Commonly known as: ROBAXIN Take 1 tablet (500 mg total) by mouth every 8 (eight) hours as needed for muscle spasms (cramping).        Follow-up Information     [facility] Follow up on 04/18/2020.   Specialty: Cardiology Why: at 9:00am. Enter through the Medical Mall entrance Contact information: [address] [place] [address] [phone]               No Known Allergies  Consultations: Cardiology, Dr. Mariah Milling    Procedures/Studies: DG Chest 2 View  Result Date: 04/04/2020 CLINICAL DATA:  Shortness of breath EXAM: CHEST - 2 VIEW COMPARISON:  12/03/2019 FINDINGS: Cardiomegaly. Peribronchial thickening and interstitial prominence, similar to prior study. No effusions. No acute bony abnormality. IMPRESSION: Stable interstitial prominence throughout the lungs could reflect interstitial edema or chronic interstitial lung disease. Electronically Signed   By: Charlett Nose M.D.   On: 04/04/2020 17:06   CT Angio Chest PE W and/or Wo Contrast  Result Date: 04/04/2020 CLINICAL DATA:  59 year old male with shortness of breath and positive D-dimer. Concern for pulmonary embolism. EXAM: CT ANGIOGRAPHY CHEST WITH CONTRAST TECHNIQUE: Multidetector CT imaging of the chest was performed using  the standard protocol during bolus administration of intravenous contrast. Multiplanar CT image reconstructions and MIPs were obtained to evaluate the vascular anatomy. CONTRAST:  OMNIPAQUE IOHEXOL 350 MG/ML SOLN COMPARISON:  Chest CT dated 12/04/2019 and radiograph dated 04/04/2020 FINDINGS: Cardiovascular: There is mild cardiomegaly. No pericardial effusion. There is retrograde flow of contrast from the right atrium into the IVC suggestive of a degree of right heart dysfunction. Three vessel coronary vascular calcification. There is mild atherosclerotic calcification of the thoracic aorta. No pulmonary artery embolus identified. Mediastinum/Nodes: Mildly enlarged hilar and mediastinal lymph nodes measures 15 mm in the right hilum, 12 mm in the subcarinal region, 12 mm in the right paratracheal region and 9 mm in short axis in the prevascular space. These are relatively similar to prior CT. The esophagus and the thyroid gland are grossly unremarkable. No mediastinal fluid collection. Lungs/Pleura: Trace right pleural effusion. There is diffuse interstitial prominence and hazy airspace density throughout the lungs concerning for edema. Pneumonia is less likely but not excluded. Clinical correlation is recommended. There is no focal consolidation or  pneumothorax. The central airways are patent. Upper Abdomen: Trace perihepatic free fluid. There is minimal irregularity of the liver contour which may represent early changes of cirrhosis. Clinical correlation is recommended. Musculoskeletal: No chest wall abnormality. No acute or significant osseous findings. Review of the MIP images confirms the above findings. IMPRESSION: 1. No CT evidence of pulmonary embolism. 2. Cardiomegaly with findings of CHF and a trace right pleural effusion. 3. Mildly enlarged hilar and mediastinal lymph nodes, similar to prior CT, likely reactive. 4. Trace perihepatic free fluid. 5. Aortic Atherosclerosis (ICD10-I70.0). Electronically  Signed   By: Elgie Collard M.D.   On: 04/04/2020 19:43   ECHOCARDIOGRAM COMPLETE  Result Date: 04/05/2020    ECHOCARDIOGRAM REPORT   Patient Name:   JERAULD BOSTWICK Date of Exam: 04/05/2020 Medical Rec #:  [id number]      Height:       69.0 in Accession #:    [id number]     Weight:       234.1 lb Date of Birth:  [birth year]     BSA:          2.209 m Patient Age:    58 years       BP:           131/97 mmHg Patient Gender: M              HR:           85 bpm. Exam Location:  [facility] Procedure: 2D Echo, Cardiac Doppler and Color Doppler Indications:     CHF- acute systolic 428.21  History:         Patient has prior history of Echocardiogram examinations, most                  recent 12/04/2019. Risk Factors:Hypertension.  Sonographer:     Cristela Blue RDCS (AE) Referring Phys:  [id number] Mary Sella ECKSTAT Diagnosing Phys: Julien Nordmann MD IMPRESSIONS  1. Left ventricular ejection fraction, by estimation, is 40 to 45%. The left ventricle has mildly decreased function. Hypokinesis of basal regions.There is moderate left ventricular hypertrophy. Left ventricular diastolic parameters are indeterminate.  2. Right ventricular systolic function is mildly reduced. The right ventricular size is mildly enlarged. There is mildly elevated pulmonary artery systolic pressure.  3. Left atrial size was severely dilated.  4. Right atrial size was moderately dilated. FINDINGS  Left Ventricle: Left ventricular ejection fraction, by estimation, is 40 to 45%. The left ventricle has mildly decreased function. The left ventricle has no regional wall motion abnormalities. The left ventricular internal cavity size was normal in size. There is moderate left ventricular hypertrophy. Left ventricular diastolic parameters are indeterminate. Right Ventricle: The right ventricular size is mildly enlarged. No increase in right ventricular wall thickness. Right ventricular systolic function is mildly reduced. There is mildly elevated pulmonary artery  systolic pressure. The tricuspid regurgitant  velocity is 2.62 m/s, and with an assumed right atrial pressure of 10 mmHg, the estimated right ventricular systolic pressure is 37.5 mmHg. Left Atrium: Left atrial size was severely dilated. Right Atrium: Right atrial size was moderately dilated. Pericardium: There is no evidence of pericardial effusion. Mitral Valve: The mitral valve is normal in structure. Normal mobility of the mitral valve leaflets. No evidence of mitral valve regurgitation. No evidence of mitral valve stenosis. Tricuspid Valve: The tricuspid valve is normal in structure. Tricuspid valve regurgitation is mild . No evidence of tricuspid stenosis. Aortic Valve: The aortic valve is normal in structure. Aortic valve  regurgitation is not visualized. No aortic stenosis is present. Aortic valve mean gradient measures 3.3 mmHg. Aortic valve peak gradient measures 6.0 mmHg. Aortic valve area, by VTI measures 1.74 cm. Pulmonic Valve: The pulmonic valve was normal in structure. Pulmonic valve regurgitation is not visualized. No evidence of pulmonic stenosis. Aorta: The aortic root is normal in size and structure. Venous: The inferior vena cava is normal in size with greater than 50% respiratory variability, suggesting right atrial pressure of 3 mmHg. IAS/Shunts: No atrial level shunt detected by color flow Doppler.  LEFT VENTRICLE PLAX 2D LVIDd:         5.13 cm LVIDs:         3.71 cm LV PW:         1.63 cm LV IVS:        1.30 cm LVOT diam:     2.00 cm LV SV:         33 LV SV Index:   15 LVOT Area:     3.14 cm  RIGHT VENTRICLE RV Basal diam:  4.46 cm RV S prime:     8.38 cm/s TAPSE (M-mode): 4.1 cm LEFT ATRIUM              Index       RIGHT ATRIUM           Index LA diam:        4.90 cm  2.22 cm/m  RA Area:     31.50 cm LA Vol (A2C):   95.5 ml  43.23 ml/m RA Volume:   124.00 ml 56.13 ml/m LA Vol (A4C):   143.0 ml 64.74 ml/m LA Biplane Vol: 125.0 ml 56.59 ml/m  AORTIC VALVE                   PULMONIC  VALVE AV Area (Vmax):    1.78 cm    PV Vmax:        0.68 m/s AV Area (Vmean):   1.67 cm    PV Peak grad:   1.9 mmHg AV Area (VTI):     1.74 cm    RVOT Peak grad: 2 mmHg AV Vmax:           122.67 cm/s AV Vmean:          85.367 cm/s AV VTI:            0.188 m AV Peak Grad:      6.0 mmHg AV Mean Grad:      3.3 mmHg LVOT Vmax:         69.50 cm/s LVOT Vmean:        45.500 cm/s LVOT VTI:          0.104 m LVOT/AV VTI ratio: 0.55  AORTA Ao Root diam: 3.20 cm MITRAL VALVE               TRICUSPID VALVE MV Area (PHT): 4.39 cm    TR Peak grad:   27.5 mmHg MV Decel Time: 173 msec    TR Vmax:        262.00 cm/s MV E velocity: 98.00 cm/s                            SHUNTS                            Systemic VTI:  0.10 m  Systemic Diam: 2.00 cm Julien Nordmann MD Electronically signed by Julien Nordmann MD Signature Date/Time: 04/05/2020/6:49:44 PM    Final    US Abdomen Limited RUQ  Result Date: 04/04/2020 CLINICAL DATA:  Right upper quadrant pain for 2 days EXAM: ULTRASOUND ABDOMEN LIMITED RIGHT UPPER QUADRANT COMPARISON:  12/04/2019 FINDINGS: Gallbladder: Gallbladder is predominately decompressed although the wall is significantly thickened 8 mm. Negative sonographic Murphy's sign is noted however. No stones are seen. Common bile duct: Diameter: 3 mm Liver: No focal lesion identified. Within normal limits in parenchymal echogenicity. Portal vein is patent on color Doppler imaging with normal direction of blood flow towards the liver. Other: None. IMPRESSION: Gallbladder wall thickening in part likely related to decompression. Negative sonographic Murphy's sign is noted. These changes could represent some chronic cholecystitis. No other focal abnormality is noted. Electronically Signed   By: Alcide Clever M.D.   On: 04/04/2020 19:00       Subjective: Patient seen walking halls this morning, and up in chair on rounds.  Says he is feeling much better.  Shows me his log of fluid intake and seems  quite motivated to follow fluid restriction as recommended.  Muscle cramps better with Robaxin.  No other complaints.    Discharge Exam: Vitals:   04/07/20 0800 04/07/20 1152  BP: (!) 114/100 (!) 144/74  Pulse: 86 67  Resp: 19 19  Temp: 97.9 F (36.6 C) (!) 97.5 F (36.4 C)  SpO2: 97% 96%   Vitals:   04/07/20 0600 04/07/20 0601 04/07/20 0800 04/07/20 1152  BP: (!) 158/112 (!) 141/108 (!) 114/100 (!) 144/74  Pulse: 81 (!) 59 86 67  Resp: Temp: 98.1 F (36.7 C)  97.9 F (36.6 C) (!) 97.5 F (36.4 C)  TempSrc: Oral  Oral Oral  SpO2: 98%  97% 96%  Weight: 99.4 kg     Height:        General: Pt is alert, awake, not in acute distress Cardiovascular: RRR, S1/S2 +, no rubs, no gallops Respiratory: CTA bilaterally, no wheezing, no rhonchi Abdominal: Soft, NT, ND, bowel sounds + Extremities: no edema, no cyanosis    The results of significant diagnostics from this hospitalization (including imaging, microbiology, ancillary and laboratory) are listed below for reference.     Microbiology: No results found for this or any previous visit (from the past 240 hour(s)).    Labs: BNP (last 3 results) Recent Labs    12/03/19 2152 04/04/20 1713  BNP 660.0* 694.3*   Basic Metabolic Panel: No results for input(s): NA, K, CL, CO2, GLUCOSE, BUN, CREATININE, CALCIUM, MG, PHOS in the last 168 hours.  Liver Function Tests: No results for input(s): AST, ALT, ALKPHOS, BILITOT, PROT, ALBUMIN in the last 168 hours.  No results for input(s): LIPASE, AMYLASE in the last 168 hours. No results for input(s): AMMONIA in the last 168 hours. CBC: No results for input(s): WBC, NEUTROABS, HGB, HCT, MCV, PLT in the last 168 hours.  Cardiac Enzymes: No results for input(s): CKTOTAL, CKMB, CKMBINDEX, TROPONINI in the last 168 hours. BNP: Invalid input(s): POCBNP CBG: No results for input(s): GLUCAP in the last 168 hours. D-Dimer No results for input(s): DDIMER in the last 72  hours. Hgb A1c No results for input(s): HGBA1C in the last 72 hours. Lipid Profile No results for input(s): CHOL, HDL, LDLCALC, TRIG, CHOLHDL, LDLDIRECT in the last 72 hours. Thyroid function studies No results for input(s): TSH, T4TOTAL, T3FREE, THYROIDAB in the last 72 hours.  Invalid input(s): FREET3 Anemia work up No results for input(s): VITAMINB12, FOLATE, FERRITIN, TIBC, IRON, RETICCTPCT in the last 72 hours. Urinalysis    Component Value Date/Time   COLORURINE YELLOW 12/04/2019 0427   APPEARANCEUR CLEAR 12/04/2019 0427   LABSPEC 1.010 12/04/2019 0427   PHURINE 5.5 12/04/2019 0427   GLUCOSEU NEGATIVE 12/04/2019 0427   HGBUR NEGATIVE 12/04/2019 0427   BILIRUBINUR NEGATIVE 12/04/2019 0427   KETONESUR NEGATIVE 12/04/2019 0427   PROTEINUR TRACE (A) 12/04/2019 0427   NITRITE NEGATIVE 12/04/2019 0427   LEUKOCYTESUR NEGATIVE 12/04/2019 0427   Sepsis Labs Invalid input(s): PROCALCITONIN,  WBC,  LACTICIDVEN Microbiology No results found for this or any previous visit (from the past 240 hour(s)).    Time coordinating discharge: Over 30 minutes  SIGNED:   Pennie Banter, DO [facility] 04/16/2020, 1:25 PM   If 7PM-7AM, please contact night-coverage [email or web address]

## 2020-04-07 NOTE — Progress Notes (Signed)
Progress Note  Patient Name: Gregory Howell Date of Encounter: 04/07/2020  [facility] Cardiologist: [facility], [place] [facility]  Subjective   Abdomen less distended, close to baseline ("may be a little bit") Denies significant shortness of breath on ambulation He has been tracking his intake more closely, feels that he can regulate intake at home better Has been taking Lasix 40 daily, has never taken extra Lasix for weight gain or shortness of breath symptoms  Inpatient Medications    Scheduled Meds:  apixaban  5 mg Oral BID   aspirin EC  81 mg Oral Daily   atorvastatin  80 mg Oral Daily   carvedilol  25 mg Oral BID WC   furosemide  60 mg Intravenous BID   hydrALAZINE  50 mg Oral Q8H   isosorbide mononitrate  15 mg Oral Daily   losartan  25 mg Oral Daily   potassium chloride  40 mEq Oral Q4H   sodium chloride flush  3 mL Intravenous Q12H   Continuous Infusions:  sodium chloride     PRN Meds: sodium chloride, acetaminophen, methocarbamol, ondansetron (ZOFRAN) IV, sodium chloride flush   Vital Signs    Vitals:   04/07/20 0600 04/07/20 0601 04/07/20 0800 04/07/20 1152  BP: (!) 158/112 (!) 141/108 (!) 114/100 (!) 144/74  Pulse: 81 (!) 59 86 67  Resp: 20  19 19   Temp: 98.1 F (36.7 C)  97.9 F (36.6 C) (!) 97.5 F (36.4 C)  TempSrc: Oral  Oral Oral  SpO2: 98%  97% 96%  Weight: 99.4 kg     Height:        Intake/Output Summary (Last 24 hours) at 04/07/2020 1232 Last data filed at 04/07/2020 1149 Gross per 24 hour  Intake 960 ml  Output 2550 ml  Net -1590 ml   Last 3 Weights 04/07/2020 04/06/2020 04/05/2020  Weight (lbs) 219 lb 3.2 oz 222 lb 4.8 oz 221 lb  Weight (kg) 99.428 kg 100.835 kg 100.245 kg      Telemetry    Atrial fibrillation rate in the 80 range beats per minute- Personally Reviewed  ECG     - Personally Reviewed  Physical Exam   Constitutional:  oriented to person, place, and time. No distress.  HENT:  Head: Grossly  normal Eyes:  no discharge. No scleral icterus.  Neck: No JVD, no carotid bruits  Cardiovascular: Irregularly irregular no murmurs appreciated Pulmonary/Chest: Clear to auscultation bilaterally, no wheezes or rails Abdominal: Soft.  no distension.  no tenderness.  Musculoskeletal: Normal range of motion Neurological:  normal muscle tone. Coordination normal. No atrophy Skin: Skin warm and dry Psychiatric: normal affect, pleasant   Labs    High Sensitivity Troponin:   Recent Labs  Lab 04/04/20 1646 04/04/20 1854  TROPONINIHS 44* 44*      Chemistry Recent Labs  Lab 04/04/20 1646 04/04/20 1713 04/05/20 1557 04/06/20 0637 04/07/20 0549  NA   < >  --  138 135 136  K   < >  --  4.2 3.5 3.2*  CL   < >  --  94* 100 95*  CO2   < >  --  34* 26 27  GLUCOSE   < >  --  85 99 76  BUN   < >  --  18 22* 21*  CREATININE   < >  --  1.74* 1.77* 1.41*  CALCIUM   < >  --  8.9 8.6* 8.7*  PROT  --  7.3  --   --   --  ALBUMIN  --  3.8  --   --   --   AST  --  31  --   --   --   ALT  --  25  --   --   --   ALKPHOS  --  104  --   --   --   BILITOT  --  2.8*  --   --   --   GFRNONAA   < >  --  42* 41* 54*  GFRAA   < >  --  48* 47* >60  ANIONGAP   < >  --  10 9 14    < > = values in this interval not displayed.     Hematology Recent Labs  Lab 04/04/20 1646 04/04/20 1713 04/06/20 0637  WBC 11.5* 11.8* 10.4  RBC 4.74 4.71 4.79  HGB 14.5 14.2 14.5  HCT 40.2 39.0 40.9  MCV 84.8 82.8 85.4  MCH 30.6 30.1 30.3  MCHC 36.1* 36.4* 35.5  RDW 17.4* 17.4* 17.5*  PLT 229 231 239    BNP Recent Labs  Lab 04/04/20 1713  BNP 694.3*     DDimer No results for input(s): DDIMER in the last 168 hours.   Radiology    No results found.  Cardiac Studies   Echo Left ventricular ejection fraction, by estimation, is 40 to 45%. The  left ventricle has mildly decreased function. Hypokinesis of basal  regions.There is moderate left ventricular hypertrophy. Left ventricular  diastolic  parameters are indeterminate.  2. Right ventricular systolic function is mildly reduced. The right  ventricular size is mildly enlarged. There is mildly elevated pulmonary  artery systolic pressure.  3. Left atrial size was severely dilated.  4. Right atrial size was moderately dilated.  Patient Profile     Mr. Gregory Howell is a 59 year old gentleman with history of nonischemic cardiomyopathy, systolic CHF, hypertension, prior alcohol abuse, presenting with worsening shortness of breath, ankle swelling, abdominal distention and chest pain  Assessment & Plan    Acute on chronic diastolic and systolic CHF Mildly depressed ejection fraction 40 to 45%,  moderate LVH  Known hypertensive heart disease with prior history of chronic diastolic and systolic CHF --- Would continue Lasix IV twice daily, transition to Lasix 40 daily tomorrow with extra Lasix in the afternoon for 3 pound weight gain, abdominal distention or shortness of breath -We have recommended close follow-up with cardiology at the [facility] If he does decide to leave this afternoon, would recommend he take extra Lasix 40 or dose this afternoon  Hypertensive heart disease with CHF significant LVH on echocardiogram with diastolic dysfunction -Likely contributing to his heart failure symptoms -Blood pressures continue to run high to his hospital course, we will increase losartan up to 50 daily, isosorbide up to 30 mg daily --Recommend close monitoring of his blood pressure at home and close [facility]  Persistent atrial fibrillation, longstanding Dilated left atrium on echocardiogram and in the setting of LVH, diastolic dysfunction,  Has been asymptomatic from atrial fibrillation following discharge from the hospital April 2021 --- On carvedilol for rate control, NOAC He would likely have a difficult time maintaining normal sinus rhythm And as he is asymptomatic, no plan at this time to restore normal sinus rhythm  through cardioversion  Atypical chest pain Prior cardiac catheterization performed earlier this year, nonobstructive disease No plan for ischemic work-up  Cardiomyopathy Prior history alcohol, he has nonischemic cardiomyopathy He does report that he has cut way back  on his alcohol Cessation recommended   Total encounter time more than 25 minutes  Greater than 50% was spent in counseling and coordination of care with the patient   For questions or updates, please contact [facility] Please consult [email or web address] for contact info under        Signed, Julien Nordmann, MD  04/07/2020, 12:32 PM

## 2020-04-09 ENCOUNTER — Telehealth: Payer: Self-pay | Admitting: Family

## 2020-04-09 NOTE — Telephone Encounter (Signed)
Patients mother in law stated he is doing well since home from the hospital but wouldn't tell me more than that. She confirmed he would be here for his new patient CHF Clinic appointment 9/9.   Bernardino Dowell, NT

## 2020-04-18 ENCOUNTER — Ambulatory Visit: Payer: No Typology Code available for payment source | Admitting: Family

## 2020-04-18 ENCOUNTER — Telehealth: Payer: Self-pay | Admitting: Family

## 2020-04-18 NOTE — Telephone Encounter (Signed)
Patient did not show for his Heart Failure Clinic appointment on 04/18/20. Will attempt to reschedule.  °

## 2020-06-19 ENCOUNTER — Encounter: Payer: Self-pay | Admitting: Emergency Medicine

## 2020-06-19 ENCOUNTER — Emergency Department: Payer: No Typology Code available for payment source

## 2020-06-19 ENCOUNTER — Inpatient Hospital Stay
Admission: EM | Admit: 2020-06-19 | Discharge: 2020-06-23 | DRG: 291 | Disposition: A | Discharge status: Home / Self Care | Payer: No Typology Code available for payment source | Attending: Hospitalist | Admitting: Hospitalist

## 2020-06-19 ENCOUNTER — Other Ambulatory Visit: Payer: Self-pay

## 2020-06-19 DIAGNOSIS — T502X5A Adverse effect of carbonic-anhydrase inhibitors, benzothiadiazides and other diuretics, initial encounter: Secondary | ICD-10-CM | POA: Diagnosis not present

## 2020-06-19 DIAGNOSIS — Z8249 Family history of ischemic heart disease and other diseases of the circulatory system: Secondary | ICD-10-CM

## 2020-06-19 DIAGNOSIS — F17210 Nicotine dependence, cigarettes, uncomplicated: Secondary | ICD-10-CM | POA: Diagnosis present

## 2020-06-19 DIAGNOSIS — N183 Chronic kidney disease, stage 3 unspecified: Secondary | ICD-10-CM | POA: Diagnosis present

## 2020-06-19 DIAGNOSIS — I110 Hypertensive heart disease with heart failure: Secondary | ICD-10-CM | POA: Diagnosis not present

## 2020-06-19 DIAGNOSIS — Z6831 Body mass index (BMI) 31.0-31.9, adult: Secondary | ICD-10-CM

## 2020-06-19 DIAGNOSIS — Z833 Family history of diabetes mellitus: Secondary | ICD-10-CM | POA: Diagnosis not present

## 2020-06-19 DIAGNOSIS — I130 Hypertensive heart and chronic kidney disease with heart failure and stage 1 through stage 4 chronic kidney disease, or unspecified chronic kidney disease: Secondary | ICD-10-CM | POA: Diagnosis present

## 2020-06-19 DIAGNOSIS — E785 Hyperlipidemia, unspecified: Secondary | ICD-10-CM | POA: Diagnosis present

## 2020-06-19 DIAGNOSIS — R0602 Shortness of breath: Secondary | ICD-10-CM | POA: Diagnosis present

## 2020-06-19 DIAGNOSIS — Z9114 Patient's other noncompliance with medication regimen: Secondary | ICD-10-CM

## 2020-06-19 DIAGNOSIS — I272 Pulmonary hypertension, unspecified: Secondary | ICD-10-CM | POA: Diagnosis present

## 2020-06-19 DIAGNOSIS — I5043 Acute on chronic combined systolic (congestive) and diastolic (congestive) heart failure: Secondary | ICD-10-CM | POA: Diagnosis not present

## 2020-06-19 DIAGNOSIS — E861 Hypovolemia: Secondary | ICD-10-CM | POA: Diagnosis not present

## 2020-06-19 DIAGNOSIS — Z720 Tobacco use: Secondary | ICD-10-CM | POA: Diagnosis present

## 2020-06-19 DIAGNOSIS — I4891 Unspecified atrial fibrillation: Secondary | ICD-10-CM | POA: Diagnosis present

## 2020-06-19 DIAGNOSIS — Z79899 Other long term (current) drug therapy: Secondary | ICD-10-CM | POA: Diagnosis not present

## 2020-06-19 DIAGNOSIS — I5023 Acute on chronic systolic (congestive) heart failure: Secondary | ICD-10-CM | POA: Diagnosis present

## 2020-06-19 DIAGNOSIS — N1832 Chronic kidney disease, stage 3b: Secondary | ICD-10-CM | POA: Diagnosis present

## 2020-06-19 DIAGNOSIS — D6859 Other primary thrombophilia: Secondary | ICD-10-CM | POA: Diagnosis present

## 2020-06-19 DIAGNOSIS — D72829 Elevated white blood cell count, unspecified: Secondary | ICD-10-CM | POA: Diagnosis not present

## 2020-06-19 DIAGNOSIS — N179 Acute kidney failure, unspecified: Secondary | ICD-10-CM | POA: Diagnosis not present

## 2020-06-19 DIAGNOSIS — E669 Obesity, unspecified: Secondary | ICD-10-CM | POA: Diagnosis present

## 2020-06-19 DIAGNOSIS — Z91120 Patient's intentional underdosing of medication regimen due to financial hardship: Secondary | ICD-10-CM

## 2020-06-19 DIAGNOSIS — N1831 Chronic kidney disease, stage 3a: Secondary | ICD-10-CM | POA: Diagnosis present

## 2020-06-19 DIAGNOSIS — Z20822 Contact with and (suspected) exposure to covid-19: Secondary | ICD-10-CM | POA: Diagnosis present

## 2020-06-19 DIAGNOSIS — I4819 Other persistent atrial fibrillation: Secondary | ICD-10-CM | POA: Diagnosis present

## 2020-06-19 DIAGNOSIS — I428 Other cardiomyopathies: Secondary | ICD-10-CM | POA: Diagnosis present

## 2020-06-19 DIAGNOSIS — Z7982 Long term (current) use of aspirin: Secondary | ICD-10-CM

## 2020-06-19 DIAGNOSIS — Z7901 Long term (current) use of anticoagulants: Secondary | ICD-10-CM

## 2020-06-19 DIAGNOSIS — I160 Hypertensive urgency: Secondary | ICD-10-CM | POA: Diagnosis not present

## 2020-06-19 DIAGNOSIS — Z5900 Homelessness unspecified: Secondary | ICD-10-CM | POA: Diagnosis not present

## 2020-06-19 DIAGNOSIS — I2510 Atherosclerotic heart disease of native coronary artery without angina pectoris: Secondary | ICD-10-CM | POA: Diagnosis present

## 2020-06-19 HISTORY — DX: Unspecified systolic (congestive) heart failure: I50.20

## 2020-06-19 HISTORY — DX: Chronic kidney disease, stage 2 (mild): N18.2

## 2020-06-19 HISTORY — DX: Atherosclerotic heart disease of native coronary artery without angina pectoris: I25.10

## 2020-06-19 HISTORY — DX: Hypertensive heart disease without heart failure: I11.9

## 2020-06-19 HISTORY — DX: Other cardiomyopathies: I42.8

## 2020-06-19 HISTORY — DX: Other persistent atrial fibrillation: I48.19

## 2020-06-19 LAB — BASIC METABOLIC PANEL
Anion gap: 9 (ref 5–15)
BUN: 18 mg/dL (ref 8–23)
CO2: 24 mmol/L (ref 22–32)
Calcium: 8.7 mg/dL — ABNORMAL LOW (ref 8.9–10.3)
Chloride: 108 mmol/L (ref 98–111)
Creatinine, Ser: 1.34 mg/dL — ABNORMAL HIGH (ref 0.61–1.24)
GFR, Estimated: >60 mL/min (ref >60)
Glucose, Bld: 106 mg/dL — ABNORMAL HIGH (ref 70–99)
Potassium: 4.2 mmol/L (ref 3.5–5.1)
Sodium: 141 mmol/L (ref 135–145)

## 2020-06-19 LAB — BRAIN NATRIURETIC PEPTIDE: B Natriuretic Peptide: 1063.1 pg/mL — ABNORMAL HIGH (ref 0.0–100.0)

## 2020-06-19 LAB — URINALYSIS, COMPLETE (UACMP) WITH MICROSCOPIC
Bacteria, UA: NONE SEEN
Bilirubin Urine: NEGATIVE
Glucose, UA: NEGATIVE mg/dL
Hgb urine dipstick: NEGATIVE
Ketones, ur: NEGATIVE mg/dL
Leukocytes,Ua: NEGATIVE
Nitrite: NEGATIVE
Protein, ur: NEGATIVE mg/dL
Specific Gravity, Urine: 1.004 — ABNORMAL LOW (ref 1.005–1.030)
Squamous Epithelial / HPF: NONE SEEN (ref 0–5)
pH: 6 (ref 5.0–8.0)

## 2020-06-19 LAB — TROPONIN I (HIGH SENSITIVITY)
Troponin I (High Sensitivity): 44 ng/L — ABNORMAL HIGH (ref <18)
Troponin I (High Sensitivity): 34 ng/L — ABNORMAL HIGH (ref <18)

## 2020-06-19 LAB — CBC
HCT: 42.5 % (ref 39.0–52.0)
Hemoglobin: 15.2 g/dL (ref 13.0–17.0)
MCH: 29.4 pg (ref 26.0–34.0)
MCHC: 35.8 g/dL (ref 30.0–36.0)
MCV: 82.2 fL (ref 80.0–100.0)
Platelets: 297 10*3/uL (ref 150–400)
RBC: 5.17 MIL/uL (ref 4.22–5.81)
RDW: 17.2 % — ABNORMAL HIGH (ref 11.5–15.5)
WBC: 13.4 10*3/uL — ABNORMAL HIGH (ref 4.0–10.5)
nRBC: 0 % (ref 0.0–0.2)

## 2020-06-19 LAB — HEPATIC FUNCTION PANEL
ALT: 28 U/L (ref 0–44)
AST: 30 U/L (ref 15–41)
Albumin: 3.9 g/dL (ref 3.5–5.0)
Alkaline Phosphatase: 138 U/L — ABNORMAL HIGH (ref 38–126)
Bilirubin, Direct: 0.8 mg/dL — ABNORMAL HIGH (ref 0.0–0.2)
Indirect Bilirubin: 2.1 mg/dL — ABNORMAL HIGH (ref 0.3–0.9)
Total Bilirubin: 2.9 mg/dL — ABNORMAL HIGH (ref 0.3–1.2)
Total Protein: 7.6 g/dL (ref 6.5–8.1)

## 2020-06-19 LAB — RESPIRATORY PANEL BY RT PCR (FLU A&B, COVID)
Influenza A by PCR: NEGATIVE
Influenza B by PCR: NEGATIVE
SARS Coronavirus 2 by RT PCR: NEGATIVE

## 2020-06-19 LAB — ETHANOL: Alcohol, Ethyl (B): <10 mg/dL (ref <10)

## 2020-06-19 MED ORDER — LORAZEPAM 2 MG/ML IJ SOLN
1.0000 mg | Freq: Once | INTRAMUSCULAR | Status: AC
  Administered 2020-06-20: 1 mg via INTRAVENOUS
  Filled 2020-06-19: qty 1

## 2020-06-19 MED ORDER — NITROGLYCERIN 0.4 MG SL SUBL: 0.4000 mg | SUBLINGUAL_TABLET | SUBLINGUAL | Status: DC | PRN

## 2020-06-19 MED ORDER — CARVEDILOL 25 MG PO TABS
25.0000 mg | ORAL_TABLET | Freq: Once | ORAL | Status: AC
  Administered 2020-06-19: 25 mg via ORAL
  Filled 2020-06-19: qty 1

## 2020-06-19 MED ORDER — HYDRALAZINE HCL 20 MG/ML IJ SOLN
5.0000 mg | Freq: Once | INTRAMUSCULAR | Status: AC
  Administered 2020-06-19: 5 mg via INTRAVENOUS
  Filled 2020-06-19: qty 1

## 2020-06-19 MED ORDER — METOPROLOL TARTRATE 5 MG/5ML IV SOLN
5.0000 mg | Freq: Once | INTRAVENOUS | Status: AC
  Administered 2020-06-19: 5 mg via INTRAVENOUS
  Filled 2020-06-19: qty 5

## 2020-06-19 MED ORDER — ISOSORBIDE MONONITRATE ER 60 MG PO TB24
30.0000 mg | ORAL_TABLET | Freq: Once | ORAL | Status: AC
  Administered 2020-06-19: 30 mg via ORAL
  Filled 2020-06-19: qty 1

## 2020-06-19 MED ORDER — FUROSEMIDE 10 MG/ML IJ SOLN
40.0000 mg | Freq: Once | INTRAMUSCULAR | Status: AC
  Administered 2020-06-19: 40 mg via INTRAVENOUS
  Filled 2020-06-19: qty 4

## 2020-06-19 NOTE — ED Notes (Signed)
meds given for heart rate.  Pt alert   No chest pain . Pt has sob.

## 2020-06-19 NOTE — ED Provider Notes (Signed)
[facility] Emergency Department Provider Note  ____________________________________________   First MD Initiated Contact with Patient 06/19/20 2045     (approximate)  I have reviewed the triage vital signs and the nursing notes.   HISTORY  Chief Complaint Shortness of Breath    HPI Gregory Howell is a 59 y.o. male  With h/o HTN, CHF, here with SOB. Pt states that for the past 2 weeks, he's had worsening LE edema, orthopnea, and PND. He states he has been "low" on his medications and has been taking them intermittently though not as much as he should. He reports that for the past few days, he's been extremely SOB and now cannot sleep or really walk w/o extreme dyspnea. He feels like he is smothering. He reports he's also had a dry cough. No chest pain.       Past Medical History:  Diagnosis Date  . HTN (hypertension)     Patient Active Problem List   Diagnosis Date Noted  . Acute on chronic combined systolic and diastolic CHF (congestive heart failure) (HCC) 04/05/2020  . Nonobstructive atherosclerosis of coronary artery 12/07/2019  . AKI (acute kidney injury) (HCC) 12/07/2019  . Acute HFrEF (heart failure with reduced ejection fraction) (HCC)   . Atrial fibrillation with RVR (HCC) 12/04/2019  . Substance induced mood disorder (HCC) 02/22/2017  . Alcohol intoxication (HCC) 02/22/2017  . Chest pain with moderate risk for cardiac etiology 11/02/2016  . Hypertensive urgency 11/02/2016  . Tobacco abuse 11/02/2016  . Leukocytosis 11/02/2016    Past Surgical History:  Procedure Laterality Date  . RIGHT/LEFT HEART CATH AND CORONARY ANGIOGRAPHY N/A 12/07/2019   Procedure: RIGHT/LEFT HEART CATH AND CORONARY ANGIOGRAPHY;  Surgeon: Antonieta Iba, MD;  Location: [facility] INVASIVE CV LAB;  Service: Cardiovascular;  Laterality: N/A;    Prior to Admission medications   Medication Sig Start Date End Date Taking? Authorizing Provider  apixaban (ELIQUIS) 5 MG  TABS tablet Take 1 tablet (5 mg total) by mouth 2 (two) times daily. 12/08/19  Yes Standley Brooking, MD  aspirin EC 81 MG EC tablet Take 1 tablet (81 mg total) by mouth daily. 11/03/16  Yes Sudini, Wardell Heath, MD  atorvastatin (LIPITOR) 80 MG tablet Take 1 tablet (80 mg total) by mouth daily. 12/08/19  Yes Standley Brooking, MD  carvedilol (COREG) 25 MG tablet Take 1 tablet (25 mg total) by mouth 2 (two) times daily with a meal. 12/08/19  Yes Standley Brooking, MD  furosemide (LASIX) 40 MG tablet Take 1 tablet (40 mg total) by mouth daily. On Monday, Wednesday, Friday, take 40 mg TWICE daily. 04/07/20  Yes Esaw Grandchild A, DO  hydrALAZINE (APRESOLINE) 50 MG tablet Take 1 tablet (50 mg total) by mouth every 8 (eight) hours. 12/08/19  Yes Standley Brooking, MD  isosorbide mononitrate (IMDUR) 30 MG 24 hr tablet Take 1 tablet (30 mg total) by mouth daily. 04/08/20  Yes Esaw Grandchild A, DO  losartan (COZAAR) 50 MG tablet Take 1 tablet (50 mg total) by mouth daily. 04/08/20  Yes Esaw Grandchild A, DO  methocarbamol (ROBAXIN) 500 MG tablet Take 1 tablet (500 mg total) by mouth every 8 (eight) hours as needed for muscle spasms (cramping). 04/07/20  Yes Pennie Banter, DO    Allergies Patient has no known allergies.  Family History  Problem Relation Age of Onset  . Hypertension Mother   . Hypertension Father   . CAD Brother   . Diabetes Brother  Social History Social History   Tobacco Use  . Smoking status: Current Every Day Smoker    Packs/day: 0.50    Types: Cigarettes  . Smokeless tobacco: Never Used  Substance Use Topics  . Alcohol use: Yes    Alcohol/week: 7.0 - 14.0 standard drinks    Types: 7 - 14 Cans of beer per week    Comment: daily  . Drug use: Never    Review of Systems  Review of Systems  Constitutional: Positive for fatigue. Negative for chills and fever.  HENT: Negative for sore throat.   Respiratory: Positive for cough and shortness of breath.   Cardiovascular:  Positive for leg swelling. Negative for chest pain.  Gastrointestinal: Negative for abdominal pain.  Genitourinary: Negative for flank pain.  Musculoskeletal: Negative for neck pain.  Skin: Negative for rash and wound.  Allergic/Immunologic: Negative for immunocompromised state.  Neurological: Negative for weakness and numbness.  Hematological: Does not bruise/bleed easily.  All other systems reviewed and are negative.    ____________________________________________  PHYSICAL EXAM:      VITAL SIGNS: ED Triage Vitals [06/19/20 1638]  Enc Vitals Group     BP (!) 169/137     Pulse Rate (!) 109     Resp (!) 22     Temp 98.7 F (37.1 C)     Temp Source Oral     SpO2 96 %     Weight 215 lb (97.5 kg)     Height 5\' 9"  (1.753 m)     Head Circumference      Peak Flow      Pain Score 0     Pain Loc      Pain Edu?      Excl. in GC?      Physical Exam Vitals and nursing note reviewed.  Constitutional:      General: He is not in acute distress.    Appearance: He is well-developed.  HENT:     Head: Normocephalic and atraumatic.  Eyes:     Conjunctiva/sclera: Conjunctivae normal.  Cardiovascular:     Rate and Rhythm: Normal rate and regular rhythm.     Heart sounds: Normal heart sounds. No murmur heard.  No friction rub.  Pulmonary:     Effort: Pulmonary effort is normal. Tachypnea present. No respiratory distress.     Breath sounds: Examination of the right-lower field reveals rales. Examination of the left-lower field reveals rales. Rales present. No wheezing.  Abdominal:     General: There is no distension.     Palpations: Abdomen is soft.     Tenderness: There is no abdominal tenderness.  Musculoskeletal:     Cervical back: Neck supple.     Right lower leg: Edema present.     Left lower leg: Edema present.  Skin:    General: Skin is warm.     Capillary Refill: Capillary refill takes less than 2 seconds.  Neurological:     Mental Status: He is alert and oriented to  person, place, and time.     Motor: No abnormal muscle tone.       ____________________________________________   LABS (all labs ordered are listed, but only abnormal results are displayed)  Labs Reviewed  BASIC METABOLIC PANEL - Abnormal; Notable for the following components:      Result Value   Glucose, Bld 106 (*)    Creatinine, Ser 1.34 (*)    Calcium 8.7 (*)    All other components within normal limits  CBC - Abnormal; Notable for the following components:   WBC 13.4 (*)    RDW 17.2 (*)    All other components within normal limits  BRAIN NATRIURETIC PEPTIDE - Abnormal; Notable for the following components:   B Natriuretic Peptide 1,063.1 (*)    All other components within normal limits  HEPATIC FUNCTION PANEL - Abnormal; Notable for the following components:   Alkaline Phosphatase 138 (*)    Total Bilirubin 2.9 (*)    Bilirubin, Direct 0.8 (*)    Indirect Bilirubin 2.1 (*)    All other components within normal limits  TROPONIN I (HIGH SENSITIVITY) - Abnormal; Notable for the following components:   Troponin I (High Sensitivity) 44 (*)    All other components within normal limits  RESPIRATORY PANEL BY RT PCR (FLU A&B, COVID)  TROPONIN I (HIGH SENSITIVITY)    ____________________________________________  EKG: Atrial fibrillation, ventricular rate 113.  QRS 88, QTc 491.  Nonspecific T wave changes, likely rate related versus demand.  No ischemia or infarct. ________________________________________  RADIOLOGY All imaging, including plain films, CT scans, and ultrasounds, independently reviewed by me, and interpretations confirmed via formal radiology reads.  ED MD interpretation:   Chest x-ray: Interstitial edema with bilateral pleural effusions  Official radiology report(s): DG Chest 2 View  Result Date: 06/19/2020 CLINICAL DATA:  Worsening shortness of breath for 2 days, atrial fibrillation EXAM: CHEST - 2 VIEW COMPARISON:  04/04/2020 FINDINGS: Frontal and  lateral views of the chest demonstrates stable enlargement of the cardiac silhouette. Increased central vascular congestion and interstitial prominence compatible with edema. Trace bilateral pleural effusions. No pneumothorax. No acute bony abnormalities. IMPRESSION: 1. Developing interstitial edema with small bilateral pleural effusions. Electronically Signed   By: Sharlet Salina M.D.   On: 06/19/2020 17:44    ____________________________________________  PROCEDURES   Procedure(s) performed (including Critical Care):  .Critical Care Performed by: Shaune Pollack, MD Authorized by: Shaune Pollack, MD   Critical care provider statement:    Critical care time (minutes):  35   Critical care time was exclusive of:  Separately billable procedures and treating other patients and teaching time   Critical care was necessary to treat or prevent imminent or life-threatening deterioration of the following conditions:  Circulatory failure, cardiac failure and respiratory failure   Critical care was time spent personally by me on the following activities:  Development of treatment plan with patient or surrogate, discussions with consultants, evaluation of patient's response to treatment, examination of patient, obtaining history from patient or surrogate, ordering and performing treatments and interventions, ordering and review of laboratory studies, ordering and review of radiographic studies, pulse oximetry, re-evaluation of patient's condition and review of old charts   I assumed direction of critical care for this patient from another provider in my specialty: no      ____________________________________________  INITIAL IMPRESSION / MDM / ASSESSMENT AND PLAN / ED COURSE  As part of my medical decision making, I reviewed the following data within the electronic MEDICAL RECORD NUMBER Nursing notes reviewed and incorporated, Old chart reviewed, Notes from prior ED visits, and [place] Controlled Substance  Database       *Shahram Frew was evaluated in Emergency Department on 06/19/2020 for the symptoms described in the history of present illness. He was evaluated in the context of the global COVID-19 pandemic, which necessitated consideration that the patient might be at risk for infection with the SARS-CoV-2 virus that causes COVID-19. Institutional protocols and algorithms that pertain to the evaluation  of patients at risk for COVID-19 are in a state of rapid change based on information released by regulatory bodies including the CDC and federal and state organizations. These policies and algorithms were followed during the patient's care in the ED.  Some ED evaluations and interventions may be delayed as a result of limited staffing during the pandemic.*     Medical Decision Making: 59 year old male here with severe shortness of breath, leg swelling, abdominal distention.  On arrival, patient and A. fib RVR with severe hypertension.  Chest x-ray shows interstitial edema and bilateral effusions on my review.  Clinically, suspect acute CHF exacerbation with hypertensive urgency.  Patient given IV Lasix, nitroglycerin, as well as his Coreg dose.  Lopressor IV given for A. fib RVR with improvement in rate.  Patient also given hydralazine for his severe hypertension with gradual improvement.  Patient feels markedly improved with the above treatment.  Given his ongoing shortness of breath, however, and severe hypertension, will admit for further work-up.  Lab work reviewed, shows mild troponin elevation likely related to demand.  BNP greater than 1000 consistent with CHF.  Mild CKD noted with creatinine 1.34.  White count 13.4, likely reactive, denies any fevers or chills or recent infectious symptoms.  ____________________________________________  FINAL CLINICAL IMPRESSION(S) / ED DIAGNOSES  Final diagnoses:  Acute on chronic systolic congestive heart failure (HCC)     MEDICATIONS GIVEN DURING THIS  VISIT:  Medications  nitroGLYCERIN (NITROSTAT) SL tablet 0.4 mg (has no administration in time range)  furosemide (LASIX) injection 40 mg (40 mg Intravenous Given 06/19/20 2119)  carvedilol (COREG) tablet 25 mg (25 mg Oral Given 06/19/20 2119)  metoprolol tartrate (LOPRESSOR) injection 5 mg (5 mg Intravenous Given 06/19/20 2145)  hydrALAZINE (APRESOLINE) injection 5 mg (5 mg Intravenous Given 06/19/20 2212)  isosorbide mononitrate (IMDUR) 24 hr tablet 30 mg (30 mg Oral Given 06/19/20 2213)     ED Discharge Orders    None       Note:  This document was prepared using Dragon voice recognition software and may include unintentional dictation errors.   Shaune Pollack, MD 06/19/20 2244

## 2020-06-19 NOTE — ED Notes (Signed)
Report off to zach rn  

## 2020-06-19 NOTE — ED Notes (Signed)
Pt reports sob for 3 days.  Pt denies chest pain.  No n/v/d.  cig smoker.  Pt has a cough.  afib on monitor.  Iv started and meds given.

## 2020-06-19 NOTE — ED Triage Notes (Signed)
First Nurse Note:  C/O 2-3 day history of SOB.  Patietn is AAOx3.  Skin warm and dry.  DOE noted, resolves with rest.  Wheelchair offered, patient refused.

## 2020-06-19 NOTE — H&P (Signed)
Gregory Howell CZY:[id number] DOB: Oct 04, 1960 DOA: 06/19/2020     PCP: [facility]   Outpatient Specialists:   CARDS:  Dr. Mariah Milling    Patient arrived to ER on 06/19/20 at 1624 Referred by Attending Shaune Pollack, MD   Patient coming from: home Lives  With family    Chief Complaint:  Chief Complaint  Patient presents with  . Shortness of Breath    HPI: Gregory Howell is a 59 y.o. male with medical history significant of atrial fibrillation, chronic combined systolic diastolic CHF, CKD, alcohol abuse, hypertension, tobacco abuse    Presented with reports shortness of breath for past 2 days  Ran out of his medications for the past week although he did find it eventually For past 2 weeks he has been having lower extremity edema orthopnea and PND. Even before he ran out of his medicines he has been taking it intermittently because he did not have enough. But for the past few days he been severely short of breath he cannot even sleep or walk resulting in severe dyspnea.  Feels like his body gets moderate Reports a little bit of a dry cough but no chest pain  States has not been drinking for the past 1 year. Still smokes 3-4 cigarettes  per day  Infectious risk factors:  Reports , shortness of breath, dry cough,      Has   been vaccinated against COVID x2 in [place]   Initial COVID TEST  NEGATIVE   Lab Results  Component Value Date   SARSCOV2NAA NEGATIVE 06/19/2020   SARSCOV2NAA NEGATIVE 04/04/2020   SARSCOV2NAA NEGATIVE 12/04/2019    Regarding pertinent Chronic problems:    Hyperlipidemia -  on statins Lipitor    HTN on Coreg hydralazine, Imdur Kolmer Cozaar   chronic CHF diastolic/systolic/ combined - last echo august 2021 EF 40-45% Right ventricular systolic function is mildly reduced mildly elevated pulmonary  artery systolic pressure.   CAD  - On Aspirin, statin, betablocker                 -  followed by cardiology                - last  cardiac cath April 2021      obesity-   BMI Readings from Last 1 Encounters:  06/19/20 31.75 kg/m      A. Fib -  - CHA2DS2 vas score   2 Points:      current  on anticoagulation with Eliquis,          -  Rate control:  Currently controlled with  Coreg       CKD stage IIIa- baseline Cr   Estimated Creatinine Clearance: 66.7 mL/min (A) (by C-G formula based on SCr of 1.34 mg/dL (H)).  Lab Results  Component Value Date   CREATININE 1.34 (H) 06/19/2020   CREATININE 1.41 (H) 04/07/2020   CREATININE 1.77 (H) 04/06/2020      While in ER: Troponin 44  Noted to be in A. fib with RVR heart rate of 113 Chest x-ray showing CHF Was given IV Lasix nitroglycerin and Coreg dose given IV Lopressor for his A. fib and improved and rate. Given a dose of hydralazine for severe hypertension and improved But still continues to have ongoing shortness of breath    Hospitalist was called for admission for CHF exacerbation and A. fib with RVR  The following Work up has been ordered so far:  Orders Placed  This Encounter  Procedures  . Critical Care  . Respiratory Panel by RT PCR (Flu A&B, Covid) - Nasopharyngeal Swab  . DG Chest 2 View  . Basic metabolic panel  . CBC  . Brain natriuretic peptide  . Hepatic function panel  . Document Height and Actual Weight  . Cardiac monitoring  . Consult to hospitalist  ALL PATIENTS BEING ADMITTED/HAVING PROCEDURES NEED COVID-19 SCREENING  . Consult to hospitalist  ALL PATIENTS BEING ADMITTED/HAVING PROCEDURES NEED COVID-19 SCREENING  . ED EKG     Following Medications were ordered in ER: Medications  nitroGLYCERIN (NITROSTAT) SL tablet 0.4 mg (has no administration in time range)  furosemide (LASIX) injection 40 mg (40 mg Intravenous Given 06/19/20 2119)  carvedilol (COREG) tablet 25 mg (25 mg Oral Given 06/19/20 2119)  metoprolol tartrate (LOPRESSOR) injection 5 mg (5 mg Intravenous Given 06/19/20 2145)  hydrALAZINE (APRESOLINE) injection 5 mg (5 mg  Intravenous Given 06/19/20 2212)  isosorbide mononitrate (IMDUR) 24 hr tablet 30 mg (30 mg Oral Given 06/19/20 2213)        Consult Orders  (From admission, onward)         Start     Ordered   06/19/20 2220  Consult to hospitalist  ALL PATIENTS BEING ADMITTED/HAVING PROCEDURES NEED COVID-19 SCREENING  Once       Comments: ALL PATIENTS BEING ADMITTED/HAVING PROCEDURES NEED COVID-19 SCREENING  Provider:  (Not yet assigned)  Question Answer Comment  Place call to: Hospitalist   Reason for Consult Admit      06/19/20 2219   06/19/20 2208  Consult to hospitalist  ALL PATIENTS BEING ADMITTED/HAVING PROCEDURES NEED COVID-19 SCREENING  Once       Comments: ALL PATIENTS BEING ADMITTED/HAVING PROCEDURES NEED COVID-19 SCREENING  Provider:  (Not yet assigned)  Question Answer Comment  Place call to: Hospitalist   Reason for Consult Admit      06/19/20 2208           Significant initial  Findings: Abnormal Labs Reviewed  BASIC METABOLIC PANEL - Abnormal; Notable for the following components:      Result Value   Glucose, Bld 106 (*)    Creatinine, Ser 1.34 (*)    Calcium 8.7 (*)    All other components within normal limits  CBC - Abnormal; Notable for the following components:   WBC 13.4 (*)    RDW 17.2 (*)    All other components within normal limits  BRAIN NATRIURETIC PEPTIDE - Abnormal; Notable for the following components:   B Natriuretic Peptide 1,063.1 (*)    All other components within normal limits  HEPATIC FUNCTION PANEL - Abnormal; Notable for the following components:   Alkaline Phosphatase 138 (*)    Total Bilirubin 2.9 (*)    Bilirubin, Direct 0.8 (*)    Indirect Bilirubin 2.1 (*)    All other components within normal limits  TROPONIN I (HIGH SENSITIVITY) - Abnormal; Notable for the following components:   Troponin I (High Sensitivity) 44 (*)    All other components within normal limits     Otherwise labs showing:    Recent Labs  Lab 06/19/20 1640  NA  141  K 4.2  CO2 24  GLUCOSE 106*  BUN 18  CREATININE 1.34*  CALCIUM 8.7*  Cr    Stable  Lab Results  Component Value Date   CREATININE 1.34 (H) 06/19/2020   CREATININE 1.41 (H) 04/07/2020   CREATININE 1.77 (H) 04/06/2020    Recent Labs  Lab 06/19/20  2118  AST 30  ALT 28  ALKPHOS 138*  BILITOT 2.9*  PROT 7.6  ALBUMIN 3.9   Lab Results  Component Value Date   CALCIUM 8.7 (L) 06/19/2020   WBC      Component Value Date/Time   WBC 13.4 (H) 06/19/2020 1640   LYMPHSABS 1.9 04/04/2020 1713   MONOABS 0.9 04/04/2020 1713   EOSABS 0.2 04/04/2020 1713   BASOSABS 0.1 04/04/2020 1713    Plt: Lab Results  Component Value Date   PLT 297 06/19/2020        HG/HCT stable,     Component Value Date/Time   HGB 15.2 06/19/2020 1640   HGB 16.1 06/06/2014 0005   HCT 42.5 06/19/2020 1640   HCT 46.8 06/06/2014 0005   MCV 82.2 06/19/2020 1640   MCV 89 06/06/2014 0005    No results for input(s): LIPASE, AMYLASE in the last 168 hours. No results for input(s): AMMONIA in the last 168 hours.     Troponin 44 -34   ECG: Ordered Personally reviewed by me showing: HR :  113 Rhythm:   A.fib/flutter. W RVR,   no evidence of ischemic changes QTC 491   BNP (last 3 results) Recent Labs    12/03/19 2152 04/04/20 1713 06/19/20 1640  BNP 660.0* 694.3* 1,063.1*      DM  labs:  HbA1C: Recent Labs    12/05/19 0541  HGBA1C 5.4        UA    no evidence of UTI   Urine analysis:    Component Value Date/Time   COLORURINE STRAW (A) 06/19/2020 2305   APPEARANCEUR CLEAR (A) 06/19/2020 2305   LABSPEC 1.004 (L) 06/19/2020 2305   PHURINE 6.0 06/19/2020 2305   GLUCOSEU NEGATIVE 06/19/2020 2305   HGBUR NEGATIVE 06/19/2020 2305   BILIRUBINUR NEGATIVE 06/19/2020 2305   KETONESUR NEGATIVE 06/19/2020 2305   PROTEINUR NEGATIVE 06/19/2020 2305   NITRITE NEGATIVE 06/19/2020 2305   LEUKOCYTESUR NEGATIVE 06/19/2020 2305       Ordered  CXR -  CHF    ED Triage Vitals [06/19/20  1638]  Enc Vitals Group     BP (!) 169/137     Pulse Rate (!) 109     Resp (!) 22     Temp 98.7 F (37.1 C)     Temp Source Oral     SpO2 96 %     Weight 215 lb (97.5 kg)     Height 5\' 9"  (1.753 m)     Head Circumference      Peak Flow      Pain Score 0     Pain Loc      Pain Edu?      Excl. in GC?         Latest  Blood pressure (!) 165/126, pulse (!) 44, temperature 98.7 F (37.1 C), temperature source Oral, resp. rate (!) 50, height 5\' 9"  (1.753 m), weight 97.5 kg, SpO2 98 %.    Review of Systems:    Pertinent positives include:   shortness of breath at rest.  dyspnea on exertion , Orthopnea, PND,   Bilateral lower extremity swelling Constitutional:  No weight loss, night sweats, Fevers, chills, fatigue, weight loss  HEENT:  No headaches, Difficulty swallowing,Tooth/dental problems,Sore throat,  No sneezing, itching, ear ache, nasal congestion, post nasal drip,  Cardio-vascular:  No chest painanasarca, dizziness, palpitations.  GI:  No heartburn, indigestion, abdominal pain, nausea, vomiting, diarrhea, change in bowel habits, loss of appetite, melena, blood  in stool, hematemesis Resp:  no No, No excess mucus, no productive cough, No non-productive cough, No coughing up of blood.No change in color of mucus.No wheezing. Skin:  no rash or lesions. No jaundice GU:  no dysuria, change in color of urine, no urgency or frequency. No straining to urinate.  No flank pain.  Musculoskeletal:  No joint pain or no joint swelling. No decreased range of motion. No back pain.  Psych:  No change in mood or affect. No depression or anxiety. No memory loss.  Neuro: no localizing neurological complaints, no tingling, no weakness, no double vision, no gait abnormality, no slurred speech, no confusion  All systems reviewed and apart from HOPI all are negative  Past Medical History:   Past Medical History:  Diagnosis Date  . HTN (hypertension)       Past Surgical  History:  Procedure Laterality Date  . RIGHT/LEFT HEART CATH AND CORONARY ANGIOGRAPHY N/A 12/07/2019   Procedure: RIGHT/LEFT HEART CATH AND CORONARY ANGIOGRAPHY;  Surgeon: Antonieta Iba, MD;  Location: [facility] INVASIVE CV LAB;  Service: Cardiovascular;  Laterality: N/A;    Social History:  Ambulatory  Independently      reports that he has been smoking cigarettes. He has been smoking about 0.50 packs per day. He has never used smokeless tobacco. He reports current alcohol use of about 7.0 - 14.0 standard drinks of alcohol per week. He reports that he does not use drugs.   Family History:   Family History  Problem Relation Age of Onset  . Hypertension Mother   . Hypertension Father   . CAD Brother   . Diabetes Brother     Allergies: No Known Allergies   Prior to Admission medications   Medication Sig Start Date End Date Taking? Authorizing Provider  apixaban (ELIQUIS) 5 MG TABS tablet Take 1 tablet (5 mg total) by mouth 2 (two) times daily. 12/08/19  Yes Standley Brooking, MD  aspirin EC 81 MG EC tablet Take 1 tablet (81 mg total) by mouth daily. 11/03/16  Yes Sudini, Wardell Heath, MD  atorvastatin (LIPITOR) 80 MG tablet Take 1 tablet (80 mg total) by mouth daily. 12/08/19  Yes Standley Brooking, MD  carvedilol (COREG) 25 MG tablet Take 1 tablet (25 mg total) by mouth 2 (two) times daily with a meal. 12/08/19  Yes Standley Brooking, MD  furosemide (LASIX) 40 MG tablet Take 1 tablet (40 mg total) by mouth daily. On Monday, Wednesday, Friday, take 40 mg TWICE daily. 04/07/20  Yes Esaw Grandchild A, DO  hydrALAZINE (APRESOLINE) 50 MG tablet Take 1 tablet (50 mg total) by mouth every 8 (eight) hours. 12/08/19  Yes Standley Brooking, MD  isosorbide mononitrate (IMDUR) 30 MG 24 hr tablet Take 1 tablet (30 mg total) by mouth daily. 04/08/20  Yes Esaw Grandchild A, DO  losartan (COZAAR) 50 MG tablet Take 1 tablet (50 mg total) by mouth daily. 04/08/20  Yes Esaw Grandchild A, DO  methocarbamol  (ROBAXIN) 500 MG tablet Take 1 tablet (500 mg total) by mouth every 8 (eight) hours as needed for muscle spasms (cramping). 04/07/20  Yes Pennie Banter, DO   Physical Exam: Vitals with BMI 06/19/2020 06/19/2020 06/19/2020  Height - - -  Weight - - -  BMI - - -  Systolic - - -  Diastolic - - -  Pulse 44 117 108     1. General:  in No  Acute distress   Chronically ill -appearing 2. Psychological: Alert  and  Oriented 3. Head/ENT:   Moist    Mucous Membranes                          Head Non traumatic, neck supple                         Poor Dentition 4. SKIN: normal  Skin turgor,  Skin clean Dry and intact no rash 5. Heart: irRegular rate and rhythm no  Murmur, no Rub or gallop 6. Lungs: no wheezes mild  crackles   7. Abdomen: Soft,  non-tender, Non distended  bowel sounds present 8. Lower extremities: no clubbing, cyanosis, no edema 9. Neurologically Grossly intact, moving all 4 extremities equally   10. MSK: Normal range of motion   All other LABS:     Recent Labs  Lab 06/19/20 1640  WBC 13.4*  HGB 15.2  HCT 42.5  MCV 82.2  PLT 297     Recent Labs  Lab 06/19/20 1640  NA 141  K 4.2  CL 108  CO2 24  GLUCOSE 106*  BUN 18  CREATININE 1.34*  CALCIUM 8.7*     Recent Labs  Lab 06/19/20 2118  AST 30  ALT 28  ALKPHOS 138*  BILITOT 2.9*  PROT 7.6  ALBUMIN 3.9       Cultures:    Component Value Date/Time   SDES BLOOD RIGHT ANTECUBITAL 12/04/2019 0427   SDES BLOOD RIGHT ANTECUBITAL 12/04/2019 0427   SDES  12/04/2019 0427    IN/OUT CATH URINE Performed at [facility], [address] [place], [place] [address]    [facility]  12/04/2019 0427    BOTTLES DRAWN AEROBIC AND ANAEROBIC Blood Culture results may not be optimal due to an excessive volume of blood received in culture bottles   SPECREQUEST  12/04/2019 0427    BOTTLES DRAWN AEROBIC AND ANAEROBIC Blood Culture results may not be optimal due to an excessive volume of blood received in  culture bottles   SPECREQUEST  12/04/2019 0427    NONE Performed at [facility], [address], [place], [place] [address]    CULT  12/04/2019 0427    NO GROWTH 5 DAYS Performed at [facility], [address] [place], [place] [address]    CULT  12/04/2019 0427    NO GROWTH 5 DAYS Performed at [facility], [address] [place], [place] [address]    CULT  12/04/2019 0427    NO GROWTH Performed at [facility], [address], [place], [place] [address]    REPTSTATUS 12/09/2019 FINAL 12/04/2019 0427   REPTSTATUS 12/09/2019 FINAL 12/04/2019 0427   REPTSTATUS 12/05/2019 FINAL 12/04/2019 0427     Radiological Exams on Admission: DG Chest 2 View  Result Date: 06/19/2020 CLINICAL DATA:  Worsening shortness of breath for 2 days, atrial fibrillation EXAM: CHEST - 2 VIEW COMPARISON:  04/04/2020 FINDINGS: Frontal and lateral views of the chest demonstrates stable enlargement of the cardiac silhouette. Increased central vascular congestion and interstitial prominence compatible with edema. Trace bilateral pleural effusions. No pneumothorax. No acute bony abnormalities. IMPRESSION: 1. Developing interstitial edema with small bilateral pleural effusions. Electronically Signed   By: Sharlet Salina M.D.   On: 06/19/2020 17:44    Chart has been reviewed    Assessment/Plan  59 y.o. male with medical history significant of atrial fibrillation, chronic combined systolic diastolic CHF, CKD, alcohol abuse, hypertension, tobacco  abuse  Admitted for acute on chronic combined systolic diastolic CHF, A. fib with RVR, hypertensive urgency  Present on Admission: . Acute on chronic combined systolic (congestive) and diastolic (congestive) heart failure (HCC) -  - admit on telemetry,  cycle cardiac enzymes, Troponin 44-34   obtain serial ECG  to evaluate for ischemia as a cause of heart failure  monitor daily weight:  Filed Weights   06/19/20 1638  Weight:  97.5 kg   Last BNP BNP (last 3 results) Recent Labs    12/03/19 2152 04/04/20 1713 06/19/20 1640  BNP 660.0* 694.3* 1,063.1*     diurese with IV lasix and monitor orthostatics and creatinine to avoid over diuresis.  Order echogram to evaluate EF and valves  ACE/ARBi ordered    cardiology consulted   . Tobacco abuse -  - Spoke about importance of quitting spent 5 minutes discussing options for treatment, prior attempts at quitting, and dangers of smoking  -At this point patient is    interested in quitting  - order nicotine patch   - nursing tobacco cessation protocol  . Leukocytosis -chronic mild   . Hypertensive urgency -most likely secondary to noncompliance with home medications will restart home meds and continue to monitor blood pressure currently stable   . Atrial fibrillation with RVR (HCC) -continue Eliquis Restart Coreg If have persistent severe A. fib with RVR but may need IV rate control Patient with history of reduced EF Defer to cardiology if diltiazem would be a good option For right now heart rate below 100 No indication for IV meds  Mild elevated troponin -no chest pain most likely in the setting of demand in the setting of acute CHF and A. fib with RVR  CKD -  -chronic avoid nephrotoxic medications such as NSAIDs, Vanco Zosyn combo,  avoid hypotension, continue to follow renal function   Other plan as per orders.  DVT prophylaxis: eliquis     Code Status:    Code Status: Prior FULL CODE  as per patient   I had personally discussed CODE STATUS with patient     Family Communication:   Family not at  Bedside   Disposition Plan:  To home once workup is complete and patient is stable   Following barriers for discharge:                            Electrolytes corrected                                                 Will need consultants to evaluate patient prior to discharge                       Would benefit from PT/OT eval prior to DC  Ordered                                       Consults called: Sent smart chat to Dr. Mariah Milling the cardiology  Admission status:  ED Disposition    ED Disposition Condition Comment   Admit  Hospital Area: [facility] [[id number]]  Level of Care: Med-Surg [16]  Covid Evaluation: Confirmed COVID Negative  Diagnosis: Acute on chronic combined systolic (  congestive) and diastolic (congestive) heart failure [facility]) [[id number]]  Admitting Physician: Therisa Doyne [[id number]]  Attending Physician: Therisa Doyne [[id number]]  Estimated length of stay: past midnight tomorrow  Certification:: I certify this patient will need inpatient services for at least 2 midnights         inpatient     I Expect 2 midnight stay secondary to severity of patient's current illness need for inpatient interventions justified by the following:  hemodynamic instability despite optimal treatment (tachycardia )   Severe lab/radiological/exam abnormalities including:   Elevated troponin A. fib with RVR evidence of stage  and extensive comorbidities including:  substance abuse     CHF    CKD .   Marland Kitchen Chronic anticoagulation  That are currently affecting medical management.   I expect  patient to be hospitalized for 2 midnights requiring inpatient medical care.  Patient is at high risk for adverse outcome (such as loss of life or disability) if not treated.  Indication for inpatient stay as follows:    Hemodynamic instability despite maximal medical therapy,      Need for IV diuretics   Level of care   tele  please discontinue once patient no longer qualifies COVID-19 Labs    Lab Results  Component Value Date   SARSCOV2NAA NEGATIVE 06/19/2020     Precautions: admitted as  Covid Negative     PPE: Used by the provider:    P100  eye Goggles,  Gloves    Ellisha Bankson 06/20/2020, 1:17 AM    [facility]     after 2 AM please page floor coverage PA If 7AM-7PM, please contact the day  team taking care of the patient using [email or web address]   Patient was evaluated in the context of the global COVID-19 pandemic, which necessitated consideration that the patient might be at risk for infection with the SARS-CoV-2 virus that causes COVID-19. Institutional protocols and algorithms that pertain to the evaluation of patients at risk for COVID-19 are in a state of rapid change based on information released by regulatory bodies including the CDC and federal and state organizations. These policies and algorithms were followed during the patient's care.

## 2020-06-19 NOTE — ED Triage Notes (Signed)
Pt comes into the ED via POV c/o SHOB that has been worse x 2 days.  Pt states he has history of a-fib and when he runs out of medication, he often gets [facility].  Pt able to speak in full sentences with even and unlabored respirations.  Pt just found his medication the other day, but he had been off of it for about a week.

## 2020-06-20 ENCOUNTER — Encounter: Payer: Self-pay | Admitting: Internal Medicine

## 2020-06-20 DIAGNOSIS — I4891 Unspecified atrial fibrillation: Secondary | ICD-10-CM

## 2020-06-20 DIAGNOSIS — Z5900 Homelessness unspecified: Secondary | ICD-10-CM

## 2020-06-20 DIAGNOSIS — Z720 Tobacco use: Secondary | ICD-10-CM

## 2020-06-20 DIAGNOSIS — I160 Hypertensive urgency: Secondary | ICD-10-CM

## 2020-06-20 DIAGNOSIS — I5023 Acute on chronic systolic (congestive) heart failure: Secondary | ICD-10-CM

## 2020-06-20 DIAGNOSIS — I110 Hypertensive heart disease with heart failure: Secondary | ICD-10-CM

## 2020-06-20 DIAGNOSIS — N1831 Chronic kidney disease, stage 3a: Secondary | ICD-10-CM

## 2020-06-20 LAB — COMPREHENSIVE METABOLIC PANEL
ALT: 23 U/L (ref 0–44)
AST: 23 U/L (ref 15–41)
Albumin: 3.4 g/dL — ABNORMAL LOW (ref 3.5–5.0)
Alkaline Phosphatase: 130 U/L — ABNORMAL HIGH (ref 38–126)
Anion gap: 7 (ref 5–15)
BUN: 18 mg/dL (ref 8–23)
CO2: 25 mmol/L (ref 22–32)
Calcium: 8.5 mg/dL — ABNORMAL LOW (ref 8.9–10.3)
Chloride: 108 mmol/L (ref 98–111)
Creatinine, Ser: 1.37 mg/dL — ABNORMAL HIGH (ref 0.61–1.24)
GFR, Estimated: 59 mL/min — ABNORMAL LOW (ref >60)
Glucose, Bld: 108 mg/dL — ABNORMAL HIGH (ref 70–99)
Potassium: 3.8 mmol/L (ref 3.5–5.1)
Sodium: 140 mmol/L (ref 135–145)
Total Bilirubin: 2.3 mg/dL — ABNORMAL HIGH (ref 0.3–1.2)
Total Protein: 6.6 g/dL (ref 6.5–8.1)

## 2020-06-20 LAB — CBC WITH DIFFERENTIAL/PLATELET
Abs Immature Granulocytes: 0.05 10*3/uL (ref 0.00–0.07)
Basophils Absolute: 0.1 10*3/uL (ref 0.0–0.1)
Basophils Relative: 1 %
Eosinophils Absolute: 0.1 10*3/uL (ref 0.0–0.5)
Eosinophils Relative: 1 %
HCT: 37.9 % — ABNORMAL LOW (ref 39.0–52.0)
Hemoglobin: 13.8 g/dL (ref 13.0–17.0)
Immature Granulocytes: 0 %
Lymphocytes Relative: 14 %
Lymphs Abs: 1.7 10*3/uL (ref 0.7–4.0)
MCH: 30.1 pg (ref 26.0–34.0)
MCHC: 36.4 g/dL — ABNORMAL HIGH (ref 30.0–36.0)
MCV: 82.6 fL (ref 80.0–100.0)
Monocytes Absolute: 0.9 10*3/uL (ref 0.1–1.0)
Monocytes Relative: 7 %
Neutro Abs: 9.5 10*3/uL — ABNORMAL HIGH (ref 1.7–7.7)
Neutrophils Relative %: 77 %
Platelets: 260 10*3/uL (ref 150–400)
RBC: 4.59 MIL/uL (ref 4.22–5.81)
RDW: 16.5 % — ABNORMAL HIGH (ref 11.5–15.5)
WBC: 12.3 10*3/uL — ABNORMAL HIGH (ref 4.0–10.5)
nRBC: 0 % (ref 0.0–0.2)

## 2020-06-20 LAB — BLOOD GAS, VENOUS

## 2020-06-20 LAB — URINE DRUG SCREEN, QUALITATIVE (ARMC ONLY)
Amphetamines, Ur Screen: NOT DETECTED
Barbiturates, Ur Screen: NOT DETECTED
Benzodiazepine, Ur Scrn: NOT DETECTED
Cannabinoid 50 Ng, Ur ~~LOC~~: NOT DETECTED
Cocaine Metabolite,Ur ~~LOC~~: NOT DETECTED
MDMA (Ecstasy)Ur Screen: NOT DETECTED
Methadone Scn, Ur: NOT DETECTED
Opiate, Ur Screen: NOT DETECTED
Phencyclidine (PCP) Ur S: NOT DETECTED
Tricyclic, Ur Screen: NOT DETECTED

## 2020-06-20 LAB — TROPONIN I (HIGH SENSITIVITY)
Troponin I (High Sensitivity): 28 ng/L — ABNORMAL HIGH (ref <18)
Troponin I (High Sensitivity): 29 ng/L — ABNORMAL HIGH (ref <18)

## 2020-06-20 LAB — TSH: TSH: 1.221 u[IU]/mL (ref 0.350–4.500)

## 2020-06-20 LAB — MAGNESIUM: Magnesium: 2 mg/dL (ref 1.7–2.4)

## 2020-06-20 MED ORDER — SODIUM CHLORIDE 0.9 % IV SOLN: 250.0000 mL | INTRAVENOUS | Status: DC | PRN

## 2020-06-20 MED ORDER — APIXABAN 5 MG PO TABS
5.0000 mg | ORAL_TABLET | Freq: Two times a day (BID) | ORAL | Status: DC
  Administered 2020-06-20 – 2020-06-23 (×7): 5 mg via ORAL
  Filled 2020-06-20 (×7): qty 1

## 2020-06-20 MED ORDER — ISOSORBIDE MONONITRATE ER 30 MG PO TB24
30.0000 mg | ORAL_TABLET | Freq: Every day | ORAL | Status: DC
  Administered 2020-06-20 – 2020-06-23 (×4): 30 mg via ORAL
  Filled 2020-06-20 (×4): qty 1

## 2020-06-20 MED ORDER — SODIUM CHLORIDE 0.9% FLUSH
3.0000 mL | INTRAVENOUS | Status: DC | PRN
  Administered 2020-06-21: 3 mL via INTRAVENOUS

## 2020-06-20 MED ORDER — ATORVASTATIN CALCIUM 20 MG PO TABS
80.0000 mg | ORAL_TABLET | Freq: Every day | ORAL | Status: DC
  Administered 2020-06-20 – 2020-06-23 (×4): 80 mg via ORAL
  Filled 2020-06-20 (×4): qty 4

## 2020-06-20 MED ORDER — CARVEDILOL 25 MG PO TABS
25.0000 mg | ORAL_TABLET | Freq: Two times a day (BID) | ORAL | Status: DC
  Administered 2020-06-20 – 2020-06-23 (×7): 25 mg via ORAL
  Filled 2020-06-20 (×7): qty 1

## 2020-06-20 MED ORDER — FUROSEMIDE 10 MG/ML IJ SOLN
40.0000 mg | Freq: Two times a day (BID) | INTRAMUSCULAR | Status: DC
  Administered 2020-06-20 – 2020-06-21 (×4): 40 mg via INTRAVENOUS
  Filled 2020-06-20 (×4): qty 4

## 2020-06-20 MED ORDER — NICOTINE 14 MG/24HR TD PT24
14.0000 mg | MEDICATED_PATCH | Freq: Every day | TRANSDERMAL | Status: DC
  Administered 2020-06-20 – 2020-06-23 (×4): 14 mg via TRANSDERMAL
  Filled 2020-06-20 (×5): qty 1

## 2020-06-20 MED ORDER — SODIUM CHLORIDE 0.9% FLUSH
3.0000 mL | Freq: Two times a day (BID) | INTRAVENOUS | Status: DC
  Administered 2020-06-20 – 2020-06-22 (×6): 3 mL via INTRAVENOUS

## 2020-06-20 MED ORDER — HYDRALAZINE HCL 50 MG PO TABS
50.0000 mg | ORAL_TABLET | Freq: Three times a day (TID) | ORAL | Status: DC
  Administered 2020-06-20 – 2020-06-23 (×11): 50 mg via ORAL
  Filled 2020-06-20 (×11): qty 1

## 2020-06-20 MED ORDER — ASPIRIN EC 81 MG PO TBEC
81.0000 mg | DELAYED_RELEASE_TABLET | Freq: Every day | ORAL | Status: DC
  Administered 2020-06-20: 11:00:00 81 mg via ORAL
  Filled 2020-06-20: qty 1

## 2020-06-20 MED ORDER — LOSARTAN POTASSIUM 50 MG PO TABS
50.0000 mg | ORAL_TABLET | Freq: Every day | ORAL | Status: DC
  Administered 2020-06-20 – 2020-06-23 (×4): 50 mg via ORAL
  Filled 2020-06-20 (×4): qty 1

## 2020-06-20 MED ORDER — ACETAMINOPHEN 325 MG PO TABS: 650.0000 mg | ORAL_TABLET | ORAL | Status: DC | PRN

## 2020-06-20 MED ORDER — HYDROCODONE-ACETAMINOPHEN 5-325 MG PO TABS
1.0000 | ORAL_TABLET | ORAL | Status: DC | PRN
  Administered 2020-06-20: 1 via ORAL
  Filled 2020-06-20: qty 1

## 2020-06-20 MED ORDER — ONDANSETRON HCL 4 MG/2ML IJ SOLN: 4.0000 mg | Freq: Four times a day (QID) | INTRAMUSCULAR | Status: DC | PRN

## 2020-06-20 NOTE — Evaluation (Addendum)
Physical Therapy Evaluation Patient Details Name: Gregory Howell MRN: [id number] DOB: [birth year] Today's Date: 06/20/2020   History of Present Illness  Patient is a 59 y.o. male with medical history significant of atrial fibrillation, chronic combined systolic diastolic CHF, CKD, alcohol abuse, hypertension, tobacco abuse. Patient presents with shortness of breath, worsening lower extremity edema, orthopena, CHF exacerbation and A. fib with RVR.  Clinical Impression  PT evaluation completed. Patient reports he usually ambulates with a walking stick due to chronic left knee issues. Patient has generalized weakness in BLE and is fatigued with mild shortness of breath noted with activity. Heart rate 90-110bpm and respiration rate 21-32 breaths per minute during activity. Blood pressure 140/77 mmHg after walking. Patient ambulated in room and around the unit with single point cane with supervision, and one bouts of staggering to right that required Min guard assistance towards end of ambulation bout. Patient reports he is homeless and is unsure of his discharge destination. Recommend to continue PT to address functional limitations listed below and maximize independence. Outpatient PT recommended at discharge for endurance, strengthening, high level balance, and gait training.     Follow Up Recommendations Outpatient PT    Equipment Recommendations  Cane    Recommendations for Other Services       Precautions / Restrictions Precautions Precautions: None Restrictions Weight Bearing Restrictions: No      Mobility  Bed Mobility               General bed mobility comments: Not assessed as patient sitting up on arrival and post session     Transfers Overall transfer level: Needs assistance Equipment used: Straight cane Transfers: Sit to/from Stand Sit to Stand: Supervision         General transfer comment: Supervision for safety   Ambulation/Gait Ambulation/Gait assistance:  Supervision;Min guard Gait Distance (Feet): 175 Feet Assistive device: Straight cane Gait Pattern/deviations: Decreased stance time - left;Decreased step length - left;Antalgic;Staggering right Gait velocity: decreased   General Gait Details: Patient ambulated in room and around unit with single point cane used in RUE. Patient has chronic left knee issues and reports left knee intermittently buckles. Intermittent tactile cues for use of cane. Supervision for safety with one bout of Min guard assistance with staggering to right towards end of ambulation bout. Mild shortness of breath noted. Heart rate 90-110bpm and respiration rate 21-32 breaths per minute during activity. Education also provided for energy conservation and breathing techniques   Stairs            Wheelchair Mobility    Modified Rankin (Stroke Patients Only)       Balance Overall balance assessment: Needs assistance Sitting-balance support: Feet supported Sitting balance-Leahy Scale: Good Sitting balance - Comments: Good sitting balance with reaching outside base of support    Standing balance support: Single extremity supported Standing balance-Leahy Scale: Fair Standing balance comment: staggering noted x 1 bout during functional ambulation that required Min guard assistance. patient relying on cane for UE support in standing position                              Pertinent Vitals/Pain Pain Assessment: No/denies pain    Home Living Family/patient expects to be discharged to:: Shelter/Homeless                 Additional Comments: Patient reports he is currently homeless and has been staying some at his ex-wife's home where there are  17 steps to get to the bathoom     Prior Function Level of Independence: Independent with assistive device(s)         Comments: Patient uses a walking stick for ambulation and reports he was supposed to get a single point cane at the [facility] and never  received one. He reports left knee gives out at tims, but no recent falls      Hand Dominance        Extremity/Trunk Assessment   Upper Extremity Assessment Upper Extremity Assessment: Overall WFL for tasks assessed    Lower Extremity Assessment Lower Extremity Assessment: RLE deficits/detail;LLE deficits/detail RLE Deficits / Details: 4+/5 dorsiflexion, plantarflexion, knee extension  RLE Sensation: WNL LLE Deficits / Details: 4+/5 dorsiflexion, plantarflexion. 4/5 knee extension  LLE Sensation: WNL       Communication   Communication: No difficulties  Cognition Arousal/Alertness: Awake/alert Behavior During Therapy: WFL for tasks assessed/performed Overall Cognitive Status: Within Functional Limits for tasks assessed                                        General Comments      Exercises     Assessment/Plan    PT Assessment Patient needs continued PT services  PT Problem List Decreased strength;Decreased activity tolerance;Decreased balance;Decreased mobility;Decreased safety awareness;Decreased knowledge of use of DME;Cardiopulmonary status limiting activity       PT Treatment Interventions DME instruction;Gait training;Stair training;Functional mobility training;Therapeutic activities;Therapeutic exercise;Balance training;Patient/family education    PT Goals (Current goals can be found in the Care Plan section)  Acute Rehab PT Goals Patient Stated Goal: to get a cane and have less shortness of breath  PT Goal Formulation: With patient Time For Goal Achievement: 07/04/20 Potential to Achieve Goals: Good    Frequency Min 2X/week   Barriers to discharge  (patient is homeless)      Co-evaluation               AM-PAC PT "6 Clicks" Mobility  Outcome Measure Help needed turning from your back to your side while in a flat bed without using bedrails?: None Help needed moving from lying on your back to sitting on the side of a flat bed  without using bedrails?: A Little Help needed moving to and from a bed to a chair (including a wheelchair)?: A Little Help needed standing up from a chair using your arms (e.g., wheelchair or bedside chair)?: A Little Help needed to walk in hospital room?: A Little Help needed climbing 3-5 steps with a railing? : A Little 6 Click Score: 19    End of Session Equipment Utilized During Treatment: Gait belt Activity Tolerance: Patient limited by fatigue Patient left: in chair;with call bell/phone within reach Nurse Communication: Mobility status PT Visit Diagnosis: Other abnormalities of gait and mobility (R26.89);Muscle weakness (generalized) (M62.81)    Time: 0076-2263 PT Time Calculation (min) (ACUTE ONLY): 27 min   Charges:   PT Evaluation $PT Eval Moderate Complexity: 1 Mod PT Treatments $Gait Training: 8-22 mins        Donna Bernard, PT, MPT   Ina Homes 06/20/2020, 10:11 AM

## 2020-06-20 NOTE — Progress Notes (Signed)
° °  Heart Failure Nurse Navigator Note  HFrEF 25-30 % by echocardiogram performed in April of 2021.  Also showed moderate LVH, right ventricular systolic function severely decreased.  Moderately elevated pulmonary arterial systolic pressure. Mild bi-atrial enlargement.  Mild MR.  Co morbidities:  Non obstructive coronary artery disease Hypertension Persistent Atrial fibrillation CKD stage II-III  Tobacco and alcohol abuse   Medications:  Eliquis 5 mg BID ASA 81 mg daily Lipitor 80 mg daily Coreg 25 mg BID Lasix 40 mg IV BID Hydralaznie 50 mg every 8 hours Imdur 30 mg daily Cozaar 50  mg daily   Labs:  BNP 1,063, sodium 140, potassium 3.8, BUN 18, creatinine 1.37, TSH 1.22, magnesium 2.0, troponin 29.  BMI 31.7 BP 135/103  Intake not documented Output 350 ml  Weight 97.4 kg   Assessment:  General- he is awake and alert,states his lower back is causing him some pain.  HEENT-pupil equal non icteric, no JVD  Cardiac- irregular, no murmurs or rubs noted.  Chest- breath sounds clear to posterior  Auscultation.  Abdomen- firm, non tender  Musculoskeletal- trace edema  Psych is pleasant and appropriate.  Neuro-moves all extremities, speech is clear.   Discussed his heart failure and how he takes care of himself.  He currently does not have a working scale.  He is currently homeless, if his wife leaves her car unlocked he some nights sleeps there.  Discussed his diet, he mostly is eating things that are not good for him, fast foods, convenience foods, some days his daughter and son may give him something to eat, then there are days that he may not eat anything at all.  He was instructed on fluid restriction on previous hospitalization, feels that he does not drink any more than the 64 ounces.  Given heart failure teaching booklet and zone magnet.  Plan to revisit tomorrow.   Tresa Endo RN,CHFN

## 2020-06-20 NOTE — Plan of Care (Signed)

## 2020-06-20 NOTE — Progress Notes (Signed)
PROGRESS NOTE    Gregory Howell  QHU:[id number] DOB: [birth year] DOA: 06/19/2020 [facility]CP: [facility]    Assessment & Plan:   Principal Problem:   Acute on chronic HFrEF (heart failure with reduced ejection fraction) (HCC) Active Problems:   Hypertensive urgency   Tobacco abuse   Leukocytosis   Atrial fibrillation with RVR (HCC)   CKD (chronic kidney disease), stage III (HCC)    Gregory Howell is a 59 y.o. male with medical history significant of atrial fibrillation, chronic combined systolic diastolic CHF, CKD, alcohol abuse, hypertension, tobacco abuse, who presented with shortness of breath for past 2 days.   # Acute on chronic systolic and diastolic CHF exacerbation # NICM, RV dysfunction, pulm HTN # Medication non-compliance --Presented with dyspnea, with abdominal distension, BNP 1063.  No hypoxia.   --Last TTE on 04/05/20 showed LVEF 40-45%. --Pt was confused about whether or not he was supposed to take water pills at home. --started on IV lasix on presentation. PLAN:  --continue IV lasix 40 mg BID --Strict I/O --cont coreg and losartan  # Nonobstructive CAD # Mild trop elevation due to demand ischemia --no chest pain.  Recent LHC in 11/2019 showed nonobstructive CAD. -cont coreg, losartan, Imdur and Lipitor  # Persistent Afib # Acquired thrombophilia --rate controlled --cont Coreg --cont Eliquis  # HTN --cont coreg, losartan, Imdur, hydralazine --IV Lasix for diuresis  # CKD stage 2 --Monitor Cr with diuresis  # Tobacco use --nicotine patch  # Homelessness --TOC [facility] --[facility] provided by [facility]   DVT prophylaxis: WS:[id number] Code Status: Full code  Family Communication:  Status is: inpatient Dispo:   The patient is from: streets Anticipated d/c is to: [facility] Anticipated d/c date is: 1-2 days Patient currently is not medically stable to d/c due to: on IV lasix, per cardiology rec   Subjective  and Interval History:  Pt reported feeling better.  Pt provided different hx as to what meds he was taking at home or whether he was taking them.  Complained of dyspnea, but no swelling.  [place] Spaniel that he is currently homeless.   Objective: Vitals:   06/20/20 0804 06/20/20 0950 06/20/20 1129 06/20/20 1521  BP: (!) 129/99 140/77 (!) 135/103 (!) 127/93  Pulse: 91 92 68 89  Resp: 17 20 18 17   Temp: 97.9 F (36.6 C)  97.8 F (36.6 C) 97.6 F (36.4 C)  TempSrc: Oral  Oral   SpO2: 98%  98% 95%  Weight:      Height:        Intake/Output Summary (Last 24 hours) at 06/20/2020 1646 Last data filed at 06/20/2020 1434 Gross per 24 hour  Intake 480 ml  Output 750 ml  Net -270 ml   Filed Weights   06/19/20 1638 06/20/20 0500  Weight: 97.5 kg 97.4 kg    Examination:   Constitutional: NAD, AAOx3, walking around with a cane HEENT: conjunctivae and lids normal, EOMI CV: No cyanosis.   RESP: normal respiratory effort, on RA Extremities: No effusions, edema in BLE SKIN: warm, dry and intact Neuro: II - XII grossly intact.   Psych: Normal mood and affect.      Data Reviewed: I have personally reviewed following labs and imaging studies  CBC: Recent Labs  Lab 06/19/20 1640 06/20/20 0437  WBC 13.4* 12.3*  NEUTROABS  --  9.5*  HGB 15.2 13.8  HCT 42.5 37.9*  MCV 82.2 82.6  PLT 297 260  Basic Metabolic Panel: Recent Labs  Lab 06/19/20 1640 06/20/20 0437  NA 141 140  K 4.2 3.8  CL 108 108  CO2 24 25  GLUCOSE 106* 108*  BUN 18 18  CREATININE 1.34* 1.37*  CALCIUM 8.7* 8.5*  MG  --  2.0   GFR: Estimated Creatinine Clearance: 65.2 mL/min (A) (by C-G formula based on SCr of 1.37 mg/dL (H)). Liver Function Tests: Recent Labs  Lab 06/19/20 2118 06/20/20 0437  AST 30 23  ALT 28 23  ALKPHOS 138* 130*  BILITOT 2.9* 2.3*  PROT 7.6 6.6  ALBUMIN 3.9 3.4*   No results for input(s): LIPASE, AMYLASE in the last 168 hours. No results for input(s): AMMONIA in the last 168  hours. Coagulation Profile: No results for input(s): INR, PROTIME in the last 168 hours. Cardiac Enzymes: No results for input(s): CKTOTAL, CKMB, CKMBINDEX, TROPONINI in the last 168 hours. BNP (last 3 results) No results for input(s): PROBNP in the last 8760 hours. HbA1C: No results for input(s): HGBA1C in the last 72 hours. CBG: No results for input(s): GLUCAP in the last 168 hours. Lipid Profile: No results for input(s): CHOL, HDL, LDLCALC, TRIG, CHOLHDL, LDLDIRECT in the last 72 hours. Thyroid Function Tests: Recent Labs    06/20/20 0437  TSH 1.221   Anemia Panel: No results for input(s): VITAMINB12, FOLATE, FERRITIN, TIBC, IRON, RETICCTPCT in the last 72 hours. Sepsis Labs: No results for input(s): PROCALCITON, LATICACIDVEN in the last 168 hours.  Recent Results (from the past 240 hour(s))  Respiratory Panel by RT PCR (Flu A&B, Covid) - Nasopharyngeal Swab     Status: None   Collection Time: 06/19/20  9:18 PM   Specimen: Nasopharyngeal Swab  Result Value Ref Range Status   SARS Coronavirus 2 by RT PCR NEGATIVE NEGATIVE Final    Comment: (NOTE) SARS-CoV-2 target nucleic acids are NOT DETECTED.  The SARS-CoV-2 RNA is generally detectable in upper respiratoy specimens during the acute phase of infection. The lowest concentration of SARS-CoV-2 viral copies this assay can detect is 131 copies/mL. A negative result does not preclude SARS-Cov-2 infection and should not be used as the sole basis for treatment or other patient management decisions. A negative result may occur with  improper specimen collection/handling, submission of specimen other than nasopharyngeal swab, presence of viral mutation(s) within the areas targeted by this assay, and inadequate number of viral copies (<131 copies/mL). A negative result must be combined with clinical observations, patient history, and epidemiological information. The expected result is Negative.  Fact Sheet for Patients:   https://www.moore.com/  Fact Sheet for Healthcare Providers:  https://www.young.biz/  This test is no t yet approved or cleared by the [place] FDA and  has been authorized for detection and/or diagnosis of SARS-CoV-2 by FDA under an Emergency Use Authorization (EUA). This EUA will remain  in effect (meaning this test can be used) for the duration of the COVID-19 declaration under Section 564(b)(1) of the Act, 21 U.S.C. section 360bbb-3(b)(1), unless the authorization is terminated or revoked sooner.     Influenza A by PCR NEGATIVE NEGATIVE Final   Influenza B by PCR NEGATIVE NEGATIVE Final    Comment: (NOTE) The Xpert Xpress SARS-CoV-2/FLU/RSV assay is intended as an aid in  the diagnosis of influenza from Nasopharyngeal swab specimens and  should not be used as a sole basis for treatment. Nasal washings and  aspirates are unacceptable for Xpert Xpress SARS-CoV-2/FLU/RSV  testing.  Fact Sheet for Patients: https://www.moore.com/  Fact Sheet for Healthcare  Providers: https://www.young.biz/  This test is not yet approved or cleared by the [place] and  has been authorized for detection and/or diagnosis of SARS-CoV-2 by  FDA under an Emergency Use Authorization (EUA). This EUA will remain  in effect (meaning this test can be used) for the duration of the  Covid-19 declaration under Section 564(b)(1) of the Act, 21  U.S.C. section 360bbb-3(b)(1), unless the authorization is  terminated or revoked. Performed at [facility], [address] [place], [place] [address]       Radiology Studies: DG Chest 2 View  Result Date: 06/19/2020 CLINICAL DATA:  Worsening shortness of breath for 2 days, atrial fibrillation EXAM: CHEST - 2 VIEW COMPARISON:  04/04/2020 FINDINGS: Frontal and lateral views of the chest demonstrates stable enlargement of the cardiac silhouette. Increased  central vascular congestion and interstitial prominence compatible with edema. Trace bilateral pleural effusions. No pneumothorax. No acute bony abnormalities. IMPRESSION: 1. Developing interstitial edema with small bilateral pleural effusions. Electronically Signed   By: Sharlet Salina M.D.   On: 06/19/2020 17:44     Scheduled Meds: . apixaban  5 mg Oral BID  . atorvastatin  80 mg Oral Daily  . carvedilol  25 mg Oral BID WC  . furosemide  40 mg Intravenous BID  . hydrALAZINE  50 mg Oral Q8H  . isosorbide mononitrate  30 mg Oral Daily  . losartan  50 mg Oral Daily  . nicotine  14 mg Transdermal Daily  . sodium chloride flush  3 mL Intravenous Q12H   Continuous Infusions: . sodium chloride       LOS: 1 day     Darlin Priestly, MD [facility] If 7PM-7AM, please contact night-coverage 06/20/2020, 4:46 PM

## 2020-06-20 NOTE — Consult Note (Signed)
Cardiology Consultation:   Patient ID: Gregory Howell; [id number]; [birth year]   Admit date: 06/19/2020 Date of Consult: 06/20/2020  Primary Care Provider: [facility] Primary Cardiologist: Fletcher Anon Primary Electrophysiologist:  None   Patient Profile:   Daquann Merriott is a 59 y.o. male with a hx of nonobstructive CAD by [facility] in 11/2019, HFrEF secondary to NICM, pulmonary hypertension, persistent Afib on Eliquis, hypertensive heart disease, CKD stage II-III, prior medical nonadherence, and alcohol/tobacco use who is being seen today for the evaluation of acute on chronic HFrEF, elevated troponin, and persistent Afib with RVR at the request of Dr. Roel Cluck.   History of Present Illness:   Mr. Lovering was initially consulted on by our group during an admission in 2018 for atypical chest pain, ruled out and underwent a nuclear stress test that was low risk.  Optimization of his blood pressure was advised. He was admitted in 11/2019 with acute HFrEF and new onset Afib. Echo at that time showed an EF of 25-30%, global hypokinesis, indeterminate diastolic function parameters, severely reduced RVSF with normal RV cavity size, mild biatrial enlargement, right atrial pressure 15 mmHg, and PASP 64.8 mmHg. Diagnostic R/LHC showed nonobstructive CAD with mildly elevated right heart pressure and PCWP as outlined below. He was diuresed and medically optimized. With regards to his Afib, he was rate controlled with recommendation to follow up after he was adequately anticoagulated for consideration of DCCV. He was lost to follow up with our office. He was admitted in 03/2020 with acute on chronic HFrEF likely exacerbated by elevated blood pressure. Echo showed an EF of 40-45% with hypokinesis of the basal region, moderate LVH, indeterminate diastolic function parameters, mildly reduced RVSF with mildly enlarged RV cavity size, mildly elevated PASP estimated at 37.5 mmHg, severely dilated left atrium, and  a moderately dilated right atrium. Continued rate control strategy was advised given he was asymptomatic and dilated left atrium.   Following each of his hospital admissions, he has been lost to follow up with our office. He does report following up with the [facility], though does miss some appointments as he is homeless and does not have a car.  He returned to [facility] on 11/10 with 1 week history of worsening SOB, PND, orthopnea, and abdominal swelling. He had misplaced his medications for approximately 2 weeks leading up to development of symptoms. He is also uncertain if he has been taking or has a diuretic in the outpatient setting. No chest pain, lower extremity swelling, dizziness, presyncope, or syncope.   Upon the patient's arrival to [facility] they were found to have BP in the 160s/130s currently improved to the 914N to 829F systolic, HR 62Z to 308M bpm, temp afebrile, oxygen saturation 96% on room air, weight 97.5 kg. EKG showed coarse Afib with RVR, 113 bpm, nonspecific st/t changes, CXR showed developing interstitial edema with small bilateral pleural effusions. Labs showed an initial HS-Tn of 44 with a delta troponin 34 that has continued to down trend, BNP 1063, UDS negative, ethanol <10, covid negative, WBC 13.4, HGB 15.2, PLT 297, TSH normal, magnesium 2.0, T bili 2.9, alk phos 138. In the ED, she received IV Lasix 40 mg for diuresis along with IV metoprolol for heart rate control, and Coreg , hydralazine, Imdur, and losartan for BP. He was continued on Eliquis. Upon admission, he was placed on IV Lasix 40 mg bid. Documented UOP of 350 mL for the past 24 hours with a net - 510 mL  for the admission. Weight 97.5-->97.4 kg. Currently, BP has improved into the 1-teens systolic. He does feel his dyspnea has improved some, though his abdominal distension persists.     Past Medical History:  Diagnosis Date  . CKD (chronic kidney disease), stage II   . Coronary artery disease, non-occlusive   .  HFrEF (heart failure with reduced ejection fraction) ([place])   . Hypertensive heart disease   . NICM (nonischemic cardiomyopathy) ([place])   . Persistent atrial fibrillation [facility])     Past Surgical History:  Procedure Laterality Date  . RIGHT/LEFT HEART CATH AND CORONARY ANGIOGRAPHY N/A 12/07/2019   Procedure: RIGHT/LEFT HEART CATH AND CORONARY ANGIOGRAPHY;  Surgeon: Minna Merritts, MD;  Location: [facility] CV [facility];  Service: Cardiovascular;  Laterality: N/A;     Home Meds: Prior to Admission medications   Medication Sig Start Date End Date Taking? Authorizing Provider  apixaban (ELIQUIS) 5 MG TABS tablet Take 1 tablet (5 mg total) by mouth 2 (two) times daily. 12/08/19  Yes Samuella Cota, MD  aspirin EC 81 MG EC tablet Take 1 tablet (81 mg total) by mouth daily. 11/03/16  Yes Sudini, Alveta Heimlich, MD  atorvastatin (LIPITOR) 80 MG tablet Take 1 tablet (80 mg total) by mouth daily. 12/08/19  Yes Samuella Cota, MD  carvedilol (COREG) 25 MG tablet Take 1 tablet (25 mg total) by mouth 2 (two) times daily with a meal. 12/08/19  Yes Samuella Cota, MD  furosemide (LASIX) 40 MG tablet Take 1 tablet (40 mg total) by mouth daily. On Monday, Wednesday, Friday, take 40 mg TWICE daily. 04/07/20  Yes Nicole Kindred A, DO  hydrALAZINE (APRESOLINE) 50 MG tablet Take 1 tablet (50 mg total) by mouth every 8 (eight) hours. 12/08/19  Yes Samuella Cota, MD  isosorbide mononitrate (IMDUR) 30 MG 24 hr tablet Take 1 tablet (30 mg total) by mouth daily. 04/08/20  Yes Nicole Kindred A, DO  losartan (COZAAR) 50 MG tablet Take 1 tablet (50 mg total) by mouth daily. 04/08/20  Yes Nicole Kindred A, DO  methocarbamol (ROBAXIN) 500 MG tablet Take 1 tablet (500 mg total) by mouth every 8 (eight) hours as needed for muscle spasms (cramping). 04/07/20  Yes Ezekiel Slocumb, DO    Inpatient Medications: Scheduled Meds: . apixaban  5 mg Oral BID  . aspirin EC  81 mg Oral Daily  . atorvastatin  80 mg Oral Daily  .  carvedilol  25 mg Oral BID WC  . furosemide  40 mg Intravenous BID  . hydrALAZINE  50 mg Oral Q8H  . isosorbide mononitrate  30 mg Oral Daily  . losartan  50 mg Oral Daily  . nicotine  14 mg Transdermal Daily  . sodium chloride flush  3 mL Intravenous Q12H   Continuous Infusions: . sodium chloride     PRN Meds: sodium chloride, acetaminophen, nitroGLYCERIN, ondansetron (ZOFRAN) IV, sodium chloride flush  Allergies:  No Known Allergies  Social History:   Social History   Socioeconomic History  . Marital status: Married    Spouse name: Not on file  . Number of children: Not on file  . Years of education: Not on file  . Highest education level: Not on file  Occupational History  . Not on file  Tobacco Use  . Smoking status: Current Every Day Smoker    Packs/day: 0.50    Types: Cigarettes  . Smokeless tobacco: Never Used  Substance and Sexual Activity  . Alcohol use: Yes  Alcohol/week: 7.0 - 14.0 standard drinks    Types: 7 - 14 Cans of beer per week    Comment: daily  . Drug use: Never  . Sexual activity: Not on file  Other Topics Concern  . Not on file  Social History Narrative  . Not on file   Social Determinants of Health   Financial Resource Strain:   . Difficulty of Paying Living Expenses: Not on file  Food Insecurity:   . Worried About Charity fundraiser in the Last Year: Not on file  . Ran Out of Food in the Last Year: Not on file  Transportation Needs:   . Lack of Transportation (Medical): Not on file  . Lack of Transportation (Non-Medical): Not on file  Physical Activity:   . Days of Exercise per Week: Not on file  . Minutes of Exercise per Session: Not on file  Stress:   . Feeling of Stress : Not on file  Social Connections:   . Frequency of Communication with Friends and Family: Not on file  . Frequency of Social Gatherings with Friends and Family: Not on file  . Attends Religious Services: Not on file  . Active Member of Clubs or  Organizations: Not on file  . Attends Archivist Meetings: Not on file  . Marital Status: Not on file  Intimate Partner Violence:   . Fear of Current or Ex-Partner: Not on file  . Emotionally Abused: Not on file  . Physically Abused: Not on file  . Sexually Abused: Not on file     Family History:   Family History  Problem Relation Age of Onset  . Hypertension Mother   . Hypertension Father   . CAD Brother   . Diabetes Brother     ROS:  Review of Systems  Constitutional: Positive for malaise/fatigue. Negative for chills, diaphoresis, fever and weight loss.  HENT: Negative for congestion.   Eyes: Negative for discharge and redness.  Respiratory: Positive for shortness of breath. Negative for cough, sputum production and wheezing.   Cardiovascular: Positive for orthopnea. Negative for chest pain, palpitations, claudication, leg swelling and PND.  Gastrointestinal: Negative for abdominal pain, blood in stool, heartburn, melena, nausea and vomiting.  Musculoskeletal: Positive for back pain. Negative for falls and myalgias.  Skin: Negative for rash.  Neurological: Positive for weakness. Negative for dizziness, tingling, tremors, sensory change, speech change, focal weakness and loss of consciousness.  Endo/Heme/Allergies: Does not bruise/bleed easily.  Psychiatric/Behavioral: Negative for substance abuse. The patient is not nervous/anxious.   All other systems reviewed and are negative.     Physical Exam/Data:   Vitals:   06/20/20 0338 06/20/20 0500 06/20/20 0804 06/20/20 0950  BP:   (!) 129/99 140/77  Pulse:   91 92  Resp:   17 20  Temp:   97.9 F (36.6 C)   TempSrc:   Oral   SpO2: 93%  98%   Weight:  97.4 kg    Height:        Intake/Output Summary (Last 24 hours) at 06/20/2020 1043 Last data filed at 06/20/2020 1034 Gross per 24 hour  Intake 240 ml  Output 550 ml  Net -310 ml   Filed Weights   06/19/20 1638 06/20/20 0500  Weight: 97.5 kg 97.4 kg    Body mass index is 31.71 kg/m.   Physical Exam: General: Well developed, well nourished, in no acute distress. Head: Normocephalic, atraumatic, sclera non-icteric, no xanthomas, nares without discharge.  Neck: Negative for  carotid bruits. JVD not elevated. Lungs: Clear bilaterally to auscultation without wheezes, rales, or rhonchi. Breathing is unlabored. Heart: RRR with S1 S2. No murmurs, rubs, or gallops appreciated. Abdomen: Soft, non-tender, distended with normoactive bowel sounds. No hepatomegaly. No rebound/guarding. No obvious abdominal masses. Msk:  Strength and tone appear normal for age. Extremities: No clubbing or cyanosis. No edema. Distal pedal pulses are 2+ and equal bilaterally. Neuro: Alert and oriented X 3. No facial asymmetry. No focal deficit. Moves all extremities spontaneously. Psych:  Responds to questions appropriately with a normal affect.   EKG:  The EKG was personally reviewed and demonstrates: coarse Afib with RVR, 113 bpm, nonspecific st/t changes Telemetry:  Telemetry was personally reviewed and demonstrates: not on telemetry   Weights: Filed Weights   06/19/20 1638 06/20/20 0500  Weight: 97.5 kg 97.4 kg    Relevant CV Studies:  Nuclear stress test 11/02/2016:  Defect 1: There is a small defect of mild severity present in the apex location. This is likely due to apical thinning artifact.  The study is normal.  This is a low risk study.  Nuclear stress EF: 50%. The left ventricle is mildly dilated. __________  2D echo 12/04/2019: 1. Left ventricular ejection fraction, by estimation, is 25 to 30%. The  left ventricle has severely decreased function. The left ventricle  demonstrates global hypokinesis. There is moderate left ventricular  hypertrophy. Left ventricular diastolic  parameters are indeterminate.  2. Right ventricular systolic function is severely reduced. The right  ventricular size is normal. There is moderately elevated pulmonary  artery  systolic pressure. The estimated right ventricular systolic pressure is  16.1 mmHg.  3. Left atrial size was mildly dilated.  4. Right atrial size was mildly dilated.  5. The mitral valve is normal in structure. Mild mitral valve  regurgitation.  6. The aortic valve was not well visualized. Aortic valve regurgitation  is not visualized. No aortic stenosis is present.  7. The inferior vena cava is dilated in size with <50% respiratory  variability, suggesting right atrial pressure of 15 mmHg. __________  [facility] 12/07/2019: Coronary dominance: Right  Left mainstem:   Large vessel that bifurcates into the LAD and left circumflex, no significant disease noted  Left anterior descending (LAD): Large vessel that extends to the apical region, diagonal branch 2 of moderate size, no significant disease noted, mild diffuse disease LAD, diagonal vessels  Left circumflex (LCx):  Large vessel with OM branch 3, OM1 with mild 40% proximal disease, OM 2 is essentially occluded in the proximal region with collaterals/small, atretic, OM 3 has 60% mid vessel disease  Right coronary artery (RCA):  Right dominant vessel with PL and PDA, 50% ostial disease, 30% proximal to mid vessel disease, 60% PL branch disease  Left ventriculography: Left ventricular systolic function is severely depressed, mildly dilated LV, LVEF is estimated at 25%, no significant aortic valve stenosis Mild to moderate MR  Right heart pressures Howell 10 mmHg RV 45/5, 8 PA 47/27, mean 36 Wedge pressure mean 20 LV 137/13 Ao 129/91  Cardiac output 3.31 cardiac index 1.58   Final Conclusions:   Severely depressed LV function estimated 25%, Nonobstructive coronary disease Coronary disease out of proportion to depressed ejection fraction -Mildly elevated right heart pressures and wedge   Recommendations:  Continue carvedilol, hydralazine, isosorbide Aspirin, statin Smoking cessation recommended Alcohol cessation  recommended (recently moved from "beer to brown liquor") --Start low-dose losartan tomorrow Transition to [facility] as an outpatient -Blood pressure continues to run high, will  increase isosorbide up to 30 daily __________  2D echo 04/05/2020: 1. Left ventricular ejection fraction, by estimation, is 40 to 45%. The  left ventricle has mildly decreased function. Hypokinesis of basal  regions.There is moderate left ventricular hypertrophy. Left ventricular  diastolic parameters are indeterminate.  2. Right ventricular systolic function is mildly reduced. The right  ventricular size is mildly enlarged. There is mildly elevated pulmonary  artery systolic pressure.  3. Left atrial size was severely dilated.  4. Right atrial size was moderately dilated.  Laboratory Data:  Chemistry Recent Labs  Lab 06/19/20 1640 06/20/20 0437  NA 141 140  K 4.2 3.8  CL 108 108  CO2 24 25  GLUCOSE 106* 108*  BUN 18 18  CREATININE 1.34* 1.37*  CALCIUM 8.7* 8.5*  GFRNONAA >60 59*  ANIONGAP 9 7    Recent Labs  Lab 06/19/20 2118 06/20/20 0437  PROT 7.6 6.6  ALBUMIN 3.9 3.4*  AST 30 23  ALT 28 23  ALKPHOS 138* 130*  BILITOT 2.9* 2.3*   Hematology Recent Labs  Lab 06/19/20 1640 06/20/20 0437  WBC 13.4* 12.3*  RBC 5.17 4.59  HGB 15.2 13.8  HCT 42.5 37.9*  MCV 82.2 82.6  MCH 29.4 30.1  MCHC 35.8 36.4*  RDW 17.2* 16.5*  PLT 297 260   Cardiac EnzymesNo results for input(s): TROPONINI in the last 168 hours. No results for input(s): TROPIPOC in the last 168 hours.  BNP Recent Labs  Lab 06/19/20 1640  BNP 1,063.1*    DDimer No results for input(s): DDIMER in the last 168 hours.  Radiology/Studies:  DG Chest 2 View  Result Date: 06/19/2020 IMPRESSION: 1. Developing interstitial edema with small bilateral pleural effusions. Electronically Signed   By: Randa Ngo M.D.   On: 06/19/2020 17:44    Assessment and Plan:   1. Acute on chronic HFrEF secondary to NICM/RV  dysfunction/pulmonary hypertension: -Exacerbated by running out of medications  -His cardiomyopathy has been out of proportion to his underlying CAD -Recent echo in 03/2020 with an improving LVSF noted with compared to study in 11/2019 as outlined above  -He remains volume up with significant abdominal distension  -Continue IV Lasix 40 mg bid for at least another day -Coreg, losartan, Imdur/hydralazine -Not on spironolactone with underlying CKD and medical compliance/follow up issues -Escalate GDMT as/if able  -CHF education -Daily weights -Strict I/O  2. Nonobstructive CAD with elevated troponin: -No chest pain  -Not consistent with ACS -Recent LHC in 11/2019 showed nonobstructive CAD -Supply demand ischemia in the setting of volume overload and CKD -No indication for heparin gtt or inpatient ischemic evaluation  -Eliquis in place of ASA to minimize bleeding risk -Continue Coreg, losartan, Imdur, and Lipitor   3. Persistent Afib: -Ventricular rates well controlled on exam, not on telemetry  -Prior echoes have demonstrated severe left atrium dilatation estimated at 4.9 cm by echo in 03/2020 -Continue rate control with Coreg -CHADS2VASc 3 (CHF, HTN, vascular disease) -Eliquis 5 mg bid (he does not meet reduced dosing criteria) -TSH normal -Potassium 3.8  4. Hypertensive heart disease: -Likely in the setting of running out of medications -Improving with resumption of antihypertensive medications -Continue Coreg, losartan, Imdur/hydralazine, and IV Lasix -Low sodium diet  5. CKD stage II: -Monitor with diuresis  -Likely secondary to hypertension  -Follow up with PCP/nephrology as an outpatient   6. Tobacco use: -Cessation advised   7. Homelessness: -Uncertain if he can afford medications -Social is assisting    For questions or  updates, please contact [place] Please consult [email or web address] for contact info under Cardiology/STEMI.   Signed, Christell Faith, PA-C [place] Pager: [phone] 06/20/2020, 10:43 AM

## 2020-06-20 NOTE — TOC Initial Note (Signed)
Transition of Care [facility]) - Initial/Assessment Note    Patient Details  Name: Gregory Howell MRN: [id number] Date of Birth: 1961-06-08  [facility] CM/SW Contact:    Allayne Butcher, RN Phone Number: 06/20/2020, 3:34 PM  Clinical Narrative:                 Patient admitted to the hospital with CHF exacerbation.  RNCM was able to speak with patient at the bedside.  Patient reports that he is homeless and for the past few nights has been staying in a [employer] container.  Patient does go to the [place] for primary care services and has been working with [facility] in [place].  He reports that they will have some beds opening up and he is scheduled to have an interview in the morning.  RNCM did provide patient with local community resources information and a guide to free and low cost healthcare.   Patient does not have any warm clothing. RNCM was able to find a long sleeve T shirt, short sleeve T shirt, Hooded sweat shirt and sweatpants all 2X in the charity clothing closest.  [facility] will provide these to patient before discharge tomorrow.   Expected Discharge Plan: Home/Self Care Barriers to Discharge: Continued Medical Work up   Patient Goals and CMS Choice Patient states their goals for this hospitalization and ongoing recovery are:: Patient wants help finding a place to live      Expected Discharge Plan and Services Expected Discharge Plan: Home/Self Care   Discharge Planning Services: CM Consult, Other - See comment (Community resources)   Living arrangements for the past 2 months: Homeless                                      Prior Living Arrangements/Services Living arrangements for the past 2 months: Homeless Lives with:: Self Patient language and need for interpreter reviewed:: Yes        Need for Family Participation in Patient Care: Yes (Comment) (CHF) Care giver support system in place?: No (comment) Current home services: DME  (cane) Criminal Activity/Legal Involvement Pertinent to Current Situation/Hospitalization: No - Comment as needed  Activities of Daily Living Home Assistive Devices/Equipment: Eyeglasses ADL Screening (condition at time of admission) Patient's cognitive ability adequate to safely complete daily activities?: Yes Is the patient deaf or have difficulty hearing?: No Does the patient have difficulty seeing, even when wearing glasses/contacts?: No Does the patient have difficulty concentrating, remembering, or making decisions?: No Patient able to express need for assistance with ADLs?: Yes Does the patient have difficulty dressing or bathing?: No Independently performs ADLs?: Yes (appropriate for developmental age) Does the patient have difficulty walking or climbing stairs?: No Weakness of Legs: None Weakness of Arms/Hands: None  Permission Sought/Granted                  Emotional Assessment Appearance:: Appears stated age Attitude/Demeanor/Rapport: Engaged Affect (typically observed): Accepting Orientation: : Oriented to Self, Oriented to Place, Oriented to  Time, Oriented to Situation Alcohol / Substance Use: Not Applicable Psych Involvement: No (comment)  Admission diagnosis:  Acute on chronic systolic congestive heart failure (HCC) [I50.23] Acute on chronic combined systolic (congestive) and diastolic (congestive) heart failure (HCC) [I50.43] Patient Active Problem List   Diagnosis Date Noted  . Acute on chronic HFrEF (heart failure with reduced ejection fraction) (HCC) 06/19/2020  .  CKD (chronic kidney disease), stage III (HCC) 06/19/2020  . Acute on chronic combined systolic and diastolic CHF (congestive heart failure) (HCC) 04/05/2020  . Nonobstructive atherosclerosis of coronary artery 12/07/2019  . AKI (acute kidney injury) (HCC) 12/07/2019  . Acute HFrEF (heart failure with reduced ejection fraction) (HCC)   . Atrial fibrillation with RVR (HCC) 12/04/2019  .  Substance induced mood disorder (HCC) 02/22/2017  . Alcohol intoxication (HCC) 02/22/2017  . Chest pain with moderate risk for cardiac etiology 11/02/2016  . Hypertensive urgency 11/02/2016  . Tobacco abuse 11/02/2016  . Leukocytosis 11/02/2016   PCP:  [facility]:   [facility] [address] ([address]), [place] - [address] [place] ([place]) [place] [address] Phone: [phone] Fax: [phone]  [id number] - [place], [place] - [address] AT [facility] [address] [place] [address] Phone: [phone] Fax: [phone]     Social Determinants of Health (SDOH) Interventions    Readmission Risk Interventions No flowsheet data found.

## 2020-06-20 NOTE — Evaluation (Signed)
Occupational Therapy Evaluation Patient Details Name: Gregory Howell MRN: [id number] DOB: 08-Jun-1961 Today's Date: 06/20/2020    History of Present Illness Patient is a 59 y.o. male with medical history significant of atrial fibrillation, chronic combined systolic diastolic CHF, CKD, alcohol abuse, hypertension, tobacco abuse. Patient presents with shortness of breath, worsening lower extremity edema, orthopena, CHF exacerbation and A. fib with RVR.   Clinical Impression   Gregory Howell presents today as agreeable, eager to participate in OT session. He is IND in bed mobility, although requiring a little additional time to transition supine<sit<stand. Here in the hospital he is using a SPC for ambulation, 2/2 pain in L knee and lower back. He reports that he does not own a cane, however, so outside of the hospital he uses a tall stick that he found. Gregory Howell is Mod IND for ambulation, dressing, grooming, and toileting, with occasional slight stumbles or LOB. His O2 sats remained in the high 90s throughout activity, but pt did become out of breath and had to take frequent rest breaks. Gregory Howell has been homeless since March, sleeping on the street or in cars. We discussed energy conservation strategies to address SOB, housing options, how to manage medications and health care despite being unhoused, reducing cigarette and alcohol use. No further OT indicated at this time.     Follow Up Recommendations  No OT follow up    Equipment Recommendations  Other (comment) (SPC (pt is currently using a stick))    Recommendations for Other Services       Precautions / Restrictions Precautions Precautions: None Restrictions Weight Bearing Restrictions: No      Mobility Bed Mobility Overal bed mobility: Modified Independent             General bed mobility comments: Required additional time for supine<sit    Transfers Overall transfer level: Needs assistance Equipment used: Straight  cane Transfers: Sit to/from Stand Sit to Stand: Supervision         General transfer comment: Supervision for safety     Balance Overall balance assessment: Needs assistance Sitting-balance support: Feet supported Sitting balance-Leahy Scale: Normal Sitting balance - Comments: Good sitting balance with reaching outside base of support    Standing balance support: Single extremity supported Standing balance-Leahy Scale: Fair Standing balance comment: occassional slight LOB during functional ambulation                           ADL either performed or assessed with clinical judgement   ADL Overall ADL's : Independent                                             Vision Patient Visual Report: No change from baseline       Perception     Praxis      Pertinent Vitals/Pain Pain Assessment: No/denies pain     Hand Dominance     Extremity/Trunk Assessment Upper Extremity Assessment Upper Extremity Assessment: Overall WFL for tasks assessed   Lower Extremity Assessment Lower Extremity Assessment: Overall WFL for tasks assessed (reports frequent pain in L knee, esp when walking down stairs) RLE Deficits / Details: 4+/5 dorsiflexion, plantarflexion, knee extension  RLE Sensation: WNL LLE Deficits / Details: 4+/5 dorsiflexion, plantarflexion. 4/5 knee extension  LLE Sensation: WNL   Cervical / Trunk Assessment Cervical / Trunk  Assessment: Normal   Communication Communication Communication: No difficulties   Cognition Arousal/Alertness: Awake/alert Behavior During Therapy: WFL for tasks assessed/performed Overall Cognitive Status: Within Functional Limits for tasks assessed                                     General Comments       Exercises Other Exercises Other Exercises: Educ re: med mgmt, falls prevention, social services, DC recs, smoking cessation, alcohol rehab   Shoulder Instructions      Home Living  Family/patient expects to be discharged to:: Shelter/Homeless                                 Additional Comments: Pt is trying to find a place at a homeless shelter in [place].      Prior Functioning/Environment Level of Independence: Independent with assistive device(s)        Comments: Pt uses a walking stick for ambulation, 2/2 pain in lower back and L knee        OT Problem List: Decreased strength;Impaired balance (sitting and/or standing);Decreased activity tolerance;Cardiopulmonary status limiting activity      OT Treatment/Interventions:      OT Goals(Current goals can be found in the care plan section) Acute Rehab OT Goals Patient Stated Goal: to find someplace to live before it gets cold OT Goal Formulation: With patient Time For Goal Achievement: 07/04/20 Potential to Achieve Goals: Good  OT Frequency:     Barriers to D/C:            Co-evaluation              AM-PAC OT "6 Clicks" Daily Activity     Outcome Measure Help from another person eating meals?: None Help from another person taking care of personal grooming?: None Help from another person toileting, which includes using toliet, bedpan, or urinal?: None Help from another person bathing (including washing, rinsing, drying)?: None Help from another person to put on and taking off regular upper body clothing?: None Help from another person to put on and taking off regular lower body clothing?: None 6 Click Score: 24   End of Session Equipment Utilized During Treatment: Other (comment) (spc)  Activity Tolerance: Patient tolerated treatment well Patient left: in bed;with call bell/phone within reach  OT Visit Diagnosis: Unsteadiness on feet (R26.81);Muscle weakness (generalized) (M62.81);Other abnormalities of gait and mobility (R26.89)                Time: 6269-4854 OT Time Calculation (min): 22 min Charges:  OT General Charges $OT Visit: 1 Visit OT Evaluation $OT Eval Low  Complexity: 1 Low OT Treatments $Self Care/Home Management : 8-22 mins  Latina Craver, PhD, MS, OTR/L ascom [phone] 06/20/20, 12:38 PM

## 2020-06-21 ENCOUNTER — Other Ambulatory Visit: Payer: Self-pay | Admitting: Cardiovascular Disease

## 2020-06-21 DIAGNOSIS — Z720 Tobacco use: Secondary | ICD-10-CM | POA: Diagnosis not present

## 2020-06-21 DIAGNOSIS — I160 Hypertensive urgency: Secondary | ICD-10-CM | POA: Diagnosis not present

## 2020-06-21 DIAGNOSIS — I4891 Unspecified atrial fibrillation: Secondary | ICD-10-CM | POA: Diagnosis not present

## 2020-06-21 DIAGNOSIS — I5023 Acute on chronic systolic (congestive) heart failure: Secondary | ICD-10-CM | POA: Diagnosis not present

## 2020-06-21 LAB — CBC
HCT: 39.9 % (ref 39.0–52.0)
Hemoglobin: 14.5 g/dL (ref 13.0–17.0)
MCH: 29.9 pg (ref 26.0–34.0)
MCHC: 36.3 g/dL — ABNORMAL HIGH (ref 30.0–36.0)
MCV: 82.3 fL (ref 80.0–100.0)
Platelets: 267 10*3/uL (ref 150–400)
RBC: 4.85 MIL/uL (ref 4.22–5.81)
RDW: 16.9 % — ABNORMAL HIGH (ref 11.5–15.5)
WBC: 11.6 10*3/uL — ABNORMAL HIGH (ref 4.0–10.5)
nRBC: 0 % (ref 0.0–0.2)

## 2020-06-21 LAB — BASIC METABOLIC PANEL
Anion gap: 10 (ref 5–15)
BUN: 17 mg/dL (ref 8–23)
CO2: 26 mmol/L (ref 22–32)
Calcium: 8.2 mg/dL — ABNORMAL LOW (ref 8.9–10.3)
Chloride: 101 mmol/L (ref 98–111)
Creatinine, Ser: 1.37 mg/dL — ABNORMAL HIGH (ref 0.61–1.24)
GFR, Estimated: 59 mL/min — ABNORMAL LOW (ref >60)
Glucose, Bld: 93 mg/dL (ref 70–99)
Potassium: 3.4 mmol/L — ABNORMAL LOW (ref 3.5–5.1)
Sodium: 137 mmol/L (ref 135–145)

## 2020-06-21 LAB — BLOOD GAS, VENOUS
Acid-Base Excess: 2.8 mmol/L — ABNORMAL HIGH (ref 0.0–2.0)
Bicarbonate: 27.2 mmol/L (ref 20.0–28.0)
O2 Saturation: 89.4 %
Patient temperature: 37
pCO2, Ven: 40 mmHg — ABNORMAL LOW (ref 44.0–60.0)
pH, Ven: 7.44 — ABNORMAL HIGH (ref 7.250–7.430)
pO2, Ven: 55 mmHg — ABNORMAL HIGH (ref 32.0–45.0)

## 2020-06-21 LAB — MAGNESIUM: Magnesium: 2 mg/dL (ref 1.7–2.4)

## 2020-06-21 MED ORDER — POTASSIUM CHLORIDE CRYS ER 20 MEQ PO TBCR
40.0000 meq | EXTENDED_RELEASE_TABLET | Freq: Once | ORAL | Status: AC
  Administered 2020-06-21: 40 meq via ORAL
  Filled 2020-06-21: qty 2

## 2020-06-21 MED ORDER — CARVEDILOL 25 MG PO TABS: 25.0000 mg | ORAL_TABLET | Freq: Two times a day (BID) | ORAL | 0 refills | Status: DC

## 2020-06-21 MED ORDER — FUROSEMIDE 40 MG PO TABS: 40.0000 mg | ORAL_TABLET | Freq: Every day | ORAL | 0 refills | Status: DC

## 2020-06-21 NOTE — Progress Notes (Signed)
   Heart Failure Nurse Navigator Note  HfrEF 25-30%.  Right ventriclar systolic function is severely decreased/  Mild MR. Moderately elevated pulmonary arterial systolic pressure.   Co morbidities:  NON obstructive coronary artery disease Hypertension Persistent Atrial fibrillation CKD stage III  Tobacco and alcohol abuse  Medications:  Eliquis 5 mg BID ASA 81 mg daily Lipitor 80 mg daily Coreg 25 mg BID Lasix 40 mg IV BID Hydralazine 50 mg every 8 hours Imdur 30 mg daly Cozaar 50 mg daily  Labs:  Sodium 137, potassium 3.4, BUN 17, creatinine 1.37.  BP 133/97 pulse 94 BMI 32.75 Intake 480 ml Output 1050 ml Weight 100.6 kg (97.4)  Assessment:  General- he is awake and alert lying in bed, states he just returned from a walk around the nurses station, which left him sob.  HEENT-pupils equal, non icteric, no JVD.  Chest- breath sound clear to posterior auscultation.  Abdomen- rounded and non tender  Musculoskeletal-trace lower extremity edema  Psych- is pleasant and appropriate, makes eye contact.  Neuro-moves all extremities, speech clear.   He states he had just returned from walking in the hall, was SOB with that activity.  He knows he still has some fluid on board that needs to be removed.  Discussed heart failure, he is wanting to take better care of himself.  Viewed heart failure videos "Alcohol and smoking"  "water Pill".  Discussed smoking -he states he is only smoking 3 cigarettes daily and usually it is stress that makes him reach for one.    Also contacted social worker to get him a scale.  He is still working on getting a place to live by the [place].  He states he may be able to live with relatives for a short time.   Tresa Endo RN,CHFN

## 2020-06-21 NOTE — Progress Notes (Signed)
PROGRESS NOTE    Gregory Howell  OZH:[id number] DOB: Mar 24, 1961 DOA: 06/19/2020 [facility]    Assessment & Plan:   Principal Problem:   Acute on chronic HFrEF (heart failure with reduced ejection fraction) (HCC) Active Problems:   Hypertensive urgency   Tobacco abuse   Leukocytosis   Atrial fibrillation with RVR (HCC)   CKD (chronic kidney disease), stage III (HCC)    Gregory Howell is a 59 y.o. male with medical history significant of atrial fibrillation, chronic combined systolic diastolic CHF, CKD, alcohol abuse, hypertension, tobacco abuse, who presented with shortness of breath for past 2 days.   # Acute on chronic systolic and diastolic CHF exacerbation # NICM, RV dysfunction, pulm HTN # Medication non-compliance --Presented with dyspnea, with abdominal distension, BNP 1063.  No hypoxia.   --Last TTE on 04/05/20 showed LVEF 40-45%. --Pt was confused about whether or not he was supposed to take water pills at home, and inconsistent with his history. --started on IV lasix on presentation. PLAN:  --cont IV lasix 40 mg BID, per cards. --Strict I/O --cont coreg and losartan  # Nonobstructive CAD # Mild trop elevation due to demand ischemia --no chest pain.  Recent LHC in 11/2019 showed nonobstructive CAD. PLAN: --cont coreg, losartan, Imdur --cont lipitor  # Persistent Afib # Acquired thrombophilia --rate controlled PLAN: --cont coreg --cont Eliquis  # HTN --cont coreg, losartan, Imdur, hydralazine --IV lasix 40 mg BID  # CKD stage 2 --Monitor Cr with diuresis  # Tobacco use --nicotine patch  # Homelessness --different stories to different providers, said he was homeless, said he was moving to [place], said he was living in his wife's care, said he has an address that's his mother-in-law's house. PLAN:  --[facility] and provide a list of resources --2 weeks of essential meds will be provided by med management at  discharge, rest send to [facility] pharmacy   DVT prophylaxis: GE:XBMWUXL Code Status: Full code  Family Communication:  Status is: inpatient Dispo:   The patient is from: unclear Anticipated d/c is to: unclear Anticipated d/c date is: tomorrow Patient currently is not medically stable to d/c due to: on IV lasix, per cardiology rec   Subjective and Interval History:  Pt reported breathing better, voiding a lot, and walked around the hall.  Reported abdomen smaller.   Objective: Vitals:   06/21/20 0016 06/21/20 0543 06/21/20 0823 06/21/20 1203  BP: 135/79 (!) 133/97 (!) 152/97 125/82  Pulse: 88 94 96 95  Resp: 18 18 16 16   Temp: 97.8 F (36.6 C) 97.6 F (36.4 C) 98.8 F (37.1 C) 98.2 F (36.8 C)  TempSrc:  Oral    SpO2: 91% 94% 94% 96%  Weight:  100.6 kg    Height:        Intake/Output Summary (Last 24 hours) at 06/21/2020 1401 Last data filed at 06/21/2020 1020 Gross per 24 hour  Intake 480 ml  Output 650 ml  Net -170 ml   Filed Weights   06/19/20 1638 06/20/20 0500 06/21/20 0543  Weight: 97.5 kg 97.4 kg 100.6 kg    Examination:   Constitutional: NAD, AAOx3 HEENT: conjunctivae and lids normal, EOMI CV: No cyanosis.   RESP: normal respiratory effort, on RA Extremities: No effusions, edema in BLE SKIN: warm, dry and intact Neuro: II - XII grossly intact.   Psych: Normal mood and affect.     Data Reviewed: I have personally reviewed following labs and  imaging studies  CBC: Recent Labs  Lab 06/19/20 1640 06/20/20 0437 06/21/20 0531  WBC 13.4* 12.3* 11.6*  NEUTROABS  --  9.5*  --   HGB 15.2 13.8 14.5  HCT 42.5 37.9* 39.9  MCV 82.2 82.6 82.3  PLT 297 260 267   Basic Metabolic Panel: Recent Labs  Lab 06/19/20 1640 06/20/20 0437 06/21/20 0531  NA 141 140 137  K 4.2 3.8 3.4*  CL 108 108 101  CO2 24 25 26   GLUCOSE 106* 108* 93  BUN 18 18 17   CREATININE 1.34* 1.37* 1.37*  CALCIUM 8.7* 8.5* 8.2*  MG  --  2.0 2.0   GFR: Estimated Creatinine  Clearance: 66.2 mL/min (A) (by C-G formula based on SCr of 1.37 mg/dL (H)). Liver Function Tests: Recent Labs  Lab 06/19/20 2118 06/20/20 0437  AST 30 23  ALT 28 23  ALKPHOS 138* 130*  BILITOT 2.9* 2.3*  PROT 7.6 6.6  ALBUMIN 3.9 3.4*   No results for input(s): LIPASE, AMYLASE in the last 168 hours. No results for input(s): AMMONIA in the last 168 hours. Coagulation Profile: No results for input(s): INR, PROTIME in the last 168 hours. Cardiac Enzymes: No results for input(s): CKTOTAL, CKMB, CKMBINDEX, TROPONINI in the last 168 hours. BNP (last 3 results) No results for input(s): PROBNP in the last 8760 hours. HbA1C: No results for input(s): HGBA1C in the last 72 hours. CBG: No results for input(s): GLUCAP in the last 168 hours. Lipid Profile: No results for input(s): CHOL, HDL, LDLCALC, TRIG, CHOLHDL, LDLDIRECT in the last 72 hours. Thyroid Function Tests: Recent Labs    06/20/20 0437  TSH 1.221   Anemia Panel: No results for input(s): VITAMINB12, FOLATE, FERRITIN, TIBC, IRON, RETICCTPCT in the last 72 hours. Sepsis Labs: No results for input(s): PROCALCITON, LATICACIDVEN in the last 168 hours.  Recent Results (from the past 240 hour(s))  Respiratory Panel by RT PCR (Flu A&B, Covid) - Nasopharyngeal Swab     Status: None   Collection Time: 06/19/20  9:18 PM   Specimen: Nasopharyngeal Swab  Result Value Ref Range Status   SARS Coronavirus 2 by RT PCR NEGATIVE NEGATIVE Final    Comment: (NOTE) SARS-CoV-2 target nucleic acids are NOT DETECTED.  The SARS-CoV-2 RNA is generally detectable in upper respiratoy specimens during the acute phase of infection. The lowest concentration of SARS-CoV-2 viral copies this assay can detect is 131 copies/mL. A negative result does not preclude SARS-Cov-2 infection and should not be used as the sole basis for treatment or other patient management decisions. A negative result may occur with  improper specimen collection/handling,  submission of specimen other than nasopharyngeal swab, presence of viral mutation(s) within the areas targeted by this assay, and inadequate number of viral copies (<131 copies/mL). A negative result must be combined with clinical observations, patient history, and epidemiological information. The expected result is Negative.  Fact Sheet for Patients:  13/11/21  Fact Sheet for Healthcare Providers:  13/10/21  This test is no t yet approved or cleared by the https://www.moore.com/ FDA and  has been authorized for detection and/or diagnosis of SARS-CoV-2 by FDA under an Emergency Use Authorization (EUA). This EUA will remain  in effect (meaning this test can be used) for the duration of the COVID-19 declaration under Section 564(b)(1) of the Act, 21 U.S.C. section 360bbb-3(b)(1), unless the authorization is terminated or revoked sooner.     Influenza A by PCR NEGATIVE NEGATIVE Final   Influenza B by PCR NEGATIVE NEGATIVE Final  Comment: (NOTE) The Xpert Xpress SARS-CoV-2/FLU/RSV assay is intended as an aid in  the diagnosis of influenza from Nasopharyngeal swab specimens and  should not be used as a sole basis for treatment. Nasal washings and  aspirates are unacceptable for Xpert Xpress SARS-CoV-2/FLU/RSV  testing.  Fact Sheet for Patients: https://www.moore.com/  Fact Sheet for Healthcare Providers: https://www.young.biz/  This test is not yet approved or cleared by the [place] FDA and  has been authorized for detection and/or diagnosis of SARS-CoV-2 by  FDA under an Emergency Use Authorization (EUA). This EUA will remain  in effect (meaning this test can be used) for the duration of the  Covid-19 declaration under Section 564(b)(1) of the Act, 21  U.S.C. section 360bbb-3(b)(1), unless the authorization is  terminated or revoked. Performed at [facility], [address] [place], [place] [address]       Radiology Studies: DG Chest 2 View  Result Date: 06/19/2020 CLINICAL DATA:  Worsening shortness of breath for 2 days, atrial fibrillation EXAM: CHEST - 2 VIEW COMPARISON:  04/04/2020 FINDINGS: Frontal and lateral views of the chest demonstrates stable enlargement of the cardiac silhouette. Increased central vascular congestion and interstitial prominence compatible with edema. Trace bilateral pleural effusions. No pneumothorax. No acute bony abnormalities. IMPRESSION: 1. Developing interstitial edema with small bilateral pleural effusions. Electronically Signed   By: Sharlet Salina M.D.   On: 06/19/2020 17:44     Scheduled Meds:  apixaban  5 mg Oral BID   atorvastatin  80 mg Oral Daily   carvedilol  25 mg Oral BID WC   furosemide  40 mg Intravenous BID   hydrALAZINE  50 mg Oral Q8H   isosorbide mononitrate  30 mg Oral Daily   losartan  50 mg Oral Daily   nicotine  14 mg Transdermal Daily   sodium chloride flush  3 mL Intravenous Q12H   Continuous Infusions:  sodium chloride       LOS: 2 days     Darlin Priestly, MD [facility] If 7PM-7AM, please contact night-coverage 06/21/2020, 2:01 PM

## 2020-06-21 NOTE — Progress Notes (Signed)
Progress Note  Patient Name: Gregory Howell Date of Encounter: 06/21/2020   Primary [facility] Provider: [facility] [place] [facility] Primary Cardiologist: Kirke Corin  Subjective   Reports feeling somewhat better, still with abdominal distention, ambulated around the nursing unit, came back to bed, on laying supine with shortness of breath   Inpatient Medications    Scheduled Meds: . apixaban  5 mg Oral BID  . atorvastatin  80 mg Oral Daily  . carvedilol  25 mg Oral BID WC  . furosemide  40 mg Intravenous BID  . hydrALAZINE  50 mg Oral Q8H  . isosorbide mononitrate  30 mg Oral Daily  . losartan  50 mg Oral Daily  . nicotine  14 mg Transdermal Daily  . sodium chloride flush  3 mL Intravenous Q12H   Continuous Infusions: . sodium chloride     PRN Meds: sodium chloride, acetaminophen, nitroGLYCERIN, ondansetron (ZOFRAN) IV, sodium chloride flush   Vital Signs    Vitals:   06/21/20 0543 06/21/20 0823 06/21/20 1203 06/21/20 1448  BP: (!) 133/97 (!) 152/97 125/82 133/73  Pulse: 94 96 95 84  Resp: 18 16 16    Temp: 97.6 F (36.4 C) 98.8 F (37.1 C) 98.2 F (36.8 C)   TempSrc: Oral     SpO2: 94% 94% 96%   Weight: 100.6 kg     Height:        Intake/Output Summary (Last 24 hours) at 06/21/2020 1527 Last data filed at 06/21/2020 1020 Gross per 24 hour  Intake 240 ml  Output 650 ml  Net -410 ml   Last 3 Weights 06/21/2020 06/20/2020 06/19/2020  Weight (lbs) 221 lb 12.8 oz 214 lb 11.7 oz 215 lb  Weight (kg) 100.608 kg 97.4 kg 97.523 kg      Telemetry    Atrial fibrillation- Personally Reviewed  ECG     - Personally Reviewed  Physical Exam   GEN: No acute distress.   Neck: No JVD Cardiac:  Irregularly irregular no murmurs, rubs, or gallops.  Respiratory: Clear to auscultation bilaterally. GI: Soft, nontender, mildly-distended  MS: No edema; No deformity. Neuro:  Nonfocal  Psych: Normal affect   Labs    High Sensitivity Troponin:   Recent Labs  Lab  06/19/20 2118 06/19/20 2305 06/20/20 0437 06/20/20 0645  TROPONINIHS 44* 34* 28* 29*      Chemistry Recent Labs  Lab 06/19/20 1640 06/19/20 2118 06/20/20 0437 06/21/20 0531  NA 141  --  140 137  K 4.2  --  3.8 3.4*  CL 108  --  108 101  CO2 24  --  25 26  GLUCOSE 106*  --  108* 93  BUN 18  --  18 17  CREATININE 1.34*  --  1.37* 1.37*  CALCIUM 8.7*  --  8.5* 8.2*  PROT  --  7.6 6.6  --   ALBUMIN  --  3.9 3.4*  --   AST  --  30 23  --   ALT  --  28 23  --   ALKPHOS  --  138* 130*  --   BILITOT  --  2.9* 2.3*  --   GFRNONAA >60  --  59* 59*  ANIONGAP 9  --  7 10     Hematology Recent Labs  Lab 06/19/20 1640 06/20/20 0437 06/21/20 0531  WBC 13.4* 12.3* 11.6*  RBC 5.17 4.59 4.85  HGB 15.2 13.8 14.5  HCT 42.5 37.9* 39.9  MCV 82.2 82.6 82.3  MCH 29.4  30.1 29.9  MCHC 35.8 36.4* 36.3*  RDW 17.2* 16.5* 16.9*  PLT 297 260 267    BNP Recent Labs  Lab 06/19/20 1640  BNP 1,063.1*     DDimer No results for input(s): DDIMER in the last 168 hours.   Radiology    DG Chest 2 View  Result Date: 06/19/2020 CLINICAL DATA:  Worsening shortness of breath for 2 days, atrial fibrillation EXAM: CHEST - 2 VIEW COMPARISON:  04/04/2020 FINDINGS: Frontal and lateral views of the chest demonstrates stable enlargement of the cardiac silhouette. Increased central vascular congestion and interstitial prominence compatible with edema. Trace bilateral pleural effusions. No pneumothorax. No acute bony abnormalities. IMPRESSION: 1. Developing interstitial edema with small bilateral pleural effusions. Electronically Signed   By: Sharlet Salina M.D.   On: 06/19/2020 17:44    Cardiac Studies   Echocardiogram April 05, 2020  1. Left ventricular ejection fraction, by estimation, is 40 to 45%. The  left ventricle has mildly decreased function. Hypokinesis of basal  regions.There is moderate left ventricular hypertrophy. Left ventricular  diastolic parameters are indeterminate.  2.  Right ventricular systolic function is mildly reduced. The right  ventricular size is mildly enlarged. There is mildly elevated pulmonary  artery systolic pressure.  3. Left atrial size was severely dilated.  4. Right atrial size was moderately dilated.   Patient Profile     Gregory Howell is a 59 year old gentleman with history of nonischemic cardiomyopathy, pulmonary hypertension, persistent atrial fibrillation, hypertensive heart disease, medication noncompliance, presenting with acute on chronic systolic CHF  Assessment & Plan    Acute on chronic diastolic and systolic CHF Out of his medications past several weeks, some financial struggles, homeless --- Abdomen distended, presentation similar to prior CHF presentation in early 2021 -He appears to be responding to Lasix IV twice daily, would continue with reevaluation tomorrow -Once felt to be euvolemic will transition back to oral Lasix twice daily dosing  Atrial fibrillation Continue carvedilol, Eliquis Rate improving with restart of his beta-blockers  Hypertensive heart disease with congestive heart failure Has been out of medications for several weeks, Blood pressure improving on  carvedilol 25 twice daily Losartan 50 daily  Imdur 30 daily  hydralazine 50 3 times daily --- Would hope to be able to send his medications into the [facility] mail order  Smoker Cessation recommended  Homeless May need to be set up in a shelter Reports that he shares an address  for his medications through mail with a family member   Total encounter time more than 25 minutes  Greater than 50% was spent in counseling and coordination of care with the patient  For questions or updates, please contact [facility] Please consult [email or web address] for contact info under        Signed, Julien Nordmann, MD  06/21/2020, 3:27 PM

## 2020-06-21 NOTE — TOC Progression Note (Addendum)
Transition of Care [facility]) - Progression Note    Patient Details  Name: Gregory Howell MRN: [id number] Date of Birth: 14-Jan-1961  Transition of Care [facility]) CM/SW Contact  Liliana Cline, LCSW Phone Number: 06/21/2020, 10:50 AM  Clinical Narrative:   Provided clothing to patient. Patient confirmed he has a housing interview tomorrow. Patient also confirmed he has list of resources provided by [employer]. Patient denied additional needs prior to discharge at this time.  1:45- CSW discussed Outpatient PT recommendation with patient. Patient agreeable to Outpatient PT. Patient says he will have family transport him or ride the bus to Outpatient PT. List provided. Patient chooses [facility] Out[facility]. CSW will fax referral form. CSW also provided scale as requested by Heart Failure RN.   3:10- CSW updated by MD that patient needs oral lasix and gets medications from [place] so would not be able to get these right away. Per MD patient will not discharge today. Checked and Lasix is on $4 list at [facility] [employer]. Patient confirms he can get this from [facility] at discharge. Patient said he could have his wife and daughter pick the medications up.  3:40- Spoke with Dr. Fran Lowes and concern about patient following through with picking up medications. CSW spoke to Pharmacist [facility] at Medication Management who can provide patient's 2 prescriptions today (2 weeks worth until [place] can provide). Prescriptions sent to Medication Management by Dr. Fran Lowes and [facility] Assistant [place] will deliver to the Unit.  4:15- Faxed Outpatient PT Referral.     Expected Discharge Plan: Home/Self Care Barriers to Discharge: Continued Medical Work up  Expected Discharge Plan and Services Expected Discharge Plan: Home/Self Care   Discharge Planning Services: CM Consult, Other - See comment (Community resources)   Living arrangements for the past 2 months: Homeless                                       Social Determinants of  Health (SDOH) Interventions    Readmission Risk Interventions No flowsheet data found.

## 2020-06-22 DIAGNOSIS — N1831 Chronic kidney disease, stage 3a: Secondary | ICD-10-CM | POA: Diagnosis not present

## 2020-06-22 DIAGNOSIS — I4891 Unspecified atrial fibrillation: Secondary | ICD-10-CM | POA: Diagnosis not present

## 2020-06-22 DIAGNOSIS — I5023 Acute on chronic systolic (congestive) heart failure: Secondary | ICD-10-CM | POA: Diagnosis not present

## 2020-06-22 DIAGNOSIS — I160 Hypertensive urgency: Secondary | ICD-10-CM | POA: Diagnosis not present

## 2020-06-22 LAB — CBC
HCT: 43.2 % (ref 39.0–52.0)
Hemoglobin: 15.2 g/dL (ref 13.0–17.0)
MCH: 29.5 pg (ref 26.0–34.0)
MCHC: 35.2 g/dL (ref 30.0–36.0)
MCV: 83.7 fL (ref 80.0–100.0)
Platelets: 281 10*3/uL (ref 150–400)
RBC: 5.16 MIL/uL (ref 4.22–5.81)
RDW: 16.8 % — ABNORMAL HIGH (ref 11.5–15.5)
WBC: 12.1 10*3/uL — ABNORMAL HIGH (ref 4.0–10.5)
nRBC: 0 % (ref 0.0–0.2)

## 2020-06-22 LAB — BASIC METABOLIC PANEL
Anion gap: 9 (ref 5–15)
BUN: 23 mg/dL (ref 8–23)
CO2: 27 mmol/L (ref 22–32)
Calcium: 8.7 mg/dL — ABNORMAL LOW (ref 8.9–10.3)
Chloride: 102 mmol/L (ref 98–111)
Creatinine, Ser: 1.81 mg/dL — ABNORMAL HIGH (ref 0.61–1.24)
GFR, Estimated: 42 mL/min — ABNORMAL LOW (ref >60)
Glucose, Bld: 121 mg/dL — ABNORMAL HIGH (ref 70–99)
Potassium: 3.8 mmol/L (ref 3.5–5.1)
Sodium: 138 mmol/L (ref 135–145)

## 2020-06-22 LAB — MAGNESIUM: Magnesium: 2.1 mg/dL (ref 1.7–2.4)

## 2020-06-22 NOTE — Plan of Care (Signed)
  Problem: Education: Goal: Ability to demonstrate management of disease process will improve Outcome: Progressing   Problem: Activity: Goal: Capacity to carry out activities will improve Outcome: Progressing   Problem: Elimination: Goal: Will not experience complications related to bowel motility Outcome: Progressing   Problem: Elimination: Goal: Will not experience complications related to bowel motility Outcome: Progressing   Problem: Elimination: Goal: Will not experience complications related to urinary retention Outcome: Progressing

## 2020-06-22 NOTE — Progress Notes (Signed)
PROGRESS NOTE    Gregory Howell  BWI:[id number] DOB: Dec 17, 1960 DOA: 06/19/2020 PCP: [facility]    Assessment & Plan:   Principal Problem:   Acute on chronic HFrEF (heart failure with reduced ejection fraction) (HCC) Active Problems:   Hypertensive urgency   Tobacco abuse   Leukocytosis   Atrial fibrillation with RVR (HCC)   CKD (chronic kidney disease), stage III (HCC)    Gregory Howell is a 59 y.o. male with medical history significant of atrial fibrillation, chronic combined systolic diastolic CHF, CKD, alcohol abuse, hypertension, tobacco abuse, who presented with shortness of breath for past 2 days.   # Acute on chronic systolic and diastolic CHF exacerbation # NICM, RV dysfunction, pulm HTN # Medication non-compliance --Presented with dyspnea, with abdominal distension, BNP 1063.  No hypoxia.   --Last TTE on 04/05/20 showed LVEF 40-45%. --Pt was confused about whether or not he was supposed to take water pills at home, and inconsistent with his history. --started on IV lasix 40 mg BID on presentation. PLAN:  --Hold diuresis today due to AKI --Strict I/O --cont coreg and losartan  AKI # CKD stage 2 --Cr went up to 1.81 from 1.37 yesterday.  Likely due to over-diuresis. PLAN: --Hold diuresis today --encourage oral hydration  # Nonobstructive CAD # Mild trop elevation due to demand ischemia --no chest pain.  Recent LHC in 11/2019 showed nonobstructive CAD. PLAN: --cont coreg, losartan, Imdur --cont lipitor  # Persistent Afib # Acquired thrombophilia --rate controlled PLAN: --cont coreg --cont Eliquis  # HTN --BP still somewhat elevated --cont coreg, losartan, Imdur and hydralazine --Hold diuresis today due to AKI  # Tobacco use --nicotine patch  # Homelessness --different stories to different providers, said he was homeless, said he was moving to [facility], said he was living in his wife's care, said he has an address that's his  mother-in-law's house. PLAN:  --TOC [facility] help [facility] with [facility] and provide a list of resources --2 weeks of essential meds will be provided by med management at discharge, rest send to [facility] pharmacy   DVT prophylaxis: UL:[id number] Code Status: Full code  Family Communication:  Status is: inpatient Dispo:   The patient is from: unclear Anticipated d/c is to: wife's house, per pt Anticipated d/c date is: tomorrow Patient currently is not medically stable to d/c due to: new AKI, need to see Cr improve tomorrow before discharge.   Subjective and Interval History:  Pt reported dyspnea and abdominal swelling both improved.  Still making a lot of urine.   Cr increased this morning by >0.4   Objective: Vitals:   06/21/20 1944 06/22/20 0006 06/22/20 0500 06/22/20 0700  BP: (!) 135/96 131/87 136/86 (!) 164/94  Pulse: 78 88 85 85  Resp: 17 17 18    Temp: 98.2 F (36.8 C) 98.7 F (37.1 C) 97.6 F (36.4 C) 97.7 F (36.5 C)  TempSrc: Oral Oral Oral Oral  SpO2: 100% 95% 95% 96%  Weight:   96.8 kg   Height:        Intake/Output Summary (Last 24 hours) at 06/22/2020 1305 Last data filed at 06/22/2020 1031 Gross per 24 hour  Intake 120 ml  Output 475 ml  Net -355 ml   Filed Weights   06/20/20 0500 06/21/20 0543 06/22/20 0500  Weight: 97.4 kg 100.6 kg 96.8 kg    Examination:   Constitutional: NAD, AAOx3 HEENT: conjunctivae and lids normal, EOMI CV: No cyanosis.   RESP: normal respiratory effort, on  RA Extremities: No effusions, edema in BLE SKIN: warm, dry and intact Neuro: II - XII grossly intact.   Psych: Normal mood and affect.      Data Reviewed: I have personally reviewed following labs and imaging studies  CBC: Recent Labs  Lab 06/19/20 1640 06/20/20 0437 06/21/20 0531 06/22/20 0424  WBC 13.4* 12.3* 11.6* 12.1*  NEUTROABS  --  9.5*  --   --   HGB 15.2 13.8 14.5 15.2  HCT 42.5 37.9* 39.9 43.2  MCV 82.2 82.6 82.3 83.7  PLT 297 260 267 281   Basic  Metabolic Panel: Recent Labs  Lab 06/19/20 1640 06/20/20 0437 06/21/20 0531 06/22/20 0424  NA 141 140 137 138  K 4.2 3.8 3.4* 3.8  CL 108 108 101 102  CO2 24 25 26 27   GLUCOSE 106* 108* 93 121*  BUN 18 18 17 23   CREATININE 1.34* 1.37* 1.37* 1.81*  CALCIUM 8.7* 8.5* 8.2* 8.7*  MG  --  2.0 2.0 2.1   GFR: Estimated Creatinine Clearance: 49.2 mL/min (A) (by C-G formula based on SCr of 1.81 mg/dL (H)). Liver Function Tests: Recent Labs  Lab 06/19/20 2118 06/20/20 0437  AST 30 23  ALT 28 23  ALKPHOS 138* 130*  BILITOT 2.9* 2.3*  PROT 7.6 6.6  ALBUMIN 3.9 3.4*   No results for input(s): LIPASE, AMYLASE in the last 168 hours. No results for input(s): AMMONIA in the last 168 hours. Coagulation Profile: No results for input(s): INR, PROTIME in the last 168 hours. Cardiac Enzymes: No results for input(s): CKTOTAL, CKMB, CKMBINDEX, TROPONINI in the last 168 hours. BNP (last 3 results) No results for input(s): PROBNP in the last 8760 hours. HbA1C: No results for input(s): HGBA1C in the last 72 hours. CBG: No results for input(s): GLUCAP in the last 168 hours. Lipid Profile: No results for input(s): CHOL, HDL, LDLCALC, TRIG, CHOLHDL, LDLDIRECT in the last 72 hours. Thyroid Function Tests: Recent Labs    06/20/20 0437  TSH 1.221   Anemia Panel: No results for input(s): VITAMINB12, FOLATE, FERRITIN, TIBC, IRON, RETICCTPCT in the last 72 hours. Sepsis Labs: No results for input(s): PROCALCITON, LATICACIDVEN in the last 168 hours.  Recent Results (from the past 240 hour(s))  Respiratory Panel by RT PCR (Flu A&B, Covid) - Nasopharyngeal Swab     Status: None   Collection Time: 06/19/20  9:18 PM   Specimen: Nasopharyngeal Swab  Result Value Ref Range Status   SARS Coronavirus 2 by RT PCR NEGATIVE NEGATIVE Final    Comment: (NOTE) SARS-CoV-2 target nucleic acids are NOT DETECTED.  The SARS-CoV-2 RNA is generally detectable in upper respiratoy specimens during the acute  phase of infection. The lowest concentration of SARS-CoV-2 viral copies this assay can detect is 131 copies/mL. A negative result does not preclude SARS-Cov-2 infection and should not be used as the sole basis for treatment or other patient management decisions. A negative result may occur with  improper specimen collection/handling, submission of specimen other than nasopharyngeal swab, presence of viral mutation(s) within the areas targeted by this assay, and inadequate number of viral copies (<131 copies/mL). A negative result must be combined with clinical observations, patient history, and epidemiological information. The expected result is Negative.  Fact Sheet for Patients:  13/11/21  Fact Sheet for Healthcare Providers:  13/10/21  This test is no t yet approved or cleared by the https://www.moore.com/ FDA and  has been authorized for detection and/or diagnosis of SARS-CoV-2 by FDA under an Emergency Use  Authorization (EUA). This EUA will remain  in effect (meaning this test can be used) for the duration of the COVID-19 declaration under Section 564(b)(1) of the Act, 21 U.S.C. section 360bbb-3(b)(1), unless the authorization is terminated or revoked sooner.     Influenza A by PCR NEGATIVE NEGATIVE Final   Influenza B by PCR NEGATIVE NEGATIVE Final    Comment: (NOTE) The Xpert Xpress SARS-CoV-2/FLU/RSV assay is intended as an aid in  the diagnosis of influenza from Nasopharyngeal swab specimens and  should not be used as a sole basis for treatment. Nasal washings and  aspirates are unacceptable for Xpert Xpress SARS-CoV-2/FLU/RSV  testing.  Fact Sheet for Patients: https://www.moore.com/  Fact Sheet for Healthcare Providers: https://www.young.biz/  This test is not yet approved or cleared by the [place] FDA and  has been authorized for detection and/or diagnosis of  SARS-CoV-2 by  FDA under an Emergency Use Authorization (EUA). This EUA will remain  in effect (meaning this test can be used) for the duration of the  Covid-19 declaration under Section 564(b)(1) of the Act, 21  U.S.C. section 360bbb-3(b)(1), unless the authorization is  terminated or revoked. Performed at [facility], [address], [place], [place] [address]       Radiology Studies: No results found.   Scheduled Meds: . apixaban  5 mg Oral BID  . atorvastatin  80 mg Oral Daily  . carvedilol  25 mg Oral BID WC  . hydrALAZINE  50 mg Oral Q8H  . isosorbide mononitrate  30 mg Oral Daily  . losartan  50 mg Oral Daily  . nicotine  14 mg Transdermal Daily   Continuous Infusions:    LOS: 3 days     Darlin Priestly, MD [facility] If 7PM-7AM, please contact night-coverage 06/22/2020, 1:05 PM

## 2020-06-22 NOTE — Progress Notes (Signed)
Progress Note  Patient Name: Gregory Howell Date of Encounter: 06/22/2020   Primary [facility] Provider: [facility] Primary Cardiologist: Kirke Corin  Subjective   Reports shortness has improved, feels he is at his baseline Abdomen less distended Has been walking around the unit, no symptoms  Inpatient Medications    Scheduled Meds: . apixaban  5 mg Oral BID  . atorvastatin  80 mg Oral Daily  . carvedilol  25 mg Oral BID WC  . hydrALAZINE  50 mg Oral Q8H  . isosorbide mononitrate  30 mg Oral Daily  . losartan  50 mg Oral Daily  . nicotine  14 mg Transdermal Daily   Continuous Infusions:  PRN Meds: acetaminophen, nitroGLYCERIN, ondansetron (ZOFRAN) IV   Vital Signs    Vitals:   06/22/20 0006 06/22/20 0500 06/22/20 0700 06/22/20 1155  BP: 131/87 136/86 (!) 164/94 (!) 145/96  Pulse: 88 85 85 63  Resp: 17 18  16   Temp: 98.7 F (37.1 C) 97.6 F (36.4 C) 97.7 F (36.5 C) 98 F (36.7 C)  TempSrc: Oral Oral Oral Oral  SpO2: 95% 95% 96% 99%  Weight:  96.8 kg    Height:        Intake/Output Summary (Last 24 hours) at 06/22/2020 1425 Last data filed at 06/22/2020 1031 Gross per 24 hour  Intake 120 ml  Output 475 ml  Net -355 ml   Last 3 Weights 06/22/2020 06/21/2020 06/20/2020  Weight (lbs) 213 lb 5 oz 221 lb 12.8 oz 214 lb 11.7 oz  Weight (kg) 96.758 kg 100.608 kg 97.4 kg      Telemetry    Atrial fibrillation- Personally Reviewed  ECG     - Personally Reviewed  Physical Exam   Constitutional:  oriented to person, place, and time. No distress.  HENT:  Head: Grossly normal Eyes:  no discharge. No scleral icterus.  Neck: No JVD, no carotid bruits  Cardiovascular: Irregularly irregular, no murmurs appreciated Pulmonary/Chest: Clear to auscultation bilaterally, no wheezes or rails Abdominal: Soft.  no distension.  no tenderness.  Musculoskeletal: Normal range of motion Neurological:  normal muscle tone. Coordination normal. No atrophy Skin: Skin  warm and dry Psychiatric: normal affect, pleasant   Labs    High Sensitivity Troponin:   Recent Labs  Lab 06/19/20 2118 06/19/20 2305 06/20/20 0437 06/20/20 0645  TROPONINIHS 44* 34* 28* 29*      Chemistry Recent Labs  Lab 06/19/20 1640 06/19/20 2118 06/20/20 0437 06/21/20 0531 06/22/20 0424  NA   < >  --  140 137 138  K   < >  --  3.8 3.4* 3.8  CL   < >  --  108 101 102  CO2   < >  --  25 26 27   GLUCOSE   < >  --  108* 93 121*  BUN   < >  --  18 17 23   CREATININE   < >  --  1.37* 1.37* 1.81*  CALCIUM   < >  --  8.5* 8.2* 8.7*  PROT  --  7.6 6.6  --   --   ALBUMIN  --  3.9 3.4*  --   --   AST  --  30 23  --   --   ALT  --  28 23  --   --   ALKPHOS  --  138* 130*  --   --   BILITOT  --  2.9* 2.3*  --   --  GFRNONAA   < >  --  59* 59* 42*  ANIONGAP   < >  --  7 10 9    < > = values in this interval not displayed.     Hematology Recent Labs  Lab 06/20/20 0437 06/21/20 0531 06/22/20 0424  WBC 12.3* 11.6* 12.1*  RBC 4.59 4.85 5.16  HGB 13.8 14.5 15.2  HCT 37.9* 39.9 43.2  MCV 82.6 82.3 83.7  MCH 30.1 29.9 29.5  MCHC 36.4* 36.3* 35.2  RDW 16.5* 16.9* 16.8*  PLT 260 267 281    BNP Recent Labs  Lab 06/19/20 1640  BNP 1,063.1*     DDimer No results for input(s): DDIMER in the last 168 hours.   Radiology    No results found.  Cardiac Studies   Echocardiogram April 05, 2020  1. Left ventricular ejection fraction, by estimation, is 40 to 45%. The  left ventricle has mildly decreased function. Hypokinesis of basal  regions.There is moderate left ventricular hypertrophy. Left ventricular  diastolic parameters are indeterminate.  2. Right ventricular systolic function is mildly reduced. The right  ventricular size is mildly enlarged. There is mildly elevated pulmonary  artery systolic pressure.  3. Left atrial size was severely dilated.  4. Right atrial size was moderately dilated.   Patient Profile     Gregory Howell is a 59 year old  gentleman with history of nonischemic cardiomyopathy, pulmonary hypertension, persistent atrial fibrillation, hypertensive heart disease, medication noncompliance, presenting with acute on chronic systolic CHF  Assessment & Plan    Acute on chronic diastolic and systolic CHF Out of his medications past several weeks, some financial struggles, homeless --- Abdomen distended, presentation similar to prior CHF presentation in early 2021 -Lasix held given climbing creatinine consistent with hypovolemia, prerenal state -He feels he is at his baseline  Atrial fibrillation Continue carvedilol, Eliquis Rate stable 60-80  Hypertensive heart disease with congestive heart failure Has been out of medications for several weeks, Improving numbers on carvedilol 25 twice daily Losartan 50 daily  Imdur 30 daily  hydralazine 50 3 times daily If blood pressure continues to run high could increase Imdur up to 60 daily  Smoker Cessation recommended  Homeless Has been working with social work    Total encounter time more than 25 minutes  Greater than 50% was spent in counseling and coordination of care with the patient  For questions or updates, please contact [facility] Please consult [email or web address] for contact info under        Signed, 2022, MD  06/22/2020, 2:25 PM

## 2020-06-22 NOTE — Progress Notes (Signed)
Dr Laurette Schimke gave verbal order to remove pt's peripheral IV site since the pt complained of it hurting him; ok not to have any IV access at this time

## 2020-06-23 DIAGNOSIS — I160 Hypertensive urgency: Secondary | ICD-10-CM | POA: Diagnosis not present

## 2020-06-23 DIAGNOSIS — I4891 Unspecified atrial fibrillation: Secondary | ICD-10-CM | POA: Diagnosis not present

## 2020-06-23 DIAGNOSIS — N1831 Chronic kidney disease, stage 3a: Secondary | ICD-10-CM | POA: Diagnosis not present

## 2020-06-23 DIAGNOSIS — I5023 Acute on chronic systolic (congestive) heart failure: Secondary | ICD-10-CM | POA: Diagnosis not present

## 2020-06-23 LAB — CBC
HCT: 43.6 % (ref 39.0–52.0)
Hemoglobin: 15.8 g/dL (ref 13.0–17.0)
MCH: 29.5 pg (ref 26.0–34.0)
MCHC: 36.2 g/dL — ABNORMAL HIGH (ref 30.0–36.0)
MCV: 81.5 fL (ref 80.0–100.0)
Platelets: 306 10*3/uL (ref 150–400)
RBC: 5.35 MIL/uL (ref 4.22–5.81)
RDW: 16.9 % — ABNORMAL HIGH (ref 11.5–15.5)
WBC: 10.5 10*3/uL (ref 4.0–10.5)
nRBC: 0 % (ref 0.0–0.2)

## 2020-06-23 LAB — BASIC METABOLIC PANEL
Anion gap: 10 (ref 5–15)
BUN: 22 mg/dL (ref 8–23)
CO2: 26 mmol/L (ref 22–32)
Calcium: 8.9 mg/dL (ref 8.9–10.3)
Chloride: 100 mmol/L (ref 98–111)
Creatinine, Ser: 1.41 mg/dL — ABNORMAL HIGH (ref 0.61–1.24)
GFR, Estimated: 57 mL/min — ABNORMAL LOW (ref >60)
Glucose, Bld: 104 mg/dL — ABNORMAL HIGH (ref 70–99)
Potassium: 4.1 mmol/L (ref 3.5–5.1)
Sodium: 136 mmol/L (ref 135–145)

## 2020-06-23 LAB — MAGNESIUM: Magnesium: 2.4 mg/dL (ref 1.7–2.4)

## 2020-06-23 MED ORDER — ATORVASTATIN CALCIUM 80 MG PO TABS: 80.0000 mg | ORAL_TABLET | Freq: Every day | ORAL | 0 refills | Status: DC

## 2020-06-23 MED ORDER — FUROSEMIDE 40 MG PO TABS: 40.0000 mg | ORAL_TABLET | Freq: Every day | ORAL | 0 refills | Status: DC

## 2020-06-23 MED ORDER — APIXABAN 5 MG PO TABS: 5.0000 mg | ORAL_TABLET | Freq: Two times a day (BID) | ORAL | 0 refills | Status: DC

## 2020-06-23 MED ORDER — NICOTINE 14 MG/24HR TD PT24: 14.0000 mg | MEDICATED_PATCH | Freq: Every day | TRANSDERMAL | 0 refills | Status: AC

## 2020-06-23 MED ORDER — CARVEDILOL 25 MG PO TABS: 25.0000 mg | ORAL_TABLET | Freq: Two times a day (BID) | ORAL | 0 refills | Status: DC

## 2020-06-23 MED ORDER — ISOSORBIDE MONONITRATE ER 30 MG PO TB24: 30.0000 mg | ORAL_TABLET | Freq: Every day | ORAL | 0 refills | Status: DC

## 2020-06-23 MED ORDER — HYDRALAZINE HCL 50 MG PO TABS: 50.0000 mg | ORAL_TABLET | Freq: Three times a day (TID) | ORAL | 0 refills | Status: DC

## 2020-06-23 MED ORDER — NICOTINE 14 MG/24HR TD PT24: 14.0000 mg | MEDICATED_PATCH | Freq: Every day | TRANSDERMAL | 0 refills | Status: DC

## 2020-06-23 MED ORDER — LOSARTAN POTASSIUM 50 MG PO TABS: 50.0000 mg | ORAL_TABLET | Freq: Every day | ORAL | 0 refills | Status: DC

## 2020-06-23 NOTE — Progress Notes (Signed)
Progress Note  Patient Name: Gregory Howell Date of Encounter: 06/23/2020   Primary [facility] Provider: [facility] Primary Cardiologist: Kirke Corin  Subjective   Feels well, no complaints, feels ready to go home No abdominal distention, no shortness of breath when walking around the floor Restarted on his blood pressure medications, numbers improved Reports he is going to go home and play with his 4 grandchildren ages 1 through 4  Inpatient Medications    Scheduled Meds: . apixaban  5 mg Oral BID  . atorvastatin  80 mg Oral Daily  . carvedilol  25 mg Oral BID WC  . hydrALAZINE  50 mg Oral Q8H  . isosorbide mononitrate  30 mg Oral Daily  . losartan  50 mg Oral Daily  . nicotine  14 mg Transdermal Daily   Continuous Infusions:  PRN Meds: acetaminophen, nitroGLYCERIN, ondansetron (ZOFRAN) IV   Vital Signs    Vitals:   06/23/20 0100 06/23/20 0500 06/23/20 0712 06/23/20 0900  BP: (!) 134/94  (!) 138/108 140/87  Pulse: 86  90 64  Resp: 18  16 14   Temp: 98.5 F (36.9 C)  98.1 F (36.7 C) 98.8 F (37.1 C)  TempSrc: Oral  Oral Oral  SpO2: 96%  97% 98%  Weight:  97.9 kg    Height:        Intake/Output Summary (Last 24 hours) at 06/23/2020 1246 Last data filed at 06/23/2020 06/25/2020 Gross per 24 hour  Intake 240 ml  Output 425 ml  Net -185 ml   Last 3 Weights 06/23/2020 06/22/2020 06/21/2020  Weight (lbs) 215 lb 13.3 oz 213 lb 5 oz 221 lb 12.8 oz  Weight (kg) 97.9 kg 96.758 kg 100.608 kg      Telemetry    Atrial fibrillation- Personally Reviewed  ECG     - Personally Reviewed  Physical Exam   Constitutional:  oriented to person, place, and time. No distress.  HENT:  Head: Grossly normal Eyes:  no discharge. No scleral icterus.  Neck: JVD 8+, no carotid bruits  Cardiovascular: Irregularly irregular, no murmurs appreciated Pulmonary/Chest: Clear to auscultation bilaterally, no wheezes or rails Abdominal: Soft.  no distension.  no tenderness.   Musculoskeletal: Normal range of motion Neurological:  normal muscle tone. Coordination normal. No atrophy Skin: Skin warm and dry Psychiatric: normal affect, pleasant   Labs    High Sensitivity Troponin:   Recent Labs  Lab 06/19/20 2118 06/19/20 2305 06/20/20 0437 06/20/20 0645  TROPONINIHS 44* 34* 28* 29*      Chemistry Recent Labs  Lab 06/19/20 1640 06/19/20 2118 06/20/20 0437 06/20/20 0437 06/21/20 0531 06/22/20 0424 06/23/20 0441  NA   < >  --  140   < > 137 138 136  K   < >  --  3.8   < > 3.4* 3.8 4.1  CL   < >  --  108   < > 101 102 100  CO2   < >  --  25   < > 26 27 26   GLUCOSE   < >  --  108*   < > 93 121* 104*  BUN   < >  --  18   < > 17 23 22   CREATININE   < >  --  1.37*   < > 1.37* 1.81* 1.41*  CALCIUM   < >  --  8.5*   < > 8.2* 8.7* 8.9  PROT  --  7.6 6.6  --   --   --   --  ALBUMIN  --  3.9 3.4*  --   --   --   --   AST  --  30 23  --   --   --   --   ALT  --  28 23  --   --   --   --   ALKPHOS  --  138* 130*  --   --   --   --   BILITOT  --  2.9* 2.3*  --   --   --   --   GFRNONAA   < >  --  59*   < > 59* 42* 57*  ANIONGAP   < >  --  7   < > 10 9 10    < > = values in this interval not displayed.     Hematology Recent Labs  Lab 06/21/20 0531 06/22/20 0424 06/23/20 0441  WBC 11.6* 12.1* 10.5  RBC 4.85 5.16 5.35  HGB 14.5 15.2 15.8  HCT 39.9 43.2 43.6  MCV 82.3 83.7 81.5  MCH 29.9 29.5 29.5  MCHC 36.3* 35.2 36.2*  RDW 16.9* 16.8* 16.9*  PLT 267 281 306    BNP Recent Labs  Lab 06/19/20 1640  BNP 1,063.1*     DDimer No results for input(s): DDIMER in the last 168 hours.   Radiology    No results found.  Cardiac Studies   Echocardiogram April 05, 2020  1. Left ventricular ejection fraction, by estimation, is 40 to 45%. The  left ventricle has mildly decreased function. Hypokinesis of basal  regions.There is moderate left ventricular hypertrophy. Left ventricular  diastolic parameters are indeterminate.  2. Right  ventricular systolic function is mildly reduced. The right  ventricular size is mildly enlarged. There is mildly elevated pulmonary  artery systolic pressure.  3. Left atrial size was severely dilated.  4. Right atrial size was moderately dilated.   Patient Profile     Mr. Heikkila is a 59 year old gentleman with history of nonischemic cardiomyopathy, pulmonary hypertension, persistent atrial fibrillation, hypertensive heart disease, medication noncompliance, presenting with acute on chronic systolic CHF  Assessment & Plan    Acute on chronic diastolic and systolic CHF Out of his medications past several weeks, some financial struggles, homeless --- Abdomen distended on presentation Similar presentation to prior admission for CHF exacerbation in early 2021 -Treated with IV Lasix, held yesterday secondary to prerenal state, creatinine up to 1.8 which is down to 1.4 today, close to his baseline -Restarted on outpatient regimen -Hospitalist has sent medications to medical management, meds to bed, into the 2022 mail order -Recommend he call our office if he is unable to receive his medications  Atrial fibrillation Continue carvedilol, Eliquis Rate stable   Hypertensive heart disease with congestive heart failure Has been out of medications for several weeks, Improving numbers on carvedilol 25 twice daily Losartan 50 daily  Imdur 30 daily  hydralazine 50 3 times daily -As an outpatient her blood pressure continues to run high could increase the Imdur up to 60 daily  Smoker Cessation recommended  Homeless Reports being homeless, though has numerous family members locally to help take care of him -Today reports he will go spend some time with his 4 grandchildren ages 1, 2, 3 and 4    Total encounter time more than 25 minutes  Greater than 50% was spent in counseling and coordination of care with the patient  For questions or updates, please contact [facility] Please  consult [email or web address] for contact  info under        Signed, Julien Nordmann, MD  06/23/2020, 12:46 PM

## 2020-06-23 NOTE — Progress Notes (Signed)
Advised pt that Dr Fran Lowes will sign a form of medical necessity/referral to [facility] for Physical Therapy; the form will be faxed to [facility] and they will call you at your home telephone number to set up his appointment.   The pt questioned durable medical equipment/straight cane that physical therapy here had him using.  The physical therapist just came and took it since he was being discharged home today.  The pt does not have a cane at home; message sent to Dr Fran Lowes for Rx for DME/cane; discharge pending resolving DME issue

## 2020-06-23 NOTE — Progress Notes (Signed)
Physical Therapy Treatment Patient Details Name: Gregory Howell MRN: [id number] DOB: 11/09/1960 Today's Date: 06/23/2020    History of Present Illness Patient is a 59 y.o. male with medical history significant of atrial fibrillation, chronic combined systolic diastolic CHF, CKD, alcohol abuse, hypertension, tobacco abuse. Patient presents with shortness of breath, worsening lower extremity edema, orthopena, CHF exacerbation and A. fib with RVR.    PT Comments    Patient has made significant progress with functional mobility and will be discharged from acute PT this session. Patient is ambulating independently in the room without assistive device and has been occasionally using cane for ambulation in hallway. Patient challenged with high level dynamic balance activity (see below for details) with no loss of balance. Patient anticipates discharge home today. Patient would benefit from cane for longer distance ambulation due to reported fatigue with prolonged activity and chronic back pain. No further acute PT needs at this time.    Follow Up Recommendations  Outpatient PT     Equipment Recommendations  Cane    Recommendations for Other Services       Precautions / Restrictions Precautions Precautions: None Restrictions Weight Bearing Restrictions: No    Mobility  Bed Mobility                  Transfers Overall transfer level: Independent Equipment used: None Transfers: Sit to/from Stand Sit to Stand: Independent         General transfer comment: multiple sit to stand transfers performed from recliner chair without difficulty   Ambulation/Gait Ambulation/Gait assistance: Independent Gait Distance (Feet): 20 Feet Assistive device: None Gait Pattern/deviations: WFL(Within Functional Limits)     General Gait Details: no loss of balance with ambulation in room. patient declined ambulating in the hallway at this time, reporting he walked twice in the hallway today     Stairs             Wheelchair Mobility    Modified Rankin (Stroke Patients Only)       Balance                             High level balance activites: Other (comment);Direction changes;Head turns;Turns High Level Balance Comments: neuro-reeducation provided during high level dynamic balance activity including reaching outside base of support, head turns, picking up object from floor, functional ambulation. no loss of balance is demonstrated, supervision for safety             Cognition Arousal/Alertness: Awake/alert Behavior During Therapy: WFL for tasks assessed/performed Overall Cognitive Status: Within Functional Limits for tasks assessed                                        Exercises      General Comments        Pertinent Vitals/Pain Pain Assessment: No/denies pain    Home Living                      Prior Function            PT Goals (current goals can now be found in the care plan section) Acute Rehab PT Goals Patient Stated Goal: to be discharged  PT Goal Formulation: With patient Time For Goal Achievement: 06/23/20 Potential to Achieve Goals: Good Progress towards PT goals: Goals met/education completed, patient discharged from PT (  other: adequate for discharge )    Frequency           PT Plan      Co-evaluation              AM-PAC PT "6 Clicks" Mobility   Outcome Measure  Help needed turning from your back to your side while in a flat bed without using bedrails?: None Help needed moving from lying on your back to sitting on the side of a flat bed without using bedrails?: None Help needed moving to and from a bed to a chair (including a wheelchair)?: None Help needed standing up from a chair using your arms (e.g., wheelchair or bedside chair)?: None Help needed to walk in hospital room?: None Help needed climbing 3-5 steps with a railing? : None 6 Click Score: 24    End of  Session   Activity Tolerance: Patient tolerated treatment well Patient left: in chair;with call bell/phone within reach   PT Visit Diagnosis: Other abnormalities of gait and mobility (R26.89);Muscle weakness (generalized) (M62.81)     Time: 3557-3220 PT Time Calculation (min) (ACUTE ONLY): 12 min  Charges:  $Neuromuscular Re-education: 8-22 mins                    Gregory Howell, PT, MPT    Gregory Howell 06/23/2020, 11:07 AM

## 2020-06-23 NOTE — Progress Notes (Signed)
MD order received in [facility] to discharge pt home today; verbally reviewed AVS with pt; Rxs escribed to the [place] in [place], [place] and Dr Fran Lowes printed the Rxs in case the [place] does not receive the escribed Rxs; gave pt Rx medication for 2 week supply [facility] 06[facility]21/2020 for Coreg and Lasix; pt questioned a Rx for the out pt physical therapy; advised the pt I would ask and get back to him

## 2020-06-23 NOTE — Discharge Summary (Signed)
Physician Discharge Summary   Gregory Howell  male DOB: 1960/10/08  TMA:[id number]  [facility]  Admit date: 06/19/2020 Discharge date: 06/23/2020  Admitted From: home Disposition:  Wife's house CODE STATUS: Full code  Discharge Instructions    Diet - low sodium heart healthy   Complete by: As directed    Discharge instructions   Complete by: As directed    Cardiologist Dr. Mariah Milling had seen you during this hospitalization, and prescribed you many medications for your heart failure, please take them as directed.    2 weeks of Lasix and Coreg were provided to you on discharge.  The rest of the medications were [facility] to [facility]  I also provided you with printed prescriptions for all of your medications for you to bring to your [facility] doctor at your follow up appointment.   Dr. Darlin Priestly [facility] Course:  For full details, please see H&P, progress notes, consult notes and ancillary notes.  Briefly,  Gregory Randles Henryis a 59 y.o.malewith medical history significant of atrial fibrillation,chronic combined systolic diastolic CHF, CKD, alcohol abuse, hypertension, tobacco abuse, who presented withshortness of breath for past 2 days.   # Acute on chronic systolic and diastolic CHF exacerbation # NICM, RV dysfunction, pulm HTN # Medication non-compliance Presented with dyspnea, abdominal distension, BNP 1063.  No hypoxia.  Last TTE on 04/05/20 showed LVEF 40-45%.  Pt was confused about whether or not he was supposed to take water pills at home, and inconsistent with his history.  Pt was started on IV lasix 40 mg BID on presentation.  Cardiology was consulted on admission who rec continued IV lasix 40 mg BID until Cr bumped on 11/13.  Home coreg and losartan continued.  Pt was discharged on oral Lasix 40 mg daily.  # AKI, not POA # CKD stage 2 Cr went up to 1.81 on 11/13, from 1.37 the day before.  Likely due to over-diuresis.  Lasix  held on 11/13, and Cr improved to 1.41 the next day.    # Nonobstructive CAD # Mild trop elevation due to demand ischemia No chest pain.  Recent LHC in 11/2019 showed nonobstructive CAD.  Pt likely was not taking any meds prior to presentation.  Pt was resumed on coreg, losartan, Imdur and Lipitor.  # Persistent Afib # Acquired thrombophilia Rate controlled.  Pt continued on coreg and Eliquis.  # HTN Pt continued on coreg, losartan, Imdur and hydralazine.  Was on IV lasix during hospitalization and discharged on oral Lasix 40 mg daily.  # Tobacco use nicotine patch provided.  # Homelessness Different stories to different providers, said he was homeless, said he was moving to [facility], said he was living in his wife's car, said he has an address that's his mother-in-law's house.  TOC to help find shelter with [facility] and provide a list of resources.  2 weeks of essential meds (lasix and coreg) provided by med management at discharge, rest send to [facility] pharmacy and also provided as printed Rx.   Discharge Diagnoses:  Principal Problem:   Acute on chronic HFrEF (heart failure with reduced ejection fraction) (HCC) Active Problems:   Hypertensive urgency   Tobacco abuse   Leukocytosis   Atrial fibrillation with RVR (HCC)   CKD (chronic kidney disease), stage III [facility])    Discharge Instructions:  Allergies as of 06/23/2020   No Known Allergies     Medication List  TAKE these medications   apixaban 5 MG Tabs tablet Commonly known as: ELIQUIS Take 1 tablet (5 mg total) by mouth 2 (two) times daily.   aspirin 81 MG EC tablet Take 1 tablet (81 mg total) by mouth daily.   atorvastatin 80 MG tablet Commonly known as: LIPITOR Take 1 tablet (80 mg total) by mouth daily.   carvedilol 25 MG tablet Commonly known as: COREG Take 1 tablet (25 mg total) by mouth 2 (two) times daily with a meal.   furosemide 40 MG tablet Commonly known as: LASIX Take 1 tablet (40 mg total) by mouth  daily. What changed: additional instructions   hydrALAZINE 50 MG tablet Commonly known as: APRESOLINE Take 1 tablet (50 mg total) by mouth every 8 (eight) hours.   isosorbide mononitrate 30 MG 24 hr tablet Commonly known as: IMDUR Take 1 tablet (30 mg total) by mouth daily.   losartan 50 MG tablet Commonly known as: COZAAR Take 1 tablet (50 mg total) by mouth daily.   methocarbamol 500 MG tablet Commonly known as: ROBAXIN Take 1 tablet (500 mg total) by mouth every 8 (eight) hours as needed for muscle spasms (cramping).   nicotine 14 mg/24hr patch Commonly known as: NICODERM CQ - dosed in mg/24 hours Place 1 patch (14 mg total) onto the skin daily. Start taking on: June 24, 2020        Follow-up Information    [facility] Follow up on 07/02/2020.   Specialty: Cardiology Why: at 2:00pm. Enter through the Medical Mall entrance Contact information: [address] [place] [address] [phone]       Your doctor at [facility] hospital. Schedule an appointment as soon as possible for a visit in 1 week(s).               No Known Allergies   The results of significant diagnostics from this hospitalization (including imaging, microbiology, ancillary and laboratory) are listed below for reference.   Consultations:   Procedures/Studies: DG Chest 2 View  Result Date: 06/19/2020 CLINICAL DATA:  Worsening shortness of breath for 2 days, atrial fibrillation EXAM: CHEST - 2 VIEW COMPARISON:  04/04/2020 FINDINGS: Frontal and lateral views of the chest demonstrates stable enlargement of the cardiac silhouette. Increased central vascular congestion and interstitial prominence compatible with edema. Trace bilateral pleural effusions. No pneumothorax. No acute bony abnormalities. IMPRESSION: 1. Developing interstitial edema with small bilateral pleural effusions. Electronically Signed   By: Sharlet Salina M.D.    On: 06/19/2020 17:44      Labs: BNP (last 3 results) Recent Labs    12/03/19 2152 04/04/20 1713 06/19/20 1640  BNP 660.0* 694.3* 1,063.1*   Basic Metabolic Panel: Recent Labs  Lab 06/19/20 1640 06/20/20 0437 06/21/20 0531 06/22/20 0424 06/23/20 0441  NA 141 140 137 138 136  K 4.2 3.8 3.4* 3.8 4.1  CL 108 108 101 102 100  CO2 24 25 26 27 26   GLUCOSE 106* 108* 93 121* 104*  BUN 18 18 17 23 22   CREATININE 1.34* 1.37* 1.37* 1.81* 1.41*  CALCIUM 8.7* 8.5* 8.2* 8.7* 8.9  MG  --  2.0 2.0 2.1 2.4   Liver Function Tests: Recent Labs  Lab 06/19/20 2118 06/20/20 0437  AST 30 23  ALT 28 23  ALKPHOS 138* 130*  BILITOT 2.9* 2.3*  PROT 7.6 6.6  ALBUMIN 3.9 3.4*   No results for input(s): LIPASE, AMYLASE in the last 168 hours. No results  for input(s): AMMONIA in the last 168 hours. CBC: Recent Labs  Lab 06/19/20 1640 06/20/20 0437 06/21/20 0531 06/22/20 0424 06/23/20 0441  WBC 13.4* 12.3* 11.6* 12.1* 10.5  NEUTROABS  --  9.5*  --   --   --   HGB 15.2 13.8 14.5 15.2 15.8  HCT 42.5 37.9* 39.9 43.2 43.6  MCV 82.2 82.6 82.3 83.7 81.5  PLT 297 260 267 281 306   Cardiac Enzymes: No results for input(s): CKTOTAL, CKMB, CKMBINDEX, TROPONINI in the last 168 hours. BNP: Invalid input(s): POCBNP CBG: No results for input(s): GLUCAP in the last 168 hours. D-Dimer No results for input(s): DDIMER in the last 72 hours. Hgb A1c No results for input(s): HGBA1C in the last 72 hours. Lipid Profile No results for input(s): CHOL, HDL, LDLCALC, TRIG, CHOLHDL, LDLDIRECT in the last 72 hours. Thyroid function studies No results for input(s): TSH, T4TOTAL, T3FREE, THYROIDAB in the last 72 hours.  Invalid input(s): FREET3 Anemia work up No results for input(s): VITAMINB12, FOLATE, FERRITIN, TIBC, IRON, RETICCTPCT in the last 72 hours. Urinalysis    Component Value Date/Time   COLORURINE STRAW (A) 06/19/2020 2305   APPEARANCEUR CLEAR (A) 06/19/2020 2305   LABSPEC 1.004 (L)  06/19/2020 2305   PHURINE 6.0 06/19/2020 2305   GLUCOSEU NEGATIVE 06/19/2020 2305   HGBUR NEGATIVE 06/19/2020 2305   BILIRUBINUR NEGATIVE 06/19/2020 2305   KETONESUR NEGATIVE 06/19/2020 2305   PROTEINUR NEGATIVE 06/19/2020 2305   NITRITE NEGATIVE 06/19/2020 2305   LEUKOCYTESUR NEGATIVE 06/19/2020 2305   Sepsis Labs Invalid input(s): PROCALCITONIN,  WBC,  LACTICIDVEN Microbiology Recent Results (from the past 240 hour(s))  Respiratory Panel by RT PCR (Flu A&B, Covid) - Nasopharyngeal Swab     Status: None   Collection Time: 06/19/20  9:18 PM   Specimen: Nasopharyngeal Swab  Result Value Ref Range Status   SARS Coronavirus 2 by RT PCR NEGATIVE NEGATIVE Final    Comment: (NOTE) SARS-CoV-2 target nucleic acids are NOT DETECTED.  The SARS-CoV-2 RNA is generally detectable in upper respiratoy specimens during the acute phase of infection. The lowest concentration of SARS-CoV-2 viral copies this assay can detect is 131 copies/mL. A negative result does not preclude SARS-Cov-2 infection and should not be used as the sole basis for treatment or other patient management decisions. A negative result may occur with  improper specimen collection/handling, submission of specimen other than nasopharyngeal swab, presence of viral mutation(s) within the areas targeted by this assay, and inadequate number of viral copies (<131 copies/mL). A negative result must be combined with clinical observations, patient history, and epidemiological information. The expected result is Negative.  Fact Sheet for Patients:  https://www.moore.com/  Fact Sheet for Healthcare Providers:  https://www.young.biz/  This test is no t yet approved or cleared by the [place] FDA and  has been authorized for detection and/or diagnosis of SARS-CoV-2 by FDA under an Emergency Use Authorization (EUA). This EUA will remain  in effect (meaning this test can be used) for the  duration of the COVID-19 declaration under Section 564(b)(1) of the Act, 21 U.S.C. section 360bbb-3(b)(1), unless the authorization is terminated or revoked sooner.     Influenza A by PCR NEGATIVE NEGATIVE Final   Influenza B by PCR NEGATIVE NEGATIVE Final    Comment: (NOTE) The Xpert Xpress SARS-CoV-2/FLU/RSV assay is intended as an aid in  the diagnosis of influenza from Nasopharyngeal swab specimens and  should not be used as a sole basis for treatment. Nasal washings and  aspirates are  unacceptable for Xpert Xpress SARS-CoV-2/FLU/RSV  testing.  Fact Sheet for Patients: https://www.moore.com/  Fact Sheet for Healthcare Providers: https://www.young.biz/  This test is not yet approved or cleared by the [place] FDA and  has been authorized for detection and/or diagnosis of SARS-CoV-2 by  FDA under an Emergency Use Authorization (EUA). This EUA will remain  in effect (meaning this test can be used) for the duration of the  Covid-19 declaration under Section 564(b)(1) of the Act, 21  U.S.C. section 360bbb-3(b)(1), unless the authorization is  terminated or revoked. Performed at [facility], [address] [place], [place] [address]      Total time spend on discharging this patient, including the last patient exam, discussing the hospital stay, instructions for ongoing care as it relates to all pertinent caregivers, as well as preparing the medical discharge records, prescriptions, and/or referrals as applicable, is 40 minutes.    Darlin Priestly, MD  [facility] 06/23/2020, 9:09 AM

## 2020-06-23 NOTE — Progress Notes (Signed)
Pt's ride present at the Medical Mall entrance; the pt verbalized that he could not wait on the cane to be delivered to his room; I advised the pt that I would see if the cane could be there for him at his 1st appt at [facility] rehab services PT appt;  The pt said that would be fine. Pt discharged via wheelchair by auxillary to the Medical Mall entrance

## 2020-06-23 NOTE — Progress Notes (Signed)
TOC to bring referral form for the MD to sign in order for the pt to go to out pt PT and place the form on his chart; verified with the pt that he would like to come here to [facility] out patient rehab services for physical therapy; advised the pt the progress of his discharge status

## 2020-06-23 NOTE — Plan of Care (Signed)

## 2020-06-23 NOTE — TOC Progression Note (Signed)
Transition of Care [facility]) - Progression Note    Patient Details  Name: Gregory Howell MRN: [id number] Date of Birth: 10-10-1960  Transition of Care [facility]) CM/SW Contact  Eilleen Kempf, LCSW Phone Number: 06/23/2020, 1:03 PM  Clinical Narrative:    CSW made out patient referral for patient to receive physical therapy, placed on patient's chart for Dr. Lenox Ponds.   Expected Discharge Plan: Home/Self Care Barriers to Discharge: Continued Medical Work up  Expected Discharge Plan and Services Expected Discharge Plan: Home/Self Care   Discharge Planning Services: CM Consult, Other - See comment (Community resources)   Living arrangements for the past 2 months: Homeless Expected Discharge Date: 06/23/20                                     Social Determinants of Health (SDOH) Interventions    Readmission Risk Interventions No flowsheet data found.

## 2020-06-24 ENCOUNTER — Telehealth: Payer: Self-pay | Admitting: Cardiovascular Disease

## 2020-06-24 NOTE — Telephone Encounter (Signed)
Attempted to schedule.  LMOV to call office.  ° °

## 2020-06-24 NOTE — Telephone Encounter (Signed)
-----   Message from Antonieta Iba, MD sent at 06/23/2020 12:53 PM EST ----- Leaving the hospital, admission for CHF Needs follow-up appointment , APP ok Thx TG

## 2020-07-01 NOTE — Telephone Encounter (Signed)
Attempted to schedule.  LMOV to call office.  ° °

## 2020-07-02 ENCOUNTER — Ambulatory Visit: Payer: No Typology Code available for payment source | Admitting: Family

## 2020-07-02 NOTE — Progress Notes (Deleted)
   Patient ID: Gregory Howell, male    DOB: [birth year], 58 y.o.   MRN: [id number]  HPI  Gregory Howell is a 59 y/o male with a history of  Echo report from 04/05/20 reviewed and showed an EF of 40-45% along with moderate LVH, severe LAE, severe RAE and mildly elevated PA pressure.   RHC/LHC done 12/07/19 showed:  2nd Mrg lesion is 100% stenosed.  Dist Cx lesion is 60% stenosed.  1st Mrg lesion is 40% stenosed.  Ost RCA lesion is 50% stenosed.  RPAV lesion is 60% stenosed.  Mid RCA lesion is 30% stenosed.  LV end diastolic pressure is mildly elevated.  There is severe left ventricular systolic dysfunction.  The left ventricular ejection fraction is less than 25% by visual estimate.  Hemodynamic findings consistent with mild pulmonary hypertension.  Admitted 06/19/20 due to acute on chronic HF. Cardiology consult obtained. Initially given IV lasix with transition to oral diuretics. Elevated troponin thought to be due to demand ischemia. Discharged after 4 days.   He presents today for his initial visit with a chief complaint of   Review of Systems    Physical Exam    Assessment & Plan:  1: Chronic heart failure with reduced ejection fraction- - NYHA class - [facility] cardiology saw patient during recent admission; f/u needs to be scheduled - BNP 06/19/20 was 1063.1  2: HTN- - BP - sees PCP at the [facility] - BMP 06/23/20 reviewed and showed sodium 136, potassium 4.1, creatinine 1.41 and GFR 57  3: Atrial fibrillation-

## 2020-07-10 NOTE — Telephone Encounter (Signed)
LVM for patient to schedule overdue fu 

## 2020-07-11 ENCOUNTER — Other Ambulatory Visit: Payer: Self-pay

## 2020-07-11 ENCOUNTER — Emergency Department: Payer: No Typology Code available for payment source

## 2020-07-11 ENCOUNTER — Observation Stay
Admission: EM | Admit: 2020-07-11 | Discharge: 2020-07-12 | Disposition: A | Discharge status: Home / Self Care | Payer: No Typology Code available for payment source | Attending: Internal Medicine | Admitting: Internal Medicine

## 2020-07-11 DIAGNOSIS — I4891 Unspecified atrial fibrillation: Secondary | ICD-10-CM | POA: Insufficient documentation

## 2020-07-11 DIAGNOSIS — Z20822 Contact with and (suspected) exposure to covid-19: Secondary | ICD-10-CM | POA: Insufficient documentation

## 2020-07-11 DIAGNOSIS — Z79899 Other long term (current) drug therapy: Secondary | ICD-10-CM | POA: Insufficient documentation

## 2020-07-11 DIAGNOSIS — I2510 Atherosclerotic heart disease of native coronary artery without angina pectoris: Secondary | ICD-10-CM | POA: Insufficient documentation

## 2020-07-11 DIAGNOSIS — E8779 Other fluid overload: Secondary | ICD-10-CM | POA: Diagnosis present

## 2020-07-11 DIAGNOSIS — I160 Hypertensive urgency: Secondary | ICD-10-CM | POA: Diagnosis not present

## 2020-07-11 DIAGNOSIS — Z9114 Patient's other noncompliance with medication regimen: Secondary | ICD-10-CM

## 2020-07-11 DIAGNOSIS — I428 Other cardiomyopathies: Secondary | ICD-10-CM

## 2020-07-11 DIAGNOSIS — I5043 Acute on chronic combined systolic (congestive) and diastolic (congestive) heart failure: Secondary | ICD-10-CM | POA: Diagnosis not present

## 2020-07-11 DIAGNOSIS — Z7982 Long term (current) use of aspirin: Secondary | ICD-10-CM | POA: Insufficient documentation

## 2020-07-11 DIAGNOSIS — Z91148 Patient's other noncompliance with medication regimen for other reason: Secondary | ICD-10-CM

## 2020-07-11 DIAGNOSIS — I130 Hypertensive heart and chronic kidney disease with heart failure and stage 1 through stage 4 chronic kidney disease, or unspecified chronic kidney disease: Secondary | ICD-10-CM | POA: Diagnosis not present

## 2020-07-11 DIAGNOSIS — J45909 Unspecified asthma, uncomplicated: Secondary | ICD-10-CM | POA: Insufficient documentation

## 2020-07-11 DIAGNOSIS — R0602 Shortness of breath: Secondary | ICD-10-CM | POA: Diagnosis present

## 2020-07-11 DIAGNOSIS — N183 Chronic kidney disease, stage 3 unspecified: Secondary | ICD-10-CM | POA: Diagnosis not present

## 2020-07-11 DIAGNOSIS — I4819 Other persistent atrial fibrillation: Secondary | ICD-10-CM | POA: Diagnosis present

## 2020-07-11 DIAGNOSIS — Z7901 Long term (current) use of anticoagulants: Secondary | ICD-10-CM | POA: Diagnosis not present

## 2020-07-11 DIAGNOSIS — I509 Heart failure, unspecified: Secondary | ICD-10-CM

## 2020-07-11 DIAGNOSIS — F17210 Nicotine dependence, cigarettes, uncomplicated: Secondary | ICD-10-CM | POA: Diagnosis not present

## 2020-07-11 LAB — BASIC METABOLIC PANEL
Anion gap: 12 (ref 5–15)
BUN: 20 mg/dL (ref 8–23)
CO2: 25 mmol/L (ref 22–32)
Calcium: 8.9 mg/dL (ref 8.9–10.3)
Chloride: 103 mmol/L (ref 98–111)
Creatinine, Ser: 1.35 mg/dL — ABNORMAL HIGH (ref 0.61–1.24)
GFR, Estimated: 60 mL/min — ABNORMAL LOW (ref >60)
Glucose, Bld: 85 mg/dL (ref 70–99)
Potassium: 4.3 mmol/L (ref 3.5–5.1)
Sodium: 140 mmol/L (ref 135–145)

## 2020-07-11 LAB — CBC
HCT: 43 % (ref 39.0–52.0)
Hemoglobin: 15.4 g/dL (ref 13.0–17.0)
MCH: 29.4 pg (ref 26.0–34.0)
MCHC: 35.8 g/dL (ref 30.0–36.0)
MCV: 82.2 fL (ref 80.0–100.0)
Platelets: 230 10*3/uL (ref 150–400)
RBC: 5.23 MIL/uL (ref 4.22–5.81)
RDW: 16.7 % — ABNORMAL HIGH (ref 11.5–15.5)
WBC: 12.2 10*3/uL — ABNORMAL HIGH (ref 4.0–10.5)
nRBC: 0 % (ref 0.0–0.2)

## 2020-07-11 LAB — RESP PANEL BY RT-PCR (FLU A&B, COVID) ARPGX2
Influenza A by PCR: NEGATIVE
Influenza B by PCR: NEGATIVE
SARS Coronavirus 2 by RT PCR: NEGATIVE

## 2020-07-11 LAB — BRAIN NATRIURETIC PEPTIDE: B Natriuretic Peptide: 1217.1 pg/mL — ABNORMAL HIGH (ref 0.0–100.0)

## 2020-07-11 LAB — TROPONIN I (HIGH SENSITIVITY)
Troponin I (High Sensitivity): 18 ng/L — ABNORMAL HIGH (ref <18)
Troponin I (High Sensitivity): 17 ng/L (ref <18)

## 2020-07-11 MED ORDER — LOSARTAN POTASSIUM 50 MG PO TABS
50.0000 mg | ORAL_TABLET | Freq: Every day | ORAL | Status: DC
  Administered 2020-07-12 (×2): 50 mg via ORAL
  Filled 2020-07-11 (×2): qty 1

## 2020-07-11 MED ORDER — METHOCARBAMOL 500 MG PO TABS
500.0000 mg | ORAL_TABLET | Freq: Three times a day (TID) | ORAL | Status: DC | PRN
  Administered 2020-07-12: 500 mg via ORAL
  Filled 2020-07-11 (×4): qty 1

## 2020-07-11 MED ORDER — APIXABAN 5 MG PO TABS
5.0000 mg | ORAL_TABLET | Freq: Two times a day (BID) | ORAL | Status: DC
  Administered 2020-07-11 – 2020-07-12 (×2): 5 mg via ORAL
  Filled 2020-07-11 (×2): qty 1

## 2020-07-11 MED ORDER — CARVEDILOL 6.25 MG PO TABS
25.0000 mg | ORAL_TABLET | Freq: Two times a day (BID) | ORAL | Status: DC
  Administered 2020-07-12: 25 mg via ORAL

## 2020-07-11 MED ORDER — ATORVASTATIN CALCIUM 80 MG PO TABS
80.0000 mg | ORAL_TABLET | Freq: Every day | ORAL | Status: DC
  Administered 2020-07-12 (×2): 80 mg via ORAL
  Filled 2020-07-11 (×2): qty 1

## 2020-07-11 MED ORDER — ASPIRIN EC 81 MG PO TBEC
81.0000 mg | DELAYED_RELEASE_TABLET | Freq: Every day | ORAL | Status: DC
  Administered 2020-07-12 (×2): 81 mg via ORAL
  Filled 2020-07-11 (×2): qty 1

## 2020-07-11 MED ORDER — NICOTINE 14 MG/24HR TD PT24: 14.0000 mg | MEDICATED_PATCH | Freq: Every day | TRANSDERMAL | Status: DC

## 2020-07-11 MED ORDER — FUROSEMIDE 10 MG/ML IJ SOLN
60.0000 mg | Freq: Once | INTRAMUSCULAR | Status: AC
  Administered 2020-07-11: 60 mg via INTRAVENOUS
  Filled 2020-07-11: qty 8

## 2020-07-11 MED ORDER — FUROSEMIDE 10 MG/ML IJ SOLN
40.0000 mg | Freq: Two times a day (BID) | INTRAMUSCULAR | Status: DC
  Administered 2020-07-12: 40 mg via INTRAVENOUS
  Filled 2020-07-11: qty 4

## 2020-07-11 MED ORDER — APIXABAN 5 MG PO TABS: 5.0000 mg | ORAL_TABLET | Freq: Two times a day (BID) | ORAL | Status: DC

## 2020-07-11 MED ORDER — ONDANSETRON HCL 4 MG/2ML IJ SOLN: 4.0000 mg | Freq: Four times a day (QID) | INTRAMUSCULAR | Status: DC | PRN

## 2020-07-11 MED ORDER — HYDRALAZINE HCL 50 MG PO TABS
50.0000 mg | ORAL_TABLET | Freq: Three times a day (TID) | ORAL | Status: DC
  Administered 2020-07-12 (×2): 50 mg via ORAL
  Filled 2020-07-11 (×2): qty 1

## 2020-07-11 MED ORDER — HYDRALAZINE HCL 50 MG PO TABS
50.0000 mg | ORAL_TABLET | Freq: Once | ORAL | Status: AC
  Administered 2020-07-11: 50 mg via ORAL
  Filled 2020-07-11: qty 1

## 2020-07-11 MED ORDER — ACETAMINOPHEN 325 MG PO TABS
650.0000 mg | ORAL_TABLET | ORAL | Status: DC | PRN
  Administered 2020-07-12: 650 mg via ORAL
  Filled 2020-07-11: qty 2

## 2020-07-11 MED ORDER — ISOSORBIDE MONONITRATE ER 60 MG PO TB24
30.0000 mg | ORAL_TABLET | Freq: Every day | ORAL | Status: DC
  Administered 2020-07-12 (×2): 30 mg via ORAL
  Filled 2020-07-11 (×2): qty 1

## 2020-07-11 MED ORDER — CARVEDILOL 6.25 MG PO TABS
25.0000 mg | ORAL_TABLET | Freq: Two times a day (BID) | ORAL | Status: DC
  Filled 2020-07-11: qty 4

## 2020-07-11 MED ORDER — SODIUM CHLORIDE 0.9 % IV SOLN: 250.0000 mL | INTRAVENOUS | Status: DC | PRN

## 2020-07-11 MED ORDER — SODIUM CHLORIDE 0.9% FLUSH: 3.0000 mL | INTRAVENOUS | Status: DC | PRN

## 2020-07-11 MED ORDER — SODIUM CHLORIDE 0.9% FLUSH
3.0000 mL | Freq: Two times a day (BID) | INTRAVENOUS | Status: DC
  Administered 2020-07-11 – 2020-07-12 (×2): 3 mL via INTRAVENOUS

## 2020-07-11 NOTE — H&P (Addendum)
History and Physical    Gregory Howell CVE:[id number] DOB: October 23, 1960 DOA: 07/11/2020  PCP: [facility]   Patient coming from: Home  I have personally briefly reviewed patient's old medical records in [facility]  Chief Complaint: Shortness of breath                                Weight gain  HPI: Gregory Howell is a 59 y.o. male with medical history significant for chronic atrial fibrillation on anticoagulation, nonischemic cardiomyopathy, hypertensive heart disease, chronic combined systolic and diastolic dysfunction CHF, nonocclusive coronary artery disease and stage II chronic kidney disease who presents to the ER for evaluation of shortness of breath associated with orthopnea and an 8 pound weight gain in about 3 days.  Patient was recently hospitalized for acute CHF exacerbation and was treated with IV diuretics and discharged home on Lasix.  He states that he was unable to get his medications from the [place] and so has been out of his Lasix for couple of days.  He denies having any dietary discretion.  He denies having any chest pain, no palpitations, no diaphoresis, no nausea, no vomiting, no abdominal pain, no urinary symptoms, no lower extremity swelling, no dizziness, no fever, no chills, no changes in his bowel habits. Labs show sodium 140, potassium 4.3, chloride 103, bicarb 25, glucose 85, BUN 20, creatinine 1.35, calcium 8.9, BNP 1217, white count 12.2, hemoglobin 15.4, hematocrit 43.0, RDW 16.7, platelet count 230 Respiratory viral panel is negative Chest x-ray reviewed by me shows cardiomegaly and mild interstitial edema Twelve-lead EKG shows A. fib with a rapid ventricular rate, ST changes in the inferior lateral leads, LVH   ED Course: Patient is a 59 year old African-American male who presents to the ER for evaluation of shortness of breath, orthopnea and 8 pound weight gain over the last 3 days.  He was recently discharged from the hospital after treatment  for acute CHF.  Patient run out of his Lasix and is being admitted for acute on chronic combined systolic and diastolic dysfunction CHF.  He received a dose of Lasix 60 mg IV in the ER.  Review of Systems: As per HPI otherwise 10 point review of systems negative.    Past Medical History:  Diagnosis Date  . CKD (chronic kidney disease), stage II   . Coronary artery disease, non-occlusive   . HFrEF (heart failure with reduced ejection fraction) (HCC)   . Hypertensive heart disease   . NICM (nonischemic cardiomyopathy) (HCC)   . Persistent atrial fibrillation [facility])     Past Surgical History:  Procedure Laterality Date  . RIGHT/LEFT HEART CATH AND CORONARY ANGIOGRAPHY N/A 12/07/2019   Procedure: RIGHT/LEFT HEART CATH AND CORONARY ANGIOGRAPHY;  Surgeon: Antonieta Iba, MD;  Location: [facility] INVASIVE CV LAB;  Service: Cardiovascular;  Laterality: N/A;     reports that he has been smoking cigarettes. He has been smoking about 0.50 packs per day. He has never used smokeless tobacco. He reports current alcohol use of about 7.0 - 14.0 standard drinks of alcohol per week. He reports that he does not use drugs.  No Known Allergies  Family History  Problem Relation Age of Onset  . Hypertension Mother   . Hypertension Father   . CAD Brother   . Diabetes Brother      Prior to Admission medications   Medication Sig Start Date End Date Taking? Authorizing Provider  apixaban (ELIQUIS) 5 MG TABS tablet Take 1 tablet (5 mg total) by mouth 2 (two) times daily. 06/23/20 09/21/20  Darlin Priestly, MD  aspirin EC 81 MG EC tablet Take 1 tablet (81 mg total) by mouth daily. 11/03/16   Milagros Loll, MD  atorvastatin (LIPITOR) 80 MG tablet Take 1 tablet (80 mg total) by mouth daily. 06/23/20 09/21/20  Darlin Priestly, MD  carvedilol (COREG) 25 MG tablet Take 1 tablet (25 mg total) by mouth 2 (two) times daily with a meal. 06/23/20 09/21/20  Darlin Priestly, MD  furosemide (LASIX) 40 MG tablet Take 1 tablet (40 mg total) by  mouth daily. 06/23/20 09/21/20  Darlin Priestly, MD  hydrALAZINE (APRESOLINE) 50 MG tablet Take 1 tablet (50 mg total) by mouth every 8 (eight) hours. 06/23/20 09/21/20  Darlin Priestly, MD  isosorbide mononitrate (IMDUR) 30 MG 24 hr tablet Take 1 tablet (30 mg total) by mouth daily. 06/23/20 09/21/20  Darlin Priestly, MD  losartan (COZAAR) 50 MG tablet Take 1 tablet (50 mg total) by mouth daily. 06/23/20 09/21/20  Darlin Priestly, MD  methocarbamol (ROBAXIN) 500 MG tablet Take 1 tablet (500 mg total) by mouth every 8 (eight) hours as needed for muscle spasms (cramping). 04/07/20   Pennie Banter, DO  nicotine (NICODERM CQ - DOSED IN MG/24 HOURS) 14 mg/24hr patch Place 1 patch (14 mg total) onto the skin daily. 06/24/20 09/02/20  Darlin Priestly, MD    Physical Exam: Vitals:   07/11/20 1459 07/11/20 1500 07/11/20 2018  BP: (!) 175/116  (!) 164/117  Pulse: 95  87  Resp: 20  16  Temp: 98.8 F (37.1 C)    TempSrc: Oral    SpO2: 98%  99%  Weight:  98.9 kg   Height:  5\' 8"  (1.727 m)      Vitals:   07/11/20 1459 07/11/20 1500 07/11/20 2018  BP: (!) 175/116  (!) 164/117  Pulse: 95  87  Resp: 20  16  Temp: 98.8 F (37.1 C)    TempSrc: Oral    SpO2: 98%  99%  Weight:  98.9 kg   Height:  5\' 8"  (1.727 m)     Constitutional: NAD, alert and oriented x 3 Eyes: PERRL, lids and conjunctivae normal ENMT: Mucous membranes are moist.  Neck: normal, supple, no masses, no thyromegaly Respiratory: Crackles at the bases, faint wheezing. Normal respiratory effort. No accessory muscle use.  Cardiovascular: Irregularly irregular, tachycardic, no murmurs / rubs / gallops. No extremity edema. 2+ pedal pulses. No carotid bruits.  Abdomen: no tenderness, no masses palpated. No hepatosplenomegaly. Bowel sounds positive.  Musculoskeletal: no clubbing / cyanosis. No joint deformity upper and lower extremities.  Skin: no rashes, lesions, ulcers.  Neurologic: No gross focal neurologic deficit. Psychiatric: Normal mood and  affect.   Labs on Admission: I have personally reviewed following labs and imaging studies  CBC: Recent Labs  Lab 07/11/20 1505  WBC 12.2*  HGB 15.4  HCT 43.0  MCV 82.2  PLT 230   Basic Metabolic Panel: Recent Labs  Lab 07/11/20 1505  NA 140  K 4.3  CL 103  CO2 25  GLUCOSE 85  BUN 20  CREATININE 1.35*  CALCIUM 8.9   GFR: Estimated Creatinine Clearance: 65.5 mL/min (A) (by C-G formula based on SCr of 1.35 mg/dL (H)). Liver Function Tests: No results for input(s): AST, ALT, ALKPHOS, BILITOT, PROT, ALBUMIN in the last 168 hours. No results for input(s): LIPASE, AMYLASE in the last 168 hours. No results for input(s):  AMMONIA in the last 168 hours. Coagulation Profile: No results for input(s): INR, PROTIME in the last 168 hours. Cardiac Enzymes: No results for input(s): CKTOTAL, CKMB, CKMBINDEX, TROPONINI in the last 168 hours. BNP (last 3 results) No results for input(s): PROBNP in the last 8760 hours. HbA1C: No results for input(s): HGBA1C in the last 72 hours. CBG: No results for input(s): GLUCAP in the last 168 hours. Lipid Profile: No results for input(s): CHOL, HDL, LDLCALC, TRIG, CHOLHDL, LDLDIRECT in the last 72 hours. Thyroid Function Tests: No results for input(s): TSH, T4TOTAL, FREET4, T3FREE, THYROIDAB in the last 72 hours. Anemia Panel: No results for input(s): VITAMINB12, FOLATE, FERRITIN, TIBC, IRON, RETICCTPCT in the last 72 hours. Urine analysis:    Component Value Date/Time   COLORURINE STRAW (A) 06/19/2020 2305   APPEARANCEUR CLEAR (A) 06/19/2020 2305   LABSPEC 1.004 (L) 06/19/2020 2305   PHURINE 6.0 06/19/2020 2305   GLUCOSEU NEGATIVE 06/19/2020 2305   HGBUR NEGATIVE 06/19/2020 2305   BILIRUBINUR NEGATIVE 06/19/2020 2305   KETONESUR NEGATIVE 06/19/2020 2305   PROTEINUR NEGATIVE 06/19/2020 2305   NITRITE NEGATIVE 06/19/2020 2305   LEUKOCYTESUR NEGATIVE 06/19/2020 2305    Radiological Exams on Admission: DG Chest 2 View  Result Date:  07/11/2020 CLINICAL DATA:  Increasing shortness of breath in a patient with a history of congestive heart failure. EXAM: CHEST - 2 VIEW COMPARISON:  PA and lateral chest 06/19/2020. FINDINGS: There is cardiomegaly mild interstitial edema. No consolidative process. No pneumothorax or pleural fluid. No acute or focal bony abnormality. IMPRESSION: Cardiomegaly and mild interstitial edema. Electronically Signed   By: Drusilla Kanner M.D.   On: 07/11/2020 15:25    EKG: Independently reviewed.  Atrial fibrillation with rapid ventricular rate LVH ST changes in the inferolateral leads  Assessment/Plan Principal Problem:   Acute on chronic combined systolic (congestive) and diastolic (congestive) heart failure (HCC) Active Problems:   Hypertensive urgency   Atrial fibrillation with RVR (HCC)   Persistent atrial fibrillation (HCC)   NICM (nonischemic cardiomyopathy) (HCC)   Non compliance w medication regimen   Acute on chronic combined systolic and diastolic dysfunction CHF Patient presents for evaluation of worsening shortness of breath associated with orthopnea and an 8 pound weight gain in 3 days Last known LVEF is 40 to 45% from a 2D echocardiogram done in August Patient admits to medication noncompliance and ran out of his Lasix for the last couple of days Chest x-ray is consistent with CHF and BNP is elevated Place patient on Lasix 40 mg IV every 12 Maintain low-sodium diet and fluid restriction Continue carvedilol, losartan and hydralazine     Persistent atrial fibrillation Continue carvedilol for rate control Continue apixaban as primary prophylaxis for an acute stroke   Hypertensive urgency Most likely related to medication noncompliance Continue carvedilol, losartan, nitrates and hydralazine   Medication noncompliance We will request social worker case management consult for medication assistance   Coronary artery disease Continue statins, nitrates, beta-blocker and  aspirin  DVT prophylaxis: Eliquis Code Status: Full code Family Communication: Greater than 50% of time was spent discussing plan of care with patient at the bedside.  All questions and concerns have been addressed.  He verbalizes understanding and agrees with the plan. Disposition Plan: Back to previous home environment Consults called: Case management/social work    Lucile Shutters MD [facility]     07/11/2020, 10:11 PM

## 2020-07-11 NOTE — ED Notes (Signed)
Pt reports sob for 2-3 days.  No cough or fever.  No chest pain.  Pt has intermittent back pain   Pt released from [facility] 2 weeks ago with similar sx.

## 2020-07-11 NOTE — ED Provider Notes (Signed)
[facility] Emergency Department Provider Note    First MD Initiated Contact with Patient 07/11/20 1737     (approximate)  I have reviewed the triage vital signs and the nursing notes.   HISTORY  Chief Complaint Shortness of Breath    HPI Caylen Yardley is a 59 y.o. male below listed past medical history and recent hospitalization for CHF exacerbation presents to the ER for worsening shortness of breath chest tightness as well as orthopnea developing over the past several days.  States has had over 8 pound weight gain just in the past 48 hours.  Was recently admitted for IV diuresis and was discharged home with Lasix but ran out.  Has been unable to get his Lasix refilled at the [place] due to multiple social barriers including homelessness and lack of transportation.  States he been trying to get back to his primary doctors in [place] the [place].  Came in today because he was having worsening shortness of breath.    Past Medical History:  Diagnosis Date  . CKD (chronic kidney disease), stage II   . Coronary artery disease, non-occlusive   . HFrEF (heart failure with reduced ejection fraction) (HCC)   . Hypertensive heart disease   . NICM (nonischemic cardiomyopathy) (HCC)   . Persistent atrial fibrillation (HCC)    Family History  Problem Relation Age of Onset  . Hypertension Mother   . Hypertension Father   . CAD Brother   . Diabetes Brother    Past Surgical History:  Procedure Laterality Date  . RIGHT/LEFT HEART CATH AND CORONARY ANGIOGRAPHY N/A 12/07/2019   Procedure: RIGHT/LEFT HEART CATH AND CORONARY ANGIOGRAPHY;  Surgeon: Antonieta Iba, MD;  Location: [facility] INVASIVE CV LAB;  Service: Cardiovascular;  Laterality: N/A;   Patient Active Problem List   Diagnosis Date Noted  . Acute on chronic HFrEF (heart failure with reduced ejection fraction) (HCC) 06/19/2020  . CKD (chronic kidney disease), stage III (HCC) 06/19/2020  . Acute on chronic combined  systolic and diastolic CHF (congestive heart failure) (HCC) 04/05/2020  . Nonobstructive atherosclerosis of coronary artery 12/07/2019  . AKI (acute kidney injury) (HCC) 12/07/2019  . Acute HFrEF (heart failure with reduced ejection fraction) (HCC)   . Atrial fibrillation with RVR (HCC) 12/04/2019  . Substance induced mood disorder (HCC) 02/22/2017  . Alcohol intoxication (HCC) 02/22/2017  . Chest pain with moderate risk for cardiac etiology 11/02/2016  . Hypertensive urgency 11/02/2016  . Tobacco abuse 11/02/2016  . Leukocytosis 11/02/2016      Prior to Admission medications   Medication Sig Start Date End Date Taking? Authorizing Provider  apixaban (ELIQUIS) 5 MG TABS tablet Take 1 tablet (5 mg total) by mouth 2 (two) times daily. 06/23/20 09/21/20  Darlin Priestly, MD  aspirin EC 81 MG EC tablet Take 1 tablet (81 mg total) by mouth daily. 11/03/16   Milagros Loll, MD  atorvastatin (LIPITOR) 80 MG tablet Take 1 tablet (80 mg total) by mouth daily. 06/23/20 09/21/20  Darlin Priestly, MD  carvedilol (COREG) 25 MG tablet Take 1 tablet (25 mg total) by mouth 2 (two) times daily with a meal. 06/23/20 09/21/20  Darlin Priestly, MD  furosemide (LASIX) 40 MG tablet Take 1 tablet (40 mg total) by mouth daily. 06/23/20 09/21/20  Darlin Priestly, MD  hydrALAZINE (APRESOLINE) 50 MG tablet Take 1 tablet (50 mg total) by mouth every 8 (eight) hours. 06/23/20 09/21/20  Darlin Priestly, MD  isosorbide mononitrate (IMDUR) 30 MG 24 hr tablet Take  1 tablet (30 mg total) by mouth daily. 06/23/20 09/21/20  Darlin Priestly, MD  losartan (COZAAR) 50 MG tablet Take 1 tablet (50 mg total) by mouth daily. 06/23/20 09/21/20  Darlin Priestly, MD  methocarbamol (ROBAXIN) 500 MG tablet Take 1 tablet (500 mg total) by mouth every 8 (eight) hours as needed for muscle spasms (cramping). 04/07/20   Pennie Banter, DO  nicotine (NICODERM CQ - DOSED IN MG/24 HOURS) 14 mg/24hr patch Place 1 patch (14 mg total) onto the skin daily. 06/24/20 09/02/20  Darlin Priestly, MD     Allergies Patient has no known allergies.    Social History Social History   Tobacco Use  . Smoking status: Current Some Day Smoker    Packs/day: 0.50    Types: Cigarettes  . Smokeless tobacco: Never Used  Substance Use Topics  . Alcohol use: Yes    Alcohol/week: 7.0 - 14.0 standard drinks    Types: 7 - 14 Cans of beer per week    Comment: daily  . Drug use: Never    Review of Systems Patient denies headaches, rhinorrhea, blurry vision, numbness, shortness of breath, chest pain, edema, cough, abdominal pain, nausea, vomiting, diarrhea, dysuria, fevers, rashes or hallucinations unless otherwise stated above in HPI. ____________________________________________   PHYSICAL EXAM:  VITAL SIGNS: Vitals:   07/11/20 1459  BP: (!) 175/116  Pulse: 95  Resp: 20  Temp: 98.8 F (37.1 C)  SpO2: 98%    Constitutional: Alert and oriented.  Eyes: Conjunctivae are normal.  Head: Atraumatic. Nose: No congestion/rhinnorhea. Mouth/Throat: Mucous membranes are moist.   Neck: No stridor. Painless ROM.  Cardiovascular: Normal rate, irregularly irregular rhythm. Grossly normal heart sounds.  Good peripheral circulation. Respiratory: Mild tachypnea, diminished posterior breath sounds with inspiratory crackles  gastrointestinal: Soft and nontender. No distention. No abdominal bruits. No CVA tenderness. Genitourinary:  Musculoskeletal: No lower extremity tenderness, BLE edema.  No joint effusions. Neurologic:  Normal speech and language. No gross focal neurologic deficits are appreciated. No facial droop Skin:  Skin is warm, dry and intact. No rash noted. Psychiatric: Mood and affect are normal. Speech and behavior are normal.  ____________________________________________   LABS (all labs ordered are listed, but only abnormal results are displayed)  Results for orders placed or performed during the hospital encounter of 07/11/20 (from the past 24 hour(s))  Basic metabolic panel      Status: Abnormal   Collection Time: 07/11/20  3:05 PM  Result Value Ref Range   Sodium 140 135 - 145 mmol/L   Potassium 4.3 3.5 - 5.1 mmol/L   Chloride 103 98 - 111 mmol/L   CO2 25 22 - 32 mmol/L   Glucose, Bld 85 70 - 99 mg/dL   BUN 20 8 - 23 mg/dL   Creatinine, Ser 5.00 (H) 0.61 - 1.24 mg/dL   Calcium 8.9 8.9 - 37.0 mg/dL   GFR, Estimated 60 (L) >60 mL/min   Anion gap 12 5 - 15  CBC     Status: Abnormal   Collection Time: 07/11/20  3:05 PM  Result Value Ref Range   WBC 12.2 (H) 4.0 - 10.5 K/uL   RBC 5.23 4.22 - 5.81 MIL/uL   Hemoglobin 15.4 13.0 - 17.0 g/dL   HCT 48.8 39 - 52 %   MCV 82.2 80.0 - 100.0 fL   MCH 29.4 26.0 - 34.0 pg   MCHC 35.8 30.0 - 36.0 g/dL   RDW 89.1 (H) 69.4 - 50.3 %   Platelets 230 150 -  400 K/uL   nRBC 0.0 0.0 - 0.2 %  Brain natriuretic peptide     Status: Abnormal   Collection Time: 07/11/20  3:05 PM  Result Value Ref Range   B Natriuretic Peptide 1,217.1 (H) 0.0 - 100.0 pg/mL   ____________________________________________  EKG My review and personal interpretation at Time: 14:55   Indication: sob  Rate: 115  Rhythm: afib Axis: normal Other: nonspecific st abn, no stemi ____________________________________________  RADIOLOGY  I personally reviewed all radiographic images ordered to evaluate for the above acute complaints and reviewed radiology reports and findings.  These findings were personally discussed with the patient.  Please see medical record for radiology report.  ____________________________________________   PROCEDURES  Procedure(s) performed:  Procedures    Critical Care performed: no ____________________________________________   INITIAL IMPRESSION / ASSESSMENT AND PLAN / ED COURSE  Pertinent labs & imaging results that were available during my care of the patient were reviewed by me and considered in my medical decision making (see chart for details).   DDX: Asthma, copd, CHF, pna, ptx, malignancy, Pe,  anemia   Oswin Griffith is a 59 y.o. who presents to the ED with presentation as described above.  Patient is nontoxic-appearing but is tachypneic and does appear to be having evidence of acute CHF exacerbation which I think is most likely secondary to noncompliance or nonaccess to his chronic medications.  With 8 pound weight gain over the past day or 2 I do feel that he is can require hospitalization for additional IV diuresis.  I am going to reach out to the [place] as I think that would probably be the most appropriate location for admission given the many social factors that are limiting his adherence to medication regimen.  If they are on diversion we will can admit here.     The patient was evaluated in Emergency Department today for the symptoms described in the history of present illness. He/she was evaluated in the context of the global COVID-19 pandemic, which necessitated consideration that the patient might be at risk for infection with the SARS-CoV-2 virus that causes COVID-19. Institutional protocols and algorithms that pertain to the evaluation of patients at risk for COVID-19 are in a state of rapid change based on information released by regulatory bodies including the CDC and federal and state organizations. These policies and algorithms were followed during the patient's care in the ED.  As part of my medical decision making, I reviewed the following data within the electronic MEDICAL RECORD NUMBER Nursing notes reviewed and incorporated, Labs reviewed, notes from prior ED visits and [place] Controlled Substance Database   ____________________________________________   FINAL CLINICAL IMPRESSION(S) / ED DIAGNOSES  Final diagnoses:  Acute on chronic congestive heart failure, unspecified heart failure type (HCC)      NEW MEDICATIONS STARTED DURING THIS VISIT:  New Prescriptions   No medications on file     Note:  This document was prepared using Dragon voice recognition software and  may include unintentional dictation errors.    Willy Eddy, MD 07/11/20 [id number]

## 2020-07-11 NOTE — ED Triage Notes (Addendum)
Pt here via POV from home.  Pt here with c/o increasing shortness of breath. Pt states he has heart failure, has been without his lasix. States he was unable to get his medication refilled and hasn't been able to take it for a week. Pt just got prescribed the lasix two weeks ago. Pt unable to tolerate laying flat, has noticed peripheral edema. Has calculated an 8lb weight gain.

## 2020-07-11 NOTE — ED Notes (Signed)
Admitting provider at bedside.

## 2020-07-12 DIAGNOSIS — I5043 Acute on chronic combined systolic (congestive) and diastolic (congestive) heart failure: Secondary | ICD-10-CM

## 2020-07-12 DIAGNOSIS — E8779 Other fluid overload: Secondary | ICD-10-CM | POA: Diagnosis present

## 2020-07-12 LAB — BASIC METABOLIC PANEL
Anion gap: 14 (ref 5–15)
BUN: 22 mg/dL (ref 8–23)
CO2: 27 mmol/L (ref 22–32)
Calcium: 8.6 mg/dL — ABNORMAL LOW (ref 8.9–10.3)
Chloride: 101 mmol/L (ref 98–111)
Creatinine, Ser: 1.47 mg/dL — ABNORMAL HIGH (ref 0.61–1.24)
GFR, Estimated: 54 mL/min — ABNORMAL LOW (ref >60)
Glucose, Bld: 89 mg/dL (ref 70–99)
Potassium: 3.5 mmol/L (ref 3.5–5.1)
Sodium: 142 mmol/L (ref 135–145)

## 2020-07-12 MED ORDER — FUROSEMIDE 40 MG PO TABS: 40.0000 mg | ORAL_TABLET | Freq: Every day | ORAL | 1 refills | Status: DC

## 2020-07-12 MED ORDER — FUROSEMIDE 10 MG/ML IJ SOLN: 40.0000 mg | Freq: Every day | INTRAMUSCULAR | Status: DC

## 2020-07-12 NOTE — Progress Notes (Addendum)
   Heart Failure Nurse Navigator Note  HFrEF 25-30%  Right ocular systolic function is severely decreased.  Mild mitral regurgitation.  Moderately elevated pulmonary arterial pressures   Co morbidities:  Nonobstructive coronary artery disease Persistent atrial fibrillation Hypertension Chronic kidney disease stage III  Medications:   Apixaban 5 mg twice daily Aspirin 81 mg daily Atorvastatin 80 mg daily Coreg 25 mg twice daily Furosemide 40 mg IV Hydralazine 50 mg every 8 hours Isosorbide mononitrate 30 mg daily Cozaar 50 mg daily  Labs:  Sodium 142, potassium 3.5, chloride 101, CO2 27, BUN 22, creatinine 1.47, hemoglobin 15.4 and hematocrit 43.  Bohnen 17, 18, BNP 1217   X-ray revealed cardiomegaly with mild interstitial edema.  Assessment:  General-he is awake and alert in supine on the gurney in the ED.  HEENT-pupils are equal non icteric.  Cardiac-heart tones are irregular  Chest - breath sounds are clear to posterior  auscultation   Abdomen soft nontender  Musculoskeletal-no lower extremity edema.   Patient states that he had ran out of his Lasix.  Prior to admission he had been weighing himself twice a day.  Noted that he was gaining 3 pounds overnight.  Also the night before admission he was up with complaints of orthopnea at least 10-15 times.  Discussed the importance of keeping his appointments at the heart failure clinic.  Explained that Inetta Fermo would be able to help him out with getting prescriptions for his medications if the [place] did not come through.  Explained to him that she is a good resource if he feels that he is getting into trouble that seeing her as a way to keep him out of the emergency room.  Voices understanding.  Has an appointment to see Clarisa Kindred FNP in the heart failure clinic on December 9 at 9:30 in the morning. This was discussed and stressed that he keep that appointment.   Tresa Endo RN [facility]

## 2020-07-12 NOTE — Discharge Summary (Signed)
Physician Discharge Summary  Gregory Howell LNL:[id number] DOB: 12-15-1960 DOA: 07/11/2020  PCP: [facility]  Admit date: 07/11/2020 Discharge date: 07/12/2020  Admitted From: Home Disposition: Home  Recommendations for Outpatient Follow-up:  1. Follow up with PCP in 1-2 weeks 2. Please obtain BMP/CBC in one week 3. Please follow up on the following pending results: None  Home Health: No Equipment/Devices: None Discharge Condition: Stable CODE STATUS: Full Diet recommendation: Heart Healthy / Carb Modified  Brief/Interim Summary: Gregory Howell is a 59 y.o. male with medical history significant for chronic atrial fibrillation on anticoagulation, nonischemic cardiomyopathy, hypertensive heart disease, chronic combined systolic and diastolic dysfunction CHF, nonocclusive coronary artery disease and stage II chronic kidney disease who presents to the ER for evaluation of shortness of breath associated with orthopnea and an 8 pound weight gain in about 3 days.  Patient was recently hospitalized for acute CHF exacerbation and was treated with IV diuretics and discharged home on Lasix.  He states that he was unable to get his medications from the [place] and so has been out of his Lasix for couple of days.  He denies having any dietary discretion.  BNP was elevated at 1217.  EKG with A. fib and RVR, chest x-ray with mild interstitial edema.  Patient received IV diuresis and responded pretty quickly. Next morning he appears at his baseline, heart rate within normal rate, no shortness of breath or chest pain.  Chest clear and no peripheral edema.  He diuresed very well overnight.  Had a long discussion with patient regarding being compliant with Lasix and follow-up with heart failure clinic. A new Lasix prescription was sent to [facility] where he can get it for $4 so he can use Lasix regularly till he will get his supply from [place]. patient seems understanding.  He will continue with rest of his  home medications and advised to follow-up with his primary care provider and heart failure clinic.  Discharge Diagnoses:  Principal Problem:   Acute on chronic combined systolic (congestive) and diastolic (congestive) heart failure (HCC) Active Problems:   Hypertensive urgency   Atrial fibrillation with RVR (HCC)   Persistent atrial fibrillation (HCC)   NICM (nonischemic cardiomyopathy) (HCC)   Non compliance w medication regimen   Volume overload state of heart    Discharge Instructions  Discharge Instructions    Diet - low sodium heart healthy   Complete by: As directed    Discharge instructions   Complete by: As directed    It was pleasure taking care of you. As we discussed it is very important that you take your medications very regularly which also includes your water pill called Lasix or furosemide.  A new prescription is being sent to [facility] where you can get it cheaper till you are waiting your supply to come from [place]. Please follow-up with your providers regularly.   Increase activity slowly   Complete by: As directed      Allergies as of 07/12/2020   No Known Allergies     Medication List    TAKE these medications   apixaban 5 MG Tabs tablet Commonly known as: ELIQUIS Take 1 tablet (5 mg total) by mouth 2 (two) times daily.   aspirin 81 MG EC tablet Take 1 tablet (81 mg total) by mouth daily.   atorvastatin 80 MG tablet Commonly known as: LIPITOR Take 1 tablet (80 mg total) by mouth daily.   carvedilol 25 MG tablet Commonly known as: COREG Take 1  tablet (25 mg total) by mouth 2 (two) times daily with a meal.   furosemide 40 MG tablet Commonly known as: LASIX Take 1 tablet (40 mg total) by mouth daily.   hydrALAZINE 50 MG tablet Commonly known as: APRESOLINE Take 1 tablet (50 mg total) by mouth every 8 (eight) hours.   isosorbide mononitrate 30 MG 24 hr tablet Commonly known as: IMDUR Take 1 tablet (30 mg total) by mouth daily.   losartan 50 MG  tablet Commonly known as: COZAAR Take 1 tablet (50 mg total) by mouth daily.   methocarbamol 500 MG tablet Commonly known as: ROBAXIN Take 1 tablet (500 mg total) by mouth every 8 (eight) hours as needed for muscle spasms (cramping).   nicotine 14 mg/24hr patch Commonly known as: NICODERM CQ - dosed in mg/24 hours Place 1 patch (14 mg total) onto the skin daily.       Follow-up Information    [facility] Follow up on 07/18/2020.   Specialty: Cardiology Why: at 9:30am. Enter through the Medical Mall entrance Contact information: [address] [place] [address] [phone]       [facility] Follow up.   Specialty: General Practice Contact information: [address] [place] [address] [phone]              No Known Allergies  Consultations:  None  Procedures/Studies: DG Chest 2 View  Result Date: 07/11/2020 CLINICAL DATA:  Increasing shortness of breath in a patient with a history of congestive heart failure. EXAM: CHEST - 2 VIEW COMPARISON:  PA and lateral chest 06/19/2020. FINDINGS: There is cardiomegaly mild interstitial edema. No consolidative process. No pneumothorax or pleural fluid. No acute or focal bony abnormality. IMPRESSION: Cardiomegaly and mild interstitial edema. Electronically Signed   By: Drusilla Kanner M.D.   On: 07/11/2020 15:25   DG Chest 2 View  Result Date: 06/19/2020 CLINICAL DATA:  Worsening shortness of breath for 2 days, atrial fibrillation EXAM: CHEST - 2 VIEW COMPARISON:  04/04/2020 FINDINGS: Frontal and lateral views of the chest demonstrates stable enlargement of the cardiac silhouette. Increased central vascular congestion and interstitial prominence compatible with edema. Trace bilateral pleural effusions. No pneumothorax. No acute bony abnormalities. IMPRESSION: 1. Developing interstitial edema with small bilateral pleural effusions.  Electronically Signed   By: Sharlet Salina M.D.   On: 06/19/2020 17:44     Subjective: Patient was feeling better when seen today.  No chest pain or shortness of breath.  He thinks he is at his baseline.  Had a long discussion regarding using Lasix very regularly and follow-up with heart failure clinic for further assistance.  Discharge Exam: Vitals:   07/12/20 1300 07/12/20 1400  BP:    Pulse: 80 78  Resp: 18 (!) 21  Temp:    SpO2: 96% 94%   Vitals:   07/12/20 0800 07/12/20 1200 07/12/20 1300 07/12/20 1400  BP: 139/80 (!) 125/99    Pulse: 87  80 78  Resp: 19  18 (!) 21  Temp: 97.7 F (36.5 C) 97.8 F (36.6 C)    TempSrc:      SpO2: 95%  96% 94%  Weight:      Height:        General: Pt is alert, awake, not in acute distress Cardiovascular: RRR, S1/S2 +, no rubs, no gallops Respiratory: CTA bilaterally, no wheezing, no rhonchi Abdominal: Soft, NT, ND, bowel sounds + Extremities: no edema, no cyanosis  The results of significant diagnostics from this hospitalization (including imaging, microbiology, ancillary and laboratory) are listed below for reference.    Microbiology: Recent Results (from the past 240 hour(s))  Resp Panel by RT-PCR (Flu A&B, Covid) Nasopharyngeal Swab     Status: None   Collection Time: 07/11/20  6:13 PM   Specimen: Nasopharyngeal Swab; Nasopharyngeal(NP) swabs in vial transport medium  Result Value Ref Range Status   SARS Coronavirus 2 by RT PCR NEGATIVE NEGATIVE Final    Comment: (NOTE) SARS-CoV-2 target nucleic acids are NOT DETECTED.  The SARS-CoV-2 RNA is generally detectable in upper respiratory specimens during the acute phase of infection. The lowest concentration of SARS-CoV-2 viral copies this assay can detect is 138 copies/mL. A negative result does not preclude SARS-Cov-2 infection and should not be used as the sole basis for treatment or other patient management decisions. A negative result may occur with  improper specimen  collection/handling, submission of specimen other than nasopharyngeal swab, presence of viral mutation(s) within the areas targeted by this assay, and inadequate number of viral copies(<138 copies/mL). A negative result must be combined with clinical observations, patient history, and epidemiological information. The expected result is Negative.  Fact Sheet for Patients:  BloggerCourse.com  Fact Sheet for Healthcare Providers:  SeriousBroker.it  This test is no t yet approved or cleared by the [place] FDA and  has been authorized for detection and/or diagnosis of SARS-CoV-2 by FDA under an Emergency Use Authorization (EUA). This EUA will remain  in effect (meaning this test can be used) for the duration of the COVID-19 declaration under Section 564(b)(1) of the Act, 21 U.S.C.section 360bbb-3(b)(1), unless the authorization is terminated  or revoked sooner.       Influenza A by PCR NEGATIVE NEGATIVE Final   Influenza B by PCR NEGATIVE NEGATIVE Final    Comment: (NOTE) The Xpert Xpress SARS-CoV-2/FLU/RSV plus assay is intended as an aid in the diagnosis of influenza from Nasopharyngeal swab specimens and should not be used as a sole basis for treatment. Nasal washings and aspirates are unacceptable for Xpert Xpress SARS-CoV-2/FLU/RSV testing.  Fact Sheet for Patients: BloggerCourse.com  Fact Sheet for Healthcare Providers: SeriousBroker.it  This test is not yet approved or cleared by the [place] FDA and has been authorized for detection and/or diagnosis of SARS-CoV-2 by FDA under an Emergency Use Authorization (EUA). This EUA will remain in effect (meaning this test can be used) for the duration of the COVID-19 declaration under Section 564(b)(1) of the Act, 21 U.S.C. section 360bbb-3(b)(1), unless the authorization is terminated or revoked.  Performed at [facility], [address] [place], [place] [address]      Labs: BNP (last 3 results) Recent Labs    04/04/20 1713 06/19/20 1640 07/11/20 1505  BNP 694.3* 1,063.1* 1,217.1*   Basic Metabolic Panel: Recent Labs  Lab 07/11/20 1505 07/12/20 0437  NA 140 142  K 4.3 3.5  CL 103 101  CO2 25 27  GLUCOSE 85 89  BUN 20 22  CREATININE 1.35* 1.47*  CALCIUM 8.9 8.6*   Liver Function Tests: No results for input(s): AST, ALT, ALKPHOS, BILITOT, PROT, ALBUMIN in the last 168 hours. No results for input(s): LIPASE, AMYLASE in the last 168 hours. No results for input(s): AMMONIA in the last 168 hours. CBC: Recent Labs  Lab 07/11/20 1505  WBC 12.2*  HGB 15.4  HCT 43.0  MCV 82.2  PLT 230   Cardiac Enzymes: No results for input(s): CKTOTAL, CKMB, CKMBINDEX,  TROPONINI in the last 168 hours. BNP: Invalid input(s): POCBNP CBG: No results for input(s): GLUCAP in the last 168 hours. D-Dimer No results for input(s): DDIMER in the last 72 hours. Hgb A1c No results for input(s): HGBA1C in the last 72 hours. Lipid Profile No results for input(s): CHOL, HDL, LDLCALC, TRIG, CHOLHDL, LDLDIRECT in the last 72 hours. Thyroid function studies No results for input(s): TSH, T4TOTAL, T3FREE, THYROIDAB in the last 72 hours.  Invalid input(s): FREET3 Anemia work up No results for input(s): VITAMINB12, FOLATE, FERRITIN, TIBC, IRON, RETICCTPCT in the last 72 hours. Urinalysis    Component Value Date/Time   COLORURINE STRAW (A) 06/19/2020 2305   APPEARANCEUR CLEAR (A) 06/19/2020 2305   LABSPEC 1.004 (L) 06/19/2020 2305   PHURINE 6.0 06/19/2020 2305   GLUCOSEU NEGATIVE 06/19/2020 2305   HGBUR NEGATIVE 06/19/2020 2305   BILIRUBINUR NEGATIVE 06/19/2020 2305   KETONESUR NEGATIVE 06/19/2020 2305   PROTEINUR NEGATIVE 06/19/2020 2305   NITRITE NEGATIVE 06/19/2020 2305   LEUKOCYTESUR NEGATIVE 06/19/2020 2305   Sepsis Labs Invalid input(s): PROCALCITONIN,  WBC,   LACTICIDVEN Microbiology Recent Results (from the past 240 hour(s))  Resp Panel by RT-PCR (Flu A&B, Covid) Nasopharyngeal Swab     Status: None   Collection Time: 07/11/20  6:13 PM   Specimen: Nasopharyngeal Swab; Nasopharyngeal(NP) swabs in vial transport medium  Result Value Ref Range Status   SARS Coronavirus 2 by RT PCR NEGATIVE NEGATIVE Final    Comment: (NOTE) SARS-CoV-2 target nucleic acids are NOT DETECTED.  The SARS-CoV-2 RNA is generally detectable in upper respiratory specimens during the acute phase of infection. The lowest concentration of SARS-CoV-2 viral copies this assay can detect is 138 copies/mL. A negative result does not preclude SARS-Cov-2 infection and should not be used as the sole basis for treatment or other patient management decisions. A negative result may occur with  improper specimen collection/handling, submission of specimen other than nasopharyngeal swab, presence of viral mutation(s) within the areas targeted by this assay, and inadequate number of viral copies(<138 copies/mL). A negative result must be combined with clinical observations, patient history, and epidemiological information. The expected result is Negative.  Fact Sheet for Patients:  BloggerCourse.com  Fact Sheet for Healthcare Providers:  SeriousBroker.it  This test is no t yet approved or cleared by the [place] FDA and  has been authorized for detection and/or diagnosis of SARS-CoV-2 by FDA under an Emergency Use Authorization (EUA). This EUA will remain  in effect (meaning this test can be used) for the duration of the COVID-19 declaration under Section 564(b)(1) of the Act, 21 U.S.C.section 360bbb-3(b)(1), unless the authorization is terminated  or revoked sooner.       Influenza A by PCR NEGATIVE NEGATIVE Final   Influenza B by PCR NEGATIVE NEGATIVE Final    Comment: (NOTE) The Xpert Xpress SARS-CoV-2/FLU/RSV plus  assay is intended as an aid in the diagnosis of influenza from Nasopharyngeal swab specimens and should not be used as a sole basis for treatment. Nasal washings and aspirates are unacceptable for Xpert Xpress SARS-CoV-2/FLU/RSV testing.  Fact Sheet for Patients: BloggerCourse.com  Fact Sheet for Healthcare Providers: SeriousBroker.it  This test is not yet approved or cleared by the [place] FDA and has been authorized for detection and/or diagnosis of SARS-CoV-2 by FDA under an Emergency Use Authorization (EUA). This EUA will remain in effect (meaning this test can be used) for the duration of the COVID-19 declaration under Section 564(b)(1) of the Act, 21 U.S.C. section 360bbb-3(b)(1), unless  the authorization is terminated or revoked.  Performed at [facility], [address] [place], [place] [address]     Time coordinating discharge: Over 30 minutes  SIGNED:  Arnetha Courser, MD  [facility] 07/12/2020, 2:22 PM  If 7PM-7AM, please contact night-coverage [email or web address]  This record has been created using Conservation officer, historic buildings. Errors have been sought and corrected,but may not always be located. Such creation errors do not reflect on the standard of care.

## 2020-07-12 NOTE — ED Notes (Signed)
Patient reporting muscle cramps. Pharmacy messaged requesting ordered Robaxin be tubed for administration.

## 2020-07-12 NOTE — Discharge Instructions (Signed)

## 2020-07-18 ENCOUNTER — Ambulatory Visit: Payer: No Typology Code available for payment source | Admitting: Family

## 2020-07-18 NOTE — Progress Notes (Deleted)
   Patient ID: Gregory Howell, male    DOB: 12-22-1960, 59 y.o.   MRN: [id number]  HPI  Gregory Howell is a 59 y/o male with a history of  Echo report from 04/05/20 reviewed and showed an EF of 40-45% along with moderate LVH, severe LAE, severe RAE and mildly elevated PA pressure.   RHC/LHC done 12/07/19 showed:  2nd Mrg lesion is 100% stenosed.  Dist Cx lesion is 60% stenosed.  1st Mrg lesion is 40% stenosed.  Ost RCA lesion is 50% stenosed.  RPAV lesion is 60% stenosed.  Mid RCA lesion is 30% stenosed.  LV end diastolic pressure is mildly elevated.  There is severe left ventricular systolic dysfunction.  The left ventricular ejection fraction is less than 25% by visual estimate.  Hemodynamic findings consistent with mild pulmonary hypertension.  Was in the ED 07/11/20 due to shortness of breath after gaining 8 pounds over previous 48 hours. Had run out of his lasix and has difficulty in getting it. IV lasix given and he was released. Admitted 06/19/20 due to acute on chronic HF. Cardiology consult obtained. Initially given IV lasix with transition to oral diuretics. Elevated troponin thought to be due to demand ischemia. Discharged after 4 days.   He presents today for his initial visit with a chief complaint of   Review of Systems    Physical Exam    Assessment & Plan:  1: Chronic heart failure with reduced ejection fraction- - NYHA class - [facility] cardiology saw patient during recent admission; f/u needs to be scheduled - BNP 07/11/20 was 1217.1  2: HTN- - BP - sees PCP at the [facility] - BMP 07/12/20 reviewed and showed sodium 142, potassium 3.5, creatinine 1.47 and GFR 54  3: Atrial fibrillation-

## 2020-07-19 ENCOUNTER — Ambulatory Visit: Payer: No Typology Code available for payment source | Admitting: Family

## 2020-07-22 ENCOUNTER — Ambulatory Visit: Payer: No Typology Code available for payment source | Admitting: Family

## 2020-07-31 ENCOUNTER — Other Ambulatory Visit: Payer: Self-pay

## 2020-07-31 ENCOUNTER — Encounter: Payer: Self-pay | Admitting: Family

## 2020-07-31 ENCOUNTER — Ambulatory Visit: Payer: No Typology Code available for payment source | Attending: Family | Admitting: Family

## 2020-07-31 VITALS — BP 142/82 | HR 63 | Resp 18 | Ht 69.0 in | Wt 215.1 lb

## 2020-07-31 DIAGNOSIS — Z7982 Long term (current) use of aspirin: Secondary | ICD-10-CM | POA: Insufficient documentation

## 2020-07-31 DIAGNOSIS — I10 Essential (primary) hypertension: Secondary | ICD-10-CM

## 2020-07-31 DIAGNOSIS — I2510 Atherosclerotic heart disease of native coronary artery without angina pectoris: Secondary | ICD-10-CM | POA: Insufficient documentation

## 2020-07-31 DIAGNOSIS — G479 Sleep disorder, unspecified: Secondary | ICD-10-CM | POA: Insufficient documentation

## 2020-07-31 DIAGNOSIS — F17210 Nicotine dependence, cigarettes, uncomplicated: Secondary | ICD-10-CM | POA: Insufficient documentation

## 2020-07-31 DIAGNOSIS — Z630 Problems in relationship with spouse or partner: Secondary | ICD-10-CM | POA: Insufficient documentation

## 2020-07-31 DIAGNOSIS — R002 Palpitations: Secondary | ICD-10-CM | POA: Insufficient documentation

## 2020-07-31 DIAGNOSIS — I4819 Other persistent atrial fibrillation: Secondary | ICD-10-CM | POA: Insufficient documentation

## 2020-07-31 DIAGNOSIS — Z8249 Family history of ischemic heart disease and other diseases of the circulatory system: Secondary | ICD-10-CM | POA: Insufficient documentation

## 2020-07-31 DIAGNOSIS — M549 Dorsalgia, unspecified: Secondary | ICD-10-CM | POA: Insufficient documentation

## 2020-07-31 DIAGNOSIS — I130 Hypertensive heart and chronic kidney disease with heart failure and stage 1 through stage 4 chronic kidney disease, or unspecified chronic kidney disease: Secondary | ICD-10-CM | POA: Insufficient documentation

## 2020-07-31 DIAGNOSIS — N182 Chronic kidney disease, stage 2 (mild): Secondary | ICD-10-CM | POA: Insufficient documentation

## 2020-07-31 DIAGNOSIS — Z7901 Long term (current) use of anticoagulants: Secondary | ICD-10-CM | POA: Insufficient documentation

## 2020-07-31 DIAGNOSIS — R0602 Shortness of breath: Secondary | ICD-10-CM | POA: Insufficient documentation

## 2020-07-31 DIAGNOSIS — Z79899 Other long term (current) drug therapy: Secondary | ICD-10-CM | POA: Insufficient documentation

## 2020-07-31 DIAGNOSIS — I5022 Chronic systolic (congestive) heart failure: Secondary | ICD-10-CM | POA: Insufficient documentation

## 2020-07-31 NOTE — Patient Instructions (Signed)
Continue weighing daily and call for an overnight weight gain of > 2 pounds or a weekly weight gain of >5 pounds. 

## 2020-07-31 NOTE — Progress Notes (Signed)
Patient ID: Gregory Howell, male    DOB: Sep 10, 1960, 59 y.o.   MRN: [id number]  HPI  Mr Eber is a 59 y/o male with a history of CAD, HTN, CKD, persistent atrial fibrillation, current tobacco use and chronic heart failure.   Echo report from 04/05/20 reviewed and showed an EF of 40-45% along with moderate LVH, severe LAE, severe RAE and mildly elevated PA pressure.   RHC/LHC done 12/07/19 showed:  2nd Mrg lesion is 100% stenosed.  Dist Cx lesion is 60% stenosed.  1st Mrg lesion is 40% stenosed.  Ost RCA lesion is 50% stenosed.  RPAV lesion is 60% stenosed.  Mid RCA lesion is 30% stenosed.  LV end diastolic pressure is mildly elevated.  There is severe left ventricular systolic dysfunction.  The left ventricular ejection fraction is less than 25% by visual estimate.  Hemodynamic findings consistent with mild pulmonary hypertension.  Was in the ED 07/11/20 due to shortness of breath after gaining 8 pounds over previous 48 hours. Had run out of his lasix and has difficulty in getting it. IV lasix given and he was released. Admitted 06/19/20 due to acute on chronic HF. Cardiology consult obtained. Initially given IV lasix with transition to oral diuretics. Elevated troponin thought to be due to demand ischemia. Discharged after 4 days.   He presents today for his initial visit with a chief complaint of moderate fatigue with little exertion. He describes this as having been present for a few months. He has associated shortness of breath, palpitations, back pain, difficulty sleeping and high stress along with this. He denies any dizziness, abdominal distention, pedal edema, chest pain, cough or weight gain.   Says that he's under quite a bit of marital stress and is looking to find his own place to live. Recognizes that stress isn't good for his heart.   Past Medical History:  Diagnosis Date  . CHF (congestive heart failure) (HCC)   . CKD (chronic kidney disease), stage II   .  Coronary artery disease, non-occlusive   . HFrEF (heart failure with reduced ejection fraction) (HCC)   . Hypertension   . Hypertensive heart disease   . NICM (nonischemic cardiomyopathy) (HCC)   . Persistent atrial fibrillation [facility])    Past Surgical History:  Procedure Laterality Date  . RIGHT/LEFT HEART CATH AND CORONARY ANGIOGRAPHY N/A 12/07/2019   Procedure: RIGHT/LEFT HEART CATH AND CORONARY ANGIOGRAPHY;  Surgeon: Antonieta Iba, MD;  Location: [facility] INVASIVE CV LAB;  Service: Cardiovascular;  Laterality: N/A;   Family History  Problem Relation Age of Onset  . Hypertension Mother   . Hypertension Father   . CAD Brother   . Diabetes Brother    Social History   Tobacco Use  . Smoking status: Current Some Day Smoker    Packs/day: 0.50    Types: Cigarettes  . Smokeless tobacco: Never Used  Substance Use Topics  . Alcohol use: Yes    Alcohol/week: 7.0 - 14.0 standard drinks    Types: 7 - 14 Cans of beer per week    Comment: daily   No Known Allergies Prior to Admission medications   Medication Sig Start Date End Date Taking? Authorizing Provider  apixaban (ELIQUIS) 5 MG TABS tablet Take 1 tablet (5 mg total) by mouth 2 (two) times daily. 06/23/20 09/21/20 Yes Darlin Priestly, MD  aspirin EC 81 MG EC tablet Take 1 tablet (81 mg total) by mouth daily. 11/03/16  Yes Milagros Loll, MD  atorvastatin (LIPITOR) 80 MG  tablet Take 1 tablet (80 mg total) by mouth daily. 06/23/20 09/21/20 Yes Darlin Priestly, MD  carvedilol (COREG) 25 MG tablet Take 1 tablet (25 mg total) by mouth 2 (two) times daily with a meal. 06/23/20 09/21/20 Yes Darlin Priestly, MD  furosemide (LASIX) 40 MG tablet Take 1 tablet (40 mg total) by mouth daily. 07/12/20 09/10/20 Yes Arnetha Courser, MD  hydrALAZINE (APRESOLINE) 50 MG tablet Take 1 tablet (50 mg total) by mouth every 8 (eight) hours. 06/23/20 09/21/20 Yes Darlin Priestly, MD  isosorbide mononitrate (IMDUR) 30 MG 24 hr tablet Take 1 tablet (30 mg total) by mouth daily. 06/23/20  09/21/20 Yes Darlin Priestly, MD  losartan (COZAAR) 50 MG tablet Take 1 tablet (50 mg total) by mouth daily. 06/23/20 09/21/20 Yes Darlin Priestly, MD  methocarbamol (ROBAXIN) 500 MG tablet Take 1 tablet (500 mg total) by mouth every 8 (eight) hours as needed for muscle spasms (cramping). 04/07/20  Yes Esaw Grandchild A, DO  nicotine (NICODERM CQ - DOSED IN MG/24 HOURS) 14 mg/24hr patch Place 1 patch (14 mg total) onto the skin daily. Patient not taking: Reported on 07/31/2020 06/24/20 09/02/20  Darlin Priestly, MD    Review of Systems  Constitutional: Positive for fatigue (tire easily). Negative for appetite change.  HENT: Negative for congestion, postnasal drip and sore throat.   Eyes: Negative.   Respiratory: Positive for shortness of breath. Negative for cough and chest tightness.   Cardiovascular: Positive for palpitations. Negative for chest pain and leg swelling.  Gastrointestinal: Negative for abdominal distention and abdominal pain.  Endocrine: Negative.   Genitourinary: Negative.   Musculoskeletal: Positive for back pain. Negative for neck pain.  Skin: Negative.   Allergic/Immunologic: Negative.   Neurological: Negative for dizziness and light-headedness.  Hematological: Negative for adenopathy. Does not bruise/bleed easily.  Psychiatric/Behavioral: Positive for sleep disturbance (sleeping on no pillows). Negative for dysphoric mood. The patient is not nervous/anxious.    Vitals:   07/31/20 1331  BP: (!) 142/82  Pulse: 63  Resp: 18  SpO2: 95%  Weight: 215 lb 2 oz (97.6 kg)  Height: 5\' 9"  (1.753 m)   Wt Readings from Last 3 Encounters:  07/31/20 215 lb 2 oz (97.6 kg)  07/11/20 218 lb (98.9 kg)  06/23/20 215 lb 13.3 oz (97.9 kg)   Lab Results  Component Value Date   CREATININE 1.47 (H) 07/12/2020   CREATININE 1.35 (H) 07/11/2020   CREATININE 1.41 (H) 06/23/2020    Physical Exam Vitals and nursing note reviewed.  Constitutional:      Appearance: Normal appearance.  HENT:     Head:  Normocephalic and atraumatic.  Cardiovascular:     Rate and Rhythm: Normal rate. Rhythm irregular.  Pulmonary:     Effort: Pulmonary effort is normal. No respiratory distress.     Breath sounds: No wheezing or rales.  Abdominal:     General: There is no distension.     Palpations: Abdomen is soft.     Tenderness: There is no abdominal tenderness.  Musculoskeletal:        General: No tenderness.     Cervical back: Normal range of motion and neck supple.     Right lower leg: No edema.     Left lower leg: No edema.  Skin:    General: Skin is warm and dry.  Neurological:     General: No focal deficit present.     Mental Status: He is alert and oriented to person, place, and time.  Psychiatric:  Mood and Affect: Mood is anxious.    Assessment & Plan:  1: Chronic heart failure with reduced ejection fraction- - NYHA class III - euvolemic today - weighing daily and says that his weight has been stable; reminded to call for an overnight weight gain of > 2 pounds or a weekly weight gain of > 5 pounds - not adding salt and has been trying to read food labels for sodium content; written dietary information given to him about a 2000mg  sodium diet - consider changing his losartan to entresto in the future/ currently getting RX through the ; consider also adding farxiga and or spironolactone - [facility] cardiology saw patient during recent admission; f/u was scheduled for 08/15/20 - BNP 07/11/20 was 1217.1 - reports receiving his flu vaccine and 2 covid vaccines  2: HTN- - BP looks good today - sees PCP at the 14/2/21 and returns 08/13/20 - BMP 07/12/20 reviewed and showed sodium 142, potassium 3.5, creatinine 1.47 and GFR 54  3: Atrial fibrillation- - currently on apixaban BID - notices palpitations worsen when he's under stressful marital situation - smoking 2-3 cigarettes daily; complete cessation encouraged   Patient did not bring his medications nor a list. Each medication was verbally  reviewed with the patient and he was encouraged to bring the bottles to every visit to confirm accuracy of list.  Return in 2 months or sooner for any questions/problems before then.

## 2020-08-12 NOTE — Progress Notes (Deleted)
Cardiology Office Note    Date:  08/12/2020   ID:  Gregory Howell, DOB [birth year], MRN [id number]  [facility]P:  [facility]  Cardiologist:  Kathlyn Sacramento, MD  Electrophysiologist:  None   Chief Complaint: Hospital follow up  History of Present Illness:   Gregory Howell is a 60 y.o. male with history of CAD by [facility] in 11/2019, chronic combined systolic and diastolic CHF, NICM, pulmonary hypertension, persistent Afib on Eliquis, hypertensive heart disease, CKD stage II-III, prior medical nonadherence, and alcohol/tobacco use who presents for hospital follow up after recent admission to [facility] from 11/10 to 11/14 for acute on chronic combined systolic and diastolic CHF.   He was initially consulted on by our group during an admission in 2018 for atypical chest pain, ruled out and underwent a nuclear stress test that was low risk.  Optimization of his blood pressure was advised. He was admitted in 11/2019 with acute HFrEF and new onset Afib. Echo at that time showed an EF of 25-30%, global hypokinesis, indeterminate diastolic function parameters, severely reduced RVSF with normal RV cavity size, mild biatrial enlargement, right atrial pressure 15 mmHg, and PASP 64.8 mmHg. Diagnostic R/LHC showed nonobstructive CAD with mildly elevated right heart pressure and PCWP as outlined below. He was diuresed and medically optimized. With regards to his Afib, he was rate controlled with recommendation to follow up after he was adequately anticoagulated for consideration of DCCV. He was lost to follow up with our office. He was admitted in 03/2020 with acute on chronic HFrEF likely exacerbated by elevated blood pressure. Echo showed an EF of 40-45% with hypokinesis of the basal region, moderate LVH, indeterminate diastolic function parameters, mildly reduced RVSF with mildly enlarged RV cavity size, mildly elevated PASP estimated at 37.5 mmHg, severely dilated left atrium, and a moderately dilated right atrium.  Continued rate control strategy was advised given he was asymptomatic and dilated left atrium.  Following each of his hospital admissions, he has been lost to follow up with our office. He does report following up with the [facility], though does miss some appointments as he is homeless and does not have a car.  He was admitted to the hospital on 11/10 through 11/14 with volume overload with acute on chronic combined systolic and diastolic CHF in the context of medication nonadherence.  High-sensitivity troponin 44 with a delta of 34.  BNP 1063.  UDS negative.  Symptoms improved with IV diuresis.  He remained in A. fib with controlled ventricular response.  He was readmitted to the hospital on 12/2 through 12/3 with return of volume overload in the setting of acute on chronic combined systolic and diastolic CHF.  He indicated he did not pick up his medications following his admission in November 2021.  High-sensitivity troponin XVIII with a delta of 17.  BNP 1217.  He was the IV diuresed with symptom improvement.  He was seen by the [facility] CHF clinic on 07/31/2020 and felt to be euvolemic.  No changes were made.  ***   Labs independently reviewed: 07/2020 - potassium 3.5, BUN 22, serum creatinine 1.47, HGB 15.4, PLT 230 06/2020 - magnesium 2.4, TSH normal, albumin 3.4, AST/ALT normal 11/2019 - A1c 5.4%  Past Medical History:  Diagnosis Date  . CHF (congestive heart failure) ([place])   . CKD (chronic kidney disease), stage II   . Coronary artery disease, non-occlusive   . HFrEF (heart failure with reduced ejection fraction) ([place])   . Hypertension   .  Hypertensive heart disease   . NICM (nonischemic cardiomyopathy) (HCC)   . Persistent atrial fibrillation [facility])     Past Surgical History:  Procedure Laterality Date  . RIGHT/LEFT HEART CATH AND CORONARY ANGIOGRAPHY N/A 12/07/2019   Procedure: RIGHT/LEFT HEART CATH AND CORONARY ANGIOGRAPHY;  Surgeon: Antonieta Iba, MD;  Location: [facility]  INVASIVE CV LAB;  Service: Cardiovascular;  Laterality: N/A;    Current Medications: No outpatient medications have been marked as taking for the 08/15/20 encounter (Appointment) with Sondra Barges, PA-C.    Allergies:   Patient has no known allergies.   Social History   Socioeconomic History  . Marital status: Married    Spouse name: Not on file  . Number of children: Not on file  . Years of education: Not on file  . Highest education level: Not on file  Occupational History  . Not on file  Tobacco Use  . Smoking status: Current Some Day Smoker    Packs/day: 0.50    Types: Cigarettes  . Smokeless tobacco: Never Used  Substance and Sexual Activity  . Alcohol use: Yes    Alcohol/week: 7.0 - 14.0 standard drinks    Types: 7 - 14 Cans of beer per week    Comment: daily  . Drug use: Never  . Sexual activity: Not on file  Other Topics Concern  . Not on file  Social History Narrative  . Not on file   Social Determinants of Health   Financial Resource Strain: Not on file  Food Insecurity: Not on file  Transportation Needs: Not on file  Physical Activity: Not on file  Stress: Not on file  Social Connections: Not on file     Family History:  The patient's family history includes CAD in his brother; Diabetes in his brother; Hypertension in his father and mother.  ROS:   ROS   EKGs/Labs/Other Studies Reviewed:    Studies reviewed were summarized above. The additional studies were reviewed today:  Nuclear stress test 11/02/2016:  Defect 1: There is a small defect of mild severity present in the apex location. This is likely due to apical thinning artifact.  The study is normal.  This is a low risk study.  Nuclear stress EF: 50%. The left ventricle is mildly dilated. __________  2D echo 12/04/2019: 1. Left ventricular ejection fraction, by estimation, is 25 to 30%. The  left ventricle has severely decreased function. The left ventricle  demonstrates global  hypokinesis. There is moderate left ventricular  hypertrophy. Left ventricular diastolic  parameters are indeterminate.  2. Right ventricular systolic function is severely reduced. The right  ventricular size is normal. There is moderately elevated pulmonary artery  systolic pressure. The estimated right ventricular systolic pressure is  64.8 mmHg.  3. Left atrial size was mildly dilated.  4. Right atrial size was mildly dilated.  5. The mitral valve is normal in structure. Mild mitral valve  regurgitation.  6. The aortic valve was not well visualized. Aortic valve regurgitation  is not visualized. No aortic stenosis is present.  7. The inferior vena cava is dilated in size with <50% respiratory  variability, suggesting right atrial pressure of 15 mmHg. __________  [facility] 12/07/2019: Coronary dominance: Right  Left mainstem: Large vessel that bifurcates into the LAD and left circumflex, no significant disease noted  Left anterior descending (LAD): Large vessel that extends to the apical region, diagonal branch 2 of moderate size, no significant disease noted, mild diffuse disease LAD, diagonal vessels  Left  circumflex (LCx): Large vessel with OM branch 3, OM1 with mild 40% proximal disease, OM 2 is essentially occluded in the proximal region with collaterals/small, atretic, OM 3 has 60% mid vessel disease  Right coronary artery (RCA): Right dominant vessel with PL and PDA, 50% ostial disease, 30% proximal to mid vessel disease, 60% PL branch disease  Left ventriculography: Left ventricular systolic function is severely depressed, mildly dilated LV, LVEF is estimated at 25%, no significant aortic valve stenosis Mild to moderate MR  Right heart pressures RA 10 mmHg RV 45/5, 8 PA 47/27, mean 36 Wedge pressure mean 20 LV 137/13 Ao 129/91  Cardiac output 3.31 cardiac index 1.58   Final Conclusions:  Severely depressed LV function estimated 25%, Nonobstructive  coronary disease Coronary disease out of proportion to depressed ejection fraction -Mildly elevated right heart pressures and wedge   Recommendations:  Continue carvedilol, hydralazine, isosorbide Aspirin, statin Smoking cessation recommended Alcohol cessation recommended (recently moved from "beer to brown liquor") --Start low-dose losartan tomorrow Transition to [facility] as an outpatient -Blood pressure continues to run high, will increase isosorbide up to 30 daily __________  2D echo 04/05/2020: 1. Left ventricular ejection fraction, by estimation, is 40 to 45%. The  left ventricle has mildly decreased function. Hypokinesis of basal  regions.There is moderate left ventricular hypertrophy. Left ventricular  diastolic parameters are indeterminate.  2. Right ventricular systolic function is mildly reduced. The right  ventricular size is mildly enlarged. There is mildly elevated pulmonary  artery systolic pressure.  3. Left atrial size was severely dilated.  4. Right atrial size was moderately dilated.   EKG:  EKG is ordered today.  The EKG ordered today demonstrates ***  Recent Labs: 06/20/2020: ALT 23; TSH 1.221 06/23/2020: Magnesium 2.4 07/11/2020: B Natriuretic Peptide 1,217.1; Hemoglobin 15.4; Platelets 230 07/12/2020: BUN 22; Creatinine, Ser 1.47; Potassium 3.5; Sodium 142  Recent Lipid Panel No results found for: CHOL, TRIG, HDL, CHOLHDL, VLDL, LDLCALC, LDLDIRECT  PHYSICAL EXAM:    VS:  There were no vitals taken for this visit.  BMI: There is no height or weight on file to calculate BMI.  Physical Exam  Wt Readings from Last 3 Encounters:  07/31/20 215 lb 2 oz (97.6 kg)  07/11/20 218 lb (98.9 kg)  06/23/20 215 lb 13.3 oz (97.9 kg)     ASSESSMENT & PLAN:   1. Chronic combined systolic and diastolic CHF/NICM/RV dysfunction/pulmonary hypertension: ***  2. CAD involving the native coronary arteries without***angina:  3. Persistent A. Fib:  4. Hypertensive  heart disease: Blood pressure ***.  5. CKD stage II-III:  6. Tobacco use:  Disposition: F/u with Dr. Kirke Corin or an APP in ***.   Medication Adjustments/Labs and Tests Ordered: Current medicines are reviewed at length with the patient today.  Concerns regarding medicines are outlined above. Medication changes, Labs and Tests ordered today are summarized above and listed in the Patient Instructions accessible in Encounters.   Signed, Eula Listen, PA-C 08/12/2020 12:35 PM     [facility] -  [address] [place], [place] [address] [phone]

## 2020-08-15 ENCOUNTER — Ambulatory Visit: Payer: Self-pay | Admitting: Physician Assistant

## 2020-08-16 ENCOUNTER — Encounter: Payer: Self-pay | Admitting: Physician Assistant

## 2020-08-23 ENCOUNTER — Encounter: Payer: Self-pay | Admitting: Physician Assistant

## 2020-08-23 ENCOUNTER — Other Ambulatory Visit
Admission: RE | Admit: 2020-08-23 | Discharge: 2020-08-23 | Disposition: A | Discharge status: Home / Self Care | Payer: No Typology Code available for payment source | Attending: Physician Assistant | Admitting: Physician Assistant

## 2020-08-23 ENCOUNTER — Other Ambulatory Visit: Payer: Self-pay

## 2020-08-23 ENCOUNTER — Ambulatory Visit (INDEPENDENT_AMBULATORY_CARE_PROVIDER_SITE_OTHER): Payer: Self-pay | Admitting: Physician Assistant

## 2020-08-23 VITALS — BP 162/108 | HR 81 | Ht 69.0 in | Wt 221.0 lb

## 2020-08-23 DIAGNOSIS — I998 Other disorder of circulatory system: Secondary | ICD-10-CM

## 2020-08-23 DIAGNOSIS — N183 Chronic kidney disease, stage 3 unspecified: Secondary | ICD-10-CM

## 2020-08-23 DIAGNOSIS — Z87898 Personal history of other specified conditions: Secondary | ICD-10-CM

## 2020-08-23 DIAGNOSIS — I5021 Acute systolic (congestive) heart failure: Secondary | ICD-10-CM

## 2020-08-23 DIAGNOSIS — I272 Pulmonary hypertension, unspecified: Secondary | ICD-10-CM

## 2020-08-23 DIAGNOSIS — Z7901 Long term (current) use of anticoagulants: Secondary | ICD-10-CM

## 2020-08-23 DIAGNOSIS — I10 Essential (primary) hypertension: Secondary | ICD-10-CM

## 2020-08-23 DIAGNOSIS — Z720 Tobacco use: Secondary | ICD-10-CM

## 2020-08-23 DIAGNOSIS — I1310 Hypertensive heart and chronic kidney disease without heart failure, with stage 1 through stage 4 chronic kidney disease, or unspecified chronic kidney disease: Secondary | ICD-10-CM

## 2020-08-23 DIAGNOSIS — Z9114 Patient's other noncompliance with medication regimen: Secondary | ICD-10-CM

## 2020-08-23 DIAGNOSIS — I428 Other cardiomyopathies: Secondary | ICD-10-CM

## 2020-08-23 DIAGNOSIS — I4819 Other persistent atrial fibrillation: Secondary | ICD-10-CM

## 2020-08-23 LAB — BASIC METABOLIC PANEL
Anion gap: 11 (ref 5–15)
BUN: 16 mg/dL (ref 8–23)
CO2: 31 mmol/L (ref 22–32)
Calcium: 9.3 mg/dL (ref 8.9–10.3)
Chloride: 100 mmol/L (ref 98–111)
Creatinine, Ser: 1.59 mg/dL — ABNORMAL HIGH (ref 0.61–1.24)
GFR, Estimated: 49 mL/min — ABNORMAL LOW (ref >60)
Glucose, Bld: 105 mg/dL — ABNORMAL HIGH (ref 70–99)
Potassium: 4.5 mmol/L (ref 3.5–5.1)
Sodium: 142 mmol/L (ref 135–145)

## 2020-08-23 MED ORDER — POTASSIUM CHLORIDE CRYS ER 20 MEQ PO TBCR: 40.0000 meq | EXTENDED_RELEASE_TABLET | Freq: Two times a day (BID) | ORAL | 0 refills | Status: DC

## 2020-08-23 MED ORDER — CARVEDILOL 25 MG PO TABS: 37.5000 mg | ORAL_TABLET | Freq: Two times a day (BID) | ORAL | 0 refills | Status: DC

## 2020-08-23 NOTE — Patient Instructions (Signed)
Medication Instructions:   1) INCREASE Carvedilol to 37.5mg  daily - A paper Rx was given to you today   2) INCREASE Lasix 80mg  TWICE a day for THREE days (take Lasix 40mg  2 tablets TWICE daily x 3 days)     THEN, resume regular dose Lasix 40mg  Twice daily   3) START Potassium 40 mEq TWICE daily ONLY for 3 days while on INCREASED Lasix dose     We have sent in a 30 day supply to keep on hand after you only take for 3 days  We will check labs today to let know how to proceed with further medication instructions.  *If you need a refill on your cardiac medications before your next appointment, please call your pharmacy*   Lab Work: -  Your physician recommends that you have lab work TODAY at the medical mall: [facility] -  Please go to the . You will check in at the front desk to the right as you walk into the atrium. Valet Parking is offered if needed. - No appointment needed. You may go any day between 7 am and 6 pm.   If you have labs (blood work) drawn today and your tests are completely normal, you will receive your results only by: MyChart Message (if you have MyChart) OR . A paper copy in the mail If you have any lab test that is abnormal or we need to change your treatment, we will call you to review the results.   Testing/Procedures: None ordered   Follow-Up: At [facility], you and your health needs are our priority.  As part of our continuing mission to provide you with exceptional heart care, we have created designated Provider Care Teams.  These Care Teams include your primary Cardiologist (physician) and Advanced Practice Providers (APPs -  Physician Assistants and Nurse Practitioners) who all work together to provide you with the care you need, when you need it.  We recommend signing up for the patient portal called "MyChart".  Sign up information is provided on this After Visit Summary.  MyChart is used to connect with patients for Virtual Visits  (Telemedicine).  Patients are able to view lab/test results, encounter notes, upcoming appointments, etc.  Non-urgent messages can be sent to your provider as well.   To learn more about what you can do with MyChart, go to [employer].    Your next appointment:   1 - 2 week(s)  The format for your next appointment:   In Person  Provider:   You may see Marland Kitchen, MD or one of the following Advanced Practice Providers on your designated Care Team:    [facility], NP  [email or web address], PA-C  Lorine Bears, PA-C  Cadence Newsoms, Eula Listen  Marisue Ivan, NP

## 2020-08-23 NOTE — Progress Notes (Signed)
Office Visit    Patient Name: Gregory Howell Date of Encounter: 08/23/2020  Primary Care Provider:  [facility] Primary Cardiologist:  Lorine Bears, MD  Chief Complaint    Chief Complaint  Patient presents with  . Follow-up    Follow up and medications verbally reviewed with patient.     60 year old male with history of nonobstructive CAD by [facility] 11/2019, HFrEF secondary to nonischemic cardiomyopathy, pulmonary hypertension, persistent atrial fibrillation on Eliquis, hypertensive heart disease, CKD stage II-III, prior medical noncompliance, alcohol/tobacco use, and who is being seen today for follow-up with significant volume overload and elevated BP.  Past Medical History    Past Medical History:  Diagnosis Date  . CHF (congestive heart failure) (HCC)   . CKD (chronic kidney disease), stage II   . Coronary artery disease, non-occlusive   . HFrEF (heart failure with reduced ejection fraction) (HCC)   . Hypertension   . Hypertensive heart disease   . NICM (nonischemic cardiomyopathy) (HCC)   . Persistent atrial fibrillation [facility])    Past Surgical History:  Procedure Laterality Date  . RIGHT/LEFT HEART CATH AND CORONARY ANGIOGRAPHY N/A 12/07/2019   Procedure: RIGHT/LEFT HEART CATH AND CORONARY ANGIOGRAPHY;  Surgeon: Antonieta Iba, MD;  Location: [facility] INVASIVE CV LAB;  Service: Cardiovascular;  Laterality: N/A;    Allergies  No Known Allergies  History of Present Illness    Gregory Howell is a 60 y.o. male with PMH as above.   He was first evaluated by our group during admission in 2018 for atypical chest pain.  He ruled out.  He underwent nuclear stress test that was low risk.  Optimization of BP was recommended.  Admitted 11/2019 with acute HFrEF and new onset A. fib.  Echo showed EF 25 to 30%, right atrial pressure 15 mmHg, PASP 64.8 mmHg.    Diagnostic R/LHC showed nonobstructive CAD with mildly elevated right heart pressures and PCWP as outlined  below.  He was IV diuresed and medically optimized.    A. fib was noted to be rate controlled with recommendation to follow-up after he was adequately anticoagulated for consideration of cardioversion.    He was lost to follow-up.  He was admitted 03/2020 with acute on chronic HFrEF likely exacerbated by elevated BP.  Echo showed EF 40 to 45% with hypokinesis of the basal region, moderate LVH, mildly reduced RVSF with mildly enlarged RV cavity size, mildly elevated PASP 37.5 mmHg, severely dilated left atrium, moderately dilated right atrium.  It has been noted that, following each of his admissions, he has been lost to follow-up with the office.  [facility]  [facility] by [facility] 07/11/2020 for acute on chronic systolic heart failure.  He reported shortness of breath and chest tightness with orthopnea over the last few days.  He also reported an 8 pound weight gain.  It was noted he had recently been admitted for IV diuresis and discharged home with oral Lasix but ran out.  He was unable to refill his Las[place] in [place] due to social barriers including homelessness and lack of transportation.  Vitals significant for BP 175/116.  EKG showed atrial fibrillation with ventricular rate 115 bpm.  He was successfully IV diuresed and discharged next day.  He was seen by Clarisa Kindred, NP 07/31/2020. No change in medications.     Today, 08/24/2020, he returns to clinic and is significantly volume overloaded on exam.  He denies any chest pain, racing heart rate, presyncope, syncope, or recent falls.  He states he is asymptomatic with his atrial fibrillation, as he does not feel his heart race or skip beats.  BP is noted to be significantly elevated at 162/108 with clonidine 0.1 mg administered at the start of today's visit.  He reports recent stressors and wonders if this is contributing to his BP.  For example, he states the bus driver left him today, which made  him concerned that he was not able to make his appointment. He also admits to increasing volume overload.  He states he started monitoring his blood pressure at home but reports that BP cuff will not stay inflated; therefore, he does not have any BP logs to display during today's visit and reports that he will reach out to the [place] for a new cuff that functions appropriately.  He initially states he is taking Lasix as prescribed and initially reports daily Lasix 40 mg daily.  However, at checkout, he reports taking Lasix 40 mg twice daily.  Despite taking Lasix twice daily, he still feels as if he is holding onto fluid.  No SOB at rest. He reports dyspnea that occurs with minimal exertion, such as walking on flat ground.  He states that he has learned how far he is able to walk before he becomes short of breath and needs to stop.  He has learned to pace himself and go very slowly, so that he is able to travel short distances without becoming significantly short of breath.  He states his significant other is working on getting him to completely stop smoking and is down to 2 cigarettes daily.  He reports very rare alcohol consumption with last drink approximately 2 weeks ago.   He has noticed that he has gained more weight over the last week or 2.  He is weighing himself each morning, estimating 10 pounds over the last 3 weeks and weight gain.  He does not add any additional salt to his food but does note eating foods with higher salt content, such as soup.  Fluid and salt intake reviewed at length.  He reports that he follows with the [place] and in [place].He reports he is currently homeless but able to afford his medications.   ReDS 50% SpO2 96% BP 162/108   In clinic medications provided    Home Medications    Current Outpatient Medications on File Prior to Visit  Medication Sig Dispense Refill  . apixaban (ELIQUIS) 5 MG TABS tablet Take 1 tablet (5 mg total) by mouth 2 (two) times daily. 180 tablet 0  .  aspirin EC 81 MG EC tablet Take 1 tablet (81 mg total) by mouth daily.    Marland Kitchen atorvastatin (LIPITOR) 80 MG tablet Take 1 tablet (80 mg total) by mouth daily. 90 tablet 0  . carvedilol (COREG) 25 MG tablet Take 1 tablet (25 mg total) by mouth 2 (two) times daily with a meal. 180 tablet 0  . furosemide (LASIX) 40 MG tablet Take 1 tablet (40 mg total) by mouth daily. 30 tablet 1  . hydrALAZINE (APRESOLINE) 50 MG tablet Take 1 tablet (50 mg total) by mouth every 8 (eight) hours. 270 tablet 0  . isosorbide mononitrate (IMDUR) 30 MG 24 hr tablet Take 1 tablet (30 mg total) by mouth daily. 90 tablet 0  . losartan (COZAAR) 50 MG tablet Take 1 tablet (50 mg total) by mouth daily. 90 tablet 0  . methocarbamol (ROBAXIN) 500  MG tablet Take 1 tablet (500 mg total) by mouth every 8 (eight) hours as needed for muscle spasms (cramping). 30 tablet 0  . nicotine (NICODERM CQ - DOSED IN MG/24 HOURS) 14 mg/24hr patch Place 1 patch (14 mg total) onto the skin daily. 70 patch 0   No current facility-administered medications on file prior to visit.    Review of Systems    He denies chest pain, palpitations, pnd, orthopnea, n, v, dizziness, syncope, edema,  or early satiety.  He is uncertain if significant lower extremity edema.  He reports dyspnea with minimal exertion and weight gain.   All other systems reviewed and are otherwise negative except as noted above.  Physical Exam    VS:  BP (!) 162/108 (BP Location: Left Arm, Patient Position: Sitting, Cuff Size: Normal)   Pulse 81   Ht 5\' 9"  (1.753 m)   Wt 221 lb (100.2 kg)   SpO2 96%   BMI 32.64 kg/m  , BMI Body mass index is 32.64 kg/m. GEN: Well nourished, well developed, in no acute distress. HEENT: normal. Neck: Supple, no carotid bruits, or masses.  JVD difficult to assess due to body habitus Cardiac: IRIR with controlled ventricular rate, no murmurs, rubs, or gallops. No clubbing, cyanosis, moderate nonpitting bilateral edema.  Radials/DP/PT 2+ and equal  bilaterally.  Respiratory:  Respirations regular and unlabored, bibasilar crackles. GI: Soft, nontender, nondistended, BS + x 4. MS: no deformity or atrophy. Skin: warm and dry, no rash. Neuro:  Strength and sensation are intact. Psych: Normal affect.  Accessory Clinical Findings    ECG personally reviewed by me today - atrial flutter with variable AV block, 81bpm, PVCs, QTc , LVH, poor R wave progression in anterior leads - no acute changes.  VITALS Reviewed today   Temp Readings from Last 3 Encounters:  07/12/20 97.8 F (36.6 C)  06/23/20 98.8 F (37.1 C) (Oral)  04/07/20 (!) 97.5 F (36.4 C) (Oral)   BP Readings from Last 3 Encounters:  08/23/20 (!) 162/108  07/31/20 (!) 142/82  07/12/20 (!) 125/99   Pulse Readings from Last 3 Encounters:  08/23/20 81  07/31/20 63  07/12/20 78    Wt Readings from Last 3 Encounters:  08/23/20 221 lb (100.2 kg)  07/31/20 215 lb 2 oz (97.6 kg)  07/11/20 218 lb (98.9 kg)     LABS  reviewed today   Lab Results  Component Value Date   WBC 12.2 (H) 07/11/2020   HGB 15.4 07/11/2020   HCT 43.0 07/11/2020   MCV 82.2 07/11/2020   PLT 230 07/11/2020   Lab Results  Component Value Date   CREATININE 1.47 (H) 07/12/2020   BUN 22 07/12/2020   NA 142 07/12/2020   K 3.5 07/12/2020   CL 101 07/12/2020   CO2 27 07/12/2020   Lab Results  Component Value Date   ALT 23 06/20/2020   AST 23 06/20/2020   ALKPHOS 130 (H) 06/20/2020   BILITOT 2.3 (H) 06/20/2020   No results found for: CHOL, HDL, LDLCALC, LDLDIRECT, TRIG, CHOLHDL  Lab Results  Component Value Date   HGBA1C 5.4 12/05/2019   Lab Results  Component Value Date   TSH 1.221 06/20/2020     STUDIES/PROCEDURES reviewed today   Nuclear stress test 11/02/2016:  Defect 1: There is a small defect of mild severity present in the apex location. This is likely due to apical thinning artifact.  The study is normal.  This is a low risk study.  Nuclear stress  EF: 50%. The  left ventricle is mildly dilated. __________  2D echo 12/04/2019: 1. Left ventricular ejection fraction, by estimation, is 25 to 30%. The  left ventricle has severely decreased function. The left ventricle  demonstrates global hypokinesis. There is moderate left ventricular  hypertrophy. Left ventricular diastolic  parameters are indeterminate.  2. Right ventricular systolic function is severely reduced. The right  ventricular size is normal. There is moderately elevated pulmonary artery  systolic pressure. The estimated right ventricular systolic pressure is  64.8 mmHg.  3. Left atrial size was mildly dilated.  4. Right atrial size was mildly dilated.  5. The mitral valve is normal in structure. Mild mitral valve  regurgitation.  6. The aortic valve was not well visualized. Aortic valve regurgitation  is not visualized. No aortic stenosis is present.  7. The inferior vena cava is dilated in size with <50% respiratory  variability, suggesting right atrial pressure of 15 mmHg. __________  [facility]: Coronary dominance: Right  Left mainstem: Large vessel that bifurcates into the LAD and left circumflex, no significant disease noted  Left anterior descending (LAD): Large vessel that extends to the apical region, diagonal branch 2 of moderate size, no significant disease noted, mild diffuse disease LAD, diagonal vessels  Left circumflex (LCx): Large vessel with OM branch 3, OM1 with mild 40% proximal disease, OM 2 is essentially occluded in the proximal region with collaterals/small, atretic, OM 3 has 60% mid vessel disease  Right coronary artery (RCA): Right dominant vessel with PL and PDA, 50% ostial disease, 30% proximal to mid vessel disease, 60% PL branch disease  Left ventriculography: Left ventricular systolic function is severely depressed, mildly dilated LV, LVEF is estimated at 25%, no significant aortic valve stenosis Mild to moderate MR  Right heart  pressures RA 10 mmHg RV 45/5, 8 PA 47/27, mean 36 Wedge pressure mean 20 LV 137/13 Ao 129/91  Cardiac output 3.31 cardiac index 1.58   Final Conclusions:  Severely depressed LV function estimated 25%, Nonobstructive coronary disease Coronary disease out of proportion to depressed ejection fraction -Mildly elevated right heart pressures and wedge   Recommendations:  Continue carvedilol, hydralazine, isosorbide Aspirin, statin Smoking cessation recommended Alcohol cessation recommended (recently moved from "beer to brown liquor") --Start low-dose losartan tomorrow [facility] -Blood pressure continues to run high, will increase isosorbide up to 30 daily __________  2D echo 04/05/2020: 1. Left ventricular ejection fraction, by estimation, is 40 to 45%. The  left ventricle has mildly decreased function. Hypokinesis of basal  regions.There is moderate left ventricular hypertrophy. Left ventricular  diastolic parameters are indeterminate.  2. Right ventricular systolic function is mildly reduced. The right  ventricular size is mildly enlarged. There is mildly elevated pulmonary  artery systolic pressure.  3. Left atrial size was severely dilated.  4. Right atrial size was moderately dilated.  Assessment & Plan    Acute on chronic systolic heart failure secondary to nonischemic cardiomyopathy Right ventricular dysfunction Pulmonary hypertension -- Reports dyspnea with minimal activity.  Significantly volume up on exam.  Reds vest 50%.  He reports increasing his Lasix to Lasix 40 mg twice daily with ongoing volume overload.  He continues to note urine output.  As noted in the past, his cardiomyopathy has been out of proportion to his underlying CAD.  Echo 03/2020 with improving LV SF.  Seen by HF clinic without change in meds. Same day / short stay IV diuresis contacted and unable to accommodate pt due to  reported pt load. Recommendation today was  thus for increased diuresis with Lasix 80 mg twice daily.  Will obtain BMET to reassess renal function and electrolytes.  Considered transition to torsemide; however, he refills his medications through the [place], and this will delay his diuresis.  In addition to increased diuresis, increased his carvedilol to 37.5 mg twice daily with patient reporting that this will be refilled through the [place] once able.  At RTC, recommend reassessment of volume status and escalation of GDMT as renal function, potassium, and BP allow.  Long discussion regarding CHF education and the importance of daily weights and BP checks.  Long discussion regarding fluid and salt restrictions.  Recommended total fluids under 2 L daily and total salt under 2 g daily.  Nonobstructive CAD  -- No chest pain.  Reports dyspnea with volume overload noted on exam.  Previous LHC 11/2019 with nonobstructive CAD.  Continue Eliquis in place of ASA.  Continue Coreg, losartan, Imdur, and Lipitor.  Recommend risk factor modification, including smoking cessation.  Discussed diet and activity levels today in great detail.  Persistent atrial fibrillation -- Ventricular rates well controlled today.  Prior echoes with severe left atrium dilation.  Given his severe left atrium dilation, do not recommend DCCV at this time, as he will be unlikely to hold NSR.  He is relatively asymptomatic in atrial fibrillation with recommendation for rate control with carvedilol.  Given CHA2DS2-VASc score of at least 3 (CHF, hypertension, vascular disease), recommend indefinite anticoagulation with Eliquis 5 mg twice daily.  He reports compliance with OAC and denies any signs or symptoms of bleeding.  We will check BMET to reassess renal function electrolytes today.  Hypertensive heart disease -- BP suboptimal today with clonidine 0.1 mg provided in clinic and some improvement in BP.  Suspect further improvement in blood pressure with diuresis.  Continue carvedilol, losartan,  Imdur, hydralazine, and increased dose of Lasix.  Recommend low-sodium diet and fluid restriction.  Long discussion regarding control of BP.  [facility] with [facility].  Daily weights and BP measurements encouraged.  CKD -- Obtain BMET today.  Closely monitor with diuresis.  Tobacco use -- Encouraged complete cessation.  Homelessness -- He reports that, at this time, he is able to afford all of his medications.  He reports compliance.  Medication changes: Increase to lasix 80mg  BID with potassium supplementation then drop down to previous dose - updates if needed based on labs. Increase to Coreg 37.5mg  BID.  Labs ordered: BMET Studies / Imaging ordered: None Future considerations: Escalation of GDMT Disposition: RTC 1 week    , PA-C 08/23/2020

## 2020-08-27 ENCOUNTER — Telehealth: Payer: Self-pay | Admitting: *Deleted

## 2020-08-27 NOTE — Telephone Encounter (Signed)
Attempted to call pt. No answer. No vm set up. Will try to reach pt again later today.

## 2020-08-27 NOTE — Telephone Encounter (Signed)
-----   Message from Lennon Alstrom, PA-C sent at 08/26/2020  2:12 PM EST ----- Labs showed renal function with slight bump.  Suspect this is due to his volume overload and elevated BP, given significant volume overload at clinic.   Recommendations: --Repeat BMET and BNP this week.  --If still volume up or SOB now or at the end of this week, call the office.  --Log weight and BP. If he has not yet received  a new BP cuff from the [place] and agreeable to it, we could reach out to SW regarding getting him a cuff as well.  --Can he come into clinic this week or be seen via virtual or telephone visit? Looks like his next visit is not for a month.   --- Please let me know if he is unable to be seen this week- I may reach out to him later this week if needed.

## 2020-09-03 NOTE — Telephone Encounter (Signed)
Called and left message with family member to have pt call our office back. No other number to reach him per family and per chart.

## 2020-09-04 NOTE — Telephone Encounter (Signed)
Attempted to contact pt again. Left msg with family member they notified pt to call our office.

## 2020-09-05 NOTE — Telephone Encounter (Signed)
Unable to reach pt after multiple attempts. Pt has follow up scheduled with Leafy Kindle, PA in office on 09/11/20.

## 2020-09-11 ENCOUNTER — Ambulatory Visit: Payer: Self-pay | Admitting: Physician Assistant

## 2020-09-11 NOTE — Progress Notes (Deleted)
Office Visit    Patient Name: Gregory Howell Date of Encounter: 09/11/2020  Primary Care Provider:  [facility] Primary Cardiologist:  Lorine Bears, MD  Chief Complaint    No chief complaint on file.   60 year old male with history of nonobstructive CAD by [facility] 11/2019, HFrEF secondary to nonischemic cardiomyopathy, pulmonary hypertension, persistent atrial fibrillation on Eliquis, hypertensive heart disease, CKD stage II-III, prior medical noncompliance, alcohol/tobacco use, and who is being seen today for follow-up with significant volume overload and elevated BP.  Past Medical History    Past Medical History:  Diagnosis Date  . CHF (congestive heart failure) (HCC)   . CKD (chronic kidney disease), stage II   . Coronary artery disease, non-occlusive   . HFrEF (heart failure with reduced ejection fraction) (HCC)   . Hypertension   . Hypertensive heart disease   . NICM (nonischemic cardiomyopathy) (HCC)   . Persistent atrial fibrillation [facility])    Past Surgical History:  Procedure Laterality Date  . RIGHT/LEFT HEART CATH AND CORONARY ANGIOGRAPHY N/A 12/07/2019   Procedure: RIGHT/LEFT HEART CATH AND CORONARY ANGIOGRAPHY;  Surgeon: Antonieta Iba, MD;  Location: [facility] INVASIVE CV LAB;  Service: Cardiovascular;  Laterality: N/A;    Allergies  No Known Allergies  History of Present Illness    Gregory Howell is a 60 y.o. male with PMH as above.   He was first evaluated by our group during admission in 2018 for atypical chest pain.  He ruled out.  He underwent nuclear stress test that was low risk.  Optimization of BP was recommended.  Admitted 11/2019 with acute HFrEF and new onset A. fib.  Echo showed EF 25 to 30%, right atrial pressure 15 mmHg, PASP 64.8 mmHg.    Diagnostic R/LHC showed nonobstructive CAD with mildly elevated right heart pressures and PCWP as outlined below.  He was IV diuresed and medically optimized.    A. fib was noted to be rate  controlled with recommendation to follow-up after he was adequately anticoagulated for consideration of cardioversion.    He was lost to follow-up.  He was admitted 03/2020 with acute on chronic HFrEF likely exacerbated by elevated BP.  Echo showed EF 40 to 45% with hypokinesis of the basal region, moderate LVH, mildly reduced RVSF with mildly enlarged RV cavity size, mildly elevated PASP 37.5 mmHg, severely dilated left atrium, moderately dilated right atrium.  It has been noted that, following each of his admissions, he has been lost to follow-up with the office.  [facility]  He was last seen by [facility] 07/11/2020 for acute on chronic systolic heart failure.  He reported shortness of breath and chest tightness with orthopnea over the last few days.  He also reported an 8 pound weight gain.  It was noted he had recently been admitted for IV diuresis and discharged home with oral Lasix but ran out.  He was unable to refill his Las[place] in [place] due to social barriers including homelessness and lack of transportation.  Vitals significant for BP 175/116.  EKG showed atrial fibrillation with ventricular rate 115 bpm.  He was successfully IV diuresed and discharged next day.  He was seen by Clarisa Kindred, NP 07/31/2020. No change in medications.    ------  Seen 08/24/2020 and significantly volume overloaded on exam.  Asymptomatic with his atrial fibrillation. BP 162/108 with clonidine 0.1 mg administered in clinic  He reported recent stressors and  wondered if this was contributing to his BP.  He reported BP cuff as broken with intention to reach out to [facility] for a new cuff.  He was taking increased lasix 40 mg twice daily and still volume up with dyspnea with minimal exertion, such as walking on flat ground. He was walking very slowly, so that he was able to travel short distances. He was down to 2 cigarettes daily.  He reported rare alcohol consumption with last  drink approximately 2 weeks prior.   He was weighing himself q morning, estimating 10 pounds over the previous 3 weeks. He was eating soup and preprocessed foods.  He was homeless but able to afford his medications.   ReDS 50% SpO2 96% BP 162/108    Get repeat REDS ----  ----- Message from Lennon AlstromJacquelyn D Dearius Hoffmann, PA-C sent at 08/26/2020  2:12 PM EST ----- Labs showed renal function with slight bump.  Suspect this is due to his volume overload and elevated BP, given significant volume overload at clinic.   Recommendations: --Repeat BMET and BNP this week.  --If still volume up or SOB now or at the end of this week, call the office.  --Log weight and BP. If he has not yet received  a new BP cuff from [facility] and agreeable to it, we could reach out to [facility] regarding getting him a cuff as well.  --Can he come into clinic this week or be seen via virtual or telephone visit? Looks like his next visit is not for a month.   --- Please let me know if he is unable to be seen this week- I may reach out to him later this week if needed.    Home Medications    Current Outpatient Medications on File Prior to Visit  Medication Sig Dispense Refill  . apixaban (ELIQUIS) 5 MG TABS tablet Take 1 tablet (5 mg total) by mouth 2 (two) times daily. 180 tablet 0  . aspirin EC 81 MG EC tablet Take 1 tablet (81 mg total) by mouth daily.    Marland Kitchen. atorvastatin (LIPITOR) 80 MG tablet Take 1 tablet (80 mg total) by mouth daily. 90 tablet 0  . carvedilol (COREG) 25 MG tablet Take 1.5 tablets (37.5 mg total) by mouth 2 (two) times daily with a meal. 270 tablet 0  . furosemide (LASIX) 40 MG tablet Take 1 tablet (40 mg total) by mouth daily. (Patient taking differently: Take 40 mg by mouth 2 (two) times daily.) 30 tablet 1  . hydrALAZINE (APRESOLINE) 50 MG tablet Take 1 tablet (50 mg total) by mouth every 8 (eight) hours. 270 tablet 0  . isosorbide mononitrate (IMDUR) 30 MG 24 hr tablet Take 1 tablet (30 mg total) by mouth  daily. 90 tablet 0  . losartan (COZAAR) 50 MG tablet Take 1 tablet (50 mg total) by mouth daily. 90 tablet 0  . methocarbamol (ROBAXIN) 500 MG tablet Take 1 tablet (500 mg total) by mouth every 8 (eight) hours as needed for muscle spasms (cramping). 30 tablet 0  . potassium chloride SA (KLOR-CON M20) 20 MEQ tablet Take 2 tablets (40 mEq total) by mouth 2 (two) times daily. 120 tablet 0   No current facility-administered medications on file prior to visit.    Review of Systems    He denies chest pain, palpitations, pnd, orthopnea, n, v, dizziness, syncope, edema,  or early satiety.  He is uncertain if significant lower extremity edema.  He reports dyspnea with minimal exertion and  weight gain.   All other systems reviewed and are otherwise negative except as noted above.  Physical Exam    VS:  There were no vitals taken for this visit. , BMI There is no height or weight on file to calculate BMI. GEN: Well nourished, well developed, in no acute distress. HEENT: normal. Neck: Supple, no carotid bruits, or masses.  JVD difficult to assess due to body habitus Cardiac: IRIR with controlled ventricular rate, no murmurs, rubs, or gallops. No clubbing, cyanosis, moderate nonpitting bilateral edema.  Radials/DP/PT 2+ and equal bilaterally.  Respiratory:  Respirations regular and unlabored, bibasilar crackles. GI: Soft, nontender, nondistended, BS + x 4. MS: no deformity or atrophy. Skin: warm and dry, no rash. Neuro:  Strength and sensation are intact. Psych: Normal affect.  Accessory Clinical Findings    ECG personally reviewed by me today - atrial flutter with variable AV block, 81bpm, PVCs, QTc , LVH, poor R wave progression in anterior leads - no acute changes.  VITALS Reviewed today   Temp Readings from Last 3 Encounters:  07/12/20 97.8 F (36.6 C)  06/23/20 98.8 F (37.1 C) (Oral)  04/07/20 (!) 97.5 F (36.4 C) (Oral)   BP Readings from Last 3 Encounters:  08/23/20 (!)  162/108  07/31/20 (!) 142/82  07/12/20 (!) 125/99   Pulse Readings from Last 3 Encounters:  08/23/20 81  07/31/20 63  07/12/20 78    Wt Readings from Last 3 Encounters:  08/23/20 221 lb (100.2 kg)  07/31/20 215 lb 2 oz (97.6 kg)  07/11/20 218 lb (98.9 kg)     LABS  reviewed today   Lab Results  Component Value Date   WBC 12.2 (H) 07/11/2020   HGB 15.4 07/11/2020   HCT 43.0 07/11/2020   MCV 82.2 07/11/2020   PLT 230 07/11/2020   Lab Results  Component Value Date   CREATININE 1.59 (H) 08/23/2020   BUN 16 08/23/2020   NA 142 08/23/2020   K 4.5 08/23/2020   CL 100 08/23/2020   CO2 31 08/23/2020   Lab Results  Component Value Date   ALT 23 06/20/2020   AST 23 06/20/2020   ALKPHOS 130 (H) 06/20/2020   BILITOT 2.3 (H) 06/20/2020   No results found for: CHOL, HDL, LDLCALC, LDLDIRECT, TRIG, CHOLHDL  Lab Results  Component Value Date   HGBA1C 5.4 12/05/2019   Lab Results  Component Value Date   TSH 1.221 06/20/2020     STUDIES/PROCEDURES reviewed today   Nuclear stress test 11/02/2016:  Defect 1: There is a small defect of mild severity present in the apex location. This is likely due to apical thinning artifact.  The study is normal.  This is a low risk study.  Nuclear stress EF: 50%. The left ventricle is mildly dilated. __________  2D echo 12/04/2019: 1. Left ventricular ejection fraction, by estimation, is 25 to 30%. The  left ventricle has severely decreased function. The left ventricle  demonstrates global hypokinesis. There is moderate left ventricular  hypertrophy. Left ventricular diastolic  parameters are indeterminate.  2. Right ventricular systolic function is severely reduced. The right  ventricular size is normal. There is moderately elevated pulmonary artery  systolic pressure. The estimated right ventricular systolic pressure is  64.8 mmHg.  3. Left atrial size was mildly dilated.  4. Right atrial size was mildly dilated.  5. The  mitral valve is normal in structure. Mild mitral valve  regurgitation.  6. The aortic valve was not well visualized. Aortic valve  regurgitation  is not visualized. No aortic stenosis is present.  7. The inferior vena cava is dilated in size with <50% respiratory  variability, suggesting right atrial pressure of 15 mmHg. __________  [facility] 12/07/2019: Coronary dominance: Right  Left mainstem: Large vessel that bifurcates into the LAD and left circumflex, no significant disease noted  Left anterior descending (LAD): Large vessel that extends to the apical region, diagonal branch 2 of moderate size, no significant disease noted, mild diffuse disease LAD, diagonal vessels  Left circumflex (LCx): Large vessel with OM branch 3, OM1 with mild 40% proximal disease, OM 2 is essentially occluded in the proximal region with collaterals/small, atretic, OM 3 has 60% mid vessel disease  Right coronary artery (RCA): Right dominant vessel with PL and PDA, 50% ostial disease, 30% proximal to mid vessel disease, 60% PL branch disease  Left ventriculography: Left ventricular systolic function is severely depressed, mildly dilated LV, LVEF is estimated at 25%, no significant aortic valve stenosis Mild to moderate MR  Right heart pressures RA 10 mmHg RV 45/5, 8 PA 47/27, mean 36 Wedge pressure mean 20 LV 137/13 Ao 129/91  Cardiac output 3.31 cardiac index 1.58   Final Conclusions:  Severely depressed LV function estimated 25%, Nonobstructive coronary disease Coronary disease out of proportion to depressed ejection fraction -Mildly elevated right heart pressures and wedge   Recommendations:  Continue carvedilol, hydralazine, isosorbide Aspirin, statin Smoking cessation recommended Alcohol cessation recommended (recently moved from "beer to brown liquor") --Start low-dose losartan tomorrow Transition to [facility] as an outpatient -Blood pressure continues to run high, will increase  isosorbide up to 30 daily __________  2D echo 04/05/2020: 1. Left ventricular ejection fraction, by estimation, is 40 to 45%. The  left ventricle has mildly decreased function. Hypokinesis of basal  regions.There is moderate left ventricular hypertrophy. Left ventricular  diastolic parameters are indeterminate.  2. Right ventricular systolic function is mildly reduced. The right  ventricular size is mildly enlarged. There is mildly elevated pulmonary  artery systolic pressure.  3. Left atrial size was severely dilated.  4. Right atrial size was moderately dilated.  Assessment & Plan    Acute on chronic systolic heart failure secondary to nonischemic cardiomyopathy Right ventricular dysfunction Pulmonary hypertension -- Reports dyspnea with minimal activity.  Significantly volume up on exam.  Reds vest 50%.  He reports increasing his Lasix to Lasix 40 mg twice daily with ongoing volume overload.  He continues to note urine output.  As noted in the past, his cardiomyopathy has been out of proportion to his underlying CAD.  Echo 03/2020 with improving LV SF.  Seen by HF clinic without change in meds. Same day / short stay IV diuresis contacted and unable to accommodate pt due to reported pt load. Recommendation today was thus for increased diuresis with Lasix 80 mg twice daily.  Will obtain BMET to reassess renal function and electrolytes.  Considered transition to torsemide; however, he refills his medications through the [place], and this will delay his diuresis.  In addition to increased diuresis, increased his carvedilol to 37.5 mg twice daily with patient reporting that this will be refilled through the [place] once able.  At RTC, recommend reassessment of volume status and escalation of GDMT as renal function, potassium, and BP allow.  Long discussion regarding CHF education and the importance of daily weights and BP checks.  Long discussion regarding fluid and salt restrictions.  Recommended total  fluids under 2 L daily and total salt under 2 g daily.  Nonobstructive CAD  -- No chest pain.  Reports dyspnea with volume overload noted on exam.  Previous LHC 11/2019 with nonobstructive CAD.  Continue Eliquis in place of ASA.  Continue Coreg, losartan, Imdur, and Lipitor.  Recommend risk factor modification, including smoking cessation.  Discussed diet and activity levels today in great detail.  Persistent atrial fibrillation -- Ventricular rates well controlled today.  Prior echoes with severe left atrium dilation.  Given his severe left atrium dilation, do not recommend DCCV at this time, as he will be unlikely to hold NSR.  He is relatively asymptomatic in atrial fibrillation with recommendation for rate control with carvedilol.  Given CHA2DS2-VASc score of at least 3 (CHF, hypertension, vascular disease), recommend indefinite anticoagulation with Eliquis 5 mg twice daily.  He reports compliance with OAC and denies any signs or symptoms of bleeding.  We will check BMET to reassess renal function electrolytes today.  Hypertensive heart disease -- BP suboptimal today with clonidine 0.1 mg provided in clinic and some improvement in BP.  Suspect further improvement in blood pressure with diuresis.  Continue carvedilol, losartan, Imdur, hydralazine, and increased dose of Lasix.  Recommend low-sodium diet and fluid restriction.  Long discussion regarding control of BP.  [facility] with [facility].  Daily weights and BP measurements encouraged.  CKD -- Obtain BMET today.  Closely monitor with diuresis.  Tobacco use -- Encouraged complete cessation.  Homelessness -- He reports that, at this time, he is able to afford all of his medications.  He reports compliance.  Medication changes: Increase to lasix 80mg  BID with potassium supplementation then drop down to previous dose - updates if needed based on labs. Increase to Coreg 37.5mg  BID.  Labs ordered:  BMET Studies / Imaging ordered: None Future considerations: Escalation of GDMT Disposition: RTC 1 week    , PA-C 09/11/2020

## 2020-09-12 ENCOUNTER — Encounter: Payer: Self-pay | Admitting: *Deleted

## 2020-09-12 ENCOUNTER — Encounter: Payer: Self-pay | Admitting: Physician Assistant

## 2020-09-12 NOTE — Telephone Encounter (Signed)
After multiple failed attempts to reach pt, letter mailed asking pt to call our office to discuss results and provider's recc.

## 2020-10-02 ENCOUNTER — Ambulatory Visit: Payer: No Typology Code available for payment source | Admitting: Family

## 2020-10-02 ENCOUNTER — Telehealth: Payer: Self-pay | Admitting: Family

## 2020-10-02 NOTE — Telephone Encounter (Signed)
Patient did not show for his Heart Failure Clinic appointment on 10/02/20. Will attempt to reschedule.  

## 2021-02-28 ENCOUNTER — Inpatient Hospital Stay (HOSPITAL_COMMUNITY)
Admit: 2021-02-28 | Discharge: 2021-02-28 | Disposition: A | Discharge status: Home / Self Care | Payer: No Typology Code available for payment source | Attending: Family Medicine | Admitting: Family Medicine

## 2021-02-28 ENCOUNTER — Emergency Department: Payer: No Typology Code available for payment source

## 2021-02-28 ENCOUNTER — Encounter: Payer: Self-pay | Admitting: Emergency Medicine

## 2021-02-28 ENCOUNTER — Other Ambulatory Visit: Payer: Self-pay

## 2021-02-28 ENCOUNTER — Inpatient Hospital Stay
Admission: EM | Admit: 2021-02-28 | Discharge: 2021-03-03 | DRG: 291 | Disposition: A | Discharge status: Home / Self Care | Payer: No Typology Code available for payment source | Attending: Obstetrics and Gynecology | Admitting: Obstetrics and Gynecology

## 2021-02-28 DIAGNOSIS — I4819 Other persistent atrial fibrillation: Secondary | ICD-10-CM | POA: Diagnosis not present

## 2021-02-28 DIAGNOSIS — I161 Hypertensive emergency: Secondary | ICD-10-CM | POA: Diagnosis present

## 2021-02-28 DIAGNOSIS — F17210 Nicotine dependence, cigarettes, uncomplicated: Secondary | ICD-10-CM | POA: Diagnosis present

## 2021-02-28 DIAGNOSIS — F1020 Alcohol dependence, uncomplicated: Secondary | ICD-10-CM | POA: Diagnosis present

## 2021-02-28 DIAGNOSIS — I5023 Acute on chronic systolic (congestive) heart failure: Secondary | ICD-10-CM | POA: Diagnosis present

## 2021-02-28 DIAGNOSIS — Z20822 Contact with and (suspected) exposure to covid-19: Secondary | ICD-10-CM | POA: Diagnosis present

## 2021-02-28 DIAGNOSIS — Z833 Family history of diabetes mellitus: Secondary | ICD-10-CM

## 2021-02-28 DIAGNOSIS — I509 Heart failure, unspecified: Secondary | ICD-10-CM

## 2021-02-28 DIAGNOSIS — Z7901 Long term (current) use of anticoagulants: Secondary | ICD-10-CM

## 2021-02-28 DIAGNOSIS — J9601 Acute respiratory failure with hypoxia: Secondary | ICD-10-CM | POA: Diagnosis present

## 2021-02-28 DIAGNOSIS — I272 Pulmonary hypertension, unspecified: Secondary | ICD-10-CM | POA: Diagnosis present

## 2021-02-28 DIAGNOSIS — N1831 Chronic kidney disease, stage 3a: Secondary | ICD-10-CM | POA: Diagnosis present

## 2021-02-28 DIAGNOSIS — I130 Hypertensive heart and chronic kidney disease with heart failure and stage 1 through stage 4 chronic kidney disease, or unspecified chronic kidney disease: Secondary | ICD-10-CM | POA: Diagnosis present

## 2021-02-28 DIAGNOSIS — I2510 Atherosclerotic heart disease of native coronary artery without angina pectoris: Secondary | ICD-10-CM | POA: Diagnosis present

## 2021-02-28 DIAGNOSIS — Z5900 Homelessness unspecified: Secondary | ICD-10-CM | POA: Diagnosis not present

## 2021-02-28 DIAGNOSIS — F1490 Cocaine use, unspecified, uncomplicated: Secondary | ICD-10-CM | POA: Diagnosis present

## 2021-02-28 DIAGNOSIS — E785 Hyperlipidemia, unspecified: Secondary | ICD-10-CM | POA: Diagnosis present

## 2021-02-28 DIAGNOSIS — R0609 Other forms of dyspnea: Secondary | ICD-10-CM

## 2021-02-28 DIAGNOSIS — Z8249 Family history of ischemic heart disease and other diseases of the circulatory system: Secondary | ICD-10-CM

## 2021-02-28 DIAGNOSIS — Z7982 Long term (current) use of aspirin: Secondary | ICD-10-CM

## 2021-02-28 DIAGNOSIS — Z79899 Other long term (current) drug therapy: Secondary | ICD-10-CM | POA: Diagnosis not present

## 2021-02-28 LAB — URINALYSIS, COMPLETE (UACMP) WITH MICROSCOPIC
Bacteria, UA: NONE SEEN
Bilirubin Urine: NEGATIVE
Glucose, UA: NEGATIVE mg/dL
Ketones, ur: NEGATIVE mg/dL
Leukocytes,Ua: NEGATIVE
Nitrite: NEGATIVE
Protein, ur: 100 mg/dL — AB
Specific Gravity, Urine: 1.006 (ref 1.005–1.030)
Squamous Epithelial / HPF: NONE SEEN (ref 0–5)
pH: 6 (ref 5.0–8.0)

## 2021-02-28 LAB — RESP PANEL BY RT-PCR (FLU A&B, COVID) ARPGX2
Influenza A by PCR: NEGATIVE
Influenza B by PCR: NEGATIVE
SARS Coronavirus 2 by RT PCR: NEGATIVE

## 2021-02-28 LAB — TROPONIN I (HIGH SENSITIVITY)
Troponin I (High Sensitivity): 50 ng/L — ABNORMAL HIGH (ref <18)
Troponin I (High Sensitivity): 47 ng/L — ABNORMAL HIGH (ref <18)

## 2021-02-28 LAB — BASIC METABOLIC PANEL
Anion gap: 10 (ref 5–15)
BUN: 12 mg/dL (ref 8–23)
CO2: 22 mmol/L (ref 22–32)
Calcium: 8.8 mg/dL — ABNORMAL LOW (ref 8.9–10.3)
Chloride: 103 mmol/L (ref 98–111)
Creatinine, Ser: 1.3 mg/dL — ABNORMAL HIGH (ref 0.61–1.24)
GFR, Estimated: >60 mL/min (ref >60)
Glucose, Bld: 104 mg/dL — ABNORMAL HIGH (ref 70–99)
Potassium: 4.2 mmol/L (ref 3.5–5.1)
Sodium: 135 mmol/L (ref 135–145)

## 2021-02-28 LAB — PROTIME-INR
INR: 1.1 (ref 0.8–1.2)
Prothrombin Time: 14.6 seconds (ref 11.4–15.2)

## 2021-02-28 LAB — ECHOCARDIOGRAM COMPLETE
AR max vel: 2.07 cm2
AV Area VTI: 3.08 cm2
AV Area mean vel: 2.21 cm2
AV Mean grad: 3 mmHg
AV Peak grad: 4.9 mmHg
Ao pk vel: 1.11 m/s
Area-P 1/2: 7.59 cm2
Height: 69 in
S' Lateral: 4.8 cm
Weight: 3360 oz

## 2021-02-28 LAB — CBC
HCT: 45.4 % (ref 39.0–52.0)
Hemoglobin: 16.8 g/dL (ref 13.0–17.0)
MCH: 32.6 pg (ref 26.0–34.0)
MCHC: 37 g/dL — ABNORMAL HIGH (ref 30.0–36.0)
MCV: 88.2 fL (ref 80.0–100.0)
Platelets: 229 10*3/uL (ref 150–400)
RBC: 5.15 MIL/uL (ref 4.22–5.81)
RDW: 15.5 % (ref 11.5–15.5)
WBC: 13.8 10*3/uL — ABNORMAL HIGH (ref 4.0–10.5)
nRBC: 0 % (ref 0.0–0.2)

## 2021-02-28 LAB — BRAIN NATRIURETIC PEPTIDE: B Natriuretic Peptide: 908.5 pg/mL — ABNORMAL HIGH (ref 0.0–100.0)

## 2021-02-28 MED ORDER — NICOTINE 14 MG/24HR TD PT24
14.0000 mg | MEDICATED_PATCH | Freq: Every day | TRANSDERMAL | Status: DC
  Administered 2021-03-03: 14 mg via TRANSDERMAL
  Filled 2021-02-28 (×2): qty 1

## 2021-02-28 MED ORDER — FUROSEMIDE 10 MG/ML IJ SOLN: 40.0000 mg | Freq: Two times a day (BID) | INTRAMUSCULAR | Status: DC

## 2021-02-28 MED ORDER — ASPIRIN EC 81 MG PO TBEC
81.0000 mg | DELAYED_RELEASE_TABLET | Freq: Every day | ORAL | Status: DC
  Administered 2021-03-01 – 2021-03-03 (×3): 81 mg via ORAL
  Filled 2021-02-28 (×3): qty 1

## 2021-02-28 MED ORDER — APIXABAN 5 MG PO TABS
5.0000 mg | ORAL_TABLET | Freq: Two times a day (BID) | ORAL | Status: DC
  Administered 2021-02-28 – 2021-03-03 (×7): 5 mg via ORAL
  Filled 2021-02-28 (×7): qty 1

## 2021-02-28 MED ORDER — ONDANSETRON HCL 4 MG PO TABS: 4.0000 mg | ORAL_TABLET | Freq: Four times a day (QID) | ORAL | Status: DC | PRN

## 2021-02-28 MED ORDER — ONDANSETRON HCL 4 MG/2ML IJ SOLN: 4.0000 mg | Freq: Four times a day (QID) | INTRAMUSCULAR | Status: DC | PRN

## 2021-02-28 MED ORDER — CARVEDILOL 25 MG PO TABS
25.0000 mg | ORAL_TABLET | Freq: Two times a day (BID) | ORAL | Status: DC
  Administered 2021-02-28 – 2021-03-03 (×6): 25 mg via ORAL
  Filled 2021-02-28 (×3): qty 1
  Filled 2021-02-28: qty 4
  Filled 2021-02-28: qty 1
  Filled 2021-02-28: qty 4

## 2021-02-28 MED ORDER — LOSARTAN POTASSIUM 50 MG PO TABS: 25.0000 mg | ORAL_TABLET | Freq: Every day | ORAL | Status: DC

## 2021-02-28 MED ORDER — ACETAMINOPHEN 650 MG RE SUPP: 650.0000 mg | Freq: Four times a day (QID) | RECTAL | Status: DC | PRN

## 2021-02-28 MED ORDER — HYDROCODONE-ACETAMINOPHEN 5-325 MG PO TABS
1.0000 | ORAL_TABLET | ORAL | Status: DC | PRN
  Administered 2021-02-28 – 2021-03-01 (×2): 2 via ORAL
  Administered 2021-03-01 (×3): 1 via ORAL
  Administered 2021-03-02 – 2021-03-03 (×6): 2 via ORAL
  Filled 2021-02-28: qty 2
  Filled 2021-02-28 (×2): qty 1
  Filled 2021-02-28 (×3): qty 2
  Filled 2021-02-28: qty 1
  Filled 2021-02-28 (×4): qty 2

## 2021-02-28 MED ORDER — HYDRALAZINE HCL 50 MG PO TABS
50.0000 mg | ORAL_TABLET | Freq: Three times a day (TID) | ORAL | Status: DC
  Administered 2021-02-28 – 2021-03-03 (×9): 50 mg via ORAL
  Filled 2021-02-28 (×8): qty 1

## 2021-02-28 MED ORDER — MORPHINE SULFATE (PF) 2 MG/ML IV SOLN: 2.0000 mg | INTRAVENOUS | Status: DC | PRN

## 2021-02-28 MED ORDER — LOSARTAN POTASSIUM 50 MG PO TABS
50.0000 mg | ORAL_TABLET | Freq: Every day | ORAL | Status: DC
  Administered 2021-02-28 – 2021-03-03 (×4): 50 mg via ORAL
  Filled 2021-02-28 (×4): qty 1

## 2021-02-28 MED ORDER — FUROSEMIDE 10 MG/ML IJ SOLN
60.0000 mg | Freq: Once | INTRAMUSCULAR | Status: AC
  Administered 2021-02-28: 60 mg via INTRAVENOUS
  Filled 2021-02-28: qty 8

## 2021-02-28 MED ORDER — NITROGLYCERIN 0.4 MG SL SUBL: 0.4000 mg | SUBLINGUAL_TABLET | SUBLINGUAL | Status: DC | PRN

## 2021-02-28 MED ORDER — FUROSEMIDE 10 MG/ML IJ SOLN: 60.0000 mg | Freq: Once | INTRAMUSCULAR | Status: DC

## 2021-02-28 MED ORDER — ACETAMINOPHEN 325 MG PO TABS: 650.0000 mg | ORAL_TABLET | Freq: Four times a day (QID) | ORAL | Status: DC | PRN

## 2021-02-28 MED ORDER — ASPIRIN 81 MG PO CHEW
324.0000 mg | CHEWABLE_TABLET | Freq: Once | ORAL | Status: AC
  Administered 2021-02-28: 324 mg via ORAL
  Filled 2021-02-28: qty 4

## 2021-02-28 MED ORDER — NITROGLYCERIN 2 % TD OINT
1.0000 [in_us] | TOPICAL_OINTMENT | Freq: Once | TRANSDERMAL | Status: AC
  Administered 2021-02-28: 1 [in_us] via TOPICAL
  Filled 2021-02-28: qty 1

## 2021-02-28 MED ORDER — POLYETHYLENE GLYCOL 3350 17 G PO PACK: 17.0000 g | PACK | Freq: Every day | ORAL | Status: DC | PRN

## 2021-02-28 MED ORDER — METOPROLOL TARTRATE 5 MG/5ML IV SOLN
5.0000 mg | INTRAVENOUS | Status: DC | PRN
  Administered 2021-02-28 (×2): 5 mg via INTRAVENOUS
  Filled 2021-02-28 (×2): qty 5

## 2021-02-28 MED ORDER — ISOSORBIDE MONONITRATE ER 30 MG PO TB24
15.0000 mg | ORAL_TABLET | Freq: Every day | ORAL | Status: DC
  Administered 2021-02-28 – 2021-03-03 (×4): 15 mg via ORAL
  Filled 2021-02-28 (×5): qty 1

## 2021-02-28 MED ORDER — ATORVASTATIN CALCIUM 80 MG PO TABS
80.0000 mg | ORAL_TABLET | Freq: Every day | ORAL | Status: DC
  Administered 2021-02-28 – 2021-03-03 (×4): 80 mg via ORAL
  Filled 2021-02-28: qty 1
  Filled 2021-02-28 (×2): qty 4
  Filled 2021-02-28 (×2): qty 1

## 2021-02-28 NOTE — H&P (Signed)
History and Physical    Gregory Howell NUU:[id number] DOB: 1961-04-14 DOA: 02/28/2021  PCP: [facility]  Chief Complaint: Shortness of breath  HPI: Gregory Howell is a 60 y.o. male with a past medical history of nonischemic cardiomyopathy, congestive heart failure with mildly reduced ejection fraction 40 to 45% based off echo on 8/21, coronary artery disease, persistent atrial fibrillation on Eliquis, essential hypertension, tobacco dependence, alcohol dependence, chronic kidney disease stage II/III.  The patient presented to the emergency department due to shortness of breath and orthopnea as well as dyspnea on exertion that started yesterday.  He is mildly tachypneic and hypoxic in the emergency room.  Denies any chest pain.  States he has missed a few doses of his home Lasix over the last few days.  Denies any fevers or chills.  Complaining of left flank pain.  CT renal study was done which was unremarkable for any obstructive uropathy.  He has urinated approximately 600 cc of golden urine in the urinal jug after getting IV Lasix in the emergency department.  States he drinks 1 beer per day.  Denies any history of withdrawals or DTs.  Currently still smoking tobacco about 4 to 5 cigarettes/day.  Admits to a dry cough.  Has 1+ bilateral lower extremity edema.  No nausea, vomiting diarrhea or abdominal pain.  No dysuria or urinary frequency.    ED Course: The patient has been given aspirin 324 mg x 1, Lasix 60 mg IV x1, nitroglycerin 2% ointment 15 mg.  Chest x-ray and CT renal stone study.  Placed on simple nasal cannula 2 L.  Review of Systems: 14 point review of systems is negative except for what is mentioned above in the HPI.   Past Medical History:  Diagnosis Date   CHF (congestive heart failure) (HCC)    CKD (chronic kidney disease), stage II    Coronary artery disease, non-occlusive    HFrEF (heart failure with reduced ejection fraction) (HCC)    Hypertension     Hypertensive heart disease    NICM (nonischemic cardiomyopathy) (HCC)    Persistent atrial fibrillation (HCC)     Past Surgical History:  Procedure Laterality Date   RIGHT/LEFT HEART CATH AND CORONARY ANGIOGRAPHY N/A 12/07/2019   Procedure: RIGHT/LEFT HEART CATH AND CORONARY ANGIOGRAPHY;  Surgeon: Antonieta Iba, MD;  Location: [facility] INVASIVE CV LAB;  Service: Cardiovascular;  Laterality: N/A;    Social History   Socioeconomic History   Marital status: Married    Spouse name: Not on file   Number of children: Not on file   Years of education: Not on file   Highest education level: Not on file  Occupational History   Not on file  Tobacco Use   Smoking status: Some Days    Packs/day: 0.50    Types: Cigarettes   Smokeless tobacco: Never  Substance and Sexual Activity   Alcohol use: Yes    Alcohol/week: 7.0 - 14.0 standard drinks    Types: 7 - 14 Cans of beer per week    Comment: daily   Drug use: Never   Sexual activity: Not on file  Other Topics Concern   Not on file  Social History Narrative   Not on file   Social Determinants of Health   Financial Resource Strain: Not on file  Food Insecurity: Not on file  Transportation Needs: Not on file  Physical Activity: Not on file  Stress: Not on file  Social Connections: Not on file  Intimate Partner Violence: Not on file    No Known Allergies  Family History  Problem Relation Age of Onset   Hypertension Mother    Hypertension Father    CAD Brother    Diabetes Brother     Prior to Admission medications   Medication Sig Start Date End Date Taking? Authorizing Provider  apixaban (ELIQUIS) 5 MG TABS tablet TAKE ONE TABLET BY MOUTH EVERY 12 HOURS CAUTION BLOOD THINNER 07/19/20  Yes [provider]  aspirin EC 81 MG EC tablet Take 1 tablet (81 mg total) by mouth daily. 11/03/16  Yes Sudini, Wardell Heath, MD  furosemide (LASIX) 40 MG tablet TAKE ONE TABLET BY MOUTH EVERY DAY FOR BLOOD PRESSURE AND FOR FLUID CONTROL  07/19/20  Yes [provider]  hydrALAZINE (APRESOLINE) 50 MG tablet TAKE ONE TABLET BY MOUTH EVERY 8 HOURS FOR BLOOD PRESSURE 07/19/20  Yes [provider]  isosorbide mononitrate (IMDUR) 30 MG 24 hr tablet TAKE ONE-HALF TABLET BY MOUTH ONCE EVERY DAY FOR HEART (DO NOT TAKE CIALIS, VIAGRA OR LEVITRA WHILE YOU ARE ON THIS MEDICATION). 07/19/20  Yes [provider]  losartan (COZAAR) 25 MG tablet Take 1 tablet by mouth daily. 07/19/20  Yes [provider]  apixaban (ELIQUIS) 5 MG TABS tablet Take 1 tablet (5 mg total) by mouth 2 (two) times daily. 06/23/20 09/21/20  Darlin Priestly, MD  atorvastatin (LIPITOR) 80 MG tablet Take 1 tablet (80 mg total) by mouth daily. 06/23/20 09/21/20  Darlin Priestly, MD  carvedilol (COREG) 25 MG tablet Take 1.5 tablets (37.5 mg total) by mouth 2 (two) times daily with a meal. 08/23/20 11/21/20  Marisue Ivan D, PA-C  furosemide (LASIX) 40 MG tablet Take 1 tablet (40 mg total) by mouth daily. Patient taking differently: Take 40 mg by mouth 2 (two) times daily. 07/12/20 09/10/20  Arnetha Courser, MD  hydrALAZINE (APRESOLINE) 50 MG tablet Take 1 tablet (50 mg total) by mouth every 8 (eight) hours. 06/23/20 09/21/20  Darlin Priestly, MD  isosorbide mononitrate (IMDUR) 30 MG 24 hr tablet Take 1 tablet (30 mg total) by mouth daily. 06/23/20 09/21/20  Darlin Priestly, MD  losartan (COZAAR) 50 MG tablet Take 1 tablet (50 mg total) by mouth daily. 06/23/20 09/21/20  Darlin Priestly, MD  potassium chloride SA (KLOR-CON M20) 20 MEQ tablet Take 2 tablets (40 mEq total) by mouth 2 (two) times daily. 08/23/20 09/22/20  Marisue Ivan D, PA-C    Physical Exam: Vitals:   02/28/21 1250 02/28/21 1300 02/28/21 1310 02/28/21 1315  BP:  (!) 171/149    Pulse: 61 63 (!) 119 (!) 101  Resp: (!) 43 (!) 51 (!) 30 (!) 22  Temp:      TempSrc:      SpO2: 96% 94% 99% 95%  Weight:      Height:         General:  Appears calm and comfortable and is in NAD Cardiovascular: Irregular rhythm  and irregular rate Respiratory:   Diffuse crackles on lung auscultation Abdomen:  soft, NT, ND, NABS Skin:  no rash or induration seen on limited exam Musculoskeletal:  grossly normal tone BUE/BLE, good ROM, no bony abnormality Lower extremity: 1+ pitting edema bilateral lower extremity.  Limited foot exam with no ulcerations.  2+ distal pulses. Psychiatric:  grossly normal mood and affect, speech fluent and appropriate, AOx3 Neurologic:  CN 2-12 grossly intact, moves all extremities in coordinated fashion, sensation intact    Radiological Exams on Admission: Independently reviewed - see discussion in  A/P where applicable  DG Chest 2 View  Result Date: 02/28/2021 CLINICAL DATA:  Shortness of breath. EXAM: CHEST - 2 VIEW COMPARISON:  07/11/2020 FINDINGS: Heart size upper normal to mildly enlarged. There is pulmonary vascular congestion without overt pulmonary edema. Diffuse interstitial opacity suggests edema. No focal airspace consolidation. Tiny effusions noted on the lateral film. The visualized bony structures of the thorax show no acute abnormality. IMPRESSION: Upper normal heart size to borderline cardiomegaly with diffuse interstitial pulmonary edema pattern. Tiny bilateral pleural effusions. Electronically Signed   By: Kennith Center M.D.   On: 02/28/2021 12:16   CT Renal Stone Study  Result Date: 02/28/2021 CLINICAL DATA:  Left flank pain.  Kidney stones suspected. EXAM: CT ABDOMEN AND PELVIS WITHOUT CONTRAST TECHNIQUE: Multidetector CT imaging of the abdomen and pelvis was performed following the standard protocol without IV contrast. COMPARISON:  12/04/2019 FINDINGS: Lower chest: Tiny bilateral pleural effusions. Interstitial prominence without features of overt pulmonary edema. Hepatobiliary: Probable mild fatty deposition in the liver. Subtle nodular liver contour raises concern for cirrhosis. Liver measures 17.6 cm craniocaudal length, enlarged. Gallbladder is nondistended. No  intrahepatic or extrahepatic biliary dilation. Pancreas: No focal mass lesion. No dilatation of the main duct. No intraparenchymal cyst. No peripancreatic edema. Spleen: No splenomegaly. No focal mass lesion. Adrenals/Urinary Tract: No adrenal nodule or mass. Unremarkable noncontrast appearance of the kidneys. No evidence for hydroureter. The urinary bladder appears normal for the degree of distention. Right anterior bladder is retracted towards a right groin hernia. Stomach/Bowel: Stomach is nondistended. Duodenum is normally positioned as is the ligament of Treitz. No small bowel wall thickening. No small bowel dilatation. The terminal ileum is normal. The appendix is normal. No gross colonic mass. No colonic wall thickening. Diverticular changes are noted in the left colon without evidence of diverticulitis. Vascular/Lymphatic: There is moderate atherosclerotic calcification of the abdominal aorta without aneurysm. There is no gastrohepatic or hepatoduodenal ligament lymphadenopathy. No retroperitoneal or mesenteric lymphadenopathy. No pelvic sidewall lymphadenopathy. Reproductive: Prostate gland appears mildly enlarged. Other: No intraperitoneal free fluid. Musculoskeletal: Small umbilical hernia contains only fat. Left groin hernia contains only fat although the anterior left bladder wall is retracted towards the hernia. No worrisome lytic or sclerotic osseous abnormality. Degenerative changes are noted in both hips. IMPRESSION: 1. No acute findings in the abdomen or pelvis. Specifically, no findings to explain the patient's history of left flank pain. 2. Tiny bilateral pleural effusions. 3. Hepatomegaly. Subtle nodularity of liver contour raises concern for cirrhosis. 4. Left groin hernia contains only fat. Anterior left bladder wall is retracted towards the hernia. 5. Aortic Atherosclerosis (ICD10-I70.0). Electronically Signed   By: Kennith Center M.D.   On: 02/28/2021 12:23    EKG: Independently reviewed.   Atrial flutter with rate 111 with variable AV block and nonspecific ST changes  Labs on Admission: I have personally reviewed the available labs and imaging studies at the time of the admission.  Pertinent labs: Creatinine 1.30, initial troponin 50, blood glucose 104, BNP 908, WBC 13.8     Assessment/Plan: Acute hypoxic respiratory failure secondary to acute on chronic CHF decompensation with reduced ejection fraction: The patient will be admitted to the progressive cardiac unit under inpatient status.  Meets hypoxic respiratory failure criteria being 88% on room air via pulse ox.  Now on 2 L in the emergency department saturating in the mid 90s.  He has been given Lasix 60 mg IV x1 in the emergency department.  We will start Lasix 40 mg IV  twice daily starting tomorrow morning.  Monitor urine output.  Strict I's and O's, daily weights fluid restriction and low-salt diet.  Echocardiogram has been ordered.  Cardiology has been consulted.  Chest x-ray showed bilateral pleural effusions cardiomegaly and diffuse interstitial pulmonary edema.  Hold home p.o. Lasix.  Continue home medications including: Coreg 25 mg twice daily, Imdur 15 mg daily.  Atrial flutter mild rapid ventricular response: Heart rate in the 1 teens to 120s.  Continue home Coreg 25 mg twice daily.  Start Lopressor 5 mg IV every 5 minutes as needed for heart rate greater than 110 x3 doses.  If heart rates remain elevated consider starting Cardizem drip.  Continue home Eliquis 5 mg twice daily.  Elevated high-sensitivity troponin: Likely secondary to demand ischemia.  Denies any chest pain.  No acute ischemic changes on EKG.  Left flank pain: CT renal stone study was negative for any obstructive uropathy.  The patient does have a leukocytosis.  We will obtain a urinalysis. He denies any dysuria or urinary frequency.  Chronic kidney disease stage II/IIIA: Serum creatinine appears at baseline.  Continue monitoring closely as the patient  will be getting IV diuresis.  Hypertensive emergency: He has been given nitroglycerin in the emergency department.  Heart rate is in the 150s systolic and diastolics in the 120s and 130s.  We will increase patient's home Cozaar from 25 mg daily to 50 mg daily.  Continue home hydralazine 50 mg 3 times daily.  Tobacco dependence: Start nicotine patch.  Counseled on tobacco cessation  Alcohol dependence: States he drinks 1 beer per day.  Denies any history of withdrawals or DTs.  Counseled on alcohol cessation.  Hyperlipidemia: Continue home Lipitor 80 mg daily  Coronary artery disease: Continue home aspirin 81 mg daily  Level of Care: Cardiac progressive DVT prophylaxis: Home Eliquis Code Status: Full code Consults: Cardiology Admission status: Inpatient   Verdia Kuba DO [facility]   How to contact the [facility] Attending or Consulting provider 7A - 7P or covering provider during after hours 7P -7A, for this patient?  Check the care team in [facility] and look for a) attending/consulting [facility] provider listed and b) the [facility] team listed Log into [email or web address] and use [facility]'s universal password to access. If you do not have the password, please contact the hospital operator. Locate the [facility] provider you are looking for under [facility] and page to a number that you can be directly reached. If you still have difficulty reaching the provider, please page the [facility] (Director on Call) for the Hospitalists listed on amion for assistance.   02/28/2021, 1:21 PM

## 2021-02-28 NOTE — ED Notes (Signed)
Patient ambulatory to restroom with steady gait.

## 2021-02-28 NOTE — ED Triage Notes (Signed)
Pt reports that yesterday he developed left flank pain and it hurt all day, last night he then developed [facility], he is also complaining of his urine is orange.

## 2021-02-28 NOTE — ED Notes (Signed)
Admitting provider at bedside.

## 2021-02-28 NOTE — ED Notes (Signed)
See triage note  Presents with some trouble breathing  States started couple of days ago  No fever or cough but did have some left flank pain

## 2021-02-28 NOTE — Progress Notes (Signed)
*  PRELIMINARY RESULTS* Echocardiogram 2D Echocardiogram has been performed.  Cristela Blue 02/28/2021, 2:38 PM

## 2021-02-28 NOTE — ED Notes (Signed)
This RN to bedside, pt's O2 noted to be intermittently 86-92%, pt noted to be on RA, [place] placed back on patient at 2L at this time.

## 2021-02-28 NOTE — ED Provider Notes (Signed)
[facility] Emergency Department Provider Note    Event Date/Time   First MD Initiated Contact with Patient 02/28/21 1117     (approximate)  I have reviewed the triage vital signs and the nursing notes.   HISTORY  Chief Complaint Shortness of Breath    HPI Gregory Howell is a 60 y.o. male below listed past medical history presents to the ER for evaluation of left flank pain associated with worsening shortness of breath and orthopnea over the past 24 hours.  Has noted some orange-colored urine.  Pain comes and goes.  No history of kidney stone.  Denies any chest pain.  States he been compliant with his medications.  Denies any chest pain at this time.  Past Medical History:  Diagnosis Date   CHF (congestive heart failure) (HCC)    CKD (chronic kidney disease), stage II    Coronary artery disease, non-occlusive    HFrEF (heart failure with reduced ejection fraction) (HCC)    Hypertension    Hypertensive heart disease    NICM (nonischemic cardiomyopathy) (HCC)    Persistent atrial fibrillation (HCC)    Family History  Problem Relation Age of Onset   Hypertension Mother    Hypertension Father    CAD Brother    Diabetes Brother    Past Surgical History:  Procedure Laterality Date   RIGHT/LEFT HEART CATH AND CORONARY ANGIOGRAPHY N/A 12/07/2019   Procedure: RIGHT/LEFT HEART CATH AND CORONARY ANGIOGRAPHY;  Surgeon: Antonieta Iba, MD;  Location: [facility] INVASIVE CV LAB;  Service: Cardiovascular;  Laterality: N/A;   Patient Active Problem List   Diagnosis Date Noted   Volume overload state of heart 07/12/2020   Acute on chronic combined systolic (congestive) and diastolic (congestive) heart failure (HCC) 07/11/2020   Persistent atrial fibrillation (HCC)    NICM (nonischemic cardiomyopathy) (HCC)    Non compliance w medication regimen    Acute on chronic HFrEF (heart failure with reduced ejection fraction) (HCC) 06/19/2020   CKD (chronic kidney  disease), stage III (HCC) 06/19/2020   Acute on chronic combined systolic and diastolic CHF (congestive heart failure) (HCC) 04/05/2020   Nonobstructive atherosclerosis of coronary artery 12/07/2019   AKI (acute kidney injury) (HCC) 12/07/2019   Acute HFrEF (heart failure with reduced ejection fraction) (HCC)    Atrial fibrillation with RVR (HCC) 12/04/2019   Substance induced mood disorder (HCC) 02/22/2017   Alcohol intoxication (HCC) 02/22/2017   Chest pain with moderate risk for cardiac etiology 11/02/2016   Hypertensive urgency 11/02/2016   Tobacco abuse 11/02/2016   Leukocytosis 11/02/2016      Prior to Admission medications   Medication Sig Start Date End Date Taking? Authorizing Provider  apixaban (ELIQUIS) 5 MG TABS tablet Take 1 tablet (5 mg total) by mouth 2 (two) times daily. 06/23/20 09/21/20  Darlin Priestly, MD  aspirin EC 81 MG EC tablet Take 1 tablet (81 mg total) by mouth daily. 11/03/16   Milagros Loll, MD  atorvastatin (LIPITOR) 80 MG tablet Take 1 tablet (80 mg total) by mouth daily. 06/23/20 09/21/20  Darlin Priestly, MD  carvedilol (COREG) 25 MG tablet Take 1.5 tablets (37.5 mg total) by mouth 2 (two) times daily with a meal. 08/23/20 11/21/20  Marisue Ivan D, PA-C  furosemide (LASIX) 40 MG tablet Take 1 tablet (40 mg total) by mouth daily. Patient taking differently: Take 40 mg by mouth 2 (two) times daily. 07/12/20 09/10/20  Arnetha Courser, MD  hydrALAZINE (APRESOLINE) 50 MG tablet Take 1 tablet (50  mg total) by mouth every 8 (eight) hours. 06/23/20 09/21/20  Darlin Priestly, MD  isosorbide mononitrate (IMDUR) 30 MG 24 hr tablet Take 1 tablet (30 mg total) by mouth daily. 06/23/20 09/21/20  Darlin Priestly, MD  losartan (COZAAR) 50 MG tablet Take 1 tablet (50 mg total) by mouth daily. 06/23/20 09/21/20  Darlin Priestly, MD  methocarbamol (ROBAXIN) 500 MG tablet Take 1 tablet (500 mg total) by mouth every 8 (eight) hours as needed for muscle spasms (cramping). 04/07/20   Pennie Banter, DO   potassium chloride SA (KLOR-CON M20) 20 MEQ tablet Take 2 tablets (40 mEq total) by mouth 2 (two) times daily. 08/23/20 09/22/20  Marisue Ivan D, PA-C    Allergies Patient has no known allergies.    Social History Social History   Tobacco Use   Smoking status: Some Days    Packs/day: 0.50    Types: Cigarettes   Smokeless tobacco: Never  Substance Use Topics   Alcohol use: Yes    Alcohol/week: 7.0 - 14.0 standard drinks    Types: 7 - 14 Cans of beer per week    Comment: daily   Drug use: Never    Review of Systems Patient denies headaches, rhinorrhea, blurry vision, numbness, shortness of breath, chest pain, edema, cough, abdominal pain, nausea, vomiting, diarrhea, dysuria, fevers, rashes or hallucinations unless otherwise stated above in HPI. ____________________________________________   PHYSICAL EXAM:  VITAL SIGNS: Vitals:   02/28/21 1240 02/28/21 1245  BP: (!) 159/131   Pulse: (!) 122   Resp: (!) 35   Temp:    SpO2: (!) 88% 95%    Constitutional: Alert and oriented.  Eyes: Conjunctivae are normal.  Head: Atraumatic. Nose: No congestion/rhinnorhea. Mouth/Throat: Mucous membranes are moist.   Neck: No stridor. Painless ROM.  Cardiovascular: mildly fast rate, irregular rhythm. Grossly normal heart sounds.  Good peripheral circulation. Respiratory: Normal respiratory effort.  No retractions. Lungs CTAB. Gastrointestinal: Soft and nontender. No distention. No abdominal bruits. No CVA tenderness. Genitourinary:  Musculoskeletal: No lower extremity tenderness. 1+ BLEdema.  No joint effusions. Neurologic:  Normal speech and language. No gross focal neurologic deficits are appreciated. No facial droop Skin:  Skin is warm, dry and intact. No rash noted. Psychiatric: Mood and affect are normal. Speech and behavior are normal.  ____________________________________________   LABS (all labs ordered are listed, but only abnormal results are displayed)  Results  for orders placed or performed during the hospital encounter of 02/28/21 (from the past 24 hour(s))  Basic metabolic panel     Status: Abnormal   Collection Time: 02/28/21 10:12 AM  Result Value Ref Range   Sodium 135 135 - 145 mmol/L   Potassium 4.2 3.5 - 5.1 mmol/L   Chloride 103 98 - 111 mmol/L   CO2 22 22 - 32 mmol/L   Glucose, Bld 104 (H) 70 - 99 mg/dL   BUN 12 8 - 23 mg/dL   Creatinine, Ser 3.42 (H) 0.61 - 1.24 mg/dL   Calcium 8.8 (L) 8.9 - 10.3 mg/dL   GFR, Estimated >87 >68 mL/min   Anion gap 10 5 - 15  CBC     Status: Abnormal   Collection Time: 02/28/21 10:12 AM  Result Value Ref Range   WBC 13.8 (H) 4.0 - 10.5 K/uL   RBC 5.15 4.22 - 5.81 MIL/uL   Hemoglobin 16.8 13.0 - 17.0 g/dL   HCT 11.5 72.6 - 20.3 %   MCV 88.2 80.0 - 100.0 fL   MCH 32.6 26.0 -  34.0 pg   MCHC 37.0 (H) 30.0 - 36.0 g/dL   RDW 10.2 58.5 - 27.7 %   Platelets 229 150 - 400 K/uL   nRBC 0.0 0.0 - 0.2 %  Troponin I (High Sensitivity)     Status: Abnormal   Collection Time: 02/28/21 10:12 AM  Result Value Ref Range   Troponin I (High Sensitivity) 50 (H) <18 ng/L  Protime-INR (order if Patient is taking Coumadin / Warfarin)     Status: None   Collection Time: 02/28/21 10:12 AM  Result Value Ref Range   Prothrombin Time 14.6 11.4 - 15.2 seconds   INR 1.1 0.8 - 1.2  Brain natriuretic peptide     Status: Abnormal   Collection Time: 02/28/21 10:13 AM  Result Value Ref Range   B Natriuretic Peptide 908.5 (H) 0.0 - 100.0 pg/mL  Resp Panel by RT-PCR (Flu A&B, Covid) Nasopharyngeal Swab     Status: None   Collection Time: 02/28/21 11:35 AM   Specimen: Nasopharyngeal Swab; Nasopharyngeal(NP) swabs in vial transport medium  Result Value Ref Range   SARS Coronavirus 2 by RT PCR NEGATIVE NEGATIVE   Influenza A by PCR NEGATIVE NEGATIVE   Influenza B by PCR NEGATIVE NEGATIVE   ____________________________________________  EKG My review and personal interpretation at Time: 10:02   Indication: sob  Rate: 110   Rhythm: variably conducted aflutter with rvr Axis: normal Other: normal intervals, no stemi ____________________________________________  RADIOLOGY  I personally reviewed all radiographic images ordered to evaluate for the above acute complaints and reviewed radiology reports and findings.  These findings were personally discussed with the patient.  Please see medical record for radiology report.  ____________________________________________   PROCEDURES  Procedure(s) performed:  .Critical Care  Date/Time: 02/28/2021 12:46 PM Performed by: Willy Eddy, MD Authorized by: Willy Eddy, MD   Critical care provider statement:    Critical care time (minutes):  35   Critical care time was exclusive of:  Separately billable procedures and treating other patients   Critical care was necessary to treat or prevent imminent or life-threatening deterioration of the following conditions:  Respiratory failure and cardiac failure   Critical care was time spent personally by me on the following activities:  Development of treatment plan with patient or surrogate, discussions with consultants, evaluation of patient's response to treatment, examination of patient, obtaining history from patient or surrogate, ordering and performing treatments and interventions, ordering and review of laboratory studies, ordering and review of radiographic studies, pulse oximetry, re-evaluation of patient's condition and review of old charts    Critical Care performed: yes ____________________________________________   INITIAL IMPRESSION / ASSESSMENT AND PLAN / ED COURSE  Pertinent labs & imaging results that were available during my care of the patient were reviewed by me and considered in my medical decision making (see chart for details).   DDX: Asthma, copd, CHF, pna, ptx, malignancy, Pe, anemia   Breno Remo is a 60 y.o. who presents to the ED with presentation as described above.  Patient  extensive cardiac and CHF history.  Also complained of left flank pain denies any chest pain.  Patient's EKG shows a flutter with variable conduction nonspecific ST changes.  Troponin is mildly elevated.  BNP also elevated and chest x-ray concerning for CHF.  Will order Lasix.  Have a lower suspicion for PE as he is on Eliquis and states has been compliant with his medications.  States he did miss a few doses of her Lasix over the past  few days.  Clinical Course as of 02/28/21 1247  Fri Feb 28, 2021  1244 Patient noted to be increasingly short of breath.  Not have any chest pain above concern for worsening acute on chronic congestive heart failure.  Found to be hypoxic to 88% on room air tachypneic.  Placed on supplemental oxygen and have ordered IV Lasix.  Diastolic pressure still elevated have ordered nitro glycerin.  Heart rate in the 1 teens 120s will order Lopressor as well as an additional dose of his carvedilol may need to proceed with Cardizem infusion.  Patient will require hospitalization. [PR]    Clinical Course User Index [PR] Willy Eddy, MD    The patient was evaluated in Emergency Department today for the symptoms described in the history of present illness. He/she was evaluated in the context of the global COVID-19 pandemic, which necessitated consideration that the patient might be at risk for infection with the SARS-CoV-2 virus that causes COVID-19. Institutional protocols and algorithms that pertain to the evaluation of patients at risk for COVID-19 are in a state of rapid change based on information released by regulatory bodies including the CDC and federal and state organizations. These policies and algorithms were followed during the patient's care in the ED.  As part of my medical decision making, I reviewed the following data within the electronic MEDICAL RECORD NUMBER Nursing notes reviewed and incorporated, Labs reviewed, notes from prior ED visits and [place] Controlled Substance  Database   ____________________________________________   FINAL CLINICAL IMPRESSION(S) / ED DIAGNOSES  Final diagnoses:  Acute respiratory failure with hypoxia (HCC)  Acute on chronic congestive heart failure, unspecified heart failure type (HCC)      NEW MEDICATIONS STARTED DURING THIS VISIT:  New Prescriptions   No medications on file     Note:  This document was prepared using Dragon voice recognition software and may include unintentional dictation errors.    Willy Eddy, MD 02/28/21 1247

## 2021-03-01 ENCOUNTER — Other Ambulatory Visit: Payer: Self-pay

## 2021-03-01 ENCOUNTER — Encounter: Payer: Self-pay | Admitting: Family Medicine

## 2021-03-01 DIAGNOSIS — I5023 Acute on chronic systolic (congestive) heart failure: Secondary | ICD-10-CM

## 2021-03-01 DIAGNOSIS — F1421 Cocaine dependence, in remission: Secondary | ICD-10-CM | POA: Insufficient documentation

## 2021-03-01 DIAGNOSIS — Z5900 Homelessness unspecified: Secondary | ICD-10-CM | POA: Insufficient documentation

## 2021-03-01 DIAGNOSIS — I10 Essential (primary) hypertension: Secondary | ICD-10-CM | POA: Insufficient documentation

## 2021-03-01 LAB — COMPREHENSIVE METABOLIC PANEL
ALT: 26 U/L (ref 0–44)
AST: 24 U/L (ref 15–41)
Albumin: 3.5 g/dL (ref 3.5–5.0)
Alkaline Phosphatase: 100 U/L (ref 38–126)
Anion gap: 9 (ref 5–15)
BUN: 18 mg/dL (ref 8–23)
CO2: 25 mmol/L (ref 22–32)
Calcium: 8.4 mg/dL — ABNORMAL LOW (ref 8.9–10.3)
Chloride: 100 mmol/L (ref 98–111)
Creatinine, Ser: 1.49 mg/dL — ABNORMAL HIGH (ref 0.61–1.24)
GFR, Estimated: 53 mL/min — ABNORMAL LOW (ref >60)
Glucose, Bld: 103 mg/dL — ABNORMAL HIGH (ref 70–99)
Potassium: 4.1 mmol/L (ref 3.5–5.1)
Sodium: 134 mmol/L — ABNORMAL LOW (ref 135–145)
Total Bilirubin: 2.5 mg/dL — ABNORMAL HIGH (ref 0.3–1.2)
Total Protein: 7.1 g/dL (ref 6.5–8.1)

## 2021-03-01 LAB — CBC
HCT: 40.7 % (ref 39.0–52.0)
Hemoglobin: 15.1 g/dL (ref 13.0–17.0)
MCH: 32.5 pg (ref 26.0–34.0)
MCHC: 37.1 g/dL — ABNORMAL HIGH (ref 30.0–36.0)
MCV: 87.7 fL (ref 80.0–100.0)
Platelets: 198 10*3/uL (ref 150–400)
RBC: 4.64 MIL/uL (ref 4.22–5.81)
RDW: 14.9 % (ref 11.5–15.5)
WBC: 14.7 10*3/uL — ABNORMAL HIGH (ref 4.0–10.5)
nRBC: 0 % (ref 0.0–0.2)

## 2021-03-01 LAB — HIV ANTIBODY (ROUTINE TESTING W REFLEX): HIV Screen 4th Generation wRfx: NONREACTIVE

## 2021-03-01 MED ORDER — FUROSEMIDE 10 MG/ML IJ SOLN
80.0000 mg | Freq: Two times a day (BID) | INTRAMUSCULAR | Status: DC
  Administered 2021-03-01 – 2021-03-02 (×3): 80 mg via INTRAVENOUS
  Filled 2021-03-01 (×3): qty 8

## 2021-03-01 NOTE — Progress Notes (Signed)
Pt arrived to the floor at this time via stretcher. Alert and oriented x 4. VSS. Pt oriented to room and call light, bed in lowest position.

## 2021-03-01 NOTE — Progress Notes (Signed)
PROGRESS NOTE    Henryk Ursin  AVW:[id number] DOB: 10/02/1960 DOA: 02/28/2021 PCP: [facility]  Outpatient Specialists: [facility]    Brief Narrative:   Mansour Balboa is a 60 y.o. male with a past medical history of nonischemic cardiomyopathy, congestive heart failure with mildly reduced ejection fraction 40 to 45% based off echo on 8/21, coronary artery disease, persistent atrial fibrillation on Eliquis, essential hypertension, tobacco dependence, alcohol dependence, chronic kidney disease stage II/III.  The patient presented to the emergency department due to shortness of breath and orthopnea as well as dyspnea on exertion that started yesterday.  He is mildly tachypneic and hypoxic in the emergency room.  Denies any chest pain.  States he has missed a few doses of his home Lasix over the last few days.  Denies any fevers or chills.  Complaining of left flank pain.  CT renal study was done which was unremarkable for any obstructive uropathy.  He has urinated approximately 600 cc of golden urine in the urinal jug after getting IV Lasix in the emergency department.  States he drinks 1 beer per day.  Denies any history of withdrawals or DTs.  Currently still smoking tobacco about 4 to 5 cigarettes/day.  Admits to a dry cough.  Has 1+ bilateral lower extremity edema.  No nausea, vomiting diarrhea or abdominal pain.  No dysuria or urinary frequency.   Assessment & Plan:   Active Problems:   Acute exacerbation of CHF (congestive heart failure) (HCC)  # Acute hypoxic respiratory failure 2/2 chf exacerbation. Currently on 2 L, none at home - wean as able  # Acute exacerbation of HFrEF EF 30-35% on TTE, down from 40-45 last check. Moderately reduced RV dysfunction with mild pulmonary hypertension. Non-ischemic. Remote cocaine use, former heavy drinker. Exacerbation in setting of being out of diuretic for a few days. Respiratory somewhat improved with diuresis, pt reports urine  output has not been robust. Mild troponin elevation that is flat, no chest pain, do not think ACS. CXR w/ interstitial edema, bnp elevated. - increase lasix to 80 mg IV bid - strict I/os - continue coreg, losartan, imdur, atorva, asa - cardiology consulted by admission provider, will defer initiation of evidence based meds to them  # Atrial fibrillation with RVR RVR resolved - cont home coreg and apixiban  # Left flank pain Resolved. CT non-revelatory - monitor  # Hypertension Elevation on presentation, now improved - continue home meds  # Tobacco dependence - patch  # CAD - asa, statin  # CKD3a GFR 53 which is at baseline - monitor  DVT prophylaxis: apixaban Code Status: full Family Communication: none @ bedside  Level of care: Progressive Cardiac Status is: Inpatient  Remains inpatient appropriate because:Inpatient level of care appropriate due to severity of illness  Dispo: The patient is from: Home              Anticipated d/c is to: Home              Patient currently is not medically stable to d/c.   Difficult to place patient No        Consultants:  cardiology  Procedures: none  Antimicrobials:  none    Subjective: This morning says dyspnea somewhat improved. Flank pain resolved. No chest pain  Objective: Vitals:   03/01/21 0505 03/01/21 0506 03/01/21 0604 03/01/21 0605  BP: 104/87 104/87 (!) 131/111 (!) 131/111  Pulse: 80 86  72  Resp: 16 16  16  Temp:      TempSrc:      SpO2: 97% 98%  95%  Weight:      Height:        Intake/Output Summary (Last 24 hours) at 03/01/2021 0740 Last data filed at 02/28/2021 1300 Gross per 24 hour  Intake --  Output 600 ml  Net -600 ml   Filed Weights   02/28/21 1010  Weight: 95.3 kg    Examination:  General exam: Appears calm and comfortable  Respiratory system: Clear to auscultation. Respiratory effort normal. Cardiovascular system: S1 & S2 heard, RRR. Distant sounds, soft systolic  murmur Gastrointestinal system: Abdomen is nondistended, soft and nontender. No organomegaly or masses felt. Normal bowel sounds heard. Central nervous system: Alert and oriented. No focal neurological deficits. Extremities: Symmetric 5 x 5 power. Pitting edema to knees Skin: No rashes, lesions or ulcers Psychiatry: Judgement and insight appear normal. Mood & affect appropriate.     Data Reviewed: I have personally reviewed following labs and imaging studies  CBC: Recent Labs  Lab 02/28/21 1012 03/01/21 0622  WBC 13.8* 14.7*  HGB 16.8 15.1  HCT 45.4 40.7  MCV 88.2 87.7  PLT 229 198   Basic Metabolic Panel: Recent Labs  Lab 02/28/21 1012 03/01/21 0622  NA 135 134*  K 4.2 4.1  CL 103 100  CO2 22 25  GLUCOSE 104* 103*  BUN 12 18  CREATININE 1.30* 1.49*  CALCIUM 8.8* 8.4*   GFR: Estimated Creatinine Clearance: 59.3 mL/min (A) (by C-G formula based on SCr of 1.49 mg/dL (H)). Liver Function Tests: Recent Labs  Lab 03/01/21 0622  AST 24  ALT 26  ALKPHOS 100  BILITOT 2.5*  PROT 7.1  ALBUMIN 3.5   No results for input(s): LIPASE, AMYLASE in the last 168 hours. No results for input(s): AMMONIA in the last 168 hours. Coagulation Profile: Recent Labs  Lab 02/28/21 1012  INR 1.1   Cardiac Enzymes: No results for input(s): CKTOTAL, CKMB, CKMBINDEX, TROPONINI in the last 168 hours. BNP (last 3 results) No results for input(s): PROBNP in the last 8760 hours. HbA1C: No results for input(s): HGBA1C in the last 72 hours. CBG: No results for input(s): GLUCAP in the last 168 hours. Lipid Profile: No results for input(s): CHOL, HDL, LDLCALC, TRIG, CHOLHDL, LDLDIRECT in the last 72 hours. Thyroid Function Tests: No results for input(s): TSH, T4TOTAL, FREET4, T3FREE, THYROIDAB in the last 72 hours. Anemia Panel: No results for input(s): VITAMINB12, FOLATE, FERRITIN, TIBC, IRON, RETICCTPCT in the last 72 hours. Urine analysis:    Component Value Date/Time   COLORURINE  YELLOW (A) 02/28/2021 1354   APPEARANCEUR CLEAR (A) 02/28/2021 1354   LABSPEC 1.006 02/28/2021 1354   PHURINE 6.0 02/28/2021 1354   GLUCOSEU NEGATIVE 02/28/2021 1354   HGBUR SMALL (A) 02/28/2021 1354   BILIRUBINUR NEGATIVE 02/28/2021 1354   KETONESUR NEGATIVE 02/28/2021 1354   PROTEINUR 100 (A) 02/28/2021 1354   NITRITE NEGATIVE 02/28/2021 1354   LEUKOCYTESUR NEGATIVE 02/28/2021 1354   Sepsis Labs: @LABRCNTIP (procalcitonin:4,lacticidven:4)  ) Recent Results (from the past 240 hour(s))  Resp Panel by RT-PCR (Flu A&B, Covid) Nasopharyngeal Swab     Status: None   Collection Time: 02/28/21 11:35 AM   Specimen: Nasopharyngeal Swab; Nasopharyngeal(NP) swabs in vial transport medium  Result Value Ref Range Status   SARS Coronavirus 2 by RT PCR NEGATIVE NEGATIVE Final    Comment: (NOTE) SARS-CoV-2 target nucleic acids are NOT DETECTED.  The SARS-CoV-2 RNA is generally detectable in upper respiratory specimens  during the acute phase of infection. The lowest concentration of SARS-CoV-2 viral copies this assay can detect is 138 copies/mL. A negative result does not preclude SARS-Cov-2 infection and should not be used as the sole basis for treatment or other patient management decisions. A negative result may occur with  improper specimen collection/handling, submission of specimen other than nasopharyngeal swab, presence of viral mutation(s) within the areas targeted by this assay, and inadequate number of viral copies(<138 copies/mL). A negative result must be combined with clinical observations, patient history, and epidemiological information. The expected result is Negative.  Fact Sheet for Patients:  BloggerCourse.com  Fact Sheet for Healthcare Providers:  SeriousBroker.it  This test is no t yet approved or cleared by the [place] FDA and  has been authorized for detection and/or diagnosis of SARS-CoV-2 by FDA under an  Emergency Use Authorization (EUA). This EUA will remain  in effect (meaning this test can be used) for the duration of the COVID-19 declaration under Section 564(b)(1) of the Act, 21 U.S.C.section 360bbb-3(b)(1), unless the authorization is terminated  or revoked sooner.       Influenza A by PCR NEGATIVE NEGATIVE Final   Influenza B by PCR NEGATIVE NEGATIVE Final    Comment: (NOTE) The Xpert Xpress SARS-CoV-2/FLU/RSV plus assay is intended as an aid in the diagnosis of influenza from Nasopharyngeal swab specimens and should not be used as a sole basis for treatment. Nasal washings and aspirates are unacceptable for Xpert Xpress SARS-CoV-2/FLU/RSV testing.  Fact Sheet for Patients: BloggerCourse.com  Fact Sheet for Healthcare Providers: SeriousBroker.it  This test is not yet approved or cleared by the [place] FDA and has been authorized for detection and/or diagnosis of SARS-CoV-2 by FDA under an Emergency Use Authorization (EUA). This EUA will remain in effect (meaning this test can be used) for the duration of the COVID-19 declaration under Section 564(b)(1) of the Act, 21 U.S.C. section 360bbb-3(b)(1), unless the authorization is terminated or revoked.  Performed at [facility], [address], [place], [place] [address]          Radiology Studies: DG Chest 2 View  Result Date: 02/28/2021 CLINICAL DATA:  Shortness of breath. EXAM: CHEST - 2 VIEW COMPARISON:  07/11/2020 FINDINGS: Heart size upper normal to mildly enlarged. There is pulmonary vascular congestion without overt pulmonary edema. Diffuse interstitial opacity suggests edema. No focal airspace consolidation. Tiny effusions noted on the lateral film. The visualized bony structures of the thorax show no acute abnormality. IMPRESSION: Upper normal heart size to borderline cardiomegaly with diffuse interstitial pulmonary edema pattern. Tiny bilateral  pleural effusions. Electronically Signed   By: Kennith Center M.D.   On: 02/28/2021 12:16   ECHOCARDIOGRAM COMPLETE  Result Date: 02/28/2021    ECHOCARDIOGRAM REPORT   Patient Name:   ROHIT CISNEY Date of Exam: 02/28/2021 Medical Rec #:  [id number]      Height:       69.0 in Accession #:    [id number]     Weight:       210.0 lb Date of Birth:  05/02/1961     BSA:          2.109 m Patient Age:    59 years       BP:           156/127 mmHg Patient Gender: M              HR:           121 bpm. Exam  Location:  [facility] Procedure: 2D Echo, Color Doppler and Cardiac Doppler Indications:     Dyspnea R06.00  History:         Patient has prior history of Echocardiogram examinations, most                  recent 04/05/2020. CAD; Risk Factors:Hypertension. CKD, NICM.  Sonographer:     Cristela Blue RDCS (AE) Referring Phys:  [id number] Verdia Kuba Diagnosing Phys: Julien Nordmann MD IMPRESSIONS  1. Left ventricular ejection fraction, by estimation, is 30 to 35%. The left ventricle has moderately decreased function. The left ventricle demonstrates global hypokinesis. There is moderate left ventricular hypertrophy. Left ventricular diastolic parameters are indeterminate.  2. Right ventricular systolic function is moderately reduced. The right ventricular size is normal. There is mildly elevated pulmonary artery systolic pressure. The estimated right ventricular systolic pressure is 38.4 mmHg.  3. Left atrial size was moderately dilated.  4. Tricuspid valve regurgitation is mild to moderate.  5. Rhythm concerning for atrial fibrillation. FINDINGS  Left Ventricle: Left ventricular ejection fraction, by estimation, is 30 to 35%. The left ventricle has moderately decreased function. The left ventricle demonstrates global hypokinesis. The left ventricular internal cavity size was normal in size. There is moderate left ventricular hypertrophy. Left ventricular diastolic parameters are indeterminate. Right Ventricle: The right ventricular  size is normal. No increase in right ventricular wall thickness. Right ventricular systolic function is moderately reduced. There is mildly elevated pulmonary artery systolic pressure. The tricuspid regurgitant velocity is 2.89 m/s, and with an assumed right atrial pressure of 5 mmHg, the estimated right ventricular systolic pressure is 38.4 mmHg. Left Atrium: Left atrial size was moderately dilated. Right Atrium: Right atrial size was normal in size. Pericardium: There is no evidence of pericardial effusion. Mitral Valve: The mitral valve is normal in structure. Trivial mitral valve regurgitation. No evidence of mitral valve stenosis. Tricuspid Valve: The tricuspid valve is normal in structure. Tricuspid valve regurgitation is mild to moderate. No evidence of tricuspid stenosis. Aortic Valve: The aortic valve is normal in structure. Aortic valve regurgitation is not visualized. No aortic stenosis is present. Aortic valve mean gradient measures 3.0 mmHg. Aortic valve peak gradient measures 4.9 mmHg. Aortic valve area, by VTI measures 3.08 cm. Pulmonic Valve: The pulmonic valve was normal in structure. Pulmonic valve regurgitation is not visualized. No evidence of pulmonic stenosis. Aorta: The aortic root is normal in size and structure. Venous: The inferior vena cava is normal in size with greater than 50% respiratory variability, suggesting right atrial pressure of 3 mmHg. IAS/Shunts: No atrial level shunt detected by color flow Doppler.  LEFT VENTRICLE PLAX 2D LVIDd:         5.50 cm LVIDs:         4.80 cm LV PW:         1.20 cm LV IVS:        1.10 cm LVOT diam:     2.00 cm LV SV:         47 LV SV Index:   22 LVOT Area:     3.14 cm  RIGHT VENTRICLE RV Basal diam:  4.50 cm RV S prime:     10.40 cm/s TAPSE (M-mode): 3.7 cm LEFT ATRIUM            Index       RIGHT ATRIUM           Index LA diam:      4.90 cm  2.32  cm/m  RA Area:     18.70 cm LA Vol (A2C): 112.0 ml 53.10 ml/m RA Volume:   51.30 ml  24.32 ml/m LA  Vol (A4C): 65.1 ml  30.86 ml/m  AORTIC VALVE                   PULMONIC VALVE AV Area (Vmax):    2.07 cm    PV Vmax:        0.55 m/s AV Area (Vmean):   2.21 cm    PV Peak grad:   1.2 mmHg AV Area (VTI):     3.08 cm    RVOT Peak grad: 4 mmHg AV Vmax:           111.00 cm/s AV Vmean:          77.350 cm/s AV VTI:            0.152 m AV Peak Grad:      4.9 mmHg AV Mean Grad:      3.0 mmHg LVOT Vmax:         73.10 cm/s LVOT Vmean:        54.300 cm/s LVOT VTI:          0.149 m LVOT/AV VTI ratio: 0.98  AORTA Ao Root diam: 3.33 cm MITRAL VALVE               TRICUSPID VALVE MV Area (PHT): 7.59 cm    TR Peak grad:   33.4 mmHg MV Decel Time: 100 msec    TR Vmax:        289.00 cm/s MV E velocity: 90.40 cm/s                            SHUNTS                            Systemic VTI:  0.15 m                            Systemic Diam: 2.00 cm Julien Nordmannimothy Gollan MD Electronically signed by Julien Nordmannimothy Gollan MD Signature Date/Time: 02/28/2021/5:15:20 PM    Final    CT Renal Stone Study  Result Date: 02/28/2021 CLINICAL DATA:  Left flank pain.  Kidney stones suspected. EXAM: CT ABDOMEN AND PELVIS WITHOUT CONTRAST TECHNIQUE: Multidetector CT imaging of the abdomen and pelvis was performed following the standard protocol without IV contrast. COMPARISON:  12/04/2019 FINDINGS: Lower chest: Tiny bilateral pleural effusions. Interstitial prominence without features of overt pulmonary edema. Hepatobiliary: Probable mild fatty deposition in the liver. Subtle nodular liver contour raises concern for cirrhosis. Liver measures 17.6 cm craniocaudal length, enlarged. Gallbladder is nondistended. No intrahepatic or extrahepatic biliary dilation. Pancreas: No focal mass lesion. No dilatation of the main duct. No intraparenchymal cyst. No peripancreatic edema. Spleen: No splenomegaly. No focal mass lesion. Adrenals/Urinary Tract: No adrenal nodule or mass. Unremarkable noncontrast appearance of the kidneys. No evidence for hydroureter. The urinary  bladder appears normal for the degree of distention. Right anterior bladder is retracted towards a right groin hernia. Stomach/Bowel: Stomach is nondistended. Duodenum is normally positioned as is the ligament of Treitz. No small bowel wall thickening. No small bowel dilatation. The terminal ileum is normal. The appendix is normal. No gross colonic mass. No colonic wall thickening. Diverticular changes are noted in the left colon without evidence of diverticulitis. Vascular/Lymphatic: There  is moderate atherosclerotic calcification of the abdominal aorta without aneurysm. There is no gastrohepatic or hepatoduodenal ligament lymphadenopathy. No retroperitoneal or mesenteric lymphadenopathy. No pelvic sidewall lymphadenopathy. Reproductive: Prostate gland appears mildly enlarged. Other: No intraperitoneal free fluid. Musculoskeletal: Small umbilical hernia contains only fat. Left groin hernia contains only fat although the anterior left bladder wall is retracted towards the hernia. No worrisome lytic or sclerotic osseous abnormality. Degenerative changes are noted in both hips. IMPRESSION: 1. No acute findings in the abdomen or pelvis. Specifically, no findings to explain the patient's history of left flank pain. 2. Tiny bilateral pleural effusions. 3. Hepatomegaly. Subtle nodularity of liver contour raises concern for cirrhosis. 4. Left groin hernia contains only fat. Anterior left bladder wall is retracted towards the hernia. 5. Aortic Atherosclerosis (ICD10-I70.0). Electronically Signed   By: Kennith Center M.D.   On: 02/28/2021 12:23        Scheduled Meds:  apixaban  5 mg Oral BID   aspirin EC  81 mg Oral Daily   atorvastatin  80 mg Oral Daily   carvedilol  25 mg Oral BID WC   furosemide  40 mg Intravenous BID   hydrALAZINE  50 mg Oral Q8H   isosorbide mononitrate  15 mg Oral Daily   losartan  50 mg Oral Daily   nicotine  14 mg Transdermal Daily   Continuous Infusions:   LOS: 1 day    Time  spent: 40 min    Silvano Bilis, MD [facility]   If 7PM-7AM, please contact night-coverage [email or web address] Password [facility] 03/01/2021, 7:40 AM

## 2021-03-01 NOTE — Consult Note (Signed)
ANTICOAGULATION CONSULT NOTE  Pharmacy Consult for Apixaban Indication: atrial fibrillation  Patient Measurements: Height: 5\' 9"  (175.3 cm) Weight: 95.3 kg (210 lb) IBW/kg (Calculated) : 70.7   Labs: Recent Labs    02/28/21 1012 02/28/21 1232 03/01/21 0622  HGB 16.8  --  15.1  HCT 45.4  --  40.7  PLT 229  --  198  LABPROT 14.6  --   --   INR 1.1  --   --   CREATININE 1.30*  --  1.49*  TROPONINIHS 50* 47*  --     Estimated Creatinine Clearance: 59.3 mL/min (A) (by C-G formula based on SCr of 1.49 mg/dL (H)).   Medical History: Past Medical History:  Diagnosis Date   CHF (congestive heart failure) (HCC)    CKD (chronic kidney disease), stage II    Coronary artery disease, non-occlusive    HFrEF (heart failure with reduced ejection fraction) (HCC)    Hypertension    Hypertensive heart disease    NICM (nonischemic cardiomyopathy) (HCC)    Persistent atrial fibrillation (HCC)     Medications:  Apixaban 5 mg BID prior to admission  Assessment: Patient is a 60 y/o M with medical history as above and including atrial fibrillation on apixaban who is admitted with acute hypoxic respiratory failure and Afib with RVR. Pharmacy consulted for apixaban dosing for Afib.   Plan:  --Continue apixaban 5 mg BID as ordered --CBC at least every 3 days per protocol  77 03/01/2021,7:52 AM

## 2021-03-01 NOTE — Plan of Care (Signed)
BP still 150/114 even after morning BP meds and lasix IV has been given. Dr. Informed for possible additional orders.

## 2021-03-02 LAB — BASIC METABOLIC PANEL
Anion gap: 7 (ref 5–15)
BUN: 21 mg/dL (ref 8–23)
CO2: 29 mmol/L (ref 22–32)
Calcium: 8.5 mg/dL — ABNORMAL LOW (ref 8.9–10.3)
Chloride: 95 mmol/L — ABNORMAL LOW (ref 98–111)
Creatinine, Ser: 1.66 mg/dL — ABNORMAL HIGH (ref 0.61–1.24)
GFR, Estimated: 47 mL/min — ABNORMAL LOW (ref >60)
Glucose, Bld: 114 mg/dL — ABNORMAL HIGH (ref 70–99)
Potassium: 3.5 mmol/L (ref 3.5–5.1)
Sodium: 131 mmol/L — ABNORMAL LOW (ref 135–145)

## 2021-03-02 MED ORDER — LOSARTAN POTASSIUM 50 MG PO TABS: 50.0000 mg | ORAL_TABLET | Freq: Every day | ORAL | Status: DC

## 2021-03-02 MED ORDER — HYDRALAZINE HCL 50 MG PO TABS: 50.0000 mg | ORAL_TABLET | Freq: Three times a day (TID) | ORAL | Status: DC

## 2021-03-02 MED ORDER — CARVEDILOL 25 MG PO TABS: 37.5000 mg | ORAL_TABLET | Freq: Two times a day (BID) | ORAL | Status: DC

## 2021-03-02 MED ORDER — FUROSEMIDE 40 MG PO TABS
40.0000 mg | ORAL_TABLET | Freq: Every day | ORAL | Status: DC
  Administered 2021-03-02 – 2021-03-03 (×2): 40 mg via ORAL
  Filled 2021-03-02 (×2): qty 1

## 2021-03-02 MED ORDER — POTASSIUM CHLORIDE CRYS ER 20 MEQ PO TBCR: 40.0000 meq | EXTENDED_RELEASE_TABLET | Freq: Two times a day (BID) | ORAL | Status: DC

## 2021-03-02 NOTE — Progress Notes (Addendum)
PROGRESS NOTE    Gregory MalkinGary Lee Schoenherr  ZOX:[id number] DOB: 12/07/1960 DOA: 02/28/2021 [facility] Specialists: [facility]    Brief Narrative:   Gregory Howell is a 60 y.o. male with a past medical history of nonischemic cardiomyopathy, congestive heart failure with mildly reduced ejection fraction 40 to 45% based off echo on 8/21, coronary artery disease, persistent atrial fibrillation on Eliquis, essential hypertension, tobacco dependence, alcohol dependence, chronic kidney disease stage II/III.  The patient presented to the emergency department due to shortness of breath and orthopnea as well as dyspnea on exertion that started yesterday.  He is mildly tachypneic and hypoxic in the emergency room.  Denies any chest pain.  States he has missed a few doses of his home Lasix over the last few days.  Denies any fevers or chills.  Complaining of left flank pain.  CT renal study was done which was unremarkable for any obstructive uropathy.  He has urinated approximately 600 cc of golden urine in the urinal jug after getting IV Lasix in the emergency department.  States he drinks 1 beer per day.  Denies any history of withdrawals or DTs.  Currently still smoking tobacco about 4 to 5 cigarettes/day.  Admits to a dry cough.  Has 1+ bilateral lower extremity edema.  No nausea, vomiting diarrhea or abdominal pain.  No dysuria or urinary frequency.   Assessment & Plan:   Active Problems:   Acute exacerbation of CHF (congestive heart failure) (HCC)  # Acute hypoxic respiratory failure Resolved  # Acute exacerbation of HFrEF EF 30-35% on TTE, down from 40-45 last check. Moderately reduced RV dysfunction with mild pulmonary hypertension. Non-ischemic. Remote cocaine use, former heavy drinker. Exacerbation in setting of being out of diuretic for a few days.  Mild troponin elevation that is flat, no chest pain, do not think ACS. CXR w/ interstitial edema, bnp elevated. Much  improved w/ diuresis - resume home oral lasix - strict I/os - continue coreg, losartan, imdur, atorva, asa - deferring institution of additional evidence-based meds given concern about lack of f/u - close f[facility]  # Homeless Unsafe d/c plan today, thinks can arrange safe d/c plan with family tomorrow - plan for d/c tomorrow, TOC consult  # Atrial fibrillation with RVR RVR resolved - cont home coreg and apixiban  # Left flank pain Resolved. CT non-revelatory - monitor  # Hypertension Elevation on presentation, now improved - continue home meds  # Tobacco dependence - patch  # CAD - asa, statin  # CKD3a stable - monitor  DVT prophylaxis: apixaban Code Status: full Family Communication: none @ bedside  Level of care: Progressive Cardiac Status is: Inpatient  Remains inpatient appropriate because:Unsafe d/c plan  Dispo: The patient is from: Home              Anticipated d/c is to: Home              Patient currently is medically stable to d/c.   Difficult to place patient No        Consultants:  cardiology  Procedures: none  Antimicrobials:  none    Subjective: This morning says dyspnea improved. Flank pain resolved. No chest pain  Objective: Vitals:   03/01/21 1948 03/02/21 0354 03/02/21 0505 03/02/21 0808  BP: 113/87 (!) 140/111 (!) 125/97 (!) 139/96  Pulse: 78 64 75 (!) 109  Resp: 18 20  18   Temp: 98.7 F (37.1 C) 97.9 F (  36.6 C)  97.8 F (36.6 C)  TempSrc: Oral Oral  Oral  SpO2: 97% 100%  98%  Weight:  95.2 kg    Height:       No intake or output data in the 24 hours ending 03/02/21 1235  Filed Weights   02/28/21 1010 03/02/21 0354  Weight: 95.3 kg 95.2 kg    Examination:  General exam: Appears calm and comfortable  Respiratory system: Clear to auscultation. Respiratory effort normal. Cardiovascular system: S1 & S2 heard, RRR. Distant sounds, soft systolic murmur Gastrointestinal system: Abdomen is  nondistended, soft and nontender. No organomegaly or masses felt. Normal bowel sounds heard. Central nervous system: Alert and oriented. No focal neurological deficits. Extremities: Symmetric 5 x 5 power. No edema Skin: No rashes, lesions or ulcers Psychiatry: Judgement and insight appear normal. Mood & affect appropriate.     Data Reviewed: I have personally reviewed following labs and imaging studies  CBC: Recent Labs  Lab 02/28/21 1012 03/01/21 0622  WBC 13.8* 14.7*  HGB 16.8 15.1  HCT 45.4 40.7  MCV 88.2 87.7  PLT 229 198   Basic Metabolic Panel: Recent Labs  Lab 02/28/21 1012 03/01/21 0622 03/02/21 0457  NA 135 134* 131*  K 4.2 4.1 3.5  CL 103 100 95*  CO2 22 25 29   GLUCOSE 104* 103* 114*  BUN 12 18 21   CREATININE 1.30* 1.49* 1.66*  CALCIUM 8.8* 8.4* 8.5*   GFR: Estimated Creatinine Clearance: 53.2 mL/min (A) (by C-G formula based on SCr of 1.66 mg/dL (H)). Liver Function Tests: Recent Labs  Lab 03/01/21 0622  AST 24  ALT 26  ALKPHOS 100  BILITOT 2.5*  PROT 7.1  ALBUMIN 3.5   No results for input(s): LIPASE, AMYLASE in the last 168 hours. No results for input(s): AMMONIA in the last 168 hours. Coagulation Profile: Recent Labs  Lab 02/28/21 1012  INR 1.1   Cardiac Enzymes: No results for input(s): CKTOTAL, CKMB, CKMBINDEX, TROPONINI in the last 168 hours. BNP (last 3 results) No results for input(s): PROBNP in the last 8760 hours. HbA1C: No results for input(s): HGBA1C in the last 72 hours. CBG: No results for input(s): GLUCAP in the last 168 hours. Lipid Profile: No results for input(s): CHOL, HDL, LDLCALC, TRIG, CHOLHDL, LDLDIRECT in the last 72 hours. Thyroid Function Tests: No results for input(s): TSH, T4TOTAL, FREET4, T3FREE, THYROIDAB in the last 72 hours. Anemia Panel: No results for input(s): VITAMINB12, FOLATE, FERRITIN, TIBC, IRON, RETICCTPCT in the last 72 hours. Urine analysis:    Component Value Date/Time   COLORURINE YELLOW  (A) 02/28/2021 1354   APPEARANCEUR CLEAR (A) 02/28/2021 1354   LABSPEC 1.006 02/28/2021 1354   PHURINE 6.0 02/28/2021 1354   GLUCOSEU NEGATIVE 02/28/2021 1354   HGBUR SMALL (A) 02/28/2021 1354   BILIRUBINUR NEGATIVE 02/28/2021 1354   KETONESUR NEGATIVE 02/28/2021 1354   PROTEINUR 100 (A) 02/28/2021 1354   NITRITE NEGATIVE 02/28/2021 1354   LEUKOCYTESUR NEGATIVE 02/28/2021 1354   Sepsis Labs: @LABRCNTIP (procalcitonin:4,lacticidven:4)  ) Recent Results (from the past 240 hour(s))  Resp Panel by RT-PCR (Flu A&B, Covid) Nasopharyngeal Swab     Status: None   Collection Time: 02/28/21 11:35 AM   Specimen: Nasopharyngeal Swab; Nasopharyngeal(NP) swabs in vial transport medium  Result Value Ref Range Status   SARS Coronavirus 2 by RT PCR NEGATIVE NEGATIVE Final    Comment: (NOTE) SARS-CoV-2 target nucleic acids are NOT DETECTED.  The SARS-CoV-2 RNA is generally detectable in upper respiratory specimens during the acute phase  of infection. The lowest concentration of SARS-CoV-2 viral copies this assay can detect is 138 copies/mL. A negative result does not preclude SARS-Cov-2 infection and should not be used as the sole basis for treatment or other patient management decisions. A negative result may occur with  improper specimen collection/handling, submission of specimen other than nasopharyngeal swab, presence of viral mutation(s) within the areas targeted by this assay, and inadequate number of viral copies(<138 copies/mL). A negative result must be combined with clinical observations, patient history, and epidemiological information. The expected result is Negative.  Fact Sheet for Patients:  BloggerCourse.com  Fact Sheet for Healthcare Providers:  SeriousBroker.it  This test is no t yet approved or cleared by the [place] FDA and  has been authorized for detection and/or diagnosis of SARS-CoV-2 by FDA under an Emergency  Use Authorization (EUA). This EUA will remain  in effect (meaning this test can be used) for the duration of the COVID-19 declaration under Section 564(b)(1) of the Act, 21 U.S.C.section 360bbb-3(b)(1), unless the authorization is terminated  or revoked sooner.       Influenza A by PCR NEGATIVE NEGATIVE Final   Influenza B by PCR NEGATIVE NEGATIVE Final    Comment: (NOTE) The Xpert Xpress SARS-CoV-2/FLU/RSV plus assay is intended as an aid in the diagnosis of influenza from Nasopharyngeal swab specimens and should not be used as a sole basis for treatment. Nasal washings and aspirates are unacceptable for Xpert Xpress SARS-CoV-2/FLU/RSV testing.  Fact Sheet for Patients: BloggerCourse.com  Fact Sheet for Healthcare Providers: SeriousBroker.it  This test is not yet approved or cleared by the [place] FDA and has been authorized for detection and/or diagnosis of SARS-CoV-2 by FDA under an Emergency Use Authorization (EUA). This EUA will remain in effect (meaning this test can be used) for the duration of the COVID-19 declaration under Section 564(b)(1) of the Act, 21 U.S.C. section 360bbb-3(b)(1), unless the authorization is terminated or revoked.  Performed at [facility], [address] [place], [place] [address]          Radiology Studies: ECHOCARDIOGRAM COMPLETE  Result Date: 02/28/2021    ECHOCARDIOGRAM REPORT   Patient Name:   VASH QUEZADA Date of Exam: 02/28/2021 Medical Rec #:  [id number]      Height:       69.0 in Accession #:    [id number]     Weight:       210.0 lb Date of Birth:  05-19-1961     BSA:          2.109 m Patient Age:    59 years       BP:           156/127 mmHg Patient Gender: M              HR:           121 bpm. Exam Location:  [facility] Procedure: 2D Echo, Color Doppler and Cardiac Doppler Indications:     Dyspnea R06.00  History:         Patient has prior history of Echocardiogram  examinations, most                  recent 04/05/2020. CAD; Risk Factors:Hypertension. CKD, NICM.  Sonographer:     Cristela Blue RDCS (AE) Referring Phys:  [id number] Verdia Kuba Diagnosing Phys: Julien Nordmann MD IMPRESSIONS  1. Left ventricular ejection fraction, by estimation, is 30 to 35%. The left ventricle has moderately decreased function. The left  ventricle demonstrates global hypokinesis. There is moderate left ventricular hypertrophy. Left ventricular diastolic parameters are indeterminate.  2. Right ventricular systolic function is moderately reduced. The right ventricular size is normal. There is mildly elevated pulmonary artery systolic pressure. The estimated right ventricular systolic pressure is 38.4 mmHg.  3. Left atrial size was moderately dilated.  4. Tricuspid valve regurgitation is mild to moderate.  5. Rhythm concerning for atrial fibrillation. FINDINGS  Left Ventricle: Left ventricular ejection fraction, by estimation, is 30 to 35%. The left ventricle has moderately decreased function. The left ventricle demonstrates global hypokinesis. The left ventricular internal cavity size was normal in size. There is moderate left ventricular hypertrophy. Left ventricular diastolic parameters are indeterminate. Right Ventricle: The right ventricular size is normal. No increase in right ventricular wall thickness. Right ventricular systolic function is moderately reduced. There is mildly elevated pulmonary artery systolic pressure. The tricuspid regurgitant velocity is 2.89 m/s, and with an assumed right atrial pressure of 5 mmHg, the estimated right ventricular systolic pressure is 38.4 mmHg. Left Atrium: Left atrial size was moderately dilated. Right Atrium: Right atrial size was normal in size. Pericardium: There is no evidence of pericardial effusion. Mitral Valve: The mitral valve is normal in structure. Trivial mitral valve regurgitation. No evidence of mitral valve stenosis. Tricuspid Valve: The  tricuspid valve is normal in structure. Tricuspid valve regurgitation is mild to moderate. No evidence of tricuspid stenosis. Aortic Valve: The aortic valve is normal in structure. Aortic valve regurgitation is not visualized. No aortic stenosis is present. Aortic valve mean gradient measures 3.0 mmHg. Aortic valve peak gradient measures 4.9 mmHg. Aortic valve area, by VTI measures 3.08 cm. Pulmonic Valve: The pulmonic valve was normal in structure. Pulmonic valve regurgitation is not visualized. No evidence of pulmonic stenosis. Aorta: The aortic root is normal in size and structure. Venous: The inferior vena cava is normal in size with greater than 50% respiratory variability, suggesting right atrial pressure of 3 mmHg. IAS/Shunts: No atrial level shunt detected by color flow Doppler.  LEFT VENTRICLE PLAX 2D LVIDd:         5.50 cm LVIDs:         4.80 cm LV PW:         1.20 cm LV IVS:        1.10 cm LVOT diam:     2.00 cm LV SV:         47 LV SV Index:   22 LVOT Area:     3.14 cm  RIGHT VENTRICLE RV Basal diam:  4.50 cm RV S prime:     10.40 cm/s TAPSE (M-mode): 3.7 cm LEFT ATRIUM            Index       RIGHT ATRIUM           Index LA diam:      4.90 cm  2.32 cm/m  RA Area:     18.70 cm LA Vol (A2C): 112.0 ml 53.10 ml/m RA Volume:   51.30 ml  24.32 ml/m LA Vol (A4C): 65.1 ml  30.86 ml/m  AORTIC VALVE                   PULMONIC VALVE AV Area (Vmax):    2.07 cm    PV Vmax:        0.55 m/s AV Area (Vmean):   2.21 cm    PV Peak grad:   1.2 mmHg AV Area (VTI):     3.08  cm    RVOT Peak grad: 4 mmHg AV Vmax:           111.00 cm/s AV Vmean:          77.350 cm/s AV VTI:            0.152 m AV Peak Grad:      4.9 mmHg AV Mean Grad:      3.0 mmHg LVOT Vmax:         73.10 cm/s LVOT Vmean:        54.300 cm/s LVOT VTI:          0.149 m LVOT/AV VTI ratio: 0.98  AORTA Ao Root diam: 3.33 cm MITRAL VALVE               TRICUSPID VALVE MV Area (PHT): 7.59 cm    TR Peak grad:   33.4 mmHg MV Decel Time: 100 msec    TR Vmax:         289.00 cm/s MV E velocity: 90.40 cm/s                            SHUNTS                            Systemic VTI:  0.15 m                            Systemic Diam: 2.00 cm Julien Nordmann MD Electronically signed by Julien Nordmann MD Signature Date/Time: 02/28/2021/5:15:20 PM    Final         Scheduled Meds:  apixaban  5 mg Oral BID   aspirin EC  81 mg Oral Daily   atorvastatin  80 mg Oral Daily   carvedilol  25 mg Oral BID WC   furosemide  40 mg Oral BID   hydrALAZINE  50 mg Oral Q8H   isosorbide mononitrate  15 mg Oral Daily   losartan  50 mg Oral Daily   nicotine  14 mg Transdermal Daily   potassium chloride SA  40 mEq Oral BID   Continuous Infusions:   LOS: 2 days    Time spent: 25 min    Silvano Bilis, MD [facility]   If 7PM-7AM, please contact night-coverage [email or web address] Password [facility] 03/02/2021, 12:35 PM

## 2021-03-02 NOTE — Plan of Care (Signed)

## 2021-03-03 LAB — BASIC METABOLIC PANEL
Anion gap: 9 (ref 5–15)
BUN: 21 mg/dL (ref 8–23)
CO2: 29 mmol/L (ref 22–32)
Calcium: 8.7 mg/dL — ABNORMAL LOW (ref 8.9–10.3)
Chloride: 98 mmol/L (ref 98–111)
Creatinine, Ser: 1.6 mg/dL — ABNORMAL HIGH (ref 0.61–1.24)
GFR, Estimated: 49 mL/min — ABNORMAL LOW (ref >60)
Glucose, Bld: 91 mg/dL (ref 70–99)
Potassium: 3.8 mmol/L (ref 3.5–5.1)
Sodium: 136 mmol/L (ref 135–145)

## 2021-03-03 MED ORDER — ZOLPIDEM TARTRATE 5 MG PO TABS
5.0000 mg | ORAL_TABLET | Freq: Every evening | ORAL | Status: DC | PRN
  Administered 2021-03-03: 5 mg via ORAL
  Filled 2021-03-03: qty 1

## 2021-03-03 NOTE — Discharge Summary (Signed)
Gregory Howell XHB:[id number] DOB: August 16, 1960 DOA: 02/28/2021  PCP: [facility]  Admit date: 02/28/2021 Discharge date: 03/03/2021  Time spent: 35 minutes  Recommendations for Outpatient Follow-up:  Cardiology (or pcp) f/u 1 week, bmp then     Discharge Diagnoses:  Active Problems:   Acute exacerbation of CHF (congestive heart failure) (HCC)   Discharge Condition: stable  Diet recommendation: heart healthy  Filed Weights   02/28/21 1010 03/02/21 0354  Weight: 95.3 kg 95.2 kg    History of present illness:   Gregory Howell is a 60 y.o. male with a past medical history of nonischemic cardiomyopathy, congestive heart failure with mildly reduced ejection fraction 40 to 45% based off echo on 8/21, coronary artery disease, persistent atrial fibrillation on Eliquis, essential hypertension, tobacco dependence, alcohol dependence, chronic kidney disease stage II/III.  The patient presented to the emergency department due to shortness of breath and orthopnea as well as dyspnea on exertion that started yesterday.  He is mildly tachypneic and hypoxic in the emergency room.  Denies any chest pain.  States he has missed a few doses of his home Lasix over the last few days.  Denies any fevers or chills.  Complaining of left flank pain.  CT renal study was done which was unremarkable for any obstructive uropathy.  He has urinated approximately 600 cc of golden urine in the urinal jug after getting IV Lasix in the emergency department.  States he drinks 1 beer per day.  Denies any history of withdrawals or DTs.  Currently still smoking tobacco about 4 to 5 cigarettes/day.  Admits to a dry cough.  Has 1+ bilateral lower extremity edema.  No nausea, vomiting diarrhea or abdominal pain.  No dysuria or urinary frequency.  Hospital Course:  # Acute hypoxic respiratory failure Resolved w/ treatment of chf   # Acute exacerbation of HFrEF EF 30-35% on TTE, down from 40-45 last check. Moderately  reduced RV dysfunction with mild pulmonary hypertension. Non-ischemic. Remote cocaine use, former heavy drinker. Exacerbation in setting of being out of diuretic for a few days.  Mild troponin elevation that is flat, no chest pain, do not think ACS. CXR w/ interstitial edema, bnp elevated. Much improved w/ diuresis - resume home oral lasix - continue home coreg, losartan, imdur, atorva, asa - deferring institution of additional evidence-based meds given concern about lack of f/u - [facility]   # Atrial fibrillation with RVR RVR resolved - cont home coreg and apixiban   # Left flank pain Resolved. CT non-revelatory    # CKD3a stable  Procedures: none   Consultations: none  Discharge Exam: Vitals:   03/03/21 0451 03/03/21 0811  BP: 126/79 (!) 121/101  Pulse: 63 73  Resp: 18 19  Temp: 97.6 F (36.4 C) 98.1 F (36.7 C)  SpO2: 93% 99%   General exam: Appears calm and comfortable Respiratory system: Clear to auscultation. Respiratory effort normal. Cardiovascular system: S1 & S2 heard, RRR. Distant sounds, soft systolic murmur Gastrointestinal system: Abdomen is nondistended, soft and nontender. No organomegaly or masses felt. Normal bowel sounds heard. Central nervous system: Alert and oriented. No focal neurological deficits. Extremities: Symmetric 5 x 5 power. No edema Skin: No rashes, lesions or ulcers Psychiatry: Judgement and insight appear normal. Mood & affect appropriate.  Discharge Instructions   Discharge Instructions     Amb referral to [facility]   Complete by: As directed    Diet - low sodium heart healthy  Complete by: As directed    Increase activity slowly   Complete by: As directed       Allergies as of 03/03/2021   No Known Allergies      Medication List     STOP taking these medications    potassium chloride SA 20 MEQ tablet Commonly known as: Klor-Con M20       TAKE these medications    apixaban 5 MG Tabs  tablet Commonly known as: ELIQUIS Take 1 tablet (5 mg total) by mouth 2 (two) times daily.   apixaban 5 MG Tabs tablet Commonly known as: ELIQUIS TAKE ONE TABLET BY MOUTH EVERY 12 HOURS CAUTION BLOOD THINNER   aspirin 81 MG EC tablet Take 1 tablet (81 mg total) by mouth daily.   atorvastatin 80 MG tablet Commonly known as: LIPITOR Take 1 tablet (80 mg total) by mouth daily.   carvedilol 25 MG tablet Commonly known as: COREG Take 1.5 tablets (37.5 mg total) by mouth 2 (two) times daily with a meal.   furosemide 40 MG tablet Commonly known as: LASIX Take 1 tablet (40 mg total) by mouth daily. What changed: when to take this   furosemide 40 MG tablet Commonly known as: LASIX TAKE ONE TABLET BY MOUTH EVERY DAY FOR BLOOD PRESSURE AND FOR FLUID CONTROL What changed: Another medication with the same name was changed. Make sure you understand how and when to take each.   hydrALAZINE 50 MG tablet Commonly known as: APRESOLINE Take 1 tablet (50 mg total) by mouth every 8 (eight) hours.   hydrALAZINE 50 MG tablet Commonly known as: APRESOLINE TAKE ONE TABLET BY MOUTH EVERY 8 HOURS FOR BLOOD PRESSURE   isosorbide mononitrate 30 MG 24 hr tablet Commonly known as: IMDUR Take 1 tablet (30 mg total) by mouth daily.   isosorbide mononitrate 30 MG 24 hr tablet Commonly known as: IMDUR TAKE ONE-HALF TABLET BY MOUTH ONCE EVERY DAY FOR HEART (DO NOT TAKE CIALIS, VIAGRA OR LEVITRA WHILE YOU ARE ON THIS MEDICATION).   losartan 50 MG tablet Commonly known as: COZAAR Take 1 tablet (50 mg total) by mouth daily.   losartan 25 MG tablet Commonly known as: COZAAR Take 1 tablet by mouth daily.       No Known Allergies  Follow-up Information     Your cardiologist at the [facility] Follow up in 1 week(s).                   The results of significant diagnostics from this hospitalization (including imaging, microbiology, ancillary and laboratory) are listed below for reference.     Significant Diagnostic Studies: DG Chest 2 View  Result Date: 02/28/2021 CLINICAL DATA:  Shortness of breath. EXAM: CHEST - 2 VIEW COMPARISON:  07/11/2020 FINDINGS: Heart size upper normal to mildly enlarged. There is pulmonary vascular congestion without overt pulmonary edema. Diffuse interstitial opacity suggests edema. No focal airspace consolidation. Tiny effusions noted on the lateral film. The visualized bony structures of the thorax show no acute abnormality. IMPRESSION: Upper normal heart size to borderline cardiomegaly with diffuse interstitial pulmonary edema pattern. Tiny bilateral pleural effusions. Electronically Signed   By: Kennith Center M.D.   On: 02/28/2021 12:16   ECHOCARDIOGRAM COMPLETE  Result Date: 02/28/2021    ECHOCARDIOGRAM REPORT   Patient Name:   Gregory Howell Date of Exam: 02/28/2021 Medical Rec #:  [id number]      Height:       69.0 in Accession #:    [id number]  Weight:       210.0 lb Date of Birth:  06-05-1961     BSA:          2.109 m Patient Age:    59 years       BP:           156/127 mmHg Patient Gender: M              HR:           121 bpm. Exam Location:  [facility] Procedure: 2D Echo, Color Doppler and Cardiac Doppler Indications:     Dyspnea R06.00  History:         Patient has prior history of Echocardiogram examinations, most                  recent 04/05/2020. CAD; Risk Factors:Hypertension. CKD, NICM.  Sonographer:     Cristela BlueJerry Hege RDCS (AE) Referring Phys:  [id number] Verdia KubaSHAYAN S ANWAR Diagnosing Phys: Julien Nordmannimothy Gollan MD IMPRESSIONS  1. Left ventricular ejection fraction, by estimation, is 30 to 35%. The left ventricle has moderately decreased function. The left ventricle demonstrates global hypokinesis. There is moderate left ventricular hypertrophy. Left ventricular diastolic parameters are indeterminate.  2. Right ventricular systolic function is moderately reduced. The right ventricular size is normal. There is mildly elevated pulmonary artery systolic pressure. The  estimated right ventricular systolic pressure is 38.4 mmHg.  3. Left atrial size was moderately dilated.  4. Tricuspid valve regurgitation is mild to moderate.  5. Rhythm concerning for atrial fibrillation. FINDINGS  Left Ventricle: Left ventricular ejection fraction, by estimation, is 30 to 35%. The left ventricle has moderately decreased function. The left ventricle demonstrates global hypokinesis. The left ventricular internal cavity size was normal in size. There is moderate left ventricular hypertrophy. Left ventricular diastolic parameters are indeterminate. Right Ventricle: The right ventricular size is normal. No increase in right ventricular wall thickness. Right ventricular systolic function is moderately reduced. There is mildly elevated pulmonary artery systolic pressure. The tricuspid regurgitant velocity is 2.89 m/s, and with an assumed right atrial pressure of 5 mmHg, the estimated right ventricular systolic pressure is 38.4 mmHg. Left Atrium: Left atrial size was moderately dilated. Right Atrium: Right atrial size was normal in size. Pericardium: There is no evidence of pericardial effusion. Mitral Valve: The mitral valve is normal in structure. Trivial mitral valve regurgitation. No evidence of mitral valve stenosis. Tricuspid Valve: The tricuspid valve is normal in structure. Tricuspid valve regurgitation is mild to moderate. No evidence of tricuspid stenosis. Aortic Valve: The aortic valve is normal in structure. Aortic valve regurgitation is not visualized. No aortic stenosis is present. Aortic valve mean gradient measures 3.0 mmHg. Aortic valve peak gradient measures 4.9 mmHg. Aortic valve area, by VTI measures 3.08 cm. Pulmonic Valve: The pulmonic valve was normal in structure. Pulmonic valve regurgitation is not visualized. No evidence of pulmonic stenosis. Aorta: The aortic root is normal in size and structure. Venous: The inferior vena cava is normal in size with greater than 50%  respiratory variability, suggesting right atrial pressure of 3 mmHg. IAS/Shunts: No atrial level shunt detected by color flow Doppler.  LEFT VENTRICLE PLAX 2D LVIDd:         5.50 cm LVIDs:         4.80 cm LV PW:         1.20 cm LV IVS:        1.10 cm LVOT diam:     2.00 cm LV SV:  47 LV SV Index:   22 LVOT Area:     3.14 cm  RIGHT VENTRICLE RV Basal diam:  4.50 cm RV S prime:     10.40 cm/s TAPSE (M-mode): 3.7 cm LEFT ATRIUM            Index       RIGHT ATRIUM           Index LA diam:      4.90 cm  2.32 cm/m  RA Area:     18.70 cm LA Vol (A2C): 112.0 ml 53.10 ml/m RA Volume:   51.30 ml  24.32 ml/m LA Vol (A4C): 65.1 ml  30.86 ml/m  AORTIC VALVE                   PULMONIC VALVE AV Area (Vmax):    2.07 cm    PV Vmax:        0.55 m/s AV Area (Vmean):   2.21 cm    PV Peak grad:   1.2 mmHg AV Area (VTI):     3.08 cm    RVOT Peak grad: 4 mmHg AV Vmax:           111.00 cm/s AV Vmean:          77.350 cm/s AV VTI:            0.152 m AV Peak Grad:      4.9 mmHg AV Mean Grad:      3.0 mmHg LVOT Vmax:         73.10 cm/s LVOT Vmean:        54.300 cm/s LVOT VTI:          0.149 m LVOT/AV VTI ratio: 0.98  AORTA Ao Root diam: 3.33 cm MITRAL VALVE               TRICUSPID VALVE MV Area (PHT): 7.59 cm    TR Peak grad:   33.4 mmHg MV Decel Time: 100 msec    TR Vmax:        289.00 cm/s MV E velocity: 90.40 cm/s                            SHUNTS                            Systemic VTI:  0.15 m                            Systemic Diam: 2.00 cm Julien Nordmann MD Electronically signed by Julien Nordmann MD Signature Date/Time: 02/28/2021/5:15:20 PM    Final    CT Renal Stone Study  Result Date: 02/28/2021 CLINICAL DATA:  Left flank pain.  Kidney stones suspected. EXAM: CT ABDOMEN AND PELVIS WITHOUT CONTRAST TECHNIQUE: Multidetector CT imaging of the abdomen and pelvis was performed following the standard protocol without IV contrast. COMPARISON:  12/04/2019 FINDINGS: Lower chest: Tiny bilateral pleural effusions.  Interstitial prominence without features of overt pulmonary edema. Hepatobiliary: Probable mild fatty deposition in the liver. Subtle nodular liver contour raises concern for cirrhosis. Liver measures 17.6 cm craniocaudal length, enlarged. Gallbladder is nondistended. No intrahepatic or extrahepatic biliary dilation. Pancreas: No focal mass lesion. No dilatation of the main duct. No intraparenchymal cyst. No peripancreatic edema. Spleen: No splenomegaly. No focal mass lesion. Adrenals/Urinary Tract: No adrenal nodule or mass. Unremarkable noncontrast appearance of  the kidneys. No evidence for hydroureter. The urinary bladder appears normal for the degree of distention. Right anterior bladder is retracted towards a right groin hernia. Stomach/Bowel: Stomach is nondistended. Duodenum is normally positioned as is the ligament of Treitz. No small bowel wall thickening. No small bowel dilatation. The terminal ileum is normal. The appendix is normal. No gross colonic mass. No colonic wall thickening. Diverticular changes are noted in the left colon without evidence of diverticulitis. Vascular/Lymphatic: There is moderate atherosclerotic calcification of the abdominal aorta without aneurysm. There is no gastrohepatic or hepatoduodenal ligament lymphadenopathy. No retroperitoneal or mesenteric lymphadenopathy. No pelvic sidewall lymphadenopathy. Reproductive: Prostate gland appears mildly enlarged. Other: No intraperitoneal free fluid. Musculoskeletal: Small umbilical hernia contains only fat. Left groin hernia contains only fat although the anterior left bladder wall is retracted towards the hernia. No worrisome lytic or sclerotic osseous abnormality. Degenerative changes are noted in both hips. IMPRESSION: 1. No acute findings in the abdomen or pelvis. Specifically, no findings to explain the patient's history of left flank pain. 2. Tiny bilateral pleural effusions. 3. Hepatomegaly. Subtle nodularity of liver contour  raises concern for cirrhosis. 4. Left groin hernia contains only fat. Anterior left bladder wall is retracted towards the hernia. 5. Aortic Atherosclerosis (ICD10-I70.0). Electronically Signed   By: Kennith Center M.D.   On: 02/28/2021 12:23    Microbiology: Recent Results (from the past 240 hour(s))  Resp Panel by RT-PCR (Flu A&B, Covid) Nasopharyngeal Swab     Status: None   Collection Time: 02/28/21 11:35 AM   Specimen: Nasopharyngeal Swab; Nasopharyngeal(NP) swabs in vial transport medium  Result Value Ref Range Status   SARS Coronavirus 2 by RT PCR NEGATIVE NEGATIVE Final    Comment: (NOTE) SARS-CoV-2 target nucleic acids are NOT DETECTED.  The SARS-CoV-2 RNA is generally detectable in upper respiratory specimens during the acute phase of infection. The lowest concentration of SARS-CoV-2 viral copies this assay can detect is 138 copies/mL. A negative result does not preclude SARS-Cov-2 infection and should not be used as the sole basis for treatment or other patient management decisions. A negative result may occur with  improper specimen collection/handling, submission of specimen other than nasopharyngeal swab, presence of viral mutation(s) within the areas targeted by this assay, and inadequate number of viral copies(<138 copies/mL). A negative result must be combined with clinical observations, patient history, and epidemiological information. The expected result is Negative.  Fact Sheet for Patients:  BloggerCourse.com  Fact Sheet for Healthcare Providers:  SeriousBroker.it  This test is no t yet approved or cleared by the [place] FDA and  has been authorized for detection and/or diagnosis of SARS-CoV-2 by FDA under an Emergency Use Authorization (EUA). This EUA will remain  in effect (meaning this test can be used) for the duration of the COVID-19 declaration under Section 564(b)(1) of the Act, 21 U.S.C.section  360bbb-3(b)(1), unless the authorization is terminated  or revoked sooner.       Influenza A by PCR NEGATIVE NEGATIVE Final   Influenza B by PCR NEGATIVE NEGATIVE Final    Comment: (NOTE) The Xpert Xpress SARS-CoV-2/FLU/RSV plus assay is intended as an aid in the diagnosis of influenza from Nasopharyngeal swab specimens and should not be used as a sole basis for treatment. Nasal washings and aspirates are unacceptable for Xpert Xpress SARS-CoV-2/FLU/RSV testing.  Fact Sheet for Patients: BloggerCourse.com  Fact Sheet for Healthcare Providers: SeriousBroker.it  This test is not yet approved or cleared by the [place] and has been authorized  for detection and/or diagnosis of SARS-CoV-2 by FDA under an Emergency Use Authorization (EUA). This EUA will remain in effect (meaning this test can be used) for the duration of the COVID-19 declaration under Section 564(b)(1) of the Act, 21 U.S.C. section 360bbb-3(b)(1), unless the authorization is terminated or revoked.  Performed at [facility], [address] [place], [place] [address]      Labs: Basic Metabolic Panel: Recent Labs  Lab 02/28/21 1012 03/01/21 0622 03/02/21 0457 03/03/21 0529  NA 135 134* 131* 136  K 4.2 4.1 3.5 3.8  CL 103 100 95* 98  CO2 22 25 29 29   GLUCOSE 104* 103* 114* 91  BUN 12 18 21 21   CREATININE 1.30* 1.49* 1.66* 1.60*  CALCIUM 8.8* 8.4* 8.5* 8.7*   Liver Function Tests: Recent Labs  Lab 03/01/21 0622  AST 24  ALT 26  ALKPHOS 100  BILITOT 2.5*  PROT 7.1  ALBUMIN 3.5   No results for input(s): LIPASE, AMYLASE in the last 168 hours. No results for input(s): AMMONIA in the last 168 hours. CBC: Recent Labs  Lab 02/28/21 1012 03/01/21 0622  WBC 13.8* 14.7*  HGB 16.8 15.1  HCT 45.4 40.7  MCV 88.2 87.7  PLT 229 198   Cardiac Enzymes: No results for input(s): CKTOTAL, CKMB, CKMBINDEX, TROPONINI in the last 168  hours. BNP: BNP (last 3 results) Recent Labs    06/19/20 1640 07/11/20 1505 02/28/21 1013  BNP 1,063.1* 1,217.1* 908.5*    ProBNP (last 3 results) No results for input(s): PROBNP in the last 8760 hours.  CBG: No results for input(s): GLUCAP in the last 168 hours.     Signed:  Silvano Bilis MD.  [facility] 03/03/2021, 10:01 AM

## 2021-03-03 NOTE — Consult Note (Signed)
   Heart Failure Nurse Navigator Note  HFmrEF 30-35%.  Previously reported at 34 to 45%  He presented to the emergency room with complaints of shortness of breath, shortness of breath on exertion and orthopnea   Comorbidity:  Coronary artery disease Persistent atrial fibrillation Essential hypertension Tobacco abuse Alcohol abuse previous Chronic kidney disease stage II-III  Labs:  Sodium 136, potassium 3.8, chloride 98, CO2 29, BUN 21, creatinine 1.6 Intake not documented Output not documented Weight not completed  Medications:  Eliquis 5 mg twice a day Aspirin 81 mg daily Atorvastatin 80 mg daily Carvedilol 25 mg 2 times a day Furosemide 40 mg daily Hydralazine 50 mg every 8 hours Isosorbide mononitrate 18m daily Losartan 50 mg daily   Assessment:  General-he is awake and alert sitting up in the chair at bedside in no acute distress.  HEENT-pupils are equal, poor dentition, no JVD Cardiac heart tones are irregular  Chest-breath sounds are clear to posterior auscultation  Abdomen-rounded soft nontender  Musculoskeletal-there is no lower extremity edema  Psych-is pleasant and appropriate makes good eye contact.   Met with patient today.  [place] the problem that led to him not taking his medications for couple days was he had left his medications in his bosses truck.  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  He still does not have enough money for living.  [place] does state [place] 89which [place] later this October.  Discussed his follow-up appointments.  He has appointment with TDarylene Priceon August 5 at 11 AM.  He is also asking questions about applying for disability and Social Security/Medicare [facility] and he needs to be talking with his PCP.  JPricilla RiffleRN CHFN

## 2021-03-14 ENCOUNTER — Ambulatory Visit: Payer: No Typology Code available for payment source | Admitting: Family

## 2021-03-14 ENCOUNTER — Telehealth: Payer: Self-pay | Admitting: Family

## 2021-03-14 NOTE — Telephone Encounter (Signed)
Patient did not show for his Heart Failure Clinic appointment on 03/14/21. Will attempt to reschedule.

## 2021-03-14 NOTE — Progress Notes (Deleted)
Patient ID: Gregory Howell, male    DOB: Sep 15, 1960, 60 y.o.   MRN: [id number]    Mr Burtch is a 60 y/o male with a history of CAD, HTN, CKD, persistent atrial fibrillation, current tobacco use and chronic heart failure.   Echo report from 02/28/21 reviewed and showed an EF of 30-35% along with moderate LVH and mild/moderate TR. Echo report from 04/05/20 reviewed and showed an EF of 40-45% along with moderate LVH, severe LAE, severe RAE and mildly elevated PA pressure.   RHC/LHC done 12/07/19 showed: 2nd Mrg lesion is 100% stenosed. Dist Cx lesion is 60% stenosed. 1st Mrg lesion is 40% stenosed. Ost RCA lesion is 50% stenosed. RPAV lesion is 60% stenosed. Mid RCA lesion is 30% stenosed. LV end diastolic pressure is mildly elevated. There is severe left ventricular systolic dysfunction. The left ventricular ejection fraction is less than 25% by visual estimate. Hemodynamic findings consistent with mild pulmonary hypertension.  Admitted 02/28/21 due to              He presents today for a follow-up visit with a chief complaint of   Past Medical History:  Diagnosis Date   CHF (congestive heart failure) (HCC)    CKD (chronic kidney disease), stage II    Coronary artery disease, non-occlusive    HFrEF (heart failure with reduced ejection fraction) (HCC)    Hypertension    Hypertensive heart disease    NICM (nonischemic cardiomyopathy) (HCC)    Persistent atrial fibrillation (HCC)    Past Surgical History:  Procedure Laterality Date   RIGHT/LEFT HEART CATH AND CORONARY ANGIOGRAPHY N/A 12/07/2019   Procedure: RIGHT/LEFT HEART CATH AND CORONARY ANGIOGRAPHY;  Surgeon: Antonieta Iba, MD;  Location: [facility] INVASIVE CV LAB;  Service: Cardiovascular;  Laterality: N/A;   Family History  Problem Relation Age of Onset   Hypertension Mother    Hypertension Father    CAD Brother    Diabetes Brother    Social History   Tobacco Use   Smoking status: Some Days    Packs/day: 0.50     Types: Cigarettes   Smokeless tobacco: Never  Substance Use Topics   Alcohol use: Yes    Alcohol/week: 7.0 - 14.0 standard drinks    Types: 7 - 14 Cans of beer per week    Comment: daily   No Known Allergies   Review of Systems  Constitutional:  Positive for fatigue (tire easily). Negative for appetite change.  HENT:  Negative for congestion, postnasal drip and sore throat.   Eyes: Negative.   Respiratory:  Positive for shortness of breath. Negative for cough and chest tightness.   Cardiovascular:  Positive for palpitations. Negative for chest pain and leg swelling.  Gastrointestinal:  Negative for abdominal distention and abdominal pain.  Endocrine: Negative.   Genitourinary: Negative.   Musculoskeletal:  Positive for back pain. Negative for neck pain.  Skin: Negative.   Allergic/Immunologic: Negative.   Neurological:  Negative for dizziness and light-headedness.  Hematological:  Negative for adenopathy. Does not bruise/bleed easily.  Psychiatric/Behavioral:  Positive for sleep disturbance (sleeping on no pillows). Negative for dysphoric mood. The patient is not nervous/anxious.      Physical Exam Vitals and nursing note reviewed.  Constitutional:      Appearance: Normal appearance.  HENT:     Head: Normocephalic and atraumatic.  Cardiovascular:     Rate and Rhythm: Normal rate. Rhythm irregular.  Pulmonary:     Effort: Pulmonary effort is normal. No respiratory  distress.     Breath sounds: No wheezing or rales.  Abdominal:     General: There is no distension.     Palpations: Abdomen is soft.     Tenderness: There is no abdominal tenderness.  Musculoskeletal:        General: No tenderness.     Cervical back: Normal range of motion and neck supple.     Right lower leg: No edema.     Left lower leg: No edema.  Skin:    General: Skin is warm and dry.  Neurological:     General: No focal deficit present.     Mental Status: He is alert and oriented to person, place,  and time.  Psychiatric:        Mood and Affect: Mood is anxious.   Assessment & Plan:  1: Chronic heart failure with reduced ejection fraction- - NYHA class III - euvolemic today - weighing daily and says that his weight has been stable; reminded to call for an overnight weight gain of > 2 pounds or a weekly weight gain of > 5 pounds - weight 215.2 from last visit here 9 months ago - not adding salt and has been trying to read food labels for sodium content; - on GDMT of  - consider changing his losartan to entresto in the future/ currently getting RX through the [place]; consider also adding farxiga and or spironolactone - saw cardiology Michaelle Birks) 08/23/20 - BNP 02/28/21 was 908.5   2: HTN- - BP  - sees PCP at the [place] and returns 08/13/20 - BMP 03/03/21 reviewed and showed sodium 136, potassium 3.8, creatinine 1.6 and GFR 49  3: Atrial fibrillation- - currently on apixaban BID - notices palpitations worsen when he's under stressful marital situation - smoking 2-3 cigarettes daily; complete cessation encouraged   Patient did not bring his medications nor a list. Each medication was verbally reviewed with the patient and he was encouraged to bring the bottles to every visit to confirm accuracy of list.

## 2021-05-17 IMAGING — CT CT ABD-PELV W/ CM
2 of 5 series · 15 of 46 positions shown, 17 images · IV contrast (APPLIED)
Comparison: None.

CLINICAL DATA: Abdominal pain and shortness of breath

EXAM:
CT ANGIOGRAPHY CHEST WITH CONTRAST
TECHNIQUE: Multidetector CT imaging of the chest was performed using the
standard protocol during bolus administration of intravenous
contrast. Multiplanar CT image reconstructions and MIPs were
obtained to evaluate the vascular anatomy.
CONTRAST:  100mL OMNIPAQUE IOHEXOL 350 MG/ML SOLN

[Series 4: axial st · axial · 0.85mm/px · z∈[-1033,-608]mm · 12 of 97 slices shown, 14 images]
[im 6/97  soft-tissue]
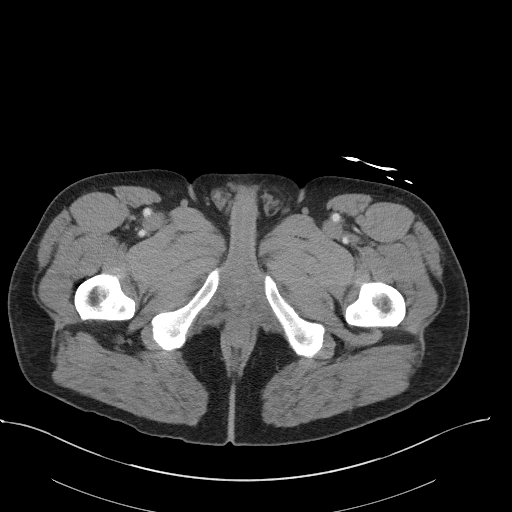
[im 6/97  bone]
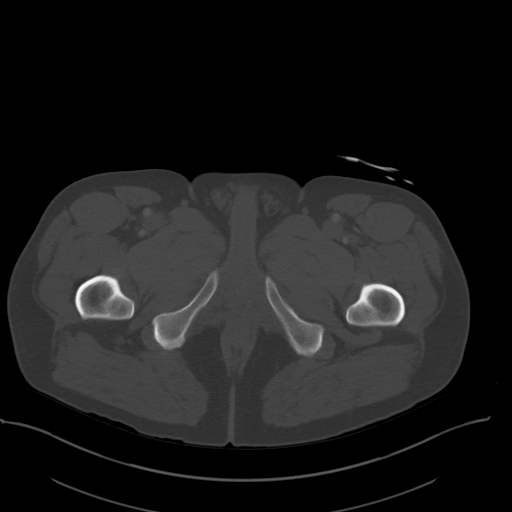
[im 17/97  soft-tissue]
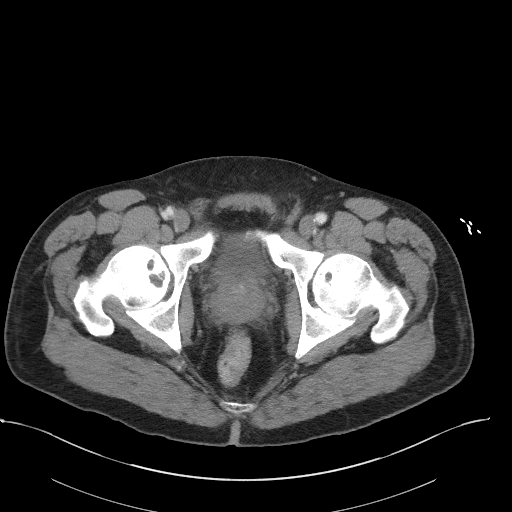
[im 22/97  soft-tissue]
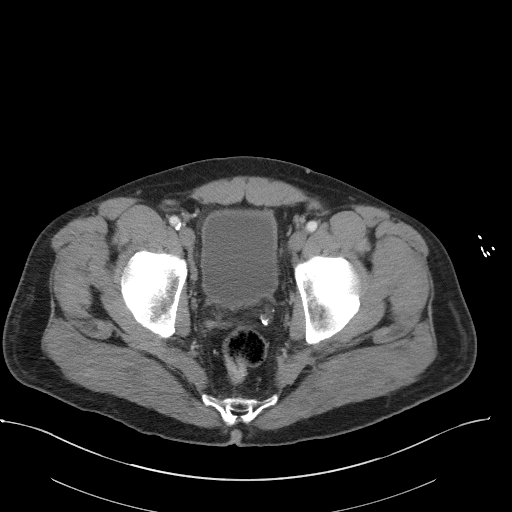
[im 27/97  soft-tissue]
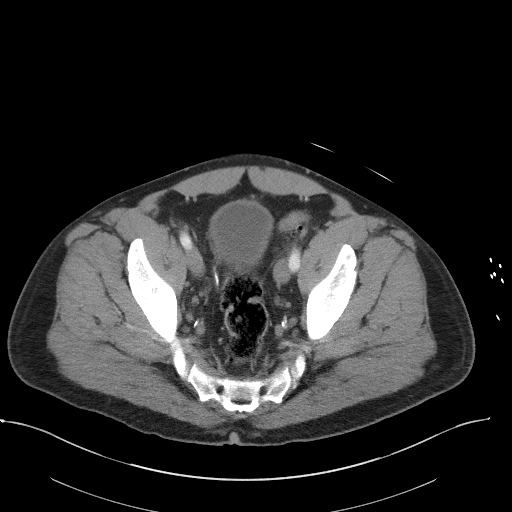
[im 38/97  soft-tissue]
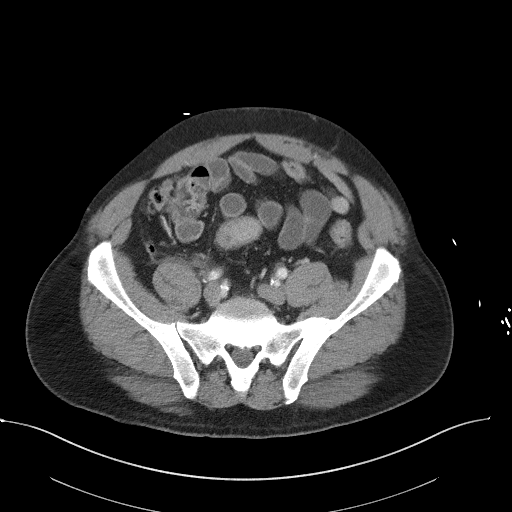
[im 43/97  soft-tissue]
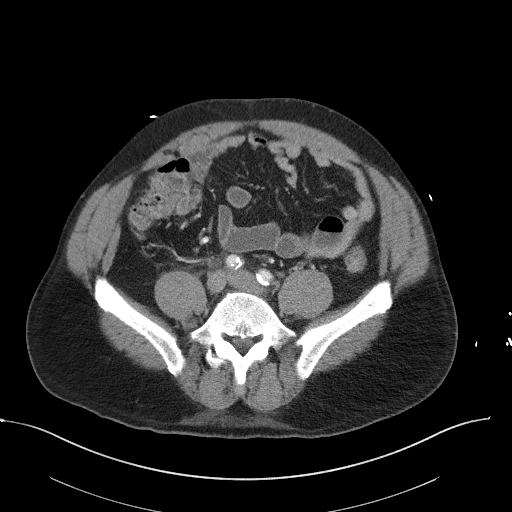
[im 54/97  soft-tissue]
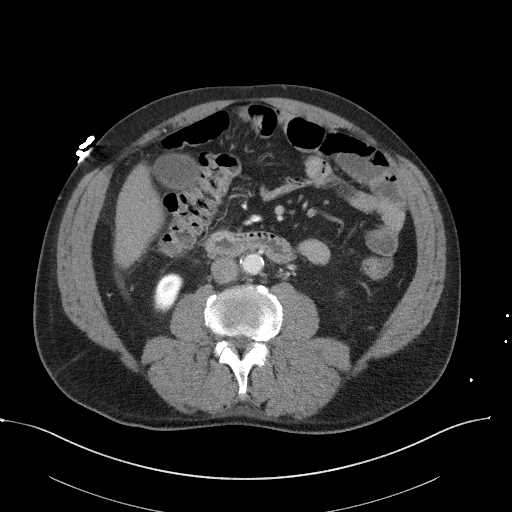
[im 59/97  soft-tissue]
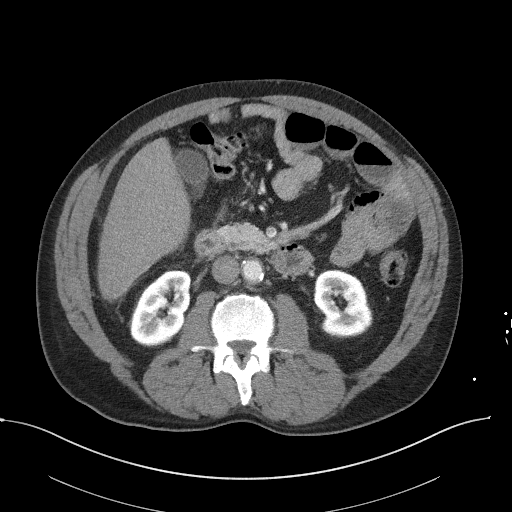
[im 70/97  soft-tissue]
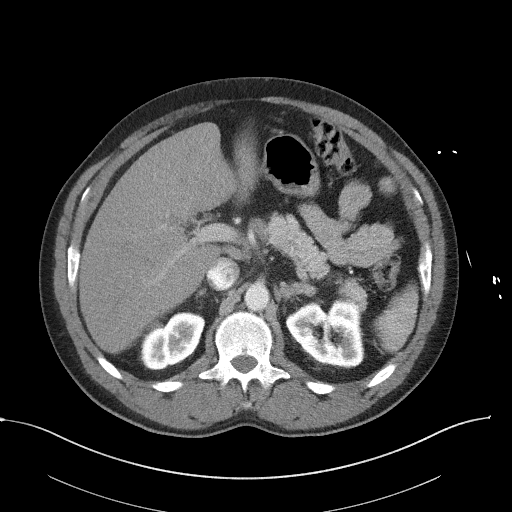
[im 70/97  bone]
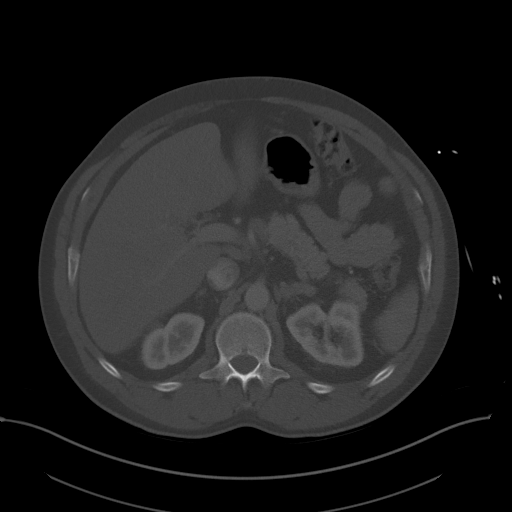
[im 75/97  soft-tissue]
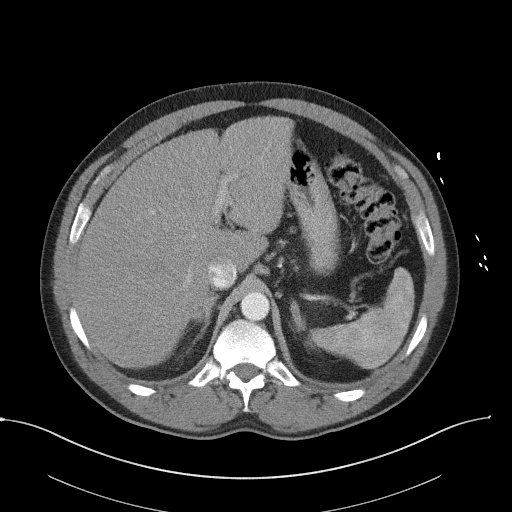
[im 81/97  soft-tissue]
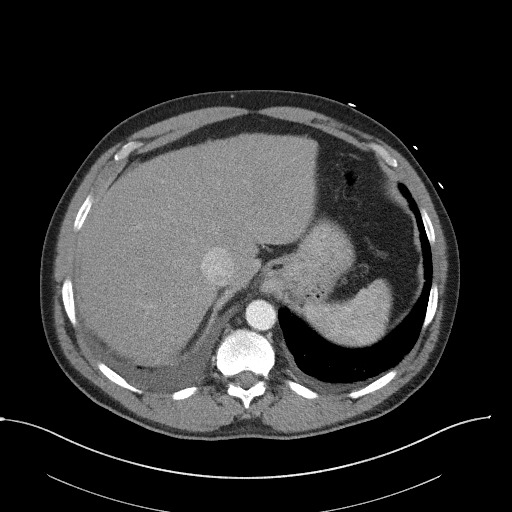
[im 91/97  soft-tissue]
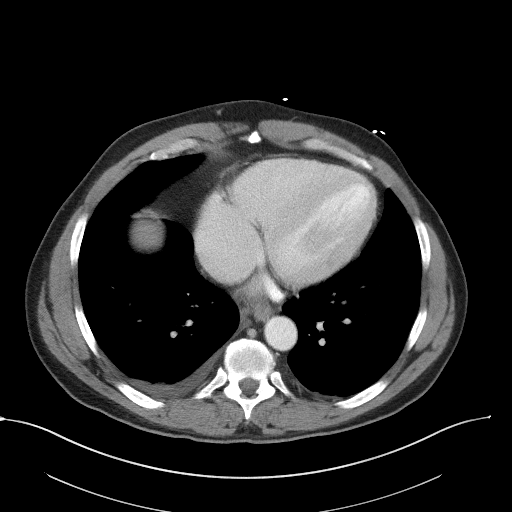

[Series 7: coronal st · coronal · 0.77mm/px · 3 of 98 slices shown]
[im 33/98  soft-tissue]
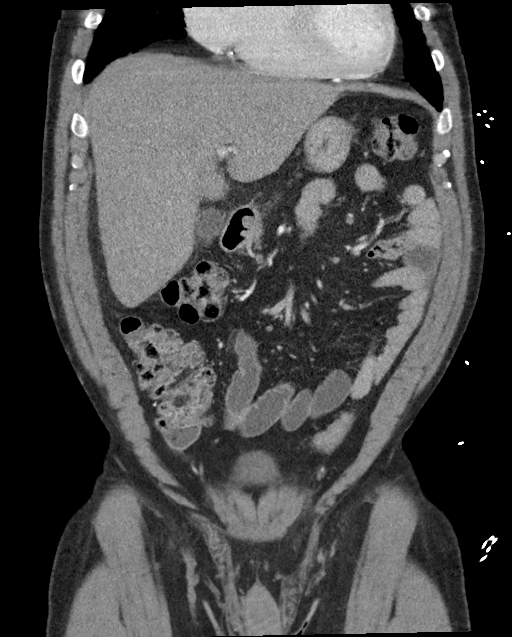
[im 44/98  soft-tissue]
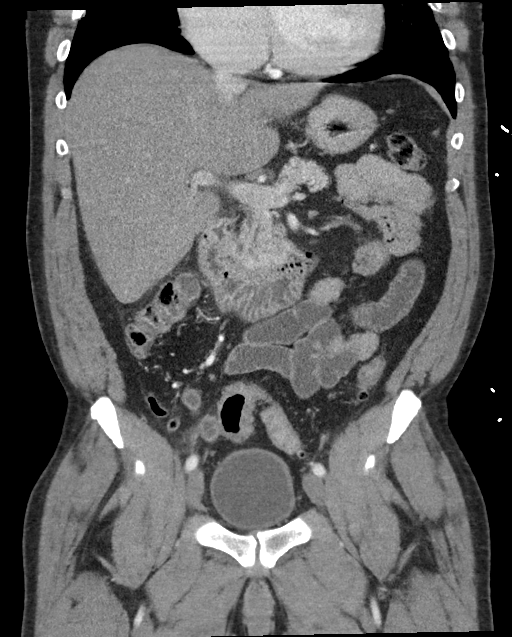
[im 54/98  soft-tissue]
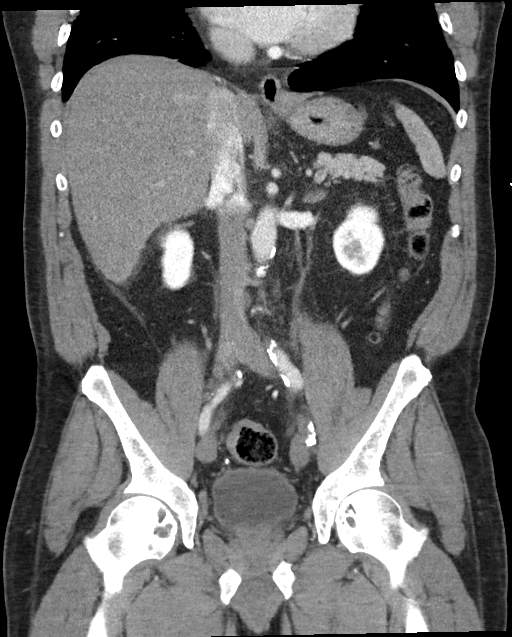

[15 of 46 positions shown; findings below may reference images not displayed]

FINDINGS: Cardiovascular: There is a optimal opacification of the pulmonary
arteries. There is no central,segmental, or subsegmental filling
defects within the pulmonary arteries. There is mild cardiomegaly.
Trace pericardial fusion is seen. Coronary artery calcifications are
seen. No pericardial effusion or thickening. No evidence right heart
strain. There is normal three-vessel brachiocephalic anatomy without
proximal stenosis. Scattered mild aortic atherosclerosis is seen.

Mediastinum/Nodes: No hilar, mediastinal, or axillary adenopathy.
Scattered small prevascular and pretracheal lymph nodes are present.
Thyroid gland, trachea, and esophagus demonstrate no significant
findings.

Lungs/Pleura: Small bilateral pleural effusions are present, right
greater than. Mild hazy ground-glass opacity seen throughout both
lungs. No large airspace consolidation.

Musculoskeletal: No chest wall abnormality. No acute or significant
osseous findings.

Review of the MIP images confirms the above findings.

Abdomen/pelvis:

Hepatobiliary: The liver is normal in density without focal
abnormality.The main portal vein is patent. There appears to be a
small amount of pericholecystic fluid with mild hyperenhancement of
the gallbladder wall. No calcified gallstones are present. No
intrahepatic biliary ductal dilatation. There are fat stranding
changes seen around the gallbladder fundus. A small amount
perihepatic fat stranding changes are also noted which track into
the right pericolic gutter.

Pancreas: Unremarkable. No pancreatic ductal dilatation or
surrounding inflammatory changes.

Spleen: Normal in size without focal abnormality.

Adrenals/Urinary Tract: Both adrenal glands appear normal. The
kidneys and collecting system appear normal without evidence of
urinary tract calculus or hydronephrosis. Bladder is unremarkable.

Stomach/Bowel: The stomach, small bowel, and colon are normal in
appearance. No inflammatory changes, wall thickening, or obstructive
findings.The appendix is normal.

Vascular/Lymphatic: There are no enlarged mesenteric,
retroperitoneal, or pelvic lymph nodes. Scattered aortic
atherosclerotic calcifications are seen without aneurysmal
dilatation.

Reproductive: The prostate is unremarkable.

Other: No evidence of abdominal wall mass or hernia.

Musculoskeletal: No acute or significant osseous findings. Large
subchondral cystic changes are seen at the medial aspect of the
bilateral femoral heads. Ankylosis seen at the left superior
sacroiliac joint.
IMPRESSION: 1. No central, segmental, or subsegmental pulmonary embolism.
2. Small bilateral pleural effusions, right greater than left.
3. Mild cardiomegaly.
4. Trace pericardial effusion
5. Small amount of pericholecystic fluid with surrounding fat
stranding changes which could be due to acalculous cholecystitis.
6. Mild amount of inflammatory stranding changes tracking into the
right pericolic gutter.
7.  Aortic Atherosclerosis ([id number]).

## 2021-05-22 ENCOUNTER — Other Ambulatory Visit: Payer: Self-pay

## 2021-05-22 ENCOUNTER — Emergency Department: Payer: No Typology Code available for payment source

## 2021-05-22 DIAGNOSIS — I5043 Acute on chronic combined systolic (congestive) and diastolic (congestive) heart failure: Secondary | ICD-10-CM | POA: Diagnosis not present

## 2021-05-22 DIAGNOSIS — I4819 Other persistent atrial fibrillation: Secondary | ICD-10-CM | POA: Diagnosis not present

## 2021-05-22 DIAGNOSIS — R0602 Shortness of breath: Secondary | ICD-10-CM | POA: Diagnosis present

## 2021-05-22 DIAGNOSIS — I2510 Atherosclerotic heart disease of native coronary artery without angina pectoris: Secondary | ICD-10-CM | POA: Insufficient documentation

## 2021-05-22 DIAGNOSIS — N183 Chronic kidney disease, stage 3 unspecified: Secondary | ICD-10-CM | POA: Diagnosis not present

## 2021-05-22 DIAGNOSIS — I130 Hypertensive heart and chronic kidney disease with heart failure and stage 1 through stage 4 chronic kidney disease, or unspecified chronic kidney disease: Secondary | ICD-10-CM | POA: Diagnosis not present

## 2021-05-22 DIAGNOSIS — F17210 Nicotine dependence, cigarettes, uncomplicated: Secondary | ICD-10-CM | POA: Diagnosis not present

## 2021-05-22 DIAGNOSIS — Z79899 Other long term (current) drug therapy: Secondary | ICD-10-CM | POA: Insufficient documentation

## 2021-05-22 DIAGNOSIS — M791 Myalgia, unspecified site: Secondary | ICD-10-CM | POA: Diagnosis not present

## 2021-05-22 DIAGNOSIS — Z7901 Long term (current) use of anticoagulants: Secondary | ICD-10-CM | POA: Diagnosis not present

## 2021-05-22 DIAGNOSIS — Z7982 Long term (current) use of aspirin: Secondary | ICD-10-CM | POA: Diagnosis not present

## 2021-05-22 LAB — BASIC METABOLIC PANEL
Anion gap: 9 (ref 5–15)
BUN: 14 mg/dL (ref 8–23)
CO2: 25 mmol/L (ref 22–32)
Calcium: 8.5 mg/dL — ABNORMAL LOW (ref 8.9–10.3)
Chloride: 101 mmol/L (ref 98–111)
Creatinine, Ser: 1.5 mg/dL — ABNORMAL HIGH (ref 0.61–1.24)
GFR, Estimated: 53 mL/min — ABNORMAL LOW (ref >60)
Glucose, Bld: 101 mg/dL — ABNORMAL HIGH (ref 70–99)
Potassium: 4.3 mmol/L (ref 3.5–5.1)
Sodium: 135 mmol/L (ref 135–145)

## 2021-05-22 LAB — CBC WITH DIFFERENTIAL/PLATELET
Abs Immature Granulocytes: 0.05 10*3/uL (ref 0.00–0.07)
Basophils Absolute: 0.1 10*3/uL (ref 0.0–0.1)
Basophils Relative: 1 %
Eosinophils Absolute: 0.1 10*3/uL (ref 0.0–0.5)
Eosinophils Relative: 1 %
HCT: 41.9 % (ref 39.0–52.0)
Hemoglobin: 15.4 g/dL (ref 13.0–17.0)
Immature Granulocytes: 0 %
Lymphocytes Relative: 20 %
Lymphs Abs: 2.3 10*3/uL (ref 0.7–4.0)
MCH: 31.8 pg (ref 26.0–34.0)
MCHC: 36.8 g/dL — ABNORMAL HIGH (ref 30.0–36.0)
MCV: 86.6 fL (ref 80.0–100.0)
Monocytes Absolute: 0.7 10*3/uL (ref 0.1–1.0)
Monocytes Relative: 6 %
Neutro Abs: 8 10*3/uL — ABNORMAL HIGH (ref 1.7–7.7)
Neutrophils Relative %: 72 %
Platelets: 209 10*3/uL (ref 150–400)
RBC: 4.84 MIL/uL (ref 4.22–5.81)
RDW: 15.8 % — ABNORMAL HIGH (ref 11.5–15.5)
WBC: 11.2 10*3/uL — ABNORMAL HIGH (ref 4.0–10.5)
nRBC: 0 % (ref 0.0–0.2)

## 2021-05-22 LAB — BRAIN NATRIURETIC PEPTIDE: B Natriuretic Peptide: 660.4 pg/mL — ABNORMAL HIGH (ref 0.0–100.0)

## 2021-05-22 LAB — TROPONIN I (HIGH SENSITIVITY): Troponin I (High Sensitivity): 31 ng/L — ABNORMAL HIGH (ref <18)

## 2021-05-22 NOTE — ED Triage Notes (Signed)
Pt states that for the past few days hes had an inc sob, pt states worse when he ambulates and when he is laying down,. Pt states he has been taking his lasix regularly. Pt states intermittent chest pain.

## 2021-05-23 ENCOUNTER — Emergency Department
Admission: EM | Admit: 2021-05-23 | Discharge: 2021-05-23 | Disposition: A | Discharge status: Home / Self Care | Payer: No Typology Code available for payment source | Attending: Emergency Medicine | Admitting: Emergency Medicine

## 2021-05-23 ENCOUNTER — Encounter: Payer: Self-pay | Admitting: Emergency Medicine

## 2021-05-23 ENCOUNTER — Other Ambulatory Visit: Payer: Self-pay

## 2021-05-23 DIAGNOSIS — I4891 Unspecified atrial fibrillation: Secondary | ICD-10-CM | POA: Insufficient documentation

## 2021-05-23 DIAGNOSIS — Z79899 Other long term (current) drug therapy: Secondary | ICD-10-CM | POA: Insufficient documentation

## 2021-05-23 DIAGNOSIS — I509 Heart failure, unspecified: Secondary | ICD-10-CM

## 2021-05-23 DIAGNOSIS — Z7901 Long term (current) use of anticoagulants: Secondary | ICD-10-CM | POA: Diagnosis not present

## 2021-05-23 DIAGNOSIS — N183 Chronic kidney disease, stage 3 unspecified: Secondary | ICD-10-CM | POA: Insufficient documentation

## 2021-05-23 DIAGNOSIS — F17210 Nicotine dependence, cigarettes, uncomplicated: Secondary | ICD-10-CM | POA: Insufficient documentation

## 2021-05-23 DIAGNOSIS — R252 Cramp and spasm: Secondary | ICD-10-CM

## 2021-05-23 DIAGNOSIS — I130 Hypertensive heart and chronic kidney disease with heart failure and stage 1 through stage 4 chronic kidney disease, or unspecified chronic kidney disease: Secondary | ICD-10-CM | POA: Diagnosis not present

## 2021-05-23 DIAGNOSIS — I2510 Atherosclerotic heart disease of native coronary artery without angina pectoris: Secondary | ICD-10-CM | POA: Insufficient documentation

## 2021-05-23 DIAGNOSIS — Z7982 Long term (current) use of aspirin: Secondary | ICD-10-CM | POA: Insufficient documentation

## 2021-05-23 DIAGNOSIS — M791 Myalgia, unspecified site: Secondary | ICD-10-CM | POA: Insufficient documentation

## 2021-05-23 DIAGNOSIS — R0600 Dyspnea, unspecified: Secondary | ICD-10-CM

## 2021-05-23 DIAGNOSIS — I5043 Acute on chronic combined systolic (congestive) and diastolic (congestive) heart failure: Secondary | ICD-10-CM | POA: Insufficient documentation

## 2021-05-23 LAB — TROPONIN I (HIGH SENSITIVITY): Troponin I (High Sensitivity): 31 ng/L — ABNORMAL HIGH (ref <18)

## 2021-05-23 LAB — POTASSIUM: Potassium: 3.8 mmol/L (ref 3.5–5.1)

## 2021-05-23 LAB — MAGNESIUM: Magnesium: 2.1 mg/dL (ref 1.7–2.4)

## 2021-05-23 MED ORDER — LORAZEPAM 1 MG PO TABS
1.0000 mg | ORAL_TABLET | Freq: Once | ORAL | Status: AC
  Administered 2021-05-23: 1 mg via ORAL
  Filled 2021-05-23: qty 1

## 2021-05-23 MED ORDER — FUROSEMIDE 10 MG/ML IJ SOLN
40.0000 mg | Freq: Once | INTRAMUSCULAR | Status: AC
  Administered 2021-05-23: 40 mg via INTRAVENOUS
  Filled 2021-05-23: qty 4

## 2021-05-23 NOTE — ED Provider Notes (Signed)
[facility] Emergency Department Provider Note   ____________________________________________   Event Date/Time   First MD Initiated Contact with Patient 05/23/21 0601     (approximate)  I have reviewed the triage vital signs and the nursing notes.   HISTORY  Chief Complaint Generalized Body Aches    HPI Gregory Howell is a 60 y.o. male just discharged from the ED after receiving IV Lasix for CHF exacerbation who checked back in due to generalized muscle cramps.  Patient is homeless and was sitting in the lobby when he developed muscle cramps.  Thinks he may be on too much Lasix that was prescribed to him over 6 months ago from the [place].  Denies chest pain, shortness of breath, abdominal pain, nausea, vomiting or dizziness.      Past Medical History:  Diagnosis Date   CHF (congestive heart failure) (HCC)    CKD (chronic kidney disease), stage II    Coronary artery disease, non-occlusive    HFrEF (heart failure with reduced ejection fraction) (HCC)    Hypertension    Hypertensive heart disease    NICM (nonischemic cardiomyopathy) (HCC)    Persistent atrial fibrillation (HCC)     Patient Active Problem List   Diagnosis Date Noted   Benign essential hypertension 03/01/2021   Cocaine dependence in remission (HCC) 03/01/2021   Homeless 03/01/2021   Acute exacerbation of CHF (congestive heart failure) (HCC) 02/28/2021   Volume overload state of heart 07/12/2020   Acute on chronic combined systolic (congestive) and diastolic (congestive) heart failure (HCC) 07/11/2020   Persistent atrial fibrillation (HCC)    NICM (nonischemic cardiomyopathy) (HCC)    Non compliance w medication regimen    Acute on chronic HFrEF (heart failure with reduced ejection fraction) (HCC) 06/19/2020   CKD (chronic kidney disease), stage III (HCC) 06/19/2020   Acute on chronic combined systolic and diastolic CHF (congestive heart failure) (HCC) 04/05/2020   Nonobstructive  atherosclerosis of coronary artery 12/07/2019   AKI (acute kidney injury) (HCC) 12/07/2019   Acute HFrEF (heart failure with reduced ejection fraction) (HCC)    Atrial fibrillation with RVR (HCC) 12/04/2019   Substance induced mood disorder (HCC) 02/22/2017   Alcohol intoxication (HCC) 02/22/2017   Chest pain with moderate risk for cardiac etiology 11/02/2016   Hypertensive urgency 11/02/2016   Tobacco abuse 11/02/2016   Leukocytosis 11/02/2016    Past Surgical History:  Procedure Laterality Date   RIGHT/LEFT HEART CATH AND CORONARY ANGIOGRAPHY N/A 12/07/2019   Procedure: RIGHT/LEFT HEART CATH AND CORONARY ANGIOGRAPHY;  Surgeon: Antonieta Iba, MD;  Location: [facility] INVASIVE CV LAB;  Service: Cardiovascular;  Laterality: N/A;    Prior to Admission medications   Medication Sig Start Date End Date Taking? Authorizing Provider  apixaban (ELIQUIS) 5 MG TABS tablet Take 1 tablet (5 mg total) by mouth 2 (two) times daily. 06/23/20 09/21/20  Darlin Priestly, MD  aspirin EC 81 MG EC tablet Take 1 tablet (81 mg total) by mouth daily. 11/03/16   Milagros Loll, MD  atorvastatin (LIPITOR) 80 MG tablet Take 1 tablet (80 mg total) by mouth daily. 06/23/20 09/21/20  Darlin Priestly, MD  carvedilol (COREG) 25 MG tablet Take 1.5 tablets (37.5 mg total) by mouth 2 (two) times daily with a meal. 08/23/20 11/21/20  Marisue Ivan D, PA-C  furosemide (LASIX) 40 MG tablet Take 1 tablet (40 mg total) by mouth daily. Patient taking differently: Take 40 mg by mouth 2 (two) times daily. 07/12/20 09/10/20  Arnetha Courser, MD  hydrALAZINE (APRESOLINE) 50 MG tablet Take 1 tablet (50 mg total) by mouth every 8 (eight) hours. 06/23/20 09/21/20  Darlin Priestly, MD  isosorbide mononitrate (IMDUR) 30 MG 24 hr tablet TAKE ONE-HALF TABLET BY MOUTH ONCE EVERY DAY FOR HEART (DO NOT TAKE CIALIS, VIAGRA OR LEVITRA WHILE YOU ARE ON THIS MEDICATION). 07/19/20   [provider]  losartan (COZAAR) 50 MG tablet Take 1 tablet (50 mg total) by  mouth daily. 06/23/20 09/21/20  Darlin Priestly, MD    Allergies Patient has no known allergies.  Family History  Problem Relation Age of Onset   Hypertension Mother    Hypertension Father    CAD Brother    Diabetes Brother     Social History Social History   Tobacco Use   Smoking status: Some Days    Packs/day: 0.50    Types: Cigarettes   Smokeless tobacco: Never  Substance Use Topics   Alcohol use: Yes    Alcohol/week: 7.0 - 14.0 standard drinks    Types: 7 - 14 Cans of beer per week    Comment: daily   Drug use: Never    Review of Systems  Constitutional: No fever/chills Eyes: No visual changes. ENT: No sore throat. Cardiovascular: Denies chest pain. Respiratory: Denies shortness of breath. Gastrointestinal: No abdominal pain.  No nausea, no vomiting.  No diarrhea.  No constipation. Genitourinary: Negative for dysuria. Musculoskeletal: Positive for muscle cramps.  Negative for back pain. Skin: Negative for rash. Neurological: Negative for headaches, focal weakness or numbness.   ____________________________________________   PHYSICAL EXAM:  VITAL SIGNS: ED Triage Vitals  Enc Vitals Group     BP 05/23/21 0508 (!) 131/116     Pulse Rate 05/23/21 0508 76     Resp 05/23/21 0508 20     Temp 05/23/21 0508 97.6 F (36.4 C)     Temp Source 05/23/21 0508 Oral     SpO2 05/23/21 0508 96 %     Weight 05/23/21 0512 210 lb (95.3 kg)     Height 05/23/21 0512 5\' 9"  (1.753 m)     Head Circumference --      Peak Flow --      Pain Score 05/23/21 0512 10     Pain Loc --      Pain Edu? --      Excl. in GC? --     Constitutional: Alert and oriented. Well appearing and in no acute distress.  Resting comfortably. Eyes: Conjunctivae are normal. PERRL. EOMI. Head: Atraumatic. Nose: No congestion/rhinnorhea. Mouth/Throat: Mucous membranes are moist.   Neck: No stridor.   Cardiovascular: Normal rate, regular rhythm. Grossly normal heart sounds.  Good peripheral  circulation. Respiratory: Normal respiratory effort.  No retractions. Lungs CTAB. Gastrointestinal: Soft and nontender. No distention. No abdominal bruits. No CVA tenderness. Musculoskeletal: No lower extremity tenderness nor edema.  No joint effusions. Neurologic:  Normal speech and language. No gross focal neurologic deficits are appreciated. No gait instability. Skin:  Skin is warm, dry and intact. No rash noted. Psychiatric: Mood and affect are normal. Speech and behavior are normal.  ____________________________________________   LABS (all labs ordered are listed, but only abnormal results are displayed)  Labs Reviewed  POTASSIUM  MAGNESIUM   ____________________________________________  EKG  None ____________________________________________  RADIOLOGY I, Mohamad Bruso J, personally viewed and evaluated these images (plain radiographs) as part of my medical decision making, as well as reviewing the written report by the radiologist.  ED MD interpretation: Reviewed from prior  Official radiology  report(s): DG Chest 2 View  Result Date: 05/22/2021 CLINICAL DATA:  Shortness of breath. worse when he ambulates and when he is laying down EXAM: CHEST - 2 VIEW COMPARISON:  Chest x-ray 02/28/2021, CT chest 04/04/2020, CT chest 04/04/2020 FINDINGS: Upper limits of normal cardiac silhouette. The heart and mediastinal contours are unchanged. No focal consolidation. Persistent increased interstitial markings. Trace fluid noted on lateral view within the costophrenic angles. No significant pleural effusion. No pneumothorax. No acute osseous abnormality. IMPRESSION: Persistent increased interstitial markings which could represent pulmonary edema versus chronic interstitial lung disease. Electronically Signed   By: Tish Frederickson M.D.   On: 05/22/2021 23:22    ____________________________________________   PROCEDURES  Procedure(s) performed (including Critical  Care):  Procedures   ____________________________________________   INITIAL IMPRESSION / ASSESSMENT AND PLAN / ED COURSE  As part of my medical decision making, I reviewed the following data within the electronic MEDICAL RECORD NUMBER Nursing notes reviewed and incorporated, Labs reviewed, Old chart reviewed, and Notes from prior ED visits     60 year old homeless male who has reregistered for muscle cramps; recently received IV Lasix for CHF exacerbation.  Will check potassium and magnesium.  Administer oral Ativan for generalized muscle cramping.  Will reassess.  Clinical Course as of 05/23/21 9407  Caleen Essex May 23, 2021  0701 Electrolytes within normal limits.  Strict return precautions given.  Patient verbalizes understanding agrees with plan of care [JS]    Clinical Course User Index [JS] Irean Hong, MD     ____________________________________________   FINAL CLINICAL IMPRESSION(S) / ED DIAGNOSES  Final diagnoses:  Cramps, muscle, general     ED Discharge Orders     None        Note:  This document was prepared using Dragon voice recognition software and may include unintentional dictation errors.    Irean Hong, MD 05/23/21 [id number]

## 2021-05-23 NOTE — ED Provider Notes (Signed)
[facility] Emergency Department Provider Note   ____________________________________________   Event Date/Time   First MD Initiated Contact with Patient 05/23/21 0112     (approximate)  I have reviewed the triage vital signs and the nursing notes.   HISTORY  Chief Complaint Shortness of Breath    HPI Gregory Howell is a 60 y.o. male who presents to the ED from home with a chief complaint of shortness of breath and dyspnea on exertion.  Patient has a history of CHF on Lasix, CAD, CKD, hypertension, atrial fibrillation on Eliquis who reports compliance with his Lasix.  Reports a several day history of increased shortness of breath especially with exertion.  Denies associated fever, cough, chest pain, abdominal pain, nausea, vomiting or dizziness.  Denies increased lower extremity edema.     Past Medical History:  Diagnosis Date   CHF (congestive heart failure) (HCC)    CKD (chronic kidney disease), stage II    Coronary artery disease, non-occlusive    HFrEF (heart failure with reduced ejection fraction) (HCC)    Hypertension    Hypertensive heart disease    NICM (nonischemic cardiomyopathy) (HCC)    Persistent atrial fibrillation (HCC)     Patient Active Problem List   Diagnosis Date Noted   Benign essential hypertension 03/01/2021   Cocaine dependence in remission (HCC) 03/01/2021   Homeless 03/01/2021   Acute exacerbation of CHF (congestive heart failure) (HCC) 02/28/2021   Volume overload state of heart 07/12/2020   Acute on chronic combined systolic (congestive) and diastolic (congestive) heart failure (HCC) 07/11/2020   Persistent atrial fibrillation (HCC)    NICM (nonischemic cardiomyopathy) (HCC)    Non compliance w medication regimen    Acute on chronic HFrEF (heart failure with reduced ejection fraction) (HCC) 06/19/2020   CKD (chronic kidney disease), stage III (HCC) 06/19/2020   Acute on chronic combined systolic and diastolic CHF  (congestive heart failure) (HCC) 04/05/2020   Nonobstructive atherosclerosis of coronary artery 12/07/2019   AKI (acute kidney injury) (HCC) 12/07/2019   Acute HFrEF (heart failure with reduced ejection fraction) (HCC)    Atrial fibrillation with RVR (HCC) 12/04/2019   Substance induced mood disorder (HCC) 02/22/2017   Alcohol intoxication (HCC) 02/22/2017   Chest pain with moderate risk for cardiac etiology 11/02/2016   Hypertensive urgency 11/02/2016   Tobacco abuse 11/02/2016   Leukocytosis 11/02/2016    Past Surgical History:  Procedure Laterality Date   RIGHT/LEFT HEART CATH AND CORONARY ANGIOGRAPHY N/A 12/07/2019   Procedure: RIGHT/LEFT HEART CATH AND CORONARY ANGIOGRAPHY;  Surgeon: Antonieta Iba, MD;  Location: [facility] INVASIVE CV LAB;  Service: Cardiovascular;  Laterality: N/A;    Prior to Admission medications   Medication Sig Start Date End Date Taking? Authorizing Provider  apixaban (ELIQUIS) 5 MG TABS tablet Take 1 tablet (5 mg total) by mouth 2 (two) times daily. 06/23/20 09/21/20  Darlin Priestly, MD  aspirin EC 81 MG EC tablet Take 1 tablet (81 mg total) by mouth daily. 11/03/16   Milagros Loll, MD  atorvastatin (LIPITOR) 80 MG tablet Take 1 tablet (80 mg total) by mouth daily. 06/23/20 09/21/20  Darlin Priestly, MD  carvedilol (COREG) 25 MG tablet Take 1.5 tablets (37.5 mg total) by mouth 2 (two) times daily with a meal. 08/23/20 11/21/20  Marisue Ivan D, PA-C  furosemide (LASIX) 40 MG tablet Take 1 tablet (40 mg total) by mouth daily. Patient taking differently: Take 40 mg by mouth 2 (two) times daily. 07/12/20 09/10/20  Nelson Chimes,  Tilman Neat, MD  hydrALAZINE (APRESOLINE) 50 MG tablet Take 1 tablet (50 mg total) by mouth every 8 (eight) hours. 06/23/20 09/21/20  Darlin Priestly, MD  isosorbide mononitrate (IMDUR) 30 MG 24 hr tablet TAKE ONE-HALF TABLET BY MOUTH ONCE EVERY DAY FOR HEART (DO NOT TAKE CIALIS, VIAGRA OR LEVITRA WHILE YOU ARE ON THIS MEDICATION). 07/19/20   [provider]   losartan (COZAAR) 50 MG tablet Take 1 tablet (50 mg total) by mouth daily. 06/23/20 09/21/20  Darlin Priestly, MD    Allergies Patient has no known allergies.  Family History  Problem Relation Age of Onset   Hypertension Mother    Hypertension Father    CAD Brother    Diabetes Brother     Social History Social History   Tobacco Use   Smoking status: Some Days    Packs/day: 0.50    Types: Cigarettes   Smokeless tobacco: Never  Substance Use Topics   Alcohol use: Yes    Alcohol/week: 7.0 - 14.0 standard drinks    Types: 7 - 14 Cans of beer per week    Comment: daily   Drug use: Never    Review of Systems  Constitutional: No fever/chills Eyes: No visual changes. ENT: No sore throat. Cardiovascular: Denies chest pain. Respiratory: Positive for shortness of breath. Gastrointestinal: No abdominal pain.  No nausea, no vomiting.  No diarrhea.  No constipation. Genitourinary: Negative for dysuria. Musculoskeletal: Negative for back pain. Skin: Negative for rash. Neurological: Negative for headaches, focal weakness or numbness.   ____________________________________________   PHYSICAL EXAM:  VITAL SIGNS: ED Triage Vitals [05/22/21 2250]  Enc Vitals Group     BP (!) 132/101     Pulse Rate 74     Resp 20     Temp 97.6 F (36.4 C)     Temp src      SpO2 98 %     Weight 210 lb (95.3 kg)     Height 5\' 9"  (1.753 m)     Head Circumference      Peak Flow      Pain Score 0     Pain Loc      Pain Edu?      Excl. in GC?     Constitutional: Alert and oriented. Well appearing and in mild acute distress. Eyes: Conjunctivae are normal. PERRL. EOMI. Head: Atraumatic. Nose: No congestion/rhinnorhea. Mouth/Throat: Mucous membranes are moist.   Neck: No stridor.   Cardiovascular: Normal rate, irregular rhythm. Grossly normal heart sounds.  Good peripheral circulation. Respiratory: Normal respiratory effort.  No retractions. Lungs with faint bibasilar  rales. Gastrointestinal: Soft and nontender to light or deep palpation. No distention. No abdominal bruits. No CVA tenderness. Musculoskeletal: No lower extremity tenderness nor edema.  No joint effusions. Neurologic:  Normal speech and language. No gross focal neurologic deficits are appreciated. No gait instability. Skin:  Skin is warm, dry and intact. No rash noted. Psychiatric: Mood and affect are normal. Speech and behavior are normal.  ____________________________________________   LABS (all labs ordered are listed, but only abnormal results are displayed)  Labs Reviewed  CBC WITH DIFFERENTIAL/PLATELET - Abnormal; Notable for the following components:      Result Value   WBC 11.2 (*)    MCHC 36.8 (*)    RDW 15.8 (*)    Neutro Abs 8.0 (*)    All other components within normal limits  BASIC METABOLIC PANEL - Abnormal; Notable for the following components:   Glucose, Bld 101 (*)  Creatinine, Ser 1.50 (*)    Calcium 8.5 (*)    GFR, Estimated 53 (*)    All other components within normal limits  BRAIN NATRIURETIC PEPTIDE - Abnormal; Notable for the following components:   B Natriuretic Peptide 660.4 (*)    All other components within normal limits  TROPONIN I (HIGH SENSITIVITY) - Abnormal; Notable for the following components:   Troponin I (High Sensitivity) 31 (*)    All other components within normal limits  TROPONIN I (HIGH SENSITIVITY) - Abnormal; Notable for the following components:   Troponin I (High Sensitivity) 31 (*)    All other components within normal limits   ____________________________________________  EKG  ED ECG REPORT I, Retal Tonkinson J, the attending physician, personally viewed and interpreted this ECG.   Date: 05/23/2021  EKG Time: 2255  Rate: 89  Rhythm: atrial fibrillation, rate 89  Axis: Normal  Intervals:none  ST&T Change: Nonspecific T wave changes No significant change from  03/01/2021  ____________________________________________  RADIOLOGY I, Chiquita Loth J, personally viewed and evaluated these images (plain radiographs) as part of my medical decision making, as well as reviewing the written report by the radiologist.  ED MD interpretation: Pulmonary edema  Official radiology report(s): DG Chest 2 View  Result Date: 05/22/2021 CLINICAL DATA:  Shortness of breath. worse when he ambulates and when he is laying down EXAM: CHEST - 2 VIEW COMPARISON:  Chest x-ray 02/28/2021, CT chest 04/04/2020, CT chest 04/04/2020 FINDINGS: Upper limits of normal cardiac silhouette. The heart and mediastinal contours are unchanged. No focal consolidation. Persistent increased interstitial markings. Trace fluid noted on lateral view within the costophrenic angles. No significant pleural effusion. No pneumothorax. No acute osseous abnormality. IMPRESSION: Persistent increased interstitial markings which could represent pulmonary edema versus chronic interstitial lung disease. Electronically Signed   By: Tish Frederickson M.D.   On: 05/22/2021 23:22    ____________________________________________   PROCEDURES  Procedure(s) performed (including Critical Care):  Procedures   ____________________________________________   INITIAL IMPRESSION / ASSESSMENT AND PLAN / ED COURSE  As part of my medical decision making, I reviewed the following data within the electronic MEDICAL RECORD NUMBER Nursing notes reviewed and incorporated, Labs reviewed, EKG interpreted, Old chart reviewed, Radiograph reviewed, and Notes from prior ED visits     60 year old male presenting with shortness of breath. Differential includes, but is not limited to, viral syndrome, bronchitis including COPD exacerbation, pneumonia, reactive airway disease including asthma, CHF including exacerbation with or without pulmonary/interstitial edema, pneumothorax, ACS, thoracic trauma, and pulmonary embolism.   Laboratory  results demonstrate stable creatinine, stable troponin from prior.  Will administer IV Lasix, repeat troponin and reassess.  Clinical Course as of 05/23/21 0430  Fri May 23, 2021  0257 UOP almost 1 L.  Patient feeling significantly better.  He is not tachycardic, tachypneic nor hypoxic.  Repeat troponin is stable.  Strict return precautions given.  Patient verbalizes understanding and agrees with plan of care [JS]    Clinical Course User Index [JS] Irean Hong, MD     ____________________________________________   FINAL CLINICAL IMPRESSION(S) / ED DIAGNOSES  Final diagnoses:  Dyspnea, unspecified type  Acute on chronic congestive heart failure, unspecified heart failure type [facility])     ED Discharge Orders     None        Note:  This document was prepared using Dragon voice recognition software and may include unintentional dictation errors.    Irean Hong, MD 05/23/21 0430

## 2021-05-23 NOTE — Discharge Instructions (Addendum)
You may take your Lasix today per usual routine.  Return to the ER for worsening symptoms, persistent vomiting, difficulty breathing or other concerns.

## 2021-05-23 NOTE — Discharge Instructions (Signed)
Return to the ER for worsening symptoms, persistent vomiting, difficulty breathing or other concerns. °

## 2021-05-23 NOTE — ED Triage Notes (Signed)
Pt comes into the ED c/o generalized body cramps.  Pt was discharged this morning around 3:00am and is currently homeless.  While sitting in the lobby, patient states he is getting body cramps and would like to check back in.  Pt denies any other problems at this time.  Pt believes that he is on too much lasix that was prescribed to him over 6 months ago.  Pt in NAD at this time with even and unlabored respirations.

## 2021-05-27 ENCOUNTER — Other Ambulatory Visit: Payer: Self-pay

## 2021-05-27 ENCOUNTER — Emergency Department
Admission: EM | Admit: 2021-05-27 | Discharge: 2021-05-27 | Disposition: A | Discharge status: Home / Self Care | Payer: No Typology Code available for payment source | Attending: Emergency Medicine | Admitting: Emergency Medicine

## 2021-05-27 ENCOUNTER — Emergency Department: Payer: No Typology Code available for payment source

## 2021-05-27 DIAGNOSIS — I5043 Acute on chronic combined systolic (congestive) and diastolic (congestive) heart failure: Secondary | ICD-10-CM | POA: Diagnosis not present

## 2021-05-27 DIAGNOSIS — R600 Localized edema: Secondary | ICD-10-CM | POA: Diagnosis not present

## 2021-05-27 DIAGNOSIS — R252 Cramp and spasm: Secondary | ICD-10-CM | POA: Insufficient documentation

## 2021-05-27 DIAGNOSIS — N183 Chronic kidney disease, stage 3 unspecified: Secondary | ICD-10-CM | POA: Diagnosis not present

## 2021-05-27 DIAGNOSIS — F17210 Nicotine dependence, cigarettes, uncomplicated: Secondary | ICD-10-CM | POA: Insufficient documentation

## 2021-05-27 DIAGNOSIS — I130 Hypertensive heart and chronic kidney disease with heart failure and stage 1 through stage 4 chronic kidney disease, or unspecified chronic kidney disease: Secondary | ICD-10-CM | POA: Insufficient documentation

## 2021-05-27 DIAGNOSIS — Z79899 Other long term (current) drug therapy: Secondary | ICD-10-CM | POA: Diagnosis not present

## 2021-05-27 DIAGNOSIS — I2510 Atherosclerotic heart disease of native coronary artery without angina pectoris: Secondary | ICD-10-CM | POA: Insufficient documentation

## 2021-05-27 DIAGNOSIS — Z7982 Long term (current) use of aspirin: Secondary | ICD-10-CM | POA: Insufficient documentation

## 2021-05-27 LAB — CK: Total CK: 282 U/L (ref 49–397)

## 2021-05-27 LAB — CBC
HCT: 46.2 % (ref 39.0–52.0)
Hemoglobin: 17.1 g/dL — ABNORMAL HIGH (ref 13.0–17.0)
MCH: 32.3 pg (ref 26.0–34.0)
MCHC: 37 g/dL — ABNORMAL HIGH (ref 30.0–36.0)
MCV: 87.3 fL (ref 80.0–100.0)
Platelets: 185 10*3/uL (ref 150–400)
RBC: 5.29 MIL/uL (ref 4.22–5.81)
RDW: 16.1 % — ABNORMAL HIGH (ref 11.5–15.5)
WBC: 8.9 10*3/uL (ref 4.0–10.5)
nRBC: 0 % (ref 0.0–0.2)

## 2021-05-27 LAB — BASIC METABOLIC PANEL
Anion gap: 8 (ref 5–15)
BUN: 14 mg/dL (ref 8–23)
CO2: 29 mmol/L (ref 22–32)
Calcium: 8.9 mg/dL (ref 8.9–10.3)
Chloride: 99 mmol/L (ref 98–111)
Creatinine, Ser: 1.62 mg/dL — ABNORMAL HIGH (ref 0.61–1.24)
GFR, Estimated: 48 mL/min — ABNORMAL LOW (ref >60)
Glucose, Bld: 110 mg/dL — ABNORMAL HIGH (ref 70–99)
Potassium: 4.7 mmol/L (ref 3.5–5.1)
Sodium: 136 mmol/L (ref 135–145)

## 2021-05-27 LAB — MAGNESIUM: Magnesium: 2.1 mg/dL (ref 1.7–2.4)

## 2021-05-27 LAB — BRAIN NATRIURETIC PEPTIDE: B Natriuretic Peptide: 717.8 pg/mL — ABNORMAL HIGH (ref 0.0–100.0)

## 2021-05-27 NOTE — ED Triage Notes (Addendum)
Pt states that he has been having all over body cramps and believes that it is related to taking the lasix without any potassium supplements, states that he doesn't have a prescription for them Pt also states that he is having sob and states it was worse over night

## 2021-05-27 NOTE — ED Provider Notes (Signed)
[facility]  ____________________________________________   Event Date/Time   First MD Initiated Contact with Patient 05/27/21 1222     (approximate)  I have reviewed the triage vital signs and the nursing notes.   HISTORY  Chief Complaint body cramps    HPI Gregory Howell is a 60 y.o. male past medical history of CAD, heart failure with reduced EF atrial fibrillation who presents with body cramps.  Patient says that he is having intermittent muscle cramps over the last 2 months.  He attributes this to taking Lasix.  He stopped taking the Lasix yesterday.  He has chronic dyspnea and lower extremity swelling, this is not significantly changed today.  Patient prescribed medications from [place] after an admission but he gets his care or supposed to be getting his care from the [place].  He did try to see a cardiologist here but then it was going to be too expensive so he was not seen.  No one is following him for his heart failure.  He has difficulty getting transport to the [place].  He denies fevers, chills.  Muscle cramping is diffuse but worse in his neck and hips.         Past Medical History:  Diagnosis Date   CHF (congestive heart failure) (HCC)    CKD (chronic kidney disease), stage II    Coronary artery disease, non-occlusive    HFrEF (heart failure with reduced ejection fraction) (HCC)    Hypertension    Hypertensive heart disease    NICM (nonischemic cardiomyopathy) (HCC)    Persistent atrial fibrillation (HCC)     Patient Active Problem List   Diagnosis Date Noted   Benign essential hypertension 03/01/2021   Cocaine dependence in remission (HCC) 03/01/2021   Homeless 03/01/2021   Acute exacerbation of CHF (congestive heart failure) (HCC) 02/28/2021   Volume overload state of heart 07/12/2020   Acute on chronic combined systolic (congestive) and diastolic (congestive) heart failure (HCC) 07/11/2020   Persistent atrial fibrillation (HCC)    NICM  (nonischemic cardiomyopathy) (HCC)    Non compliance w medication regimen    Acute on chronic HFrEF (heart failure with reduced ejection fraction) (HCC) 06/19/2020   CKD (chronic kidney disease), stage III (HCC) 06/19/2020   Acute on chronic combined systolic and diastolic CHF (congestive heart failure) (HCC) 04/05/2020   Nonobstructive atherosclerosis of coronary artery 12/07/2019   AKI (acute kidney injury) (HCC) 12/07/2019   Acute HFrEF (heart failure with reduced ejection fraction) (HCC)    Atrial fibrillation with RVR (HCC) 12/04/2019   Substance induced mood disorder (HCC) 02/22/2017   Alcohol intoxication (HCC) 02/22/2017   Chest pain with moderate risk for cardiac etiology 11/02/2016   Hypertensive urgency 11/02/2016   Tobacco abuse 11/02/2016   Leukocytosis 11/02/2016    Past Surgical History:  Procedure Laterality Date   RIGHT/LEFT HEART CATH AND CORONARY ANGIOGRAPHY N/A 12/07/2019   Procedure: RIGHT/LEFT HEART CATH AND CORONARY ANGIOGRAPHY;  Surgeon: Antonieta Iba, MD;  Location: [facility] INVASIVE CV LAB;  Service: Cardiovascular;  Laterality: N/A;    Prior to Admission medications   Medication Sig Start Date End Date Taking? Authorizing Provider  apixaban (ELIQUIS) 5 MG TABS tablet Take 1 tablet (5 mg total) by mouth 2 (two) times daily. 06/23/20 09/21/20  Darlin Priestly, MD  aspirin EC 81 MG EC tablet Take 1 tablet (81 mg total) by mouth daily. 11/03/16   Milagros Loll, MD  atorvastatin (LIPITOR) 80 MG tablet Take 1 tablet (80 mg total)  by mouth daily. 06/23/20 09/21/20  Darlin Priestly, MD  carvedilol (COREG) 25 MG tablet Take 1.5 tablets (37.5 mg total) by mouth 2 (two) times daily with a meal. 08/23/20 11/21/20  Marisue Ivan D, PA-C  furosemide (LASIX) 40 MG tablet Take 1 tablet (40 mg total) by mouth daily. Patient taking differently: Take 40 mg by mouth 2 (two) times daily. 07/12/20 09/10/20  Arnetha Courser, MD  hydrALAZINE (APRESOLINE) 50 MG tablet Take 1 tablet (50 mg total) by  mouth every 8 (eight) hours. 06/23/20 09/21/20  Darlin Priestly, MD  isosorbide mononitrate (IMDUR) 30 MG 24 hr tablet TAKE ONE-HALF TABLET BY MOUTH ONCE EVERY DAY FOR HEART (DO NOT TAKE CIALIS, VIAGRA OR LEVITRA WHILE YOU ARE ON THIS MEDICATION). 07/19/20   [provider]  losartan (COZAAR) 50 MG tablet Take 1 tablet (50 mg total) by mouth daily. 06/23/20 09/21/20  Darlin Priestly, MD    Allergies Patient has no known allergies.  Family History  Problem Relation Age of Onset   Hypertension Mother    Hypertension Father    CAD Brother    Diabetes Brother     Social History Social History   Tobacco Use   Smoking status: Some Days    Packs/day: 0.50    Types: Cigarettes   Smokeless tobacco: Never  Substance Use Topics   Alcohol use: Yes    Alcohol/week: 7.0 - 14.0 standard drinks    Types: 7 - 14 Cans of beer per week    Comment: daily   Drug use: Never    Review of Systems   Review of Systems  Constitutional:  Negative for fever.  Respiratory:  Positive for shortness of breath. Negative for chest tightness.   Cardiovascular:  Positive for leg swelling. Negative for chest pain.  Gastrointestinal:  Negative for abdominal distention, nausea and vomiting.  Musculoskeletal:  Positive for myalgias. Negative for back pain.  All other systems reviewed and are negative.  Physical Exam Updated Vital Signs BP (!) 178/106 (BP Location: Left Arm)   Pulse 72   Temp 98 F (36.7 C) (Oral)   Resp 18   Ht 5\' 9"  (1.753 m)   Wt 95.3 kg   SpO2 94%   BMI 31.01 kg/m   Physical Exam Vitals and nursing note reviewed.  Constitutional:      General: He is not in acute distress.    Appearance: Normal appearance.  HENT:     Head: Normocephalic and atraumatic.  Eyes:     General: No scleral icterus.    Conjunctiva/sclera: Conjunctivae normal.  Pulmonary:     Effort: Pulmonary effort is normal. No respiratory distress.     Breath sounds: Normal breath sounds. No wheezing.  Abdominal:      General: Abdomen is flat. There is no distension.     Palpations: Abdomen is soft.     Tenderness: There is no abdominal tenderness.  Musculoskeletal:        General: No deformity or signs of injury.     Cervical back: Normal range of motion.     Right lower leg: Edema present.     Left lower leg: Edema present.     Comments: Mild pitting edema bilaterally  Skin:    Coloration: Skin is not jaundiced or pale.  Neurological:     General: No focal deficit present.     Mental Status: He is alert and oriented to person, place, and time. Mental status is at baseline.  Psychiatric:  Mood and Affect: Mood normal.        Behavior: Behavior normal.     LABS (all labs ordered are listed, but only abnormal results are displayed)  Labs Reviewed  BASIC METABOLIC PANEL - Abnormal; Notable for the following components:      Result Value   Glucose, Bld 110 (*)    Creatinine, Ser 1.62 (*)    GFR, Estimated 48 (*)    All other components within normal limits  CBC - Abnormal; Notable for the following components:   Hemoglobin 17.1 (*)    MCHC 37.0 (*)    RDW 16.1 (*)    All other components within normal limits  BRAIN NATRIURETIC PEPTIDE - Abnormal; Notable for the following components:   B Natriuretic Peptide 717.8 (*)    All other components within normal limits  CK  MAGNESIUM   ____________________________________________  EKG  Atrial flutter with variable block, axis, no acute ischemic changes ____________________________________________  RADIOLOGY I, Randol Kern, personally viewed and evaluated these images (plain radiographs) as part of my medical decision making, as well as reviewing the written report by the radiologist.  ED MD interpretation: I reviewed the chest x-ray which shows some mild pulmonary edema    ____________________________________________   PROCEDURES  Procedure(s) performed (including Critical  Care):  Procedures   ____________________________________________   INITIAL IMPRESSION / ASSESSMENT AND PLAN / ED COURSE     Patient is a 60 year old male with history of CHF he is on Lasix presenting with muscle cramps.  This has been going on for the past 2 months but worse over the past following days.  Attributes this to being on Lasix he is concerned about low potassium.  Patient unfortunately is not following with anyone for his heart failure because he has difficulty getting to the [place] and is too expensive for him to come to [place] clinic.  He does have some mild dyspnea but this is chronic for him and is not the reason why he presents today.  Vital signs notable for hypertension, pulse ox low normal.  On exam he appears well respiratory distress.  Does have some milder extremity movement lungs are clear.  His labs showed normal potassium.  I did check the magnesium as well which is normal as well as a CK.  His BNP was checked from triage is around 700 which is where it has been in the past.  Chest x-ray also obtained which does show some mild pulmonary edema.  I am unsure what the cause of his muscle cramping is, ruled out rhabdo or significant electrolyte abnormality.  He seems to be at his baseline in terms of his CHF.  I recommended he continue to take the Lasix as I am concerned that he would develop significant volume overload if he stops.  [facility] be seen by a cardiologist in follow-up for this.  Clinical Course as of 05/27/21 1327  Tue May 27, 2021  1323 CK Total: 282 [KM]    Clinical Course User Index [KM] Georga Hacking, MD     ____________________________________________   FINAL CLINICAL IMPRESSION(S) / ED DIAGNOSES  Final diagnoses:  Muscle cramping     ED Discharge Orders     None        Note:  This document was prepared using Dragon voice recognition software and may include unintentional  dictation errors.    Georga Hacking, MD 05/27/21 [phone]

## 2021-05-27 NOTE — Discharge Instructions (Addendum)
Your electrolytes were normal today.  Please continue to take your Lasix, as you may develop fluid in your lungs if you stop.  [facility]

## 2021-05-27 NOTE — ED Notes (Signed)
UA sent to lab

## 2021-05-27 NOTE — ED Notes (Signed)
Pt refusing full set of VS, answered all questions regarding d/c. Pt expresses upset d/t uncertain causes to muscle cramping. Advised to continue lasix as instructed by Dr Sidney Ace and [facility].  Pt expressed frustration with being told similar instructions at recent hospital visit.  Ambulatory, steady gait

## 2021-09-09 ENCOUNTER — Inpatient Hospital Stay
Admission: EM | Admit: 2021-09-09 | Discharge: 2021-09-11 | DRG: 291 | Disposition: A | Discharge status: Home / Self Care | Payer: No Typology Code available for payment source | Attending: Obstetrics and Gynecology | Admitting: Obstetrics and Gynecology

## 2021-09-09 ENCOUNTER — Other Ambulatory Visit: Payer: Self-pay

## 2021-09-09 ENCOUNTER — Emergency Department: Payer: No Typology Code available for payment source

## 2021-09-09 DIAGNOSIS — Z833 Family history of diabetes mellitus: Secondary | ICD-10-CM

## 2021-09-09 DIAGNOSIS — I509 Heart failure, unspecified: Secondary | ICD-10-CM | POA: Diagnosis not present

## 2021-09-09 DIAGNOSIS — I4891 Unspecified atrial fibrillation: Secondary | ICD-10-CM | POA: Diagnosis not present

## 2021-09-09 DIAGNOSIS — Z8249 Family history of ischemic heart disease and other diseases of the circulatory system: Secondary | ICD-10-CM

## 2021-09-09 DIAGNOSIS — Z7982 Long term (current) use of aspirin: Secondary | ICD-10-CM

## 2021-09-09 DIAGNOSIS — I10 Essential (primary) hypertension: Secondary | ICD-10-CM | POA: Diagnosis not present

## 2021-09-09 DIAGNOSIS — I5023 Acute on chronic systolic (congestive) heart failure: Secondary | ICD-10-CM | POA: Diagnosis not present

## 2021-09-09 DIAGNOSIS — F17210 Nicotine dependence, cigarettes, uncomplicated: Secondary | ICD-10-CM | POA: Diagnosis present

## 2021-09-09 DIAGNOSIS — Z720 Tobacco use: Secondary | ICD-10-CM | POA: Diagnosis present

## 2021-09-09 DIAGNOSIS — Z79899 Other long term (current) drug therapy: Secondary | ICD-10-CM

## 2021-09-09 DIAGNOSIS — M7989 Other specified soft tissue disorders: Secondary | ICD-10-CM | POA: Diagnosis present

## 2021-09-09 DIAGNOSIS — Z20822 Contact with and (suspected) exposure to covid-19: Secondary | ICD-10-CM | POA: Diagnosis present

## 2021-09-09 DIAGNOSIS — E8779 Other fluid overload: Secondary | ICD-10-CM | POA: Diagnosis present

## 2021-09-09 DIAGNOSIS — F1421 Cocaine dependence, in remission: Secondary | ICD-10-CM | POA: Diagnosis present

## 2021-09-09 DIAGNOSIS — E119 Type 2 diabetes mellitus without complications: Secondary | ICD-10-CM

## 2021-09-09 DIAGNOSIS — Z888 Allergy status to other drugs, medicaments and biological substances status: Secondary | ICD-10-CM

## 2021-09-09 DIAGNOSIS — I428 Other cardiomyopathies: Secondary | ICD-10-CM | POA: Diagnosis present

## 2021-09-09 DIAGNOSIS — I130 Hypertensive heart and chronic kidney disease with heart failure and stage 1 through stage 4 chronic kidney disease, or unspecified chronic kidney disease: Principal | ICD-10-CM | POA: Diagnosis present

## 2021-09-09 DIAGNOSIS — I5082 Biventricular heart failure: Secondary | ICD-10-CM | POA: Diagnosis present

## 2021-09-09 DIAGNOSIS — F1020 Alcohol dependence, uncomplicated: Secondary | ICD-10-CM | POA: Diagnosis present

## 2021-09-09 DIAGNOSIS — E785 Hyperlipidemia, unspecified: Secondary | ICD-10-CM | POA: Diagnosis present

## 2021-09-09 DIAGNOSIS — N183 Chronic kidney disease, stage 3 unspecified: Secondary | ICD-10-CM | POA: Diagnosis present

## 2021-09-09 DIAGNOSIS — E1122 Type 2 diabetes mellitus with diabetic chronic kidney disease: Secondary | ICD-10-CM | POA: Diagnosis present

## 2021-09-09 DIAGNOSIS — Z7901 Long term (current) use of anticoagulants: Secondary | ICD-10-CM

## 2021-09-09 DIAGNOSIS — I4819 Other persistent atrial fibrillation: Secondary | ICD-10-CM | POA: Diagnosis present

## 2021-09-09 DIAGNOSIS — N1831 Chronic kidney disease, stage 3a: Secondary | ICD-10-CM | POA: Diagnosis present

## 2021-09-09 DIAGNOSIS — I2510 Atherosclerotic heart disease of native coronary artery without angina pectoris: Secondary | ICD-10-CM | POA: Diagnosis present

## 2021-09-09 DIAGNOSIS — N1832 Chronic kidney disease, stage 3b: Secondary | ICD-10-CM | POA: Diagnosis present

## 2021-09-09 LAB — BASIC METABOLIC PANEL
Anion gap: 9 (ref 5–15)
BUN: 17 mg/dL (ref 8–23)
CO2: 26 mmol/L (ref 22–32)
Calcium: 8.7 mg/dL — ABNORMAL LOW (ref 8.9–10.3)
Chloride: 102 mmol/L (ref 98–111)
Creatinine, Ser: 1.62 mg/dL — ABNORMAL HIGH (ref 0.61–1.24)
GFR, Estimated: 48 mL/min — ABNORMAL LOW (ref >60)
Glucose, Bld: 105 mg/dL — ABNORMAL HIGH (ref 70–99)
Potassium: 4 mmol/L (ref 3.5–5.1)
Sodium: 137 mmol/L (ref 135–145)

## 2021-09-09 LAB — HEPATIC FUNCTION PANEL
ALT: 33 U/L (ref 0–44)
AST: 35 U/L (ref 15–41)
Albumin: 3.3 g/dL — ABNORMAL LOW (ref 3.5–5.0)
Alkaline Phosphatase: 148 U/L — ABNORMAL HIGH (ref 38–126)
Bilirubin, Direct: 0.5 mg/dL — ABNORMAL HIGH (ref 0.0–0.2)
Indirect Bilirubin: 1 mg/dL — ABNORMAL HIGH (ref 0.3–0.9)
Total Bilirubin: 1.5 mg/dL — ABNORMAL HIGH (ref 0.3–1.2)
Total Protein: 7.1 g/dL (ref 6.5–8.1)

## 2021-09-09 LAB — URINALYSIS, MICROSCOPIC (REFLEX)
RBC / HPF: NONE SEEN RBC/hpf (ref 0–5)
Squamous Epithelial / HPF: NONE SEEN (ref 0–5)

## 2021-09-09 LAB — CBC
HCT: 42.8 % (ref 39.0–52.0)
Hemoglobin: 15 g/dL (ref 13.0–17.0)
MCH: 30.1 pg (ref 26.0–34.0)
MCHC: 35 g/dL (ref 30.0–36.0)
MCV: 85.9 fL (ref 80.0–100.0)
Platelets: 270 10*3/uL (ref 150–400)
RBC: 4.98 MIL/uL (ref 4.22–5.81)
RDW: 16 % — ABNORMAL HIGH (ref 11.5–15.5)
WBC: 8.5 10*3/uL (ref 4.0–10.5)
nRBC: 0 % (ref 0.0–0.2)

## 2021-09-09 LAB — TSH: TSH: 3.069 u[IU]/mL (ref 0.350–4.500)

## 2021-09-09 LAB — URINALYSIS, ROUTINE W REFLEX MICROSCOPIC
Bilirubin Urine: NEGATIVE
Glucose, UA: NEGATIVE mg/dL
Hgb urine dipstick: NEGATIVE
Ketones, ur: NEGATIVE mg/dL
Leukocytes,Ua: NEGATIVE
Nitrite: NEGATIVE
Protein, ur: 100 mg/dL — AB
Specific Gravity, Urine: 1.01 (ref 1.005–1.030)
pH: 6 (ref 5.0–8.0)

## 2021-09-09 LAB — BRAIN NATRIURETIC PEPTIDE: B Natriuretic Peptide: 526.2 pg/mL — ABNORMAL HIGH (ref 0.0–100.0)

## 2021-09-09 LAB — TROPONIN I (HIGH SENSITIVITY)
Troponin I (High Sensitivity): 224 ng/L (ref <18)
Troponin I (High Sensitivity): 226 ng/L (ref <18)

## 2021-09-09 LAB — PROCALCITONIN: Procalcitonin: <0.1 ng/mL

## 2021-09-09 LAB — RESP PANEL BY RT-PCR (FLU A&B, COVID) ARPGX2
Influenza A by PCR: NEGATIVE
Influenza B by PCR: NEGATIVE
SARS Coronavirus 2 by RT PCR: NEGATIVE

## 2021-09-09 LAB — PROTIME-INR
INR: 1.2 (ref 0.8–1.2)
Prothrombin Time: 14.8 seconds (ref 11.4–15.2)

## 2021-09-09 LAB — MAGNESIUM: Magnesium: 1.9 mg/dL (ref 1.7–2.4)

## 2021-09-09 LAB — CBG MONITORING, ED: Glucose-Capillary: 107 mg/dL — ABNORMAL HIGH (ref 70–99)

## 2021-09-09 LAB — PHOSPHORUS: Phosphorus: 4 mg/dL (ref 2.5–4.6)

## 2021-09-09 MED ORDER — FUROSEMIDE 10 MG/ML IJ SOLN
80.0000 mg | Freq: Once | INTRAMUSCULAR | Status: AC
  Administered 2021-09-09: 80 mg via INTRAVENOUS
  Filled 2021-09-09: qty 8

## 2021-09-09 MED ORDER — LOSARTAN POTASSIUM 25 MG PO TABS
25.0000 mg | ORAL_TABLET | Freq: Every day | ORAL | Status: DC
  Administered 2021-09-11: 25 mg via ORAL
  Filled 2021-09-09: qty 1

## 2021-09-09 MED ORDER — ENOXAPARIN SODIUM 40 MG/0.4ML IJ SOSY: 40.0000 mg | PREFILLED_SYRINGE | Freq: Every day | INTRAMUSCULAR | Status: DC

## 2021-09-09 MED ORDER — DILTIAZEM HCL 25 MG/5ML IV SOLN
15.0000 mg | Freq: Once | INTRAVENOUS | Status: AC
  Administered 2021-09-09: 15 mg via INTRAVENOUS
  Filled 2021-09-09: qty 5

## 2021-09-09 MED ORDER — THIAMINE HCL 100 MG PO TABS
100.0000 mg | ORAL_TABLET | Freq: Every day | ORAL | Status: DC
  Administered 2021-09-09 – 2021-09-11 (×3): 100 mg via ORAL
  Filled 2021-09-09 (×3): qty 1

## 2021-09-09 MED ORDER — CARVEDILOL 25 MG PO TABS
25.0000 mg | ORAL_TABLET | Freq: Two times a day (BID) | ORAL | Status: DC
  Administered 2021-09-09 – 2021-09-11 (×4): 25 mg via ORAL
  Filled 2021-09-09 (×4): qty 1

## 2021-09-09 MED ORDER — THIAMINE HCL 100 MG/ML IJ SOLN
100.0000 mg | Freq: Every day | INTRAMUSCULAR | Status: DC
  Filled 2021-09-09: qty 2

## 2021-09-09 MED ORDER — HYDRALAZINE HCL 10 MG PO TABS
10.0000 mg | ORAL_TABLET | Freq: Four times a day (QID) | ORAL | Status: DC | PRN
Start: 2021-09-09 — End: 2021-09-09

## 2021-09-09 MED ORDER — INSULIN ASPART 100 UNIT/ML IJ SOLN: 0.0000 [IU] | Freq: Three times a day (TID) | INTRAMUSCULAR | Status: DC

## 2021-09-09 MED ORDER — ACETAMINOPHEN 650 MG RE SUPP: 650.0000 mg | Freq: Four times a day (QID) | RECTAL | Status: DC | PRN

## 2021-09-09 MED ORDER — ADULT MULTIVITAMIN W/MINERALS CH
1.0000 | ORAL_TABLET | Freq: Every day | ORAL | Status: DC
  Administered 2021-09-09 – 2021-09-11 (×3): 1 via ORAL
  Filled 2021-09-09 (×3): qty 1

## 2021-09-09 MED ORDER — ONDANSETRON HCL 4 MG PO TABS: 4.0000 mg | ORAL_TABLET | Freq: Four times a day (QID) | ORAL | Status: DC | PRN

## 2021-09-09 MED ORDER — DILTIAZEM HCL-DEXTROSE 125-5 MG/125ML-% IV SOLN (PREMIX)
5.0000 mg/h | INTRAVENOUS | Status: DC
  Administered 2021-09-09: 5 mg/h via INTRAVENOUS
  Filled 2021-09-09: qty 125

## 2021-09-09 MED ORDER — FOLIC ACID 1 MG PO TABS
1.0000 mg | ORAL_TABLET | Freq: Every day | ORAL | Status: DC
  Administered 2021-09-09 – 2021-09-11 (×3): 1 mg via ORAL
  Filled 2021-09-09 (×3): qty 1

## 2021-09-09 MED ORDER — TRAZODONE HCL 100 MG PO TABS
100.0000 mg | ORAL_TABLET | Freq: Every day | ORAL | Status: DC
  Administered 2021-09-09 – 2021-09-10 (×2): 100 mg via ORAL
  Filled 2021-09-09 (×2): qty 1

## 2021-09-09 MED ORDER — ISOSORBIDE MONONITRATE ER 30 MG PO TB24
30.0000 mg | ORAL_TABLET | Freq: Every day | ORAL | Status: DC
  Administered 2021-09-11: 30 mg via ORAL
  Filled 2021-09-09: qty 1

## 2021-09-09 MED ORDER — ONDANSETRON HCL 4 MG/2ML IJ SOLN: 4.0000 mg | Freq: Four times a day (QID) | INTRAMUSCULAR | Status: DC | PRN

## 2021-09-09 MED ORDER — ATORVASTATIN CALCIUM 80 MG PO TABS
80.0000 mg | ORAL_TABLET | Freq: Every day | ORAL | Status: DC
  Administered 2021-09-09 – 2021-09-10 (×2): 80 mg via ORAL
  Filled 2021-09-09: qty 4
  Filled 2021-09-09: qty 1

## 2021-09-09 MED ORDER — ACETAMINOPHEN 325 MG PO TABS: 650.0000 mg | ORAL_TABLET | Freq: Four times a day (QID) | ORAL | Status: DC | PRN

## 2021-09-09 MED ORDER — LORAZEPAM 2 MG/ML IJ SOLN: 1.0000 mg | INTRAMUSCULAR | Status: DC | PRN

## 2021-09-09 MED ORDER — ASPIRIN 81 MG PO TBEC: 81.0000 mg | DELAYED_RELEASE_TABLET | Freq: Every day | ORAL | Status: DC

## 2021-09-09 MED ORDER — HYDRALAZINE HCL 10 MG PO TABS
10.0000 mg | ORAL_TABLET | Freq: Four times a day (QID) | ORAL | Status: DC | PRN
  Filled 2021-09-09: qty 1

## 2021-09-09 MED ORDER — INSULIN ASPART 100 UNIT/ML IJ SOLN: 0.0000 [IU] | Freq: Every day | INTRAMUSCULAR | Status: DC

## 2021-09-09 MED ORDER — LORAZEPAM 1 MG PO TABS: 1.0000 mg | ORAL_TABLET | ORAL | Status: DC | PRN

## 2021-09-09 MED ORDER — FUROSEMIDE 10 MG/ML IJ SOLN: 60.0000 mg | Freq: Every day | INTRAMUSCULAR | Status: DC

## 2021-09-09 MED ORDER — APIXABAN 5 MG PO TABS
5.0000 mg | ORAL_TABLET | Freq: Two times a day (BID) | ORAL | Status: DC
  Administered 2021-09-09 – 2021-09-11 (×4): 5 mg via ORAL
  Filled 2021-09-09 (×4): qty 1

## 2021-09-09 NOTE — ED Notes (Signed)
Dr. Fuller Plan notified via secure chat of critical lab value: Troponin 224.

## 2021-09-09 NOTE — Assessment & Plan Note (Signed)
-   Resume Eliquis 5 mg p.o. twice daily, carvedilol 25 mg p.o. twice daily

## 2021-09-09 NOTE — Assessment & Plan Note (Signed)
-   Continue diltiazem gtt. - Admit to progressive cardiac, observation, telemetry

## 2021-09-09 NOTE — ED Triage Notes (Signed)
Pt states that stayed in the [place] in [place] last 2 weeks of November. Pt states that he has started breathing hard again and swelling to his legs.

## 2021-09-09 NOTE — H&P (Signed)
History and Physical   Gregory Howell EBR:[id number] DOB: 12-25-1960 DOA: 09/09/2021  PCP: [facility] [place]  Patient coming from: home  I have personally briefly reviewed patient's old medical records in [place].  Chief Concern: Shortness of breath and swelling in his legs  HPI: 61 year old male with history of nonischemic cardiomyopathy, alcohol abuse, tobacco dependence, hypertension, hyperlipidemia, history of CKD 2/3, CAD, persistent atrial fibrillation on Eliquis, who presents emergency department for chief concerns of shortness of breath.  Vitals in the emergency department showed temperature of 98.8, respiration rate of 20, heart rate of 139 initially, blood pressure 160/104, SPO2 of 98% on room air.  Labs in the emergency department showed serum sodium 137, potassium 4.0, chloride 102, bicarb 26, BUN of 17, serum creatinine of 1.62, nonfasting blood glucose 105, GFR 48.    WBC was 8.5, hemoglobin was 15, platelets of 270.  Alk phos was elevated at 148, BNP elevated at 526.2, high sensitive troponin was 224.  COVID/influenza A/influenza B PCR were negative.  In the emergency department patient received furosemide 80 mg IV, diltiazem 15 mg IV one-time dose and then patient was started on diltiazem GGT.  At bedside, he is able to tell me his age, name, location.   He reports shortness of breath that is worse about 1 week ago. He states it is worse when he lays flat. He states he sleep in chair. He endorses swelling of his lower extremities. He denies trauma, chest pain, known weight gain.   He reports the following in terms of daily fluid intake:  Water: 1 L of water per day = 34 oz Juice: three 12 oz juice per day = 36 oz Milk: 12 oz milk Etoh: every other day, 24 oz of beers every other day; 12 oz per day Soup: 1-2 cereal bowls per week; 10-20 oz per day Soda: three 20 oz bottlers per day; 60 oz per day  Coffee: one cup per week                                                 Total: approximately 164 - 174 oz per day  He has a dry cough that started about 6 months.  He denies history of etoh withdrawel, tremors, or seizures.   Social history: He lives with his wife and grandson. He currently 3-4 cigarettes per day. At his peak, he was smoking 1/2 ppd. He drinks etoh every other day. His last etoh drink was 09/08/21. He denies current recreational drug use. 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   Vaccination history: He is vaccinated for covid19, two doses. He is vaccinated for influenza in November 2022.   ROS: Constitutional: no weight change, no fever ENT/Mouth: no sore throat, no rhinorrhea Eyes: no eye pain, no vision changes Cardiovascular: no chest pain, + dyspnea,  + edema, no palpitations Respiratory: no cough, no sputum, no wheezing Gastrointestinal: no nausea, no vomiting, no diarrhea, no constipation Genitourinary: no urinary incontinence, no dysuria, no hematuria Musculoskeletal: no arthralgias, no myalgias Skin: no skin lesions, no pruritus, Neuro: no weakness, no loss of consciousness, no syncope Psych: no anxiety, no depression, no decrease appetite Heme/Lymph: no bruising, no bleeding  ED Course: Discussed with emergency department with chief concerns of atrial fibrillation with RVR.  Assessment/Plan  Principal Problem:  Acute exacerbation of CHF (congestive heart failure) (HCC) Active Problems:   Tobacco abuse   Persistent atrial fibrillation (HCC)   NICM (nonischemic cardiomyopathy) (HCC)   Volume overload state of heart   Benign essential hypertension   Alcohol dependence (HCC)   Atrial fibrillation with RVR (HCC)   CKD (chronic kidney disease), stage III (HCC)   Cocaine dependence in remission ([place])   Diabetes mellitus type 2, noninsulin dependent ([place])    Cardiovascular and Mediastinum Atrial fibrillation with RVR (HCC) Assessment & Plan - Continue diltiazem gtt. - Admit to  progressive cardiac, observation, telemetry  Benign essential hypertension Assessment & Plan - Resumed home carvedilol 25 mg p.o. twice daily, losartan 25 mg daily, Imdur 30 mg daily - Patient is currently normal self, did not resume home hydralazine 50 mg every 8 hours - Resumed hydralazine 10 mg p.o. every 6 hours as needed for SBP greater than 180, 3 days coverage ordered  NICM (nonischemic cardiomyopathy) ([place]) Assessment & Plan - Counseled patient on alcohol cessation - He endorses understanding and compliance  Persistent atrial fibrillation (HCC) Assessment & Plan - Resume Eliquis 5 mg p.o. twice daily, carvedilol 25 mg p.o. twice daily  * Acute exacerbation of CHF (congestive heart failure) ([place]) Assessment & Plan - Presumed secondary to over consumption of fluid, please see HPI for total - I have counseled patient that he needs to limit his liquid intake to approximately 35 to 50 ounces per day, total - Strict I's and O's - Status post furosemide 80 mg IV per EDP one-time dose - Ordered furosemide 60 mg IV daily, one-time dose, on 09/10/2021 - Prior echocardiogram on 02/28/2021 was read as ejection fraction estimated at 30 to 35%, RVSP of 38.5 mmHg.  Endocrine Diabetes mellitus type 2, noninsulin dependent ([place]) Assessment & Plan - Did not resume home Jardiance - Insulin SSI with at bedtime coverage ordered  Genitourinary CKD (chronic kidney disease), stage III ([place]) Assessment & Plan - At baseline  Other Alcohol dependence ([place]) Assessment & Plan - CIWA protocol ordered  Chart reviewed.   DVT prophylaxis: Eliquis 5 mg p.o. twice daily Code Status: Full code Diet: Heart healthy Family Communication: no Disposition Plan: Pending clinical course Consults called: None at this time Admission status: Progressive cardiac, observation, telemetry  Past Medical History:  Diagnosis Date   CHF (congestive heart failure) (HCC)    CKD (chronic kidney disease), stage II     Coronary artery disease, non-occlusive    HFrEF (heart failure with reduced ejection fraction) (HCC)    Hypertension    Hypertensive heart disease    NICM (nonischemic cardiomyopathy) ([place])    Persistent atrial fibrillation ([place])    Past Surgical History:  Procedure Laterality Date   RIGHT/LEFT HEART CATH AND CORONARY ANGIOGRAPHY N/A 12/07/2019   Procedure: RIGHT/LEFT HEART CATH AND CORONARY ANGIOGRAPHY;  Surgeon: Minna Merritts, MD;  Location: [facility];  Service: Cardiovascular;  Laterality: N/A;   Social History:  reports that he has been smoking cigarettes. He has been smoking an average of .5 packs per day. He has never used smokeless tobacco. He reports current alcohol use of about 7.0 - 14.0 standard drinks per week. He reports that he does not use drugs.  Allergies  Allergen Reactions   Lisinopril     Other reaction(s): ANGIOEDEMA OF LIPS   Family History  Problem Relation Age of Onset   Hypertension Mother    Hypertension Father    CAD Brother    Diabetes Brother  Family history: Family history reviewed and not pertinent.  Prior to Admission medications   Medication Sig Start Date End Date Taking? Authorizing Provider  apixaban (ELIQUIS) 5 MG TABS tablet Take 5 mg by mouth 2 (two) times daily. 07/09/21  Yes [provider]  atorvastatin (LIPITOR) 80 MG tablet Take 80 mg by mouth at bedtime. 07/09/21  Yes [provider]  carvedilol (COREG) 25 MG tablet Take 25 mg by mouth 2 (two) times daily. 07/09/21  Yes [provider]  cyclobenzaprine (FLEXERIL) 5 MG tablet Take 5 mg by mouth 3 (three) times daily as needed for cramping. 06/27/21  Yes [provider]  empagliflozin (JARDIANCE) 10 MG TABS tablet Take 10 mg by mouth daily. 06/27/21  Yes [provider]  furosemide (LASIX) 40 MG tablet Take 1 tablet (40 mg total) by mouth daily. Patient taking differently: Take 40 mg by mouth 2 (two) times daily. 07/12/20  09/09/21 Yes Lorella Nimrod, MD  hydrALAZINE (APRESOLINE) 50 MG tablet Take 1 tablet (50 mg total) by mouth every 8 (eight) hours. 06/23/20 09/09/21 Yes Enzo Bi, MD  isosorbide mononitrate (IMDUR) 30 MG 24 hr tablet TAKE ONE-HALF TABLET BY MOUTH ONCE EVERY DAY FOR HEART (DO NOT TAKE CIALIS, VIAGRA OR LEVITRA WHILE YOU ARE ON THIS MEDICATION). 07/19/20  Yes [provider]  losartan (COZAAR) 25 MG tablet Take 25 mg by mouth daily. 07/09/21  Yes [provider]  traZODone (DESYREL) 100 MG tablet Take 100 mg by mouth at bedtime. 07/09/21  Yes [provider]  aspirin EC 81 MG EC tablet Take 1 tablet (81 mg total) by mouth daily. Patient not taking: Reported on 09/09/2021 11/03/16   Hillary Bow, MD   Physical Exam: Vitals:   09/09/21 1915 09/09/21 1930 09/09/21 2100 09/09/21 2200  BP: (!) 119/101 (!) 128/92  (!) 124/101  Pulse: 78 (!) 109    Resp: (!) 30 (!) 31  15  Temp:      TempSrc:      SpO2: 96% 95%  96%  Weight:   95.3 kg   Height:       Constitutional: appears age-appropriate, NAD, calm, comfortable Eyes: PERRL, lids and conjunctivae normal ENMT: Mucous membranes are moist. Posterior pharynx clear of any exudate or lesions. Age-appropriate dentition. Hearing appropriate Neck: normal, supple, no masses, no thyromegaly Respiratory: clear to auscultation bilaterally, no wheezing, no crackles. Normal respiratory effort. No accessory muscle use.  Cardiovascular: Regular rate and rhythm, no murmurs / rubs / gallops.  Bilateral lower extremity 3+ pitting edema. 2+ pedal pulses. No carotid bruits.  Abdomen: no tenderness, no masses palpated, no hepatosplenomegaly. Bowel sounds positive.  Musculoskeletal: no clubbing / cyanosis. No joint deformity upper and lower extremities. Good ROM, no contractures, no atrophy. Normal muscle tone.  Skin: no rashes, lesions, ulcers. No induration Neurologic: Sensation intact. Strength 5/5 in all 4.  Psychiatric: Normal judgment  and insight. Alert and oriented x 3. Normal mood.   EKG: independently reviewed, showing atrial fibrillation with RVR, rate of 140, QTc of 470  Chest x-ray on Admission: I personally reviewed and I agree with radiologist reading as below.  DG Chest 2 View  Result Date: 09/09/2021 CLINICAL DATA:  A 61 year old male present is for evaluation of shortness of breath. EXAM: CHEST - 2 VIEW COMPARISON:  May 27, 2021. FINDINGS: Cardiomediastinal contours and hilar structures are stable. No sign of consolidation, or pleural effusion. Mild interstitial prominence is a stable finding. No visible pneumothorax. On limited assessment there is no  acute skeletal process. IMPRESSION: Stable chronic mild interstitial prominence, no acute cardiopulmonary disease. Electronically Signed   By: Zetta Bills M.D.   On: 09/09/2021 18:50    Labs on Admission: I have personally reviewed following labs  CBC: Recent Labs  Lab 09/09/21 1829  WBC 8.5  HGB 15.0  HCT 42.8  MCV 85.9  PLT 465   Basic Metabolic Panel: Recent Labs  Lab 09/09/21 1829 09/09/21 1841 09/09/21 2141  NA 137  --   --   K 4.0  --   --   CL 102  --   --   CO2 26  --   --   GLUCOSE 105*  --   --   BUN 17  --   --   CREATININE 1.62*  --   --   CALCIUM 8.7*  --   --   MG  --  1.9  --   PHOS  --   --  4.0   GFR: Estimated Creatinine Clearance: 53.8 mL/min (A) (by C-G formula based on SCr of 1.62 mg/dL (H)).  Liver Function Tests: Recent Labs  Lab 09/09/21 1841  AST 35  ALT 33  ALKPHOS 148*  BILITOT 1.5*  PROT 7.1  ALBUMIN 3.3*   Coagulation Profile: Recent Labs  Lab 09/09/21 1829  INR 1.2   Urine analysis:    Component Value Date/Time   COLORURINE YELLOW 09/09/2021 1917   APPEARANCEUR CLEAR 09/09/2021 1917   LABSPEC 1.010 09/09/2021 1917   PHURINE 6.0 09/09/2021 1917   GLUCOSEU NEGATIVE 09/09/2021 1917   HGBUR NEGATIVE 09/09/2021 [place] 09/09/2021 [place] 09/09/2021  1917   PROTEINUR 100 (A) 09/09/2021 1917   NITRITE NEGATIVE 09/09/2021 1917   LEUKOCYTESUR NEGATIVE 09/09/2021 1917   Dr. Tobie Poet [facility]  If 7PM-7AM, please contact overnight-coverage provider If 7AM-7PM, please contact day coverage provider [email or web address]  09/09/2021, 11:18 PM

## 2021-09-09 NOTE — Assessment & Plan Note (Addendum)
-   Presumed secondary to over consumption of fluid, please see HPI for total - I have counseled patient that he needs to limit his liquid intake to approximately 35 to 50 ounces per day, total - Strict I's and O's - Status post furosemide 80 mg IV per EDP one-time dose - Ordered furosemide 60 mg IV daily, one-time dose, on 09/10/2021 - Prior echocardiogram on 02/28/2021 was read as ejection fraction estimated at 30 to 35%, RVSP of 38.5 mmHg.

## 2021-09-09 NOTE — Assessment & Plan Note (Signed)
-   CIWA protocol ordered

## 2021-09-09 NOTE — Assessment & Plan Note (Signed)
At baseline 

## 2021-09-09 NOTE — Hospital Course (Addendum)
61 year old male with history of nonischemic cardiomyopathy, alcohol abuse, tobacco dependence, hypertension, hyperlipidemia, history of CKD 2/3, CAD, persistent atrial fibrillation on Eliquis, who presents emergency department for chief concerns of shortness of breath.  Vitals in the emergency department showed temperature of 98.8, respiration rate of 20, heart rate of 139 initially, blood pressure 160/104, SPO2 of 98% on room air.  Labs in the emergency department showed serum sodium 137, potassium 4.0, chloride 102, bicarb 26, BUN of 17, serum creatinine of 1.62, nonfasting blood glucose 105, GFR 48.    WBC was 8.5, hemoglobin was 15, platelets of 270.  Alk phos was elevated at 148, BNP elevated at 526.2, high sensitive troponin was 224.  COVID/influenza A/influenza B PCR were negative.  In the emergency department patient received furosemide 80 mg IV, diltiazem 15 mg IV one-time dose and then patient was started on diltiazem GGT.

## 2021-09-09 NOTE — Assessment & Plan Note (Signed)
-   Did not resume home Jardiance - Insulin SSI with at bedtime coverage ordered

## 2021-09-09 NOTE — Assessment & Plan Note (Signed)
-   Counseled patient on alcohol cessation - He endorses understanding and compliance

## 2021-09-09 NOTE — ED Provider Notes (Signed)
[facility] Provider Note    Event Date/Time   First MD Initiated Contact with Patient 09/09/21 1847     (approximate)   History   Shortness of Breath and Leg Swelling   HPI  Gregory Howell is a 61 y.o. male  who gets most of his care from the [place] who comes in for SOB and leg swelling.  Patient reports a history of chronic atrial fibrillation.  He is on Eliquis and carvedilol.  He reports over the past few days has had some worsening shortness of breath and leg swelling.  He is reports being compliant with his Lasix.  Denies ever having his legs increase in size like this before.  He denies any abdominal pain, falls, hitting his head.     Physical Exam   Triage Vital Signs: ED Triage Vitals  Enc Vitals Group     BP 09/09/21 1824 (!) 160/104     Pulse Rate 09/09/21 1824 (!) 144     Resp 09/09/21 1824 20     Temp 09/09/21 1824 98.8 F (37.1 C)     Temp Source 09/09/21 1824 Oral     SpO2 09/09/21 1824 98 %     Weight 09/09/21 1827 210 lb (95.3 kg)     Height 09/09/21 1827 5\' 9"  (1.753 m)     Head Circumference --      Peak Flow --      Pain Score 09/09/21 1826 8     Pain Loc --      Pain Edu? --      Excl. in GC? --     Most recent vital signs: Vitals:   09/09/21 1824  BP: (!) 160/104  Pulse: (!) 144  Resp: 20  Temp: 98.8 F (37.1 C)  SpO2: 98%     General: Awake, no distress.  CV:  Good peripheral perfusion.  Irregular, tachycardic Resp:  Normal effort.  Abd:  No distention.  Nontender Other:  Edema bilaterally   ED Results / Procedures / Treatments   Labs (all labs ordered are listed, but only abnormal results are displayed) Labs Reviewed  CBC - Abnormal; Notable for the following components:      Result Value   RDW 16.0 (*)    All other components within normal limits  BASIC METABOLIC PANEL  PROTIME-INR  TROPONIN I (HIGH SENSITIVITY)     EKG  My interpretation of EKG:  Atrial fib versus atrial flutter with a rate  of 138 without any ST elevations, T wave versions in the inferior lateral leads  RADIOLOGY I have reviewed the xray personally and agree with radiology read chronic interstitial markings.   PROCEDURES:  Critical Care performed: yes   .Critical Care Performed by: 09/11/21, MD Authorized by: Concha Se, MD   Critical care provider statement:    Critical care time (minutes):  30   Critical care was necessary to treat or prevent imminent or life-threatening deterioration of the following conditions:  Circulatory failure   Critical care was time spent personally by me on the following activities:  Development of treatment plan with patient or surrogate, discussions with consultants, evaluation of patient's response to treatment, examination of patient, ordering and review of laboratory studies, ordering and review of radiographic studies, ordering and performing treatments and interventions, pulse oximetry, re-evaluation of patient's condition and review of old charts .1-3 Lead EKG Interpretation Performed by: Concha Se, MD Authorized by: Concha Se, MD  Interpretation: abnormal     ECG rate:  140   ECG rate assessment: tachycardic     Rhythm: atrial fibrillation     Ectopy: none     Conduction: normal     MEDICATIONS ORDERED IN ED: Medications  diltiazem (CARDIZEM) 125 mg in dextrose 5% 125 mL (1 mg/mL) infusion (5 mg/hr Intravenous New Bag/Given 09/09/21 1925)  acetaminophen (TYLENOL) tablet 650 mg (has no administration in time range)    Or  acetaminophen (TYLENOL) suppository 650 mg (has no administration in time range)  ondansetron (ZOFRAN) tablet 4 mg (has no administration in time range)    Or  ondansetron (ZOFRAN) injection 4 mg (has no administration in time range)  furosemide (LASIX) injection 60 mg (has no administration in time range)  LORazepam (ATIVAN) tablet 1-4 mg (has no administration in time range)    Or  LORazepam (ATIVAN) injection 1-4 mg  (has no administration in time range)  thiamine tablet 100 mg (has no administration in time range)    Or  thiamine (B-1) injection 100 mg (has no administration in time range)  folic acid (FOLVITE) tablet 1 mg (has no administration in time range)  multivitamin with minerals tablet 1 tablet (has no administration in time range)  diltiazem (CARDIZEM) injection 15 mg (15 mg Intravenous Given 09/09/21 1902)  furosemide (LASIX) injection 80 mg (80 mg Intravenous Given 09/09/21 2021)     IMPRESSION / MDM / ASSESSMENT AND PLAN / ED COURSE  I reviewed the triage vital signs and the nursing notes.  Differential diagnosis includes, but is not limited to, A. fib with RVR.  Concern for fluid overload.  Lower suspicion for ACS.  Labs evaluate for Electra abnormalities  Given patient with significant tachycardia with A. fib with RVR he reports chronic A. fib I will treat with a rate control and given a dose of IV diltiazem and started on a dill drip.  Patient looks like he needs IV diuresis given his significant leg swelling.  BMP shows kidney function slightly elevated from baseline LFTs show slightly elevated T bili similar to prior White count on CBC is normal no signs for infection Initial troponin is elevated but I suspect that its demand  Reevaluate patient feeling better after heart rate has come down. I consider transfer to [place] but patient is  on a dilt drip and I feel like it would be safer to admit patient here. Therefore patient would prefer to be admitted here anyways  The patient is on the cardiac monitor to evaluate for evidence of arrhythmia and/or significant heart rate changes.  FINAL CLINICAL IMPRESSION(S) / ED DIAGNOSES   Final diagnoses:  Atrial fibrillation with rapid ventricular response (HCC)  Acute on chronic congestive heart failure, unspecified heart failure type (HCC)     Rx / DC Orders   ED Discharge Orders     None        Note:  This document was prepared  using Dragon voice recognition software and may include unintentional dictation errors.   Concha Se, MD 09/09/21 2101

## 2021-09-09 NOTE — Assessment & Plan Note (Addendum)
-   Resumed home carvedilol 25 mg p.o. twice daily, losartan 25 mg daily, Imdur 30 mg daily - Patient is currently normal self, did not resume home hydralazine 50 mg every 8 hours - Resumed hydralazine 10 mg p.o. every 6 hours as needed for SBP greater than 180, 3 days coverage ordered

## 2021-09-10 ENCOUNTER — Encounter: Payer: Self-pay | Admitting: Obstetrics and Gynecology

## 2021-09-10 DIAGNOSIS — Z888 Allergy status to other drugs, medicaments and biological substances status: Secondary | ICD-10-CM | POA: Diagnosis not present

## 2021-09-10 DIAGNOSIS — N1831 Chronic kidney disease, stage 3a: Secondary | ICD-10-CM | POA: Diagnosis present

## 2021-09-10 DIAGNOSIS — E785 Hyperlipidemia, unspecified: Secondary | ICD-10-CM | POA: Diagnosis present

## 2021-09-10 DIAGNOSIS — I2510 Atherosclerotic heart disease of native coronary artery without angina pectoris: Secondary | ICD-10-CM | POA: Diagnosis present

## 2021-09-10 DIAGNOSIS — I5023 Acute on chronic systolic (congestive) heart failure: Secondary | ICD-10-CM | POA: Diagnosis not present

## 2021-09-10 DIAGNOSIS — Z833 Family history of diabetes mellitus: Secondary | ICD-10-CM | POA: Diagnosis not present

## 2021-09-10 DIAGNOSIS — Z7982 Long term (current) use of aspirin: Secondary | ICD-10-CM | POA: Diagnosis not present

## 2021-09-10 DIAGNOSIS — Z20822 Contact with and (suspected) exposure to covid-19: Secondary | ICD-10-CM | POA: Diagnosis present

## 2021-09-10 DIAGNOSIS — I5082 Biventricular heart failure: Secondary | ICD-10-CM | POA: Diagnosis present

## 2021-09-10 DIAGNOSIS — I4819 Other persistent atrial fibrillation: Secondary | ICD-10-CM | POA: Diagnosis present

## 2021-09-10 DIAGNOSIS — F1020 Alcohol dependence, uncomplicated: Secondary | ICD-10-CM | POA: Diagnosis present

## 2021-09-10 DIAGNOSIS — Z79899 Other long term (current) drug therapy: Secondary | ICD-10-CM | POA: Diagnosis not present

## 2021-09-10 DIAGNOSIS — F17210 Nicotine dependence, cigarettes, uncomplicated: Secondary | ICD-10-CM | POA: Diagnosis present

## 2021-09-10 DIAGNOSIS — Z7901 Long term (current) use of anticoagulants: Secondary | ICD-10-CM | POA: Diagnosis not present

## 2021-09-10 DIAGNOSIS — I428 Other cardiomyopathies: Secondary | ICD-10-CM | POA: Diagnosis present

## 2021-09-10 DIAGNOSIS — I509 Heart failure, unspecified: Secondary | ICD-10-CM | POA: Diagnosis present

## 2021-09-10 DIAGNOSIS — I130 Hypertensive heart and chronic kidney disease with heart failure and stage 1 through stage 4 chronic kidney disease, or unspecified chronic kidney disease: Secondary | ICD-10-CM | POA: Diagnosis present

## 2021-09-10 DIAGNOSIS — M7989 Other specified soft tissue disorders: Secondary | ICD-10-CM | POA: Diagnosis present

## 2021-09-10 DIAGNOSIS — E1122 Type 2 diabetes mellitus with diabetic chronic kidney disease: Secondary | ICD-10-CM | POA: Diagnosis present

## 2021-09-10 DIAGNOSIS — F1421 Cocaine dependence, in remission: Secondary | ICD-10-CM | POA: Diagnosis present

## 2021-09-10 DIAGNOSIS — Z8249 Family history of ischemic heart disease and other diseases of the circulatory system: Secondary | ICD-10-CM | POA: Diagnosis not present

## 2021-09-10 LAB — CBG MONITORING, ED
Glucose-Capillary: 112 mg/dL — ABNORMAL HIGH (ref 70–99)
Glucose-Capillary: 111 mg/dL — ABNORMAL HIGH (ref 70–99)

## 2021-09-10 LAB — BASIC METABOLIC PANEL
Anion gap: 5 (ref 5–15)
BUN: 18 mg/dL (ref 8–23)
CO2: 29 mmol/L (ref 22–32)
Calcium: 8.8 mg/dL — ABNORMAL LOW (ref 8.9–10.3)
Chloride: 105 mmol/L (ref 98–111)
Creatinine, Ser: 1.63 mg/dL — ABNORMAL HIGH (ref 0.61–1.24)
GFR, Estimated: 47 mL/min — ABNORMAL LOW (ref >60)
Glucose, Bld: 123 mg/dL — ABNORMAL HIGH (ref 70–99)
Potassium: 4 mmol/L (ref 3.5–5.1)
Sodium: 139 mmol/L (ref 135–145)

## 2021-09-10 LAB — CBC
HCT: 41.6 % (ref 39.0–52.0)
Hemoglobin: 14.6 g/dL (ref 13.0–17.0)
MCH: 29.9 pg (ref 26.0–34.0)
MCHC: 35.1 g/dL (ref 30.0–36.0)
MCV: 85.2 fL (ref 80.0–100.0)
Platelets: 260 10*3/uL (ref 150–400)
RBC: 4.88 MIL/uL (ref 4.22–5.81)
RDW: 16.2 % — ABNORMAL HIGH (ref 11.5–15.5)
WBC: 9.3 10*3/uL (ref 4.0–10.5)
nRBC: 0 % (ref 0.0–0.2)

## 2021-09-10 LAB — GLUCOSE, CAPILLARY: Glucose-Capillary: 95 mg/dL (ref 70–99)

## 2021-09-10 LAB — VITAMIN B12: Vitamin B-12: 372 pg/mL (ref 180–914)

## 2021-09-10 MED ORDER — FUROSEMIDE 10 MG/ML IJ SOLN
60.0000 mg | Freq: Two times a day (BID) | INTRAMUSCULAR | Status: DC
  Administered 2021-09-10 – 2021-09-11 (×3): 60 mg via INTRAVENOUS
  Filled 2021-09-10: qty 8
  Filled 2021-09-10 (×2): qty 6

## 2021-09-10 MED ORDER — SODIUM CHLORIDE 0.9% FLUSH
3.0000 mL | Freq: Two times a day (BID) | INTRAVENOUS | Status: DC
  Administered 2021-09-10: 3 mL via INTRAVENOUS

## 2021-09-10 MED ORDER — INSULIN ASPART 100 UNIT/ML IJ SOLN: 0.0000 [IU] | Freq: Three times a day (TID) | INTRAMUSCULAR | Status: DC

## 2021-09-10 NOTE — Consult Note (Addendum)
° °  Heart Failure Nurse Navigator Note  HfrEF 30 to 35%.  Moderate LVH.  Mildly reduced right ventricular systolic function.  Mildly elevated pulmonary artery systolic pressures.  Mild to moderate tricuspid regurgitation.  Moderate left atrial enlargement.  He presented to the emergency room with complaints of increasing shortness of breath, dry cough worsening with lying down, bilateral leg swelling.  He was noted to be in atrial fibrillation with RVR.  BNP 526.  Chest x-ray revealed no acute cardiopulmonary disease.  Comorbidities:  Nonischemic cardiomyopathy Alcohol abuse Hypertension Hyperlipidemia Tobacco abuse Chronic kidney disease Persistent atrial fibrillation on anticoagulation  Medications:  Furosemide 60 mg IV twice a day Atorvastatin 80 mg at at bedtime Carvedilol 25 mg with meals Isosorbide mononitrate 30 mg daily Losartan 25 mg daily Apixaban 5 mg twice a day  Labs:  Sodium 139, potassium 4, chloride 105, CO2 29, BUN 18, creatinine 1.63, hemoglobin 14.6, crit 41.6. Weight is 95.3 kg Blood pressure 112/85   Initial meeting with patient in the emergency room, he is lying on a gurney in no acute distress.  His wife is not present at this time.  He said he is now living with his wife and grandson.  He was last admitted to [facility] in July 2022 and was hospitalized at the [place] in [place] in November 2022 for heart failure.  He states at that time that they diuresed him and got his weight down to 200 pounds.  He admitted to not weighing daily, when questioned he states that his scale is either broken or the battery needs to be replaced.  Stressed the importance of daily weights and reporting 2 to 3 pound weight gain overnight or 5 pounds within a week or any changes in his symptoms.  Went over how his heart function.  Given written information and pictures of heart failure.  Discussed  his fluid restriction, he states he realizes he is drinking  more than he should be, agreed that he is probably drinking  3 times what he should be drinking.  Limit to eight 8 ounce cups/64 ounces daily.  States that he gets thirsty, but gave him a of list of things to do to when thirsty without overdoing it with the fluids.  States that he has been compliant with his medications as he gets those through the [place].   He states that he is following up with cardiology at the [place] in [place].  So suggested that he get with Ms. Hackney in the outpatient heart failure clinic as he lives here in [place] and should the need arise would be easier to get into see her and it could be done quickly.  He voices understanding.  He has an appointment on February 9 at 2:30 in the afternoon.  He has a 26% ratio of no-shows.  7 out of 27 appointments.  Tresa Endo RN [facility]

## 2021-09-10 NOTE — ED Notes (Signed)
Patient with oxygen saturation of 80% while sleeping, saturation increased to 95% when awoken by alarms. 2L per [place] applied. Patient denies increased shortness of breath, states he is feeling better than when he first came to ED.

## 2021-09-10 NOTE — Progress Notes (Addendum)
PROGRESS NOTE    Gregory Howell  [id number] DOB: 1961/07/08 DOA: 09/09/2021 [facility]: [facility]  Outpatient Specialists: [facility]    Brief Narrative:   Presented with lower extremity swelling and orthopnea and shortness of breath, admitted for CHF exacerbation, also found to be in a fib with rvr.   Assessment & Plan:   Principal Problem:   Acute exacerbation of CHF (congestive heart failure) (HCC) Active Problems:   Tobacco abuse   Atrial fibrillation with RVR (HCC)   CKD (chronic kidney disease), stage III (HCC)   Persistent atrial fibrillation (HCC)   NICM (nonischemic cardiomyopathy) (HCC)   Volume overload state of heart   Benign essential hypertension   Cocaine dependence in remission ([place])   Alcohol dependence ([place])   Diabetes mellitus type 2, noninsulin dependent ([place])   CHF exacerbation ([place])   # HFrEF with acute exacerbation History nicm, ef last year 30-35%, biventricular chf. Here with signs/symptoms chf exacerbation. Responding to lasix. Placed on O2 in the ED but off o2 during eval by me O2 remains wnl. Hx poor compliance which has hindered institution of additional evidence-based meds - continue lasix 60 mg iv bid - cont home coreg, imdur - monitor O2 - strict I/os - maintain tele  # a-fib with RVR Started on dilt gtt overnight now weaned off - cont home coreg 25 bid - cont home eliquis  # Elevated troponin 224, 226 on repeat. No overt ishemic changes on ekg, no chest pain. Pt has hx of non-obstructive cad on cath. Think this likely 2/2 demand from chf, rfr, also in setting of ckd 3 - monitor  # HTN Here bp wnl - cont home losartan, coreg, imdur  # T2DM Here glucose wnl - SSI, hold home orals  # CKD3 Stable - monitor  # History alcohol abuse Patient denies recent heavy drinking, denies history of withdrawal - monitor    DVT prophylaxis: eliquis Code Status: full Family Communication: none @ bedside  Level of  care: Telemetry Cardiac Status is: Inpatient Remains inpatient appropriate because: severity of illness  Planned Discharge Destination: Home          Consultants:  none  Procedures: none  Antimicrobials:  none    Subjective: This morning sob and swelling patient says improved, no chest pain or cough  Objective: Vitals:   09/10/21 0700 09/10/21 0719 09/10/21 0730 09/10/21 0738  BP: 108/83  (!) 121/98   Pulse: 73 86  63  Resp:      Temp:      TempSrc:      SpO2:  93%  100%  Weight:      Height:        Intake/Output Summary (Last 24 hours) at 09/10/2021 0810 Last data filed at 09/09/2021 2310 Gross per 24 hour  Intake --  Output 1550 ml  Net -1550 ml   Filed Weights   09/09/21 1827 09/09/21 2100  Weight: 95.3 kg 95.3 kg    Examination:  General exam: Appears calm and comfortable  Respiratory system: rales at bases, few scattered rhonchi Cardiovascular system: S1 & S2 heard, irreg irreg, soft systolic murmur Gastrointestinal system: Abdomen is nondistended, soft and nontender. No organomegaly or masses felt. Normal bowel sounds heard. Central nervous system: Alert and oriented. No focal neurological deficits. Extremities: Symmetric 5 x 5 power. Pitting edema to thigns Skin: No rashes, lesions or ulcers Psychiatry: Judgement and insight appear normal. Mood & affect appropriate.     Data Reviewed: I have  personally reviewed following labs and imaging studies  CBC: Recent Labs  Lab 09/09/21 1829 09/10/21 0558  WBC 8.5 9.3  HGB 15.0 14.6  HCT 42.8 41.6  MCV 85.9 85.2  PLT 270 [id number]   Basic Metabolic Panel: Recent Labs  Lab 09/09/21 1829 09/09/21 1841 09/09/21 2141 09/10/21 0558  NA 137  --   --  139  K 4.0  --   --  4.0  CL 102  --   --  105  CO2 26  --   --  29  GLUCOSE 105*  --   --  123*  BUN 17  --   --  18  CREATININE 1.62*  --   --  1.63*  CALCIUM 8.7*  --   --  8.8*  MG  --  1.9  --   --   PHOS  --   --  4.0  --     GFR: Estimated Creatinine Clearance: 53.5 mL/min (A) (by C-G formula based on SCr of 1.63 mg/dL (H)). Liver Function Tests: Recent Labs  Lab 09/09/21 1841  AST 35  ALT 33  ALKPHOS 148*  BILITOT 1.5*  PROT 7.1  ALBUMIN 3.3*   No results for input(s): LIPASE, AMYLASE in the last 168 hours. No results for input(s): AMMONIA in the last 168 hours. Coagulation Profile: Recent Labs  Lab 09/09/21 1829  INR 1.2   Cardiac Enzymes: No results for input(s): CKTOTAL, CKMB, CKMBINDEX, TROPONINI in the last 168 hours. BNP (last 3 results) No results for input(s): PROBNP in the last 8760 hours. HbA1C: No results for input(s): HGBA1C in the last 72 hours. CBG: Recent Labs  Lab 09/09/21 2235 09/10/21 0738  GLUCAP 107* 112*   Lipid Profile: No results for input(s): CHOL, HDL, LDLCALC, TRIG, CHOLHDL, LDLDIRECT in the last 72 hours. Thyroid Function Tests: Recent Labs    09/09/21 2141  TSH 3.069   Anemia Panel: Recent Labs    09/09/21 2141  VITAMINB12 372   Urine analysis:    Component Value Date/Time   COLORURINE YELLOW 09/09/2021 1917   APPEARANCEUR CLEAR 09/09/2021 1917   LABSPEC 1.010 09/09/2021 1917   PHURINE 6.0 09/09/2021 1917   GLUCOSEU NEGATIVE 09/09/2021 [place] NEGATIVE 09/09/2021 [place] 09/09/2021 [place] 09/09/2021 1917   PROTEINUR 100 (A) 09/09/2021 1917   NITRITE NEGATIVE 09/09/2021 1917   LEUKOCYTESUR NEGATIVE 09/09/2021 1917   Sepsis Labs: @LABRCNTIP (procalcitonin:4,lacticidven:4)  ) Recent Results (from the past 240 hour(s))  Resp Panel by RT-PCR (Flu A&B, Covid) Nasopharyngeal Swab     Status: None   Collection Time: 09/09/21  7:17 PM   Specimen: Nasopharyngeal Swab; Nasopharyngeal(NP) swabs in vial transport medium  Result Value Ref Range Status   SARS Coronavirus 2 by RT PCR NEGATIVE NEGATIVE Final    Comment: (NOTE) SARS-CoV-2 target nucleic acids are NOT DETECTED.  The SARS-CoV-2 RNA is  generally detectable in upper respiratory specimens during the acute phase of infection. The lowest concentration of SARS-CoV-2 viral copies this assay can detect is 138 copies/mL. A negative result does not preclude SARS-Cov-2 infection and should not be used as the sole basis for treatment or other patient management decisions. A negative result may occur with  improper specimen collection/handling, submission of specimen other than nasopharyngeal swab, presence of viral mutation(s) within the areas targeted by this assay, and inadequate number of viral copies(<138 copies/mL). A negative result must be combined with clinical observations, patient history, and epidemiological information.  The expected result is Negative.  Fact Sheet for Patients:  EntrepreneurPulse.com.au  Fact Sheet for Healthcare Providers:  IncredibleEmployment.be  This test is no t yet approved or cleared by the [place] FDA and  has been authorized for detection and/or diagnosis of SARS-CoV-2 by FDA under an Emergency Use Authorization (EUA). This EUA will remain  in effect (meaning this test can be used) for the duration of the COVID-19 declaration under Section 564(b)(1) of the Act, 21 U.S.C.section 360bbb-3(b)(1), unless the authorization is terminated  or revoked sooner.       Influenza A by PCR NEGATIVE NEGATIVE Final   Influenza B by PCR NEGATIVE NEGATIVE Final    Comment: (NOTE) The Xpert Xpress SARS-CoV-2/FLU/RSV plus assay is intended as an aid in the diagnosis of influenza from Nasopharyngeal swab specimens and should not be used as a sole basis for treatment. Nasal washings and aspirates are unacceptable for Xpert Xpress SARS-CoV-2/FLU/RSV testing.  Fact Sheet for Patients: EntrepreneurPulse.com.au  Fact Sheet for Healthcare Providers: IncredibleEmployment.be  This test is not yet approved or cleared by the [place] FDA and has been authorized for detection and/or diagnosis of SARS-CoV-2 by FDA under an Emergency Use Authorization (EUA). This EUA will remain in effect (meaning this test can be used) for the duration of the COVID-19 declaration under Section 564(b)(1) of the Act, 21 U.S.C. section 360bbb-3(b)(1), unless the authorization is terminated or revoked.  Performed at [facility], [address] [place], [place] [address]          Radiology Studies: DG Chest 2 View  Result Date: 09/09/2021 CLINICAL DATA:  A 61 year old male present is for evaluation of shortness of breath. EXAM: CHEST - 2 VIEW COMPARISON:  May 27, 2021. FINDINGS: Cardiomediastinal contours and hilar structures are stable. No sign of consolidation, or pleural effusion. Mild interstitial prominence is a stable finding. No visible pneumothorax. On limited assessment there is no acute skeletal process. IMPRESSION: Stable chronic mild interstitial prominence, no acute cardiopulmonary disease. Electronically Signed   By: Zetta Bills M.D.   On: 09/09/2021 18:50        Scheduled Meds:  apixaban  5 mg Oral BID   atorvastatin  80 mg Oral QHS   carvedilol  25 mg Oral BID WC   folic acid  1 mg Oral Daily   furosemide  60 mg Intravenous Daily   insulin aspart  0-15 Units Subcutaneous TID WC   insulin aspart  0-5 Units Subcutaneous QHS   isosorbide mononitrate  30 mg Oral Daily   losartan  25 mg Oral Daily   multivitamin with minerals  1 tablet Oral Daily   thiamine  100 mg Oral Daily   Or   thiamine  100 mg Intravenous Daily   traZODone  100 mg Oral QHS   Continuous Infusions:  diltiazem (CARDIZEM) infusion Stopped (09/10/21 0347)     LOS: 0 days    Time spent: 40 min    Desma Maxim, MD [facility]   If 7PM-7AM, please contact night-coverage [email or web address] Password [email or web address] 09/10/2021, 8:10 AM

## 2021-09-11 DIAGNOSIS — I5023 Acute on chronic systolic (congestive) heart failure: Secondary | ICD-10-CM | POA: Diagnosis not present

## 2021-09-11 LAB — HEMOGLOBIN A1C
Hgb A1c MFr Bld: 5.6 % (ref 4.8–5.6)
Mean Plasma Glucose: 114 mg/dL

## 2021-09-11 LAB — BASIC METABOLIC PANEL
Anion gap: 9 (ref 5–15)
BUN: 25 mg/dL — ABNORMAL HIGH (ref 8–23)
CO2: 27 mmol/L (ref 22–32)
Calcium: 8.9 mg/dL (ref 8.9–10.3)
Chloride: 100 mmol/L (ref 98–111)
Creatinine, Ser: 1.7 mg/dL — ABNORMAL HIGH (ref 0.61–1.24)
GFR, Estimated: 45 mL/min — ABNORMAL LOW (ref >60)
Glucose, Bld: 100 mg/dL — ABNORMAL HIGH (ref 70–99)
Potassium: 3.8 mmol/L (ref 3.5–5.1)
Sodium: 136 mmol/L (ref 135–145)

## 2021-09-11 MED ORDER — CYCLOBENZAPRINE HCL 5 MG PO TABS: 5.0000 mg | ORAL_TABLET | Freq: Three times a day (TID) | ORAL | 0 refills | Status: DC | PRN

## 2021-09-11 MED ORDER — FUROSEMIDE 40 MG PO TABS: 40.0000 mg | ORAL_TABLET | Freq: Every day | ORAL | 1 refills | Status: DC

## 2021-09-11 NOTE — Discharge Summary (Signed)
Gregory Howell AUQ:[id number] DOB: Nov 25, 1960 DOA: 09/09/2021  PCP: [facility]  Admit date: 09/09/2021 Discharge date: 09/11/2021  Time spent: 35 minutes  Recommendations for Outpatient Follow-up:  Heart failure clinic f/u scheduled F/u cardiology 1-2 weeks [facility] p[facility] f/u      Discharge Diagnoses:  Principal Problem:   Acute exacerbation of CHF (congestive heart failure) ([place]) Active Problems:   Tobacco abuse   Atrial fibrillation with RVR (HCC)   CKD (chronic kidney disease), stage III ([place])   Persistent atrial fibrillation (HCC)   NICM (nonischemic cardiomyopathy) ([place])   Volume overload state of heart   Benign essential hypertension   Cocaine dependence in remission ([place])   Alcohol dependence ([place])   CHF exacerbation ([place])   Discharge Condition: stable  Diet recommendation: heart healthy  Filed Weights   09/09/21 1827 09/09/21 2100 09/11/21 0500  Weight: 95.3 kg 95.3 kg 97 kg    History of present illness:  From admission h and p 61 year old male with history of nonischemic cardiomyopathy, alcohol abuse, tobacco dependence, hypertension, hyperlipidemia, history of CKD 2/3, CAD, persistent atrial fibrillation on Eliquis, who presents emergency department for chief concerns of shortness of breath.   Vitals in the emergency department showed temperature of 98.8, respiration rate of 20, heart rate of 139 initially, blood pressure 160/104, SPO2 of 98% on room air.   Labs in the emergency department showed serum sodium 137, potassium 4.0, chloride 102, bicarb 26, BUN of 17, serum creatinine of 1.62, nonfasting blood glucose 105, GFR 48.     WBC was 8.5, hemoglobin was 15, platelets of 270.   Alk phos was elevated at 148, BNP elevated at 526.2, high sensitive troponin was 224.  COVID/influenza A/influenza B PCR were negative.   In the emergency department patient received furosemide 80 mg IV, diltiazem 15 mg IV one-time dose and then patient was started on  diltiazem GGT.   At bedside, he is able to tell me his age, name, location.    He reports shortness of breath that is worse about 1 week ago. He states it is worse when he lays flat. He states he sleep in chair. He endorses swelling of his lower extremities. He denies trauma, chest pain, known weight gain.   Hospital Course:  # HFrEF with acute exacerbation History nicm, ef last year 30-35%, biventricular chf. Here with signs/symptoms chf exacerbation. Did not require O2. Responded to IV lasix. Hx poor compliance which has hindered institution of additional evidence-based meds. Here ambulated without difficulty. - resume home meds at discharge - CHF clinic f/u scheduled - advising close cardiology f/u, last seen a year ago   # a-fib with RVR Resolved promptly - cont home coreg 25 bid - cont home eliquis   # Elevated troponin 224, 226 on repeat. No overt ishemic changes on ekg, no chest pain. Pt has hx of non-obstructive cad on cath. Think this likely 2/2 demand from chf, rfr, also in setting of ckd 3  # HTN Here bp wnl - continued home losartan, coreg, imdur   # CKD3 Stable - monitor   # History alcohol abuse Patient denies recent heavy drinking, denies history of withdrawal    Procedures: none   Consultations: none  Discharge Exam: Vitals:   09/11/21 0500 09/11/21 0720  BP:  (!) 146/123  Pulse:  96  Resp:    Temp:    SpO2: 93% 90%    General: NAD Cardiovascular: RRR, soft systolic murmur Respiratory: CTAB Abdomen: soft, non-tender  Discharge Instructions ° ° °Discharge Instructions   ° ° Diet - low sodium heart healthy   Complete by: As directed °  ° Increase activity slowly   Complete by: As directed °  ° °  ° °Allergies as of 09/11/2021   ° °   Reactions  ° Lisinopril   ° Other reaction(s): ANGIOEDEMA OF LIPS  ° °  ° °  °Medication List  °  ° °STOP taking these medications   ° °aspirin 81 MG EC tablet °  ° °  ° °TAKE these medications   ° °apixaban 5 MG Tabs  tablet °Commonly known as: ELIQUIS °Take 5 mg by mouth 2 (two) times daily. °  °atorvastatin 80 MG tablet °Commonly known as: LIPITOR °Take 80 mg by mouth at bedtime. °  °carvedilol 25 MG tablet °Commonly known as: COREG °Take 25 mg by mouth 2 (two) times daily. °  °cyclobenzaprine 5 MG tablet °Commonly known as: FLEXERIL °Take 1 tablet (5 mg total) by mouth 3 (three) times daily as needed. °What changed: reasons to take this °  °empagliflozin 10 MG Tabs tablet °Commonly known as: JARDIANCE °Take 10 mg by mouth daily. °  °furosemide 40 MG tablet °Commonly known as: LASIX °Take 1 tablet (40 mg total) by mouth daily. °What changed: when to take this °  °hydrALAZINE 50 MG tablet °Commonly known as: APRESOLINE °Take 1 tablet (50 mg total) by mouth every 8 (eight) hours. °  °isosorbide mononitrate 30 MG 24 hr tablet °Commonly known as: IMDUR °TAKE ONE-HALF TABLET BY MOUTH ONCE EVERY DAY FOR HEART (DO NOT TAKE CIALIS, VIAGRA OR LEVITRA WHILE YOU ARE ON THIS MEDICATION). °  °losartan 25 MG tablet °Commonly known as: COZAAR °Take 25 mg by mouth daily. °  °traZODone 100 MG tablet °Commonly known as: DESYREL °Take 100 mg by mouth at bedtime. °  ° °  ° °Allergies  °Allergen Reactions  ° Lisinopril   °  Other reaction(s): ANGIOEDEMA OF LIPS  ° ° ° ° °The results of significant diagnostics from this hospitalization (including imaging, microbiology, ancillary and laboratory) are listed below for reference.   ° °Significant Diagnostic Studies: °DG Chest 2 View ° °Result Date: 09/09/2021 °CLINICAL DATA:  A 61-year-old male present is for evaluation of shortness of breath. EXAM: CHEST - 2 VIEW COMPARISON:  May 27, 2021. FINDINGS: Cardiomediastinal contours and hilar structures are stable. No sign of consolidation, or pleural effusion. Mild interstitial prominence is a stable finding. No visible pneumothorax. On limited assessment there is no acute skeletal process. IMPRESSION: Stable chronic mild interstitial prominence, no acute  cardiopulmonary disease. Electronically Signed   By: Geoffrey  Wile M.D.   On: 09/09/2021 18:50   ° °Microbiology: °Recent Results (from the past 240 hour(s))  °Resp Panel by RT-PCR (Flu A&B, Covid) Nasopharyngeal Swab     Status: None  ° Collection Time: 09/09/21  7:17 PM  ° Specimen: Nasopharyngeal Swab; Nasopharyngeal(NP) swabs in vial transport medium  °Result Value Ref Range Status  ° SARS Coronavirus 2 by RT PCR NEGATIVE NEGATIVE Final  °  Comment: (NOTE) °SARS-CoV-2 target nucleic acids are NOT DETECTED. ° °The SARS-CoV-2 RNA is generally detectable in upper respiratory °specimens during the acute phase of infection. The lowest °concentration of SARS-CoV-2 viral copies this assay can detect is °138 copies/mL. A negative result does not preclude SARS-Cov-2 °infection and should not be used as the sole basis for treatment or °other patient management decisions. A negative result may occur with  °improper specimen collection/handling,   submission of specimen other than nasopharyngeal swab, presence of viral mutation(s) within the areas targeted by this assay, and inadequate number of viral copies(<138 copies/mL). A negative result must be combined with clinical observations, patient history, and epidemiological information. The expected result is Negative.  Fact Sheet for Patients:  EntrepreneurPulse.com.au  Fact Sheet for Healthcare Providers:  IncredibleEmployment.be  This test is no t yet approved or cleared by the [place] FDA and  has been authorized for detection and/or diagnosis of SARS-CoV-2 by FDA under an Emergency Use Authorization (EUA). This EUA will remain  in effect (meaning this test can be used) for the duration of the COVID-19 declaration under Section 564(b)(1) of the Act, 21 U.S.C.section 360bbb-3(b)(1), unless the authorization is terminated  or revoked sooner.       Influenza A by PCR NEGATIVE NEGATIVE Final   Influenza B by PCR  NEGATIVE NEGATIVE Final    Comment: (NOTE) The Xpert Xpress SARS-CoV-2/FLU/RSV plus assay is intended as an aid in the diagnosis of influenza from Nasopharyngeal swab specimens and should not be used as a sole basis for treatment. Nasal washings and aspirates are unacceptable for Xpert Xpress SARS-CoV-2/FLU/RSV testing.  Fact Sheet for Patients: EntrepreneurPulse.com.au  Fact Sheet for Healthcare Providers: IncredibleEmployment.be  This test is not yet approved or cleared by the [place] FDA and has been authorized for detection and/or diagnosis of SARS-CoV-2 by FDA under an Emergency Use Authorization (EUA). This EUA will remain in effect (meaning this test can be used) for the duration of the COVID-19 declaration under Section 564(b)(1) of the Act, 21 U.S.C. section 360bbb-3(b)(1), unless the authorization is terminated or revoked.  Performed at [facility], [address] [place], [place] [address]      Labs: Basic Metabolic Panel: Recent Labs  Lab 09/09/21 1829 09/09/21 1841 09/09/21 2141 09/10/21 0558 09/11/21 0631  NA 137  --   --  139 136  K 4.0  --   --  4.0 3.8  CL 102  --   --  105 100  CO2 26  --   --  29 27  GLUCOSE 105*  --   --  123* 100*  BUN 17  --   --  18 25*  CREATININE 1.62*  --   --  1.63* 1.70*  CALCIUM 8.7*  --   --  8.8* 8.9  MG  --  1.9  --   --   --   PHOS  --   --  4.0  --   --    Liver Function Tests: Recent Labs  Lab 09/09/21 1841  AST 35  ALT 33  ALKPHOS 148*  BILITOT 1.5*  PROT 7.1  ALBUMIN 3.3*   No results for input(s): LIPASE, AMYLASE in the last 168 hours. No results for input(s): AMMONIA in the last 168 hours. CBC: Recent Labs  Lab 09/09/21 1829 09/10/21 0558  WBC 8.5 9.3  HGB 15.0 14.6  HCT 42.8 41.6  MCV 85.9 85.2  PLT 270 260   Cardiac Enzymes: No results for input(s): CKTOTAL, CKMB, CKMBINDEX, TROPONINI in the last 168 hours. BNP: BNP (last 3  results) Recent Labs    05/22/21 2252 05/27/21 1041 09/09/21 1829  BNP 660.4* 717.8* 526.2*    ProBNP (last 3 results) No results for input(s): PROBNP in the last 8760 hours.  CBG: Recent Labs  Lab 09/09/21 2235 09/10/21 0738 09/10/21 1224 09/10/21 1636  GLUCAP 107* 112* 111* 95       Signed:  Noah B Wouk MD.  °[facility] °09/11/2021, 11:11 AM ° °

## 2021-09-11 NOTE — TOC Transition Note (Addendum)
Transition of Care [facility]) - CM/SW Discharge Note   Patient Details  Name: Gregory Howell MRN: [id number] Date of Birth: 02-09-1961  [facility] CM/SW Contact:  Candie Chroman, LCSW Phone Number: 09/11/2021, 11:50 AM   Clinical Narrative: Patient has orders to discharge home today. Readmission prevention screen complete. CSW met with patient. No supports at bedside. CSW introduced role and explained that discharge planning would be discussed. PCP is at the [facility]. He says his provider is [facility]. He only uses the [facility]. No issues obtaining medications. No home health prior to admission. He uses a 3-prong cane and RW at home. Patient will need transport home. Prefers Uber/Lyft over door-to-door. He said his wife can help him when he arrives if needed. Going to [address] [facility] [address], [place], [place] [address]. Patient declined SA resources. He said he is already getting counseling at the [place] for smoking and drinking. Heart failure nurse navigator met with patient yesterday. No further concerns. Asked RN to let me know when patient is ready for transport to be set up.  1:15 pm: Melburn Popper arranged to take patient home. Will be here around 1:20. No further concerns. CSW signing off.  Final next level of care: Home/Self Care Barriers to Discharge: No Barriers Identified   Patient Goals and CMS Choice        Discharge Placement                Patient to be transferred to facility by: Uber/Lyft   Patient and family notified of of transfer: 09/11/21  Discharge Plan and Services                                     Social Determinants of Health (SDOH) Interventions     Readmission Risk Interventions Readmission Risk Prevention Plan 09/11/2021  Transportation Screening Complete  PCP or Specialist Appt within 3-5 Days Complete  Social Work Consult for [place] Planning/Counseling Complete  Palliative Care Screening Not Applicable  Medication  Review Press photographer) Complete  Some recent data might be hidden

## 2021-09-11 NOTE — Progress Notes (Signed)
Heart Failure Nurse Navigator Note   Met with patient today, his demeanor appeared to be a little depressed, I asked him if there was something I could help with he states that he just had not slept well and was tired.  Went over with him again today importance of daily weights and what to report.  Sticking with the fluid restriction of no more than 64 ounces in a 24-hour period. also discussed and stressed the importance of following up in the outpatient heart failure clinic.  He voices understanding.  Was also given an instruction sheet on how to combat thirst other than drinking a lot of fluids.  Pricilla Riffle RN [facility]

## 2021-09-11 NOTE — Plan of Care (Signed)
No voiced complaints. No distress noted.  Remains on room air.  Atrial fib/flutter noted on telemetry.  Problem: Education: Goal: Ability to demonstrate management of disease process will improve Outcome: Progressing Goal: Ability to verbalize understanding of medication therapies will improve Outcome: Progressing Goal: Individualized Educational Video(s) Outcome: Progressing   Problem: Activity: Goal: Capacity to carry out activities will improve Outcome: Progressing   Problem: Cardiac: Goal: Ability to achieve and maintain adequate cardiopulmonary perfusion will improve Outcome: Progressing   Problem: Education: Goal: Knowledge of General Education information will improve Description: Including pain rating scale, medication(s)/side effects and non-pharmacologic comfort measures Outcome: Progressing   Problem: Health Behavior/Discharge Planning: Goal: Ability to manage health-related needs will improve Outcome: Progressing   Problem: Clinical Measurements: Goal: Ability to maintain clinical measurements within normal limits will improve Outcome: Progressing Goal: Will remain free from infection Outcome: Progressing Goal: Diagnostic test results will improve Outcome: Progressing Goal: Respiratory complications will improve Outcome: Progressing Goal: Cardiovascular complication will be avoided Outcome: Progressing   Problem: Activity: Goal: Risk for activity intolerance will decrease Outcome: Progressing   Problem: Nutrition: Goal: Adequate nutrition will be maintained Outcome: Progressing   Problem: Coping: Goal: Level of anxiety will decrease Outcome: Progressing   Problem: Elimination: Goal: Will not experience complications related to bowel motility Outcome: Progressing Goal: Will not experience complications related to urinary retention Outcome: Progressing   Problem: Pain Managment: Goal: General experience of comfort will improve Outcome:  Progressing   Problem: Safety: Goal: Ability to remain free from injury will improve Outcome: Progressing   Problem: Skin Integrity: Goal: Risk for impaired skin integrity will decrease Outcome: Progressing

## 2021-09-12 LAB — VITAMIN B1: Vitamin B1 (Thiamine): 126.4 nmol/L (ref 66.5–200.0)

## 2021-09-17 NOTE — Progress Notes (Deleted)
Patient ID: Gregory Howell, male    DOB: [birth year], 61 y.o.   MRN: [id number]  HPI  Gregory Howell is a 61 y/o male with a history of CAD, HTN, CKD, persistent atrial fibrillation, current tobacco use and chronic heart failure.   Echo report from 02/28/21 reviewed and showed an EF of 30-35% along with moderate LVH/ LAE, mild/moderate TR and mildly elevated PA pressure. Echo report from 04/05/20 reviewed and showed an EF of 40-45% along with moderate LVH, severe LAE, severe RAE and mildly elevated PA pressure.   RHC/LHC done 12/07/19 showed: 2nd Mrg lesion is 100% stenosed. Dist Cx lesion is 60% stenosed. 1st Mrg lesion is 40% stenosed. Ost RCA lesion is 50% stenosed. RPAV lesion is 60% stenosed. Mid RCA lesion is 30% stenosed. LV end diastolic pressure is mildly elevated. There is severe left ventricular systolic dysfunction. The left ventricular ejection fraction is less than 25% by visual estimate. Hemodynamic findings consistent with mild pulmonary hypertension.  Admitted 09/09/21 due to shortness of breath due to heart failure exacerbation along with atrial fibrillation. Given IV lasix with transition to oral diuretics. IV diltiazem initially given. Elevated troponin thought to be due to demand ischemia. Discharged after 2 days.   He presents today for a follow-up visit although hasn't been seen since December 2021. He presents with a chief complaint of    Past Medical History:  Diagnosis Date   CHF (congestive heart failure) ([place])    CKD (chronic kidney disease), stage II    Coronary artery disease, non-occlusive    HFrEF (heart failure with reduced ejection fraction) (HCC)    Hypertension    Hypertensive heart disease    NICM (nonischemic cardiomyopathy) ([place])    Persistent atrial fibrillation ([place])    Past Surgical History:  Procedure Laterality Date   RIGHT/LEFT HEART CATH AND CORONARY ANGIOGRAPHY N/A 12/07/2019   Procedure: RIGHT/LEFT HEART CATH AND CORONARY ANGIOGRAPHY;   Surgeon: Minna Merritts, MD;  Location: [facility];  Service: Cardiovascular;  Laterality: N/A;   Family History  Problem Relation Age of Onset   Hypertension Mother    Hypertension Father    CAD Brother    Diabetes Brother    Social History   Tobacco Use   Smoking status: Some Days    Packs/day: 0.50    Types: Cigarettes   Smokeless tobacco: Never  Substance Use Topics   Alcohol use: Yes    Alcohol/week: 7.0 - 14.0 standard drinks    Types: 7 - 14 Cans of beer per week    Comment: daily   Allergies  Allergen Reactions   Lisinopril     Other reaction(s): ANGIOEDEMA OF LIPS     Review of Systems  Constitutional:  Negative for appetite change.  HENT:  Negative for congestion, postnasal drip and sore throat.   Eyes: Negative.   Respiratory:  Negative for cough and chest tightness.   Cardiovascular:  Negative for leg swelling.  Gastrointestinal:  Negative for abdominal distention.  Endocrine: Negative.   Genitourinary: Negative.   Musculoskeletal:  Positive for back pain. Negative for neck pain.  Skin: Negative.   Allergic/Immunologic: Negative.   Neurological:  Negative for dizziness and light-headedness.  Hematological:  Negative for adenopathy. Does not bruise/bleed easily.  Psychiatric/Behavioral:  Positive for sleep disturbance (sleeping on no pillows). Negative for dysphoric mood. The patient is not nervous/anxious.      Physical Exam Vitals and nursing note reviewed.  Constitutional:      Appearance: Normal  appearance.  HENT:     Head: Normocephalic and atraumatic.  Cardiovascular:     Rate and Rhythm: Normal rate. Rhythm irregular.  Pulmonary:     Effort: Pulmonary effort is normal. No respiratory distress.     Breath sounds: No wheezing or rales.  Abdominal:     General: There is no distension.     Palpations: Abdomen is soft.     Tenderness: There is no abdominal tenderness.  Musculoskeletal:        General: No tenderness.      Cervical back: Normal range of motion and neck supple.     Right lower leg: No edema.     Left lower leg: No edema.  Skin:    General: Skin is warm and dry.  Neurological:     General: No focal deficit present.     Mental Status: He is alert and oriented to person, place, and time.  Psychiatric:        Mood and Affect: Mood is anxious.   Assessment & Plan:  1: Chronic heart failure with reduced ejection fraction- - NYHA class III - euvolemic today - weighing daily and says that his weight has been stable; reminded to call for an overnight weight gain of > 2 pounds or a weekly weight gain of > 5 pounds - not adding salt and has been trying to read food labels for sodium content; written dietary information given to him about a 2000mg  sodium diet - consider changing his losartan to entresto in the future/ currently getting RX through the [place]; consider also adding farxiga and or spironolactone - saw cardiology [name]) 08/23/20 - BNP 09/09/21 was 526.2   2: HTN- - BP - sees PCP at the [facility] - BMP 09/11/21 reviewed and showed sodium 136, potassium 3.8, creatinine 1.7 and GFR 45  3: Atrial fibrillation- - currently on apixaban BID - notices palpitations worsen when he's under stressful marital situation - smoking 2-3 cigarettes daily; complete cessation encouraged   Patient did not bring his medications nor a list. Each medication was verbally reviewed with the patient and he was encouraged to bring the bottles to every visit to confirm accuracy of list.

## 2021-09-18 ENCOUNTER — Telehealth: Payer: Self-pay | Admitting: Family

## 2021-09-18 ENCOUNTER — Ambulatory Visit: Payer: No Typology Code available for payment source | Admitting: Family

## 2021-09-18 NOTE — Telephone Encounter (Signed)
Patient did not show for his [facility] appointment on 09/18/21. Will attempt to reschedule.

## 2021-12-23 IMAGING — CR DG CHEST 2V
1 series · 2 of 2 positions shown · non-contrast
Comparison: PA and lateral chest 06/19/2020.

CLINICAL DATA: Increasing shortness of breath in a patient with a
history of congestive heart failure.

EXAM:
CHEST - 2 VIEW

[Series 1: w chest pa · 0.14mm/px · 2 of 2 slices shown]
[im 1/2]
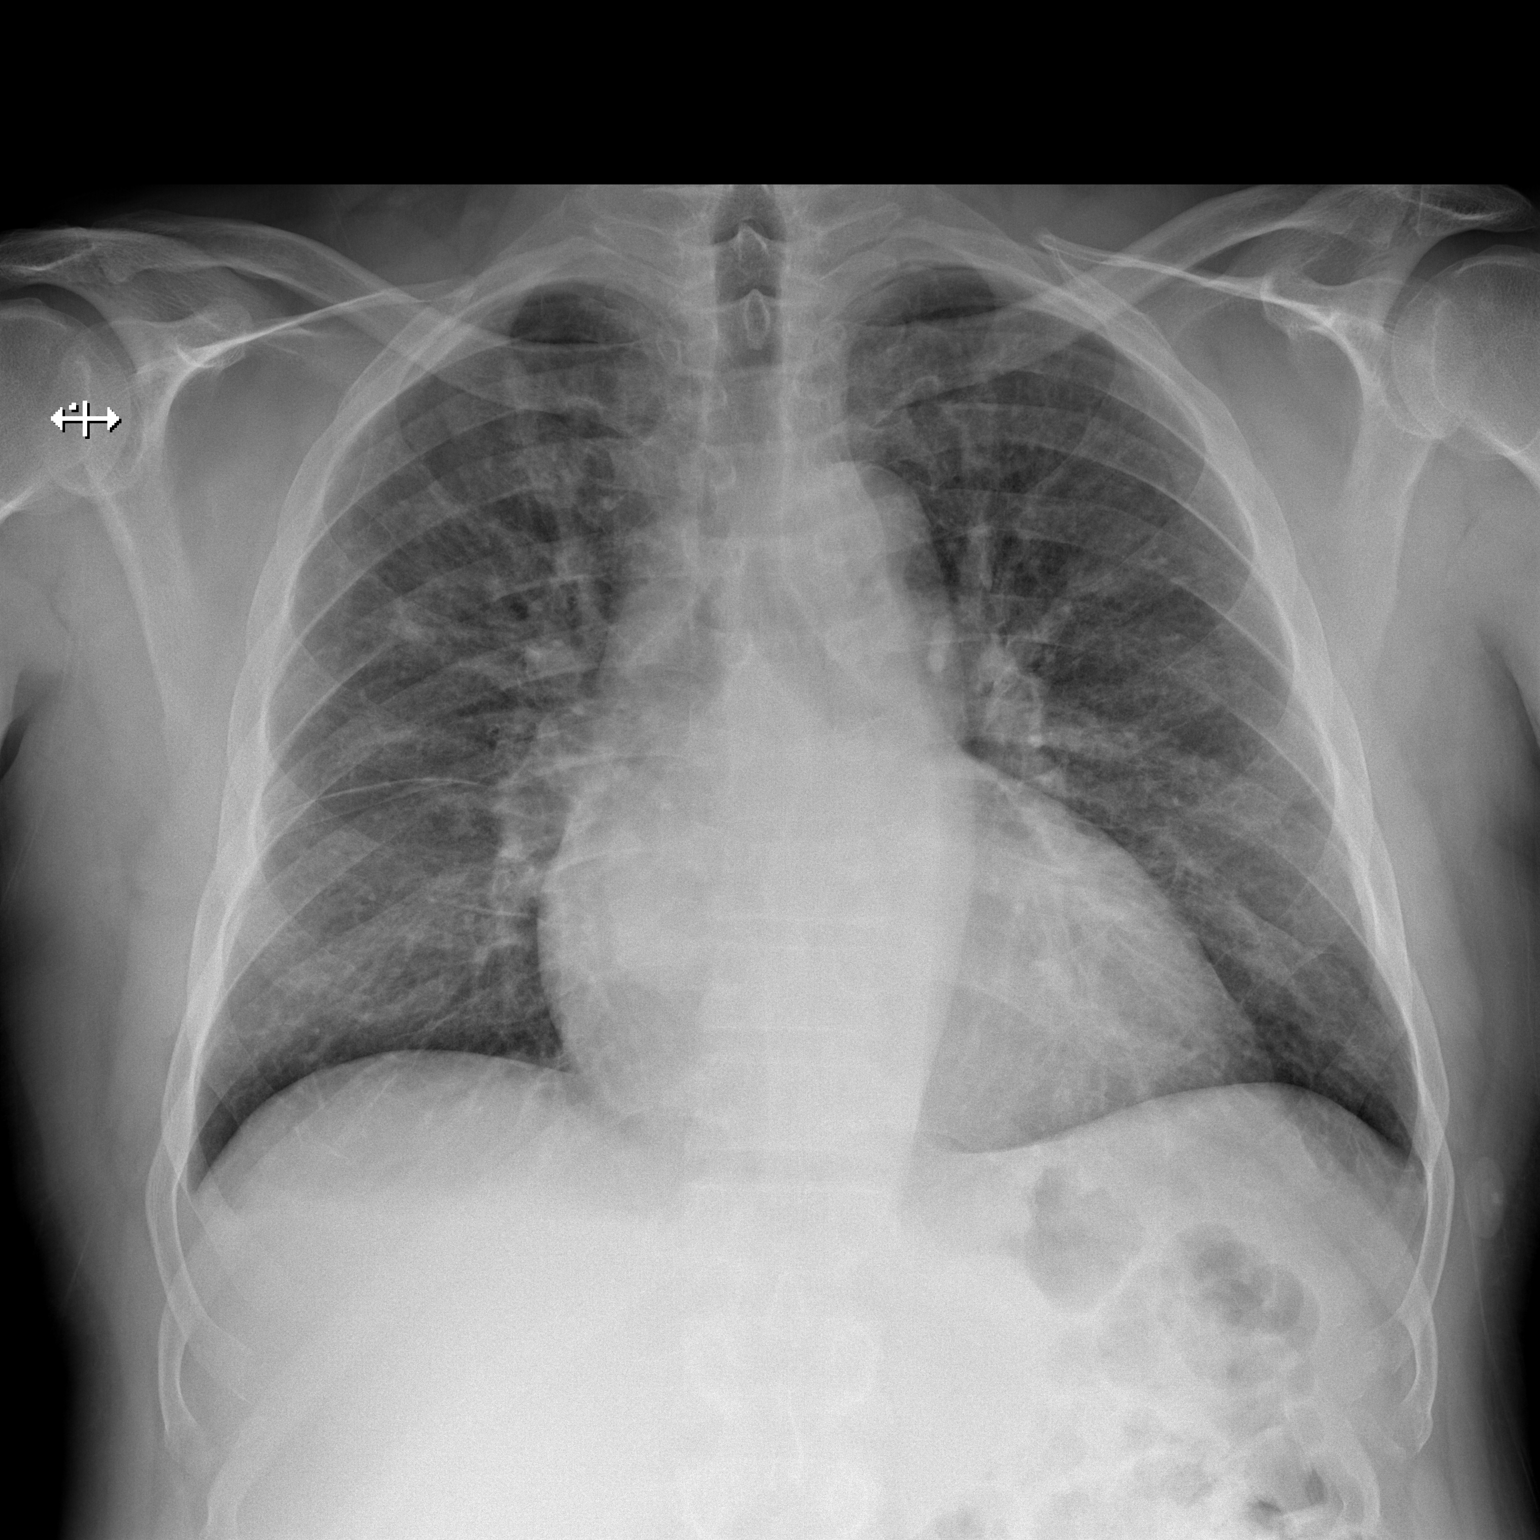
[im 2/2]
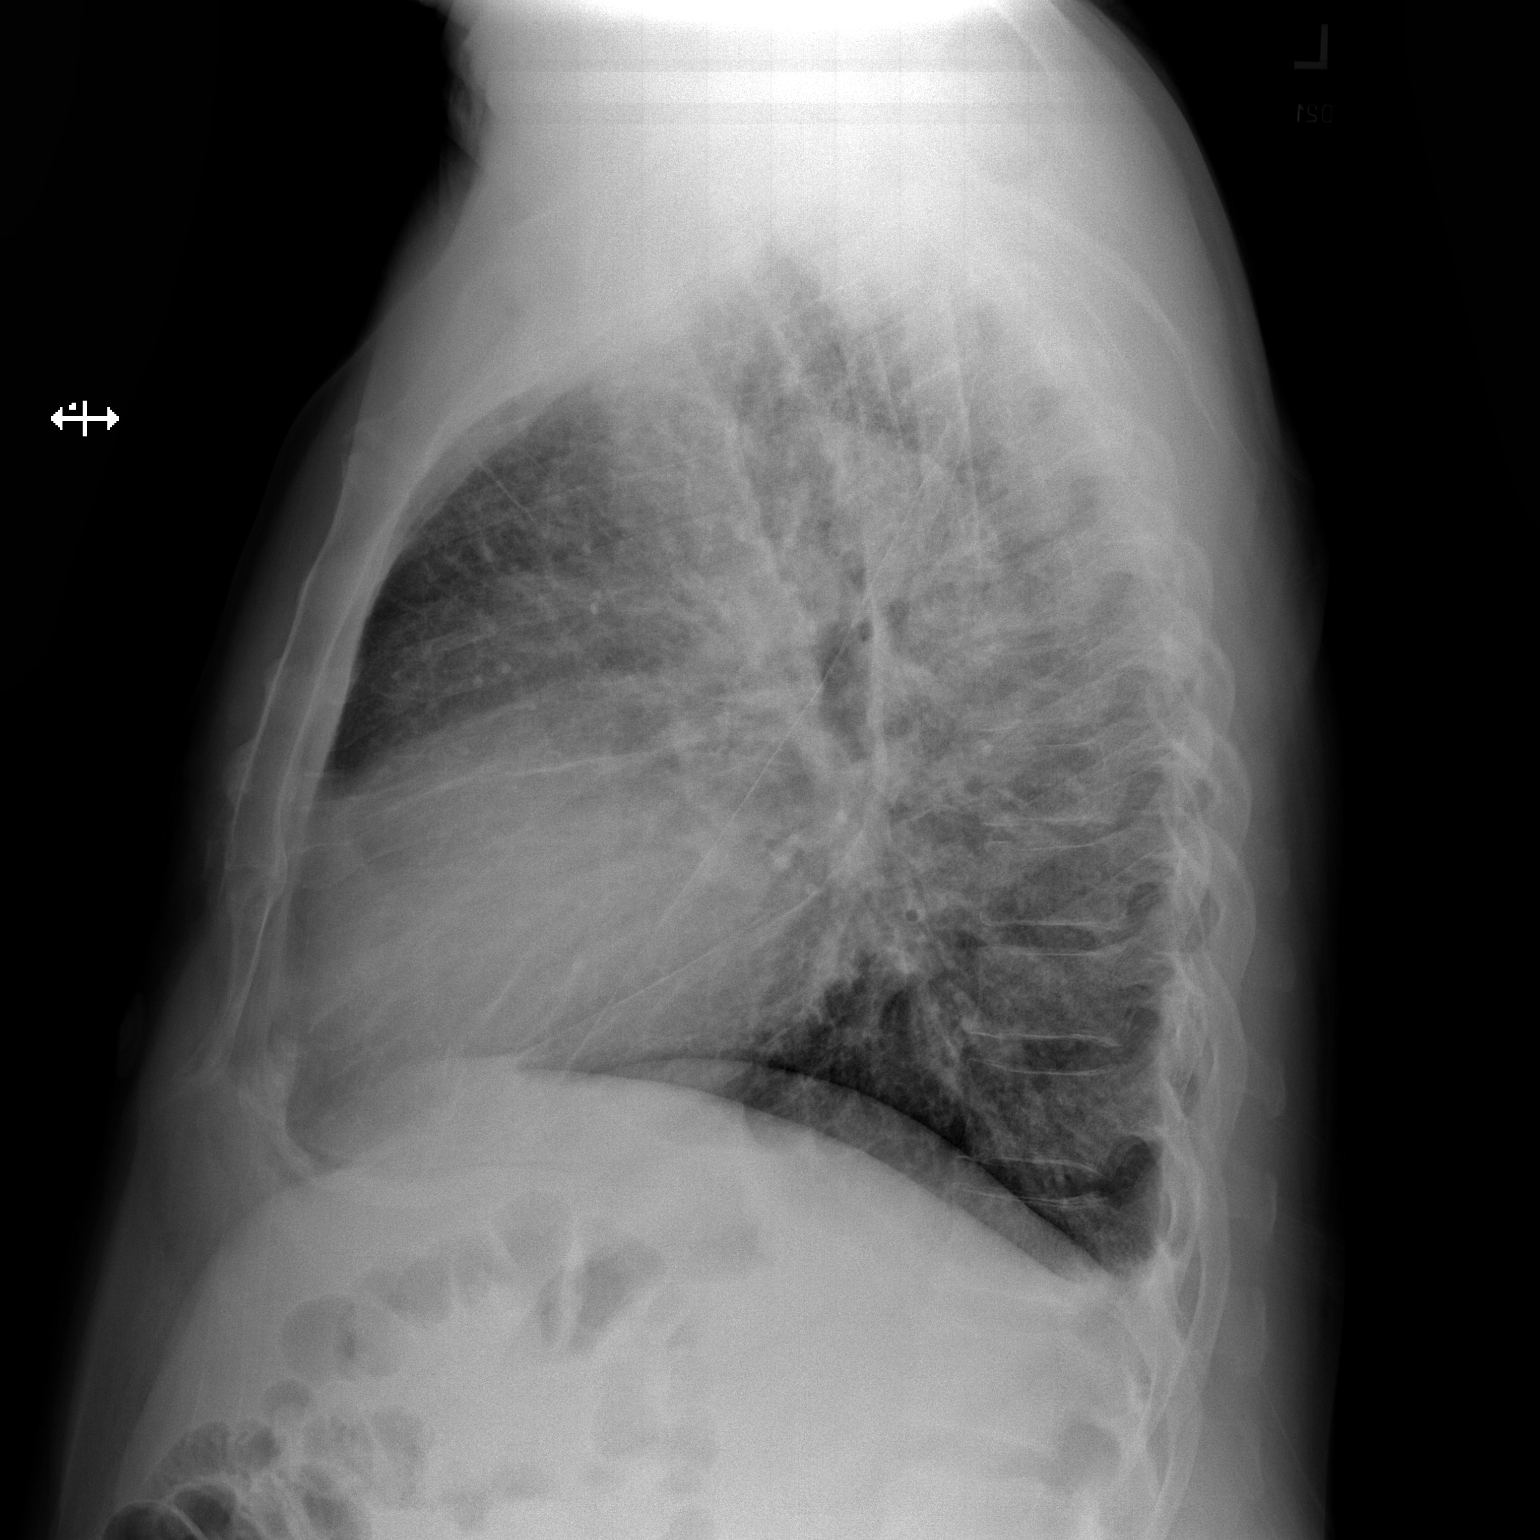

[2 of 2 positions shown; findings below may reference images not displayed]

FINDINGS: There is cardiomegaly mild interstitial edema. No consolidative
process. No pneumothorax or pleural fluid. No acute or focal bony
abnormality.
IMPRESSION: Cardiomegaly and mild interstitial edema.

## 2022-08-17 ENCOUNTER — Other Ambulatory Visit: Payer: Self-pay

## 2022-08-17 ENCOUNTER — Emergency Department
Admission: EM | Admit: 2022-08-17 | Discharge: 2022-08-17 | Disposition: A | Discharge status: Home / Self Care | Payer: No Typology Code available for payment source | Attending: Emergency Medicine | Admitting: Emergency Medicine

## 2022-08-17 ENCOUNTER — Emergency Department: Payer: No Typology Code available for payment source

## 2022-08-17 DIAGNOSIS — I5023 Acute on chronic systolic (congestive) heart failure: Secondary | ICD-10-CM

## 2022-08-17 DIAGNOSIS — R7989 Other specified abnormal findings of blood chemistry: Secondary | ICD-10-CM | POA: Diagnosis not present

## 2022-08-17 DIAGNOSIS — Z20822 Contact with and (suspected) exposure to covid-19: Secondary | ICD-10-CM | POA: Diagnosis not present

## 2022-08-17 DIAGNOSIS — Z7901 Long term (current) use of anticoagulants: Secondary | ICD-10-CM | POA: Diagnosis not present

## 2022-08-17 DIAGNOSIS — F17200 Nicotine dependence, unspecified, uncomplicated: Secondary | ICD-10-CM | POA: Diagnosis not present

## 2022-08-17 DIAGNOSIS — R0602 Shortness of breath: Secondary | ICD-10-CM | POA: Diagnosis present

## 2022-08-17 LAB — PROTIME-INR
INR: 1.1 (ref 0.8–1.2)
Prothrombin Time: 13.6 seconds (ref 11.4–15.2)

## 2022-08-17 LAB — BASIC METABOLIC PANEL
Anion gap: 8 (ref 5–15)
BUN: 20 mg/dL (ref 8–23)
CO2: 25 mmol/L (ref 22–32)
Calcium: 8.9 mg/dL (ref 8.9–10.3)
Chloride: 103 mmol/L (ref 98–111)
Creatinine, Ser: 1.54 mg/dL — ABNORMAL HIGH (ref 0.61–1.24)
GFR, Estimated: 50 mL/min — ABNORMAL LOW (ref >60)
Glucose, Bld: 99 mg/dL (ref 70–99)
Potassium: 4.2 mmol/L (ref 3.5–5.1)
Sodium: 136 mmol/L (ref 135–145)

## 2022-08-17 LAB — CBC
HCT: 43.7 % (ref 39.0–52.0)
Hemoglobin: 15.3 g/dL (ref 13.0–17.0)
MCH: 30.2 pg (ref 26.0–34.0)
MCHC: 35 g/dL (ref 30.0–36.0)
MCV: 86.4 fL (ref 80.0–100.0)
Platelets: 217 10*3/uL (ref 150–400)
RBC: 5.06 MIL/uL (ref 4.22–5.81)
RDW: 15.8 % — ABNORMAL HIGH (ref 11.5–15.5)
WBC: 10.9 10*3/uL — ABNORMAL HIGH (ref 4.0–10.5)
nRBC: 0 % (ref 0.0–0.2)

## 2022-08-17 LAB — BLOOD GAS, VENOUS
Acid-Base Excess: 4.2 mmol/L — ABNORMAL HIGH (ref 0.0–2.0)
Bicarbonate: 30.3 mmol/L — ABNORMAL HIGH (ref 20.0–28.0)
O2 Saturation: 21.3 %
Patient temperature: 37
pCO2, Ven: 50 mmHg (ref 44–60)
pH, Ven: 7.39 (ref 7.25–7.43)
pO2, Ven: <31 mmHg — CL (ref 32–45)

## 2022-08-17 LAB — TROPONIN I (HIGH SENSITIVITY)
Troponin I (High Sensitivity): 45 ng/L — ABNORMAL HIGH (ref <18)
Troponin I (High Sensitivity): 46 ng/L — ABNORMAL HIGH (ref <18)

## 2022-08-17 LAB — APTT: aPTT: 34 seconds (ref 24–36)

## 2022-08-17 LAB — BRAIN NATRIURETIC PEPTIDE: B Natriuretic Peptide: 717.3 pg/mL — ABNORMAL HIGH (ref 0.0–100.0)

## 2022-08-17 LAB — RESP PANEL BY RT-PCR (RSV, FLU A&B, COVID)  RVPGX2
Influenza A by PCR: NEGATIVE
Influenza B by PCR: NEGATIVE
Resp Syncytial Virus by PCR: NEGATIVE
SARS Coronavirus 2 by RT PCR: NEGATIVE

## 2022-08-17 MED ORDER — FUROSEMIDE 40 MG PO TABS: 80.0000 mg | ORAL_TABLET | Freq: Two times a day (BID) | ORAL | 0 refills | Status: DC

## 2022-08-17 MED ORDER — FUROSEMIDE 10 MG/ML IJ SOLN
40.0000 mg | Freq: Once | INTRAMUSCULAR | Status: AC
  Administered 2022-08-17: 40 mg via INTRAVENOUS
  Filled 2022-08-17: qty 4

## 2022-08-17 MED ORDER — APIXABAN 5 MG PO TABS: 5.0000 mg | ORAL_TABLET | Freq: Two times a day (BID) | ORAL | 0 refills | Status: AC

## 2022-08-17 MED ORDER — POTASSIUM CHLORIDE CRYS ER 10 MEQ PO TBCR: 10.0000 meq | EXTENDED_RELEASE_TABLET | Freq: Two times a day (BID) | ORAL | 0 refills | Status: DC

## 2022-08-17 NOTE — Discharge Instructions (Signed)
You were seen in the emergency for shortness of breath.  Concern it is a heart failure exacerbation.  You were given a extra dose of IV Lasix in the emergency department.  Increase your Lasix from 40 mg twice a day to 80 mg twice a day for the next 3 days or until you are able to be evaluated by cardiology.  You were given potassium with your Lasix.  Your Eliquis was refilled.  Watch your salt intake.  Continue to cut back on alcohol  Return to the emergency department for any worsening shortness of breath.  You were given a cardiac referral to see a cardiologist in the next couple of days.  Follow-up with your cardiologist.

## 2022-08-17 NOTE — ED Triage Notes (Signed)
C/O breathing hard, Dyspnea with exertion.  Has history of afib.  Had similar episode one year ago and 'heart was out of rhythm'.  Symptoms have been ongoing x 1 week.  AAOx3.  Skin warm and dry.  DOE noted.  Voice clear and strong.

## 2022-08-17 NOTE — ED Provider Notes (Signed)
[facility] Provider Note    Event Date/Time   First MD Initiated Contact with Patient 08/17/22 1536     (approximate)   History   No chief complaint on file.   HPI  Gregory Howell is a 62 y.o. male past medical history significant for CHF, atrial fibrillation on Eliquis, alcohol abuse, presents to the emergency department with shortness of breath.  States that he was walking out to his front yard to take out the trash today and as he was walking back had severe shortness of breath and felt like he could not make it up the steps.  States that he has been having severe shortness of breath for the past couple of days that is worse with exertion.  Denies any significant cough but does state that he felt like he had a cold over the past couple of days.  Some diarrhea last week.  Denies any chest pain.  Denies any nausea vomiting or diaphoresis.  Denies any abdominal pain.  No fever or chills.  Denies any falls or trauma.  Felt like this was similar to prior episodes of atrial fibrillation.  Endorses compliance with his home medications but states that he is about to run out of his Eliquis as of today is his last pill.  Denies prior history of DVT or PE.  Denies any recent cocaine use.  Does endorse daily alcohol use and last drink of alcohol was yesterday.  Does endorse tobacco use but no history of COPD and no prior need of nebulizers.     Physical Exam   Triage Vital Signs: ED Triage Vitals  Enc Vitals Group     BP 08/17/22 1419 (!) 170/120     Pulse Rate 08/17/22 1419 97     Resp 08/17/22 1419 18     Temp 08/17/22 1419 97.7 F (36.5 C)     Temp Source 08/17/22 1419 Oral     SpO2 08/17/22 1419 96 %     Weight 08/17/22 1417 213 lb 13.5 oz (97 kg)     Height 08/17/22 1417 5\' 9"  (1.753 m)     Head Circumference --      Peak Flow --      Pain Score 08/17/22 1417 0     Pain Loc --      Pain Edu? --      Excl. in [place]? --     Most recent vital signs: Vitals:    08/17/22 1539 08/17/22 1630  BP:  (!) 173/125  Pulse: 93 87  Resp: (!) 34 (!) 26  Temp:    SpO2: 96% 96%    Physical Exam Constitutional:      Appearance: He is well-developed.  HENT:     Head: Atraumatic.  Eyes:     Conjunctiva/sclera: Conjunctivae normal.  Cardiovascular:     Rate and Rhythm: Regular rhythm.  Pulmonary:     Effort: No respiratory distress.     Breath sounds: No wheezing.     Comments: Tachypneic, speaking in 3 word sentences, lungs are clear to auscultation with no focal rhonchi or rale.  No wheezing.  Good air movement. Musculoskeletal:     Cervical back: Normal range of motion.     Right lower leg: No edema.     Left lower leg: No edema.  Skin:    General: Skin is warm.     Capillary Refill: Capillary refill takes less than 2 seconds.  Neurological:     Mental  Status: He is alert. Mental status is at baseline.  Psychiatric:        Mood and Affect: Mood normal.     IMPRESSION / MDM / ASSESSMENT AND PLAN / ED COURSE  I reviewed the triage vital signs and the nursing notes.  On chart review of outside records patient did have a history of atrial fibrillation, did miss his last cardiology appointment however patient states that he is followed [facility] which is why he did not miss his appointment.  States that [facility] also [facility] though due to difficulty with [facility].   EKG  I, Corena Herter, the attending physician, personally viewed and interpreted this ECG.   Rate: Normal  Rhythm: Normal sinus  Axis: Normal  Intervals: LVH, prolonged QTc of 520  ST&T Change: Nonspecific  No tachycardic or bradycardic dysrhythmias while on cardiac telemetry.  RADIOLOGY I independently reviewed imaging, my interpretation of imaging: Cardiomegaly with pulmonary vascular congestion.  LABS (all labs ordered are listed, but only abnormal results are displayed) Labs interpreted as -  No significant anemia.  Creatinine is at his  baseline.  Elevated troponin of 45 however this appears to be at his baseline has had elevated troponins in the 200s in the past.  BNP is elevated in the 700s.  Chest x-ray with cardiomegaly  Labs Reviewed  BASIC METABOLIC PANEL - Abnormal; Notable for the following components:      Result Value   Creatinine, Ser 1.54 (*)    GFR, Estimated 50 (*)    All other components within normal limits  CBC - Abnormal; Notable for the following components:   WBC 10.9 (*)    RDW 15.8 (*)    All other components within normal limits  BRAIN NATRIURETIC PEPTIDE - Abnormal; Notable for the following components:   B Natriuretic Peptide 717.3 (*)    All other components within normal limits  BLOOD GAS, VENOUS - Abnormal; Notable for the following components:   pO2, Ven <31 (*)    Bicarbonate 30.3 (*)    Acid-Base Excess 4.2 (*)    All other components within normal limits  TROPONIN I (HIGH SENSITIVITY) - Abnormal; Notable for the following components:   Troponin I (High Sensitivity) 45 (*)    All other components within normal limits  TROPONIN I (HIGH SENSITIVITY) - Abnormal; Notable for the following components:   Troponin I (High Sensitivity) 46 (*)    All other components within normal limits  RESP PANEL BY RT-PCR (RSV, FLU A&B, COVID)  RVPGX2  PROTIME-INR  APTT    TREATMENT   40 mg of IV Lasix  Patient with good urine output in the emergency department.  Ambulatory oxygen saturation 91%.  Patient states that he had improvement of his shortness of breath following IV Lasix.  Patient will take increased dose of his home Lasix for the next couple of days, given a referral to cardiology to be evaluated in the next couple of days for heart failure exacerbation.  Given return precautions to the emergency department for any worsening shortness of breath.  Given a refill of his Eliquis which she states he only has 1 more tablet of.  Also given potassium because he states his potassium drops when he  increases his Lasix dose.  PROCEDURES:  Critical Care performed: No  Procedures  Patient's presentation is most consistent with acute presentation with potential threat to life or bodily function.   MEDICATIONS ORDERED IN ED: Medications  furosemide (LASIX)  injection 40 mg (40 mg Intravenous Given 08/17/22 1654)    FINAL CLINICAL IMPRESSION(S) / ED DIAGNOSES   Final diagnoses:  Acute on chronic systolic congestive heart failure (HCC)     Rx / DC Orders   ED Discharge Orders          Ordered    Ambulatory referral to Cardiology       Comments: If you have not heard from the Cardiology office within the next 72 hours please call [phone].   08/17/22 1838    apixaban (ELIQUIS) 5 MG TABS tablet  2 times daily        08/17/22 1840    potassium chloride (KLOR-CON M) 10 MEQ tablet  2 times daily        08/17/22 1840    furosemide (LASIX) 40 MG tablet  2 times daily        08/17/22 1840             Note:  This document was prepared using Dragon voice recognition software and may include unintentional dictation errors.   Corena Herter, MD 08/17/22 [phone]

## 2023-03-01 ENCOUNTER — Other Ambulatory Visit: Payer: Self-pay

## 2023-03-01 ENCOUNTER — Emergency Department: Payer: No Typology Code available for payment source

## 2023-03-01 ENCOUNTER — Inpatient Hospital Stay
Admission: EM | Admit: 2023-03-01 | Discharge: 2023-03-06 | DRG: 291 | Disposition: A | Discharge status: Home / Self Care | Payer: No Typology Code available for payment source | Attending: Internal Medicine | Admitting: Internal Medicine

## 2023-03-01 DIAGNOSIS — I130 Hypertensive heart and chronic kidney disease with heart failure and stage 1 through stage 4 chronic kidney disease, or unspecified chronic kidney disease: Secondary | ICD-10-CM | POA: Diagnosis present

## 2023-03-01 DIAGNOSIS — Z888 Allergy status to other drugs, medicaments and biological substances status: Secondary | ICD-10-CM

## 2023-03-01 DIAGNOSIS — N179 Acute kidney failure, unspecified: Secondary | ICD-10-CM

## 2023-03-01 DIAGNOSIS — I4892 Unspecified atrial flutter: Secondary | ICD-10-CM | POA: Diagnosis present

## 2023-03-01 DIAGNOSIS — I482 Chronic atrial fibrillation, unspecified: Secondary | ICD-10-CM | POA: Diagnosis not present

## 2023-03-01 DIAGNOSIS — N189 Chronic kidney disease, unspecified: Secondary | ICD-10-CM

## 2023-03-01 DIAGNOSIS — Z7984 Long term (current) use of oral hypoglycemic drugs: Secondary | ICD-10-CM

## 2023-03-01 DIAGNOSIS — F17210 Nicotine dependence, cigarettes, uncomplicated: Secondary | ICD-10-CM | POA: Diagnosis present

## 2023-03-01 DIAGNOSIS — E669 Obesity, unspecified: Secondary | ICD-10-CM | POA: Diagnosis present

## 2023-03-01 DIAGNOSIS — Z833 Family history of diabetes mellitus: Secondary | ICD-10-CM

## 2023-03-01 DIAGNOSIS — N1831 Chronic kidney disease, stage 3a: Secondary | ICD-10-CM | POA: Diagnosis present

## 2023-03-01 DIAGNOSIS — I5023 Acute on chronic systolic (congestive) heart failure: Secondary | ICD-10-CM | POA: Diagnosis present

## 2023-03-01 DIAGNOSIS — Z79899 Other long term (current) drug therapy: Secondary | ICD-10-CM | POA: Diagnosis not present

## 2023-03-01 DIAGNOSIS — Z538 Procedure and treatment not carried out for other reasons: Secondary | ICD-10-CM | POA: Diagnosis present

## 2023-03-01 DIAGNOSIS — R7989 Other specified abnormal findings of blood chemistry: Secondary | ICD-10-CM | POA: Diagnosis not present

## 2023-03-01 DIAGNOSIS — Z7901 Long term (current) use of anticoagulants: Secondary | ICD-10-CM | POA: Diagnosis not present

## 2023-03-01 DIAGNOSIS — I4819 Other persistent atrial fibrillation: Secondary | ICD-10-CM | POA: Diagnosis present

## 2023-03-01 DIAGNOSIS — Z8249 Family history of ischemic heart disease and other diseases of the circulatory system: Secondary | ICD-10-CM | POA: Diagnosis not present

## 2023-03-01 DIAGNOSIS — I161 Hypertensive emergency: Secondary | ICD-10-CM | POA: Diagnosis present

## 2023-03-01 DIAGNOSIS — R002 Palpitations: Secondary | ICD-10-CM | POA: Diagnosis present

## 2023-03-01 DIAGNOSIS — I428 Other cardiomyopathies: Secondary | ICD-10-CM | POA: Diagnosis present

## 2023-03-01 DIAGNOSIS — Z6833 Body mass index (BMI) 33.0-33.9, adult: Secondary | ICD-10-CM | POA: Diagnosis not present

## 2023-03-01 DIAGNOSIS — I2510 Atherosclerotic heart disease of native coronary artery without angina pectoris: Secondary | ICD-10-CM | POA: Diagnosis present

## 2023-03-01 DIAGNOSIS — I160 Hypertensive urgency: Secondary | ICD-10-CM | POA: Diagnosis present

## 2023-03-01 LAB — CBC
HCT: 46.3 % (ref 39.0–52.0)
Hemoglobin: 16.8 g/dL (ref 13.0–17.0)
MCH: 31.6 pg (ref 26.0–34.0)
MCHC: 36.3 g/dL — ABNORMAL HIGH (ref 30.0–36.0)
MCV: 87.2 fL (ref 80.0–100.0)
Platelets: 214 10*3/uL (ref 150–400)
RBC: 5.31 MIL/uL (ref 4.22–5.81)
RDW: 15.4 % (ref 11.5–15.5)
WBC: 11.8 10*3/uL — ABNORMAL HIGH (ref 4.0–10.5)
nRBC: 0 % (ref 0.0–0.2)

## 2023-03-01 LAB — BASIC METABOLIC PANEL
Anion gap: 9 (ref 5–15)
BUN: 25 mg/dL — ABNORMAL HIGH (ref 8–23)
CO2: 23 mmol/L (ref 22–32)
Calcium: 8.6 mg/dL — ABNORMAL LOW (ref 8.9–10.3)
Chloride: 107 mmol/L (ref 98–111)
Creatinine, Ser: 1.78 mg/dL — ABNORMAL HIGH (ref 0.61–1.24)
GFR, Estimated: 42 mL/min — ABNORMAL LOW (ref >60)
Glucose, Bld: 124 mg/dL — ABNORMAL HIGH (ref 70–99)
Potassium: 4.1 mmol/L (ref 3.5–5.1)
Sodium: 139 mmol/L (ref 135–145)

## 2023-03-01 LAB — HEPATIC FUNCTION PANEL
ALT: 45 U/L — ABNORMAL HIGH (ref 0–44)
AST: 46 U/L — ABNORMAL HIGH (ref 15–41)
Albumin: 3.9 g/dL (ref 3.5–5.0)
Alkaline Phosphatase: 120 U/L (ref 38–126)
Bilirubin, Direct: 0.9 mg/dL — ABNORMAL HIGH (ref 0.0–0.2)
Indirect Bilirubin: 1.8 mg/dL — ABNORMAL HIGH (ref 0.3–0.9)
Total Bilirubin: 2.7 mg/dL — ABNORMAL HIGH (ref 0.3–1.2)
Total Protein: 7.4 g/dL (ref 6.5–8.1)

## 2023-03-01 LAB — TROPONIN I (HIGH SENSITIVITY)
Troponin I (High Sensitivity): 36 ng/L — ABNORMAL HIGH (ref <18)
Troponin I (High Sensitivity): 49 ng/L — ABNORMAL HIGH (ref <18)

## 2023-03-01 LAB — BRAIN NATRIURETIC PEPTIDE: B Natriuretic Peptide: 1107.8 pg/mL — ABNORMAL HIGH (ref 0.0–100.0)

## 2023-03-01 MED ORDER — ONDANSETRON HCL 4 MG/2ML IJ SOLN: 4.0000 mg | Freq: Four times a day (QID) | INTRAMUSCULAR | Status: DC | PRN

## 2023-03-01 MED ORDER — CARVEDILOL 25 MG PO TABS
25.0000 mg | ORAL_TABLET | Freq: Two times a day (BID) | ORAL | Status: DC
  Administered 2023-03-01 – 2023-03-02 (×3): 25 mg via ORAL
  Filled 2023-03-01 (×2): qty 1
  Filled 2023-03-01: qty 4
  Filled 2023-03-01: qty 1

## 2023-03-01 MED ORDER — ACETAMINOPHEN 325 MG PO TABS
650.0000 mg | ORAL_TABLET | Freq: Four times a day (QID) | ORAL | Status: DC | PRN
  Administered 2023-03-02 (×2): 650 mg via ORAL
  Filled 2023-03-01 (×2): qty 2

## 2023-03-01 MED ORDER — MAGNESIUM HYDROXIDE 400 MG/5ML PO SUSP: 30.0000 mL | Freq: Every day | ORAL | Status: DC | PRN

## 2023-03-01 MED ORDER — TRAZODONE HCL 100 MG PO TABS: 100.0000 mg | ORAL_TABLET | Freq: Every day | ORAL | Status: DC

## 2023-03-01 MED ORDER — EMPAGLIFLOZIN 10 MG PO TABS
10.0000 mg | ORAL_TABLET | Freq: Every day | ORAL | Status: DC
  Administered 2023-03-02 – 2023-03-06 (×4): 10 mg via ORAL
  Filled 2023-03-01 (×6): qty 1

## 2023-03-01 MED ORDER — METOPROLOL TARTRATE 5 MG/5ML IV SOLN
5.0000 mg | Freq: Once | INTRAVENOUS | Status: AC
  Administered 2023-03-01: 5 mg via INTRAVENOUS
  Filled 2023-03-01: qty 5

## 2023-03-01 MED ORDER — ROSUVASTATIN CALCIUM 20 MG PO TABS
20.0000 mg | ORAL_TABLET | Freq: Every day | ORAL | Status: DC
  Administered 2023-03-02 – 2023-03-05 (×4): 20 mg via ORAL
  Filled 2023-03-01: qty 2
  Filled 2023-03-01: qty 1
  Filled 2023-03-01: qty 2
  Filled 2023-03-01: qty 1
  Filled 2023-03-01: qty 2
  Filled 2023-03-01 (×2): qty 1

## 2023-03-01 MED ORDER — HYDRALAZINE HCL 50 MG PO TABS: 50.0000 mg | ORAL_TABLET | Freq: Three times a day (TID) | ORAL | Status: DC

## 2023-03-01 MED ORDER — CYCLOBENZAPRINE HCL 10 MG PO TABS
5.0000 mg | ORAL_TABLET | Freq: Three times a day (TID) | ORAL | Status: DC | PRN
  Administered 2023-03-02 (×3): 5 mg via ORAL
  Filled 2023-03-01 (×3): qty 1

## 2023-03-01 MED ORDER — TRAZODONE HCL 50 MG PO TABS
25.0000 mg | ORAL_TABLET | Freq: Every evening | ORAL | Status: DC | PRN
  Administered 2023-03-04 – 2023-03-05 (×2): 25 mg via ORAL
  Filled 2023-03-01 (×2): qty 1

## 2023-03-01 MED ORDER — ISOSORBIDE MONONITRATE ER 60 MG PO TB24: 30.0000 mg | ORAL_TABLET | Freq: Every day | ORAL | Status: DC

## 2023-03-01 MED ORDER — ACETAMINOPHEN 650 MG RE SUPP: 650.0000 mg | Freq: Four times a day (QID) | RECTAL | Status: DC | PRN

## 2023-03-01 MED ORDER — ONDANSETRON HCL 4 MG PO TABS: 4.0000 mg | ORAL_TABLET | Freq: Four times a day (QID) | ORAL | Status: DC | PRN

## 2023-03-01 MED ORDER — POTASSIUM CHLORIDE CRYS ER 20 MEQ PO TBCR: 10.0000 meq | EXTENDED_RELEASE_TABLET | Freq: Two times a day (BID) | ORAL | Status: DC

## 2023-03-01 MED ORDER — APIXABAN 5 MG PO TABS
5.0000 mg | ORAL_TABLET | Freq: Two times a day (BID) | ORAL | Status: DC
  Administered 2023-03-01 – 2023-03-06 (×10): 5 mg via ORAL
  Filled 2023-03-01 (×10): qty 1

## 2023-03-01 MED ORDER — LOSARTAN POTASSIUM 50 MG PO TABS: 25.0000 mg | ORAL_TABLET | Freq: Every day | ORAL | Status: DC

## 2023-03-01 MED ORDER — NITROGLYCERIN 2 % TD OINT
1.0000 [in_us] | TOPICAL_OINTMENT | Freq: Once | TRANSDERMAL | Status: AC
  Administered 2023-03-01: 1 [in_us] via TOPICAL
  Filled 2023-03-01: qty 1

## 2023-03-01 MED ORDER — FUROSEMIDE 10 MG/ML IJ SOLN
40.0000 mg | Freq: Once | INTRAMUSCULAR | Status: AC
  Administered 2023-03-01: 40 mg via INTRAVENOUS
  Filled 2023-03-01: qty 4

## 2023-03-01 MED ORDER — FUROSEMIDE 10 MG/ML IJ SOLN
40.0000 mg | Freq: Two times a day (BID) | INTRAMUSCULAR | Status: DC
  Administered 2023-03-02: 40 mg via INTRAVENOUS
  Filled 2023-03-01: qty 4

## 2023-03-01 NOTE — Assessment & Plan Note (Addendum)
Prior echocardiogram with EF of 30 to 35% done in July 2022. Patient apparently ran out of Lasix for the past 6 few days. Cardiology was consulted. -Cardiology increased the dose of Lasix to 80 mg twice daily -Added losartan 25 mg daily -Daily weight and BMP -Strict intake and output

## 2023-03-01 NOTE — H&P (Addendum)
[place]   PATIENT NAME: Gregory Howell    MR#:  [id number]  DATE OF BIRTH:  [birth year]  DATE OF ADMISSION:  03/01/2023  PRIMARY CARE PHYSICIAN: [facility]   Patient is coming from: Home  REQUESTING/REFERRING PHYSICIAN: Shaune Pollack, MD  CHIEF COMPLAINT:   Chief Complaint  Patient presents with   Shortness of Breath    HISTORY OF PRESENT ILLNESS:  Gregory Howell is a 62 y.o. male with medical history significant for cystoscopy CHF, stage II CKD, CAD, hypertension and nonischemic cardiomyopathy  as well as chronic fibrillation presented to the emergency room with a [place] of worsening dyspnea over the last couple weeks with associated orthopnea that was up to 3-4 pillow orthopnea and worsening lower extremity edema.  He admitted to paroxysmal nocturnal dyspnea as well as dyspnea on exertion.  No fever or chills.  He has been having palpitations and occasional chest tightness.  No dysuria, oliguria or hematuria or flank pain.  ED Course: When the patient came to the ER the ER, BP was 176/125 and later 159/125 with a pulse of 113 respiratory to 22 and pulse 79 9% on 2 L of O2 by nasal cannula.  CMP revealed a BUN of creatinine 1.78 above previous levels and calcium 8.6, AST 46 ALT 45 direct bili of 0.9 and total bili of 2.7 with indirect bili 1.8.  High sensitive troponin I was 36 and later 49.  CBC showed leukocytosis of 11.8. EKG as reviewed by me : EKG showed atrial flutter/fibrillation with RVR of 114 with minimal voltage criteria for LVH and prolonged QT interval with QTc of 554 MS Imaging: Two-view chest x-ray showed mild central vascular congestion.  The patient was given 5 mg of IV Lopressor initially Nitropaste and 40 mg of IV Lasix.  He will be admitted to a cardiac telemetry bed for further evaluation and management. PAST MEDICAL HISTORY:   Past Medical History:  Diagnosis Date   CHF (congestive heart failure) (HCC)    CKD (chronic kidney  disease), stage II    Coronary artery disease, non-occlusive    HFrEF (heart failure with reduced ejection fraction) (HCC)    Hypertension    Hypertensive heart disease    NICM (nonischemic cardiomyopathy) (HCC)    Persistent atrial fibrillation (HCC)     PAST SURGICAL HISTORY:   Past Surgical History:  Procedure Laterality Date   RIGHT/LEFT HEART CATH AND CORONARY ANGIOGRAPHY N/A 12/07/2019   Procedure: RIGHT/LEFT HEART CATH AND CORONARY ANGIOGRAPHY;  Surgeon: Antonieta Iba, MD;  Location: [facility] INVASIVE CV LAB;  Service: Cardiovascular;  Laterality: N/A;    SOCIAL HISTORY:   Social History   Tobacco Use   Smoking status: Some Days    Current packs/day: 0.50    Types: Cigarettes   Smokeless tobacco: Never  Substance Use Topics   Alcohol use: Yes    Alcohol/week: 7.0 - 14.0 standard drinks of alcohol    Types: 7 - 14 Cans of beer per week    Comment: daily    FAMILY HISTORY:   Family History  Problem Relation Age of Onset   Hypertension Mother    Hypertension Father    CAD Brother    Diabetes Brother     DRUG ALLERGIES:   Allergies  Allergen Reactions   Lisinopril     Other reaction(s): ANGIOEDEMA OF LIPS    REVIEW OF SYSTEMS:   ROS As per history of present illness. All pertinent systems were  reviewed above. Constitutional, HEENT, cardiovascular, respiratory, GI, GU, musculoskeletal, neuro, psychiatric, endocrine, integumentary and hematologic systems were reviewed and are otherwise negative/unremarkable except for positive findings mentioned above in the HPI.   MEDICATIONS AT HOME:   Prior to Admission medications   Medication Sig Start Date End Date Taking? Authorizing Provider  apixaban (ELIQUIS) 5 MG TABS tablet Take 1 tablet (5 mg total) by mouth 2 (two) times daily. 08/17/22  Yes Mumma, Carollee Herter, MD  carvedilol (COREG) 25 MG tablet Take 25 mg by mouth 2 (two) times daily.   Yes [provider]  furosemide (LASIX) 40 MG tablet Take 40 mg by  mouth 2 (two) times daily.   Yes [provider]  rosuvastatin (CRESTOR) 40 MG tablet Take 20 mg by mouth every evening.   Yes [provider]  empagliflozin (JARDIANCE) 10 MG TABS tablet Take 10 mg by mouth daily.    [provider]      VITAL SIGNS:  Blood pressure (!) 125/92, pulse (!) 46, temperature 97.9 F (36.6 C), resp. rate (!) 26, height 5\' 9"  (1.753 m), weight 95.3 kg, SpO2 99%.  PHYSICAL EXAMINATION:  Physical Exam  GENERAL:  62 y.o.-year-old African-American male patient lying in the bed with no acute distress.  EYES: Pupils equal, round, reactive to light and accommodation. No scleral icterus. Extraocular muscles intact.  HEENT: Head atraumatic, normocephalic. Oropharynx and nasopharynx clear.  NECK:  Supple, no jugular venous distention. No thyroid enlargement, no tenderness.  LUNGS: Diminished bibasal breath sounds with bibasal rales.. No use of accessory muscles of respiration.  CARDIOVASCULAR: Regular rate and rhythm, S1, S2 normal. No murmurs, rubs, or gallops.  ABDOMEN: Soft, nondistended, nontender. Bowel sounds present. No organomegaly or mass.  EXTREMITIES: 1-2+ bilateral lower extremity pitting edema with no cyanosis, or clubbing.  NEUROLOGIC: Cranial nerves II through XII are intact. Muscle strength 5/5 in all extremities. Sensation intact. Gait not checked.  PSYCHIATRIC: The patient is alert and oriented x 3.  Normal affect and good eye contact. SKIN: No obvious rash, lesion, or ulcer.   LABORATORY PANEL:   CBC Recent Labs  Lab 03/01/23 1627  WBC 11.8*  HGB 16.8  HCT 46.3  PLT 214   ------------------------------------------------------------------------------------------------------------------  Chemistries  Recent Labs  Lab 03/01/23 1627  NA 139  K 4.1  CL 107  CO2 23  GLUCOSE 124*  BUN 25*  CREATININE 1.78*  CALCIUM 8.6*  AST 46*  ALT 45*  ALKPHOS 120  BILITOT 2.7*    ------------------------------------------------------------------------------------------------------------------  Cardiac Enzymes No results for input(s): "TROPONINI" in the last 168 hours. ------------------------------------------------------------------------------------------------------------------  RADIOLOGY:  DG Chest 2 View  Result Date: 03/01/2023 CLINICAL DATA:  Chest pain EXAM: CHEST - 2 VIEW COMPARISON:  08/17/2022 FINDINGS: Cardiac shadow is mildly enlarged but stable. Lungs are well aerated bilaterally. No focal infiltrate or effusion is seen. Mild central vascular congestion is again noted. No significant edema is seen. No bony abnormality is noted. IMPRESSION: Mild central vascular congestion without acute parenchymal abnormality. Electronically Signed   By: Alcide Clever M.D.   On: 03/01/2023 17:12      IMPRESSION AND PLAN:  Assessment and Plan: * Acute on chronic systolic CHF (congestive heart failure) (HCC)  -The patient will be admitted to a cardiac telemetry bed. - We will continue diuresis with IV Lasix. - We will continue Jardiance and Coreg. - We Will follow serial troponins. - We will follow I's and O's and daily weights. - Cardiology consult be obtained. - I notified [facility]  group about the consult with a timed message.    Acute kidney injury superimposed on chronic kidney disease (HCC) - This is likely prerenal due to renal hypoperfusion from acute CHF. - We will monitor BMPs with IV Lasix diuresis.  Chronic atrial fibrillation with RVR (HCC) - We will continue Eliquis and Coreg. - She responded very well to IV Lopressor with rate down to 94.  Elevated LFTs -This is likely with associated congestive hepatopathy, -Will check LFTs with diuresis.  Hypertensive urgency - The patient will be placed on as needed IV labetalol. -Will continue Coreg.   DVT prophylaxis: Lovenox.  Advanced Care Planning:  Code Status: full code.  Family Communication:   The plan of care was discussed in details with the patient (and family). I answered all questions. The patient agreed to proceed with the above mentioned plan. Further management will depend upon hospital course. Disposition Plan: Back to previous home environment Consults called: Cardiology. All the records are reviewed and case discussed with ED provider.  Status is: Inpatient  At the time of the admission, it appears that the appropriate admission status for this patient is inpatient.  This is judged to be reasonable and necessary in order to provide the required intensity of service to ensure the patient's safety given the presenting symptoms, physical exam findings and initial radiographic and laboratory data in the context of comorbid conditions.  The patient requires inpatient status due to high intensity of service, high risk of further deterioration and high frequency of surveillance required.  I certify that at the time of admission, it is my clinical judgment that the patient will require inpatient hospital care extending more than 2 midnights.                            Dispo: The patient is from: Home              Anticipated d/c is to: Home              Patient currently is not medically stable to d/c.              Difficult to place patient: No  Hannah Beat M.D on 03/01/2023 at 9:08 PM  [facility]   From 7 PM-7 AM, contact night-coverage [email or web address]  CC: Primary care physician; [facility]

## 2023-03-01 NOTE — Assessment & Plan Note (Signed)
Renal function seems stable and close to baseline. -Monitor renal function while patient is being diuresed -Avoid nephrotoxins

## 2023-03-01 NOTE — ED Triage Notes (Signed)
Pt to ED for shob for past 2 weeks. States unable to lay down or sleep. Ran out of lasix.  Reports intermittent chest pain.

## 2023-03-01 NOTE — Assessment & Plan Note (Addendum)
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 Significantly distended belly with concern of ascites. -Liver ultrasound ordered -Continue with diuresis

## 2023-03-01 NOTE — Assessment & Plan Note (Signed)
Blood pressure within goal. -Continue Coreg -Low-dose losartan was added by cardiology -Patient is being diuresed with IV Lasix

## 2023-03-01 NOTE — ED Provider Notes (Signed)
[facility] Provider Note    Event Date/Time   First MD Initiated Contact with Patient 03/01/23 1715     (approximate)   History   Shortness of Breath   HPI  Gregory Howell is a 62 y.o. male  here with chest pain and chest pressure x 2 weeks, worsening SOB. H/o AFib, CM, CHF. Ran out of his medications about 2 weeks ago and sx have been worsening since then. He's had increasing LE swelling, dyspnea since then. He feels markedly SOB with lying flat, sitting down. He has had some mild chest pressure. He has been having difficulty sleeping 2/2 his SOB.   Physical Exam   Triage Vital Signs: ED Triage Vitals  Encounter Vitals Group     BP 03/01/23 1625 (!) 176/125     Systolic BP Percentile --      Diastolic BP Percentile --      Pulse Rate 03/01/23 1625 (!) 113     Resp 03/01/23 1625 (!) 22     Temp 03/01/23 1625 97.9 F (36.6 C)     Temp src --      SpO2 03/01/23 1625 99 %     Weight 03/01/23 1626 210 lb (95.3 kg)     Height 03/01/23 1626 5\' 9"  (1.753 m)     Head Circumference --      Peak Flow --      Pain Score 03/01/23 1625 3     Pain Loc --      Pain Education --      Exclude from Growth Chart --     Most recent vital signs: Vitals:   03/01/23 1625 03/01/23 1800  BP: (!) 176/125 (!) 125/92  Pulse: (!) 113 (!) 46  Resp: (!) 22 (!) 26  Temp: 97.9 F (36.6 C)   SpO2: 99% 99%     General: Awake, no distress.  CV:  Good peripheral perfusion. RRR. Resp:  Mild tachypnea with bilateral rales, rhonchi. Abd:  No distention. No tenderness. Other:  2+ pitting edema bl LE   ED Results / Procedures / Treatments   Labs (all labs ordered are listed, but only abnormal results are displayed) Labs Reviewed  BASIC METABOLIC PANEL - Abnormal; Notable for the following components:      Result Value   Glucose, Bld 124 (*)    BUN 25 (*)    Creatinine, Ser 1.78 (*)    Calcium 8.6 (*)    GFR, Estimated 42 (*)    All other components within  normal limits  CBC - Abnormal; Notable for the following components:   WBC 11.8 (*)    MCHC 36.3 (*)    All other components within normal limits  HEPATIC FUNCTION PANEL - Abnormal; Notable for the following components:   AST 46 (*)    ALT 45 (*)    Total Bilirubin 2.7 (*)    Bilirubin, Direct 0.9 (*)    Indirect Bilirubin 1.8 (*)    All other components within normal limits  BRAIN NATRIURETIC PEPTIDE - Abnormal; Notable for the following components:   B Natriuretic Peptide 1,107.8 (*)    All other components within normal limits  TROPONIN I (HIGH SENSITIVITY) - Abnormal; Notable for the following components:   Troponin I (High Sensitivity) 36 (*)    All other components within normal limits  TROPONIN I (HIGH SENSITIVITY)     EKG Atrial flutter, VR 114. QRS 104, QTc 554. No acute ST elevations. Non specific  TWI and ST changes, likely rate-related.   RADIOLOGY CXR: central vascular congestion, no focal opacity   I also independently reviewed and agree with radiologist interpretations.   PROCEDURES:  Critical Care performed: No  .1-3 Lead EKG Interpretation  Performed by: Shaune Pollack, MD Authorized by: Shaune Pollack, MD     Interpretation: abnormal     ECG rate:  70-120   ECG rate assessment: tachycardic     Rhythm: atrial flutter     Ectopy: none     Conduction: normal   Comments:     Indication: Chest pain, SOB, weakness     MEDICATIONS ORDERED IN ED: Medications  furosemide (LASIX) injection 40 mg (40 mg Intravenous Given 03/01/23 1745)  metoprolol tartrate (LOPRESSOR) injection 5 mg (5 mg Intravenous Given 03/01/23 1745)  nitroGLYCERIN (NITROGLYN) 2 % ointment 1 inch (1 inch Topical Given 03/01/23 1751)     IMPRESSION / MDM / ASSESSMENT AND PLAN / ED COURSE  I reviewed the triage vital signs and the nursing notes.                              Differential diagnosis includes, but is not limited to, CHF exacerbation, symptomatic AFib/Flutter,  anemia, ACS, PNA  Patient's presentation is most consistent with acute presentation with potential threat to life or bodily function.  The patient is on the cardiac monitor to evaluate for evidence of arrhythmia and/or significant heart rate changes  62 yo M with h/o HTN, CHF, AFib on anticoagulation here with SOB. Pt appears hypervolemic on exam w/ bl edema, orthopnea, and bilateral rales. Pt also in aflutter with RVR On arrival, improved with lopressor IV. BNP markedly elevated. Trop 30s c/w demand - no ST elevations. No CP currently. Labs o/w near baseline, moderate CKD noted. No signs of infection. Will give lasix, admit to medicine. Requiring 2L [place] which is new.    FINAL CLINICAL IMPRESSION(S) / ED DIAGNOSES   Final diagnoses:  Acute on chronic systolic congestive heart failure (HCC)  Atrial flutter with rapid ventricular response (HCC)     Rx / DC Orders   ED Discharge Orders     None        Note:  This document was prepared using Dragon voice recognition software and may include unintentional dictation errors.   Shaune Pollack, MD 03/01/23 [phone]

## 2023-03-01 NOTE — ED Notes (Signed)
Pt in line to be registered and obviously tachypneic. Pt stated that he was out of lasix. Pt placed on 2 LNC.

## 2023-03-01 NOTE — Assessment & Plan Note (Signed)
Persistent atrial flutter with RVR -Cardiology is planning DCCV tomorrow - We will continue Eliquis and Coreg.

## 2023-03-02 ENCOUNTER — Inpatient Hospital Stay: Payer: No Typology Code available for payment source

## 2023-03-02 DIAGNOSIS — I4892 Unspecified atrial flutter: Secondary | ICD-10-CM | POA: Diagnosis not present

## 2023-03-02 DIAGNOSIS — R7989 Other specified abnormal findings of blood chemistry: Secondary | ICD-10-CM

## 2023-03-02 DIAGNOSIS — I482 Chronic atrial fibrillation, unspecified: Secondary | ICD-10-CM | POA: Diagnosis not present

## 2023-03-02 DIAGNOSIS — I5023 Acute on chronic systolic (congestive) heart failure: Secondary | ICD-10-CM | POA: Diagnosis not present

## 2023-03-02 DIAGNOSIS — N179 Acute kidney failure, unspecified: Secondary | ICD-10-CM | POA: Diagnosis not present

## 2023-03-02 LAB — BLOOD GAS, ARTERIAL
Acid-Base Excess: 0.1 mmol/L (ref 0.0–2.0)
Bicarbonate: 23.6 mmol/L (ref 20.0–28.0)
O2 Saturation: 96.5 %
Patient temperature: 37
pCO2 arterial: 34 mmHg (ref 32–48)
pH, Arterial: 7.45 (ref 7.35–7.45)
pO2, Arterial: 76 mmHg — ABNORMAL LOW (ref 83–108)

## 2023-03-02 LAB — HEPATIC FUNCTION PANEL
ALT: 43 U/L (ref 0–44)
AST: 44 U/L — ABNORMAL HIGH (ref 15–41)
Albumin: 3.6 g/dL (ref 3.5–5.0)
Alkaline Phosphatase: 110 U/L (ref 38–126)
Bilirubin, Direct: 0.7 mg/dL — ABNORMAL HIGH (ref 0.0–0.2)
Indirect Bilirubin: 1.5 mg/dL — ABNORMAL HIGH (ref 0.3–0.9)
Total Bilirubin: 2.2 mg/dL — ABNORMAL HIGH (ref 0.3–1.2)
Total Protein: 7.2 g/dL (ref 6.5–8.1)

## 2023-03-02 LAB — BASIC METABOLIC PANEL
Anion gap: 8 (ref 5–15)
BUN: 28 mg/dL — ABNORMAL HIGH (ref 8–23)
CO2: 23 mmol/L (ref 22–32)
Calcium: 8.3 mg/dL — ABNORMAL LOW (ref 8.9–10.3)
Chloride: 106 mmol/L (ref 98–111)
Creatinine, Ser: 1.78 mg/dL — ABNORMAL HIGH (ref 0.61–1.24)
GFR, Estimated: 42 mL/min — ABNORMAL LOW (ref >60)
Glucose, Bld: 107 mg/dL — ABNORMAL HIGH (ref 70–99)
Potassium: 4.4 mmol/L (ref 3.5–5.1)
Sodium: 137 mmol/L (ref 135–145)

## 2023-03-02 LAB — CBC
HCT: 44.3 % (ref 39.0–52.0)
Hemoglobin: 15.8 g/dL (ref 13.0–17.0)
MCH: 31.6 pg (ref 26.0–34.0)
MCHC: 35.7 g/dL (ref 30.0–36.0)
MCV: 88.6 fL (ref 80.0–100.0)
Platelets: 193 10*3/uL (ref 150–400)
RBC: 5 MIL/uL (ref 4.22–5.81)
RDW: 15 % (ref 11.5–15.5)
WBC: 9.1 10*3/uL (ref 4.0–10.5)
nRBC: 0 % (ref 0.0–0.2)

## 2023-03-02 LAB — LACTIC ACID, PLASMA: Lactic Acid, Venous: 1.6 mmol/L (ref 0.5–1.9)

## 2023-03-02 LAB — HIV ANTIBODY (ROUTINE TESTING W REFLEX): HIV Screen 4th Generation wRfx: NONREACTIVE

## 2023-03-02 MED ORDER — LORAZEPAM 2 MG/ML IJ SOLN
1.0000 mg | Freq: Once | INTRAMUSCULAR | Status: AC
  Administered 2023-03-02: 1 mg via INTRAVENOUS
  Filled 2023-03-02: qty 1

## 2023-03-02 MED ORDER — POTASSIUM CHLORIDE CRYS ER 20 MEQ PO TBCR
20.0000 meq | EXTENDED_RELEASE_TABLET | Freq: Once | ORAL | Status: AC
  Administered 2023-03-02: 20 meq via ORAL
  Filled 2023-03-02: qty 1

## 2023-03-02 MED ORDER — LOSARTAN POTASSIUM 25 MG PO TABS
25.0000 mg | ORAL_TABLET | Freq: Every day | ORAL | Status: DC
  Administered 2023-03-02: 25 mg via ORAL
  Filled 2023-03-02 (×3): qty 1

## 2023-03-02 MED ORDER — FUROSEMIDE 10 MG/ML IJ SOLN
80.0000 mg | Freq: Two times a day (BID) | INTRAMUSCULAR | Status: DC
  Administered 2023-03-02 – 2023-03-03 (×2): 80 mg via INTRAVENOUS
  Filled 2023-03-02 (×2): qty 8

## 2023-03-02 NOTE — ED Notes (Signed)
Pt requesting potassium to prevent muscle cramps from lasix. Dr Gean Maidens.

## 2023-03-02 NOTE — Progress Notes (Signed)
Progress Note   Patient: Gregory Howell JXB:[id number] DOB: March 21, 1961 DOA: 03/01/2023     1 DOS: the patient was seen and examined on 03/02/2023   Brief hospital course: Taken from H&P.  Gregory Howell is a 62 y.o. male with medical history significant for cystoscopy CHF, stage II CKD, CAD, hypertension and nonischemic cardiomyopathy  as well as chronic fibrillation presented to the emergency room with worsening dyspnea over the last couple weeks with associated orthopnea that was up to 3-4 pillow orthopnea, PND and worsening lower extremity edema.   ED Course:  BP was 176/125 and later 159/125 with a pulse of 113 respiratory to 22 ,99% on 2 L of O2 by nasal cannula.  CMP revealed a BUN of creatinine 1.78 above previous levels and calcium 8.6, AST 46 ALT 45 direct bili of 0.9 and total bili of 2.7 with indirect bili 1.8.  High sensitive troponin I was 36 and later 49.  CBC showed leukocytosis of 11.8.  BNP 1107 EKG  showed atrial flutter/fibrillation with RVR of 114 with minimal voltage criteria for LVH and prolonged QT interval with QTc of 554 MS Imaging: Two-view chest x-ray showed mild central vascular congestion.  Patient was started on IV diuresis and cardiology was consulted.  7/23: Overnight worsening complaint of shortness of breath, saturating well on 2 L, ABG with pO2 of 76%, renal function stable,Leukocytosis resolved. Significantly distended belly,concern of ascites, CT renal stone study done last year with concern of hepatic cirrhosis.  Liver ultrasound ordered.    Assessment and Plan: * Acute on chronic systolic CHF (congestive heart failure) (HCC) Prior echocardiogram with EF of 30 to 35% done in July 2022. Patient apparently ran out of Lasix for the past 6 few days. Cardiology was consulted. -Cardiology increased the dose of Lasix to 80 mg twice daily -Added losartan 25 mg daily -Daily weight and BMP -Strict intake and output    Acute kidney injury superimposed on  chronic kidney disease (HCC) Renal function seems stable and close to baseline. -Monitor renal function while patient is being diuresed -Avoid nephrotoxins  Chronic atrial fibrillation with RVR (HCC) Persistent atrial flutter with RVR -Cardiology is planning DCCV tomorrow - We will continue Eliquis and Coreg.   Elevated LFTs CT renal stone study done last year with concern of liver cirrhosis. Significantly distended belly with concern of ascites. -Liver ultrasound ordered -Continue with diuresis  Hypertensive urgency Blood pressure within goal. -Continue Coreg -Low-dose losartan was added by cardiology -Patient is being diuresed with IV Lasix   Subjective: Patient was feeling little improved when seen during morning rounds, stating that he is still having some shortness of breath but better than before. Significantly distended belly, per patient he is seeing more distention recently.  Used to be a heavy drinker but stating that he has stopped drinking.  Physical Exam: Vitals:   03/02/23 1330 03/02/23 1400 03/02/23 1410 03/02/23 1412  BP: (!) 123/97 122/87    Pulse: (!) 25 75 67   Resp: 17 18 (!) 24   Temp:    97.7 F (36.5 C)  TempSrc:    Oral  SpO2: 95% (!) 87% 92%   Weight:      Height:       General.  Overweight gentleman, in no acute distress. Pulmonary.  Lungs clear bilaterally, normal respiratory effort. CV.  Regular rate and rhythm, no JVD, rub or murmur. Abdomen.  Soft, nontender, distended, BS positive. CNS.  Alert and oriented .  No focal neurologic  deficit. Extremities.  No edema, no cyanosis, pulses intact and symmetrical. Psychiatry.  Judgment and insight appears normal.   Data Reviewed: Prior data reviewed  Family Communication: Discussed with patient  Disposition: Status is: Inpatient Remains inpatient appropriate because: Severity of illness  Planned Discharge Destination: Home  DVT prophylaxis.  Eliquis Time spent: 50 minutes  This record  has been created using Conservation officer, historic buildings. Errors have been sought and corrected,but may not always be located. Such creation errors do not reflect on the standard of care.   Author: Arnetha Courser, MD 03/02/2023 2:26 PM  For on call review [email or web address].

## 2023-03-02 NOTE — Consult Note (Signed)
ADVANCED HEART FAILURE CONSULT NOTE  Referring Physician: No ref. provider found  Primary Care: [facility]  HPI: Gregory Howell is a 62 y.o. male with HFrEF, atrial fibrillation, nonobstructive CAD currently admitted with HFrEF exacerbation 2/2 atrial fibrillation.   He reports being diagnosed with heart problems in April 2021.He reports becoming suddenly SOB at that time. He was admitted at [facility] for new onset atrial fibrillation w/ TTE demonstrating LVEF of 25%-30%. LHC at that time with mild nonobstructive CAD. He subsequently transferred his care to the [place] where he was cardioverted to NSR last year. Since that time he was doing fairly well until the past week when he started becoming increasingly short of breath with lower extremity edema, PND and orthopnea. He ran out of lasix 5 days ago also further exacerbating his symptoms.  He came to the [facility] emergency department where lab work was significant for BNP of 1100, high-sensitivity troponin of 49, EKG with atrial flutter with RVR and chest x-ray with mild pulmonary edema.   Past Medical History:  Diagnosis Date   CHF (congestive heart failure) (HCC)    CKD (chronic kidney disease), stage II    Coronary artery disease, non-occlusive    HFrEF (heart failure with reduced ejection fraction) (HCC)    Hypertension    Hypertensive heart disease    NICM (nonischemic cardiomyopathy) (HCC)    Persistent atrial fibrillation (HCC)     Current Facility-Administered Medications  Medication Dose Route Frequency Provider Last Rate Last Admin   acetaminophen (TYLENOL) tablet 650 mg  650 mg Oral Q6H PRN Mansy, Jan A, MD   650 mg at 03/02/23 0130   Or   acetaminophen (TYLENOL) suppository 650 mg  650 mg Rectal Q6H PRN Mansy, Jan A, MD       apixaban Everlene Balls) tablet 5 mg  5 mg Oral BID Mansy, Jan A, MD   5 mg at 03/01/23 2331   carvedilol (COREG) tablet 25 mg  25 mg Oral BID Mansy, Jan A, MD   25 mg at 03/01/23 2331    cyclobenzaprine (FLEXERIL) tablet 5 mg  5 mg Oral TID PRN Mansy, Jan A, MD   5 mg at 03/02/23 0130   empagliflozin (JARDIANCE) tablet 10 mg  10 mg Oral Daily Mansy, Jan A, MD       furosemide (LASIX) injection 40 mg  40 mg Intravenous Q12H Mansy, Jan A, MD   40 mg at 03/02/23 0981   magnesium hydroxide (MILK OF MAGNESIA) suspension 30 mL  30 mL Oral Daily PRN Mansy, Jan A, MD       ondansetron [facility]) tablet 4 mg  4 mg Oral Q6H PRN Mansy, Jan A, MD       Or   ondansetron [facility]) injection 4 mg  4 mg Intravenous Q6H PRN Mansy, Jan A, MD       rosuvastatin (CRESTOR) tablet 20 mg  20 mg Oral QHS Mansy, Jan A, MD       traZODone (DESYREL) tablet 25 mg  25 mg Oral QHS PRN Mansy, Vernetta Honey, MD       Current Outpatient Medications  Medication Sig Dispense Refill   apixaban (ELIQUIS) 5 MG TABS tablet Take 1 tablet (5 mg total) by mouth 2 (two) times daily. 60 tablet 0   carvedilol (COREG) 25 MG tablet Take 25 mg by mouth 2 (two) times daily.     furosemide (LASIX) 40 MG tablet Take 40 mg by mouth 2 (two) times daily.  rosuvastatin (CRESTOR) 40 MG tablet Take 20 mg by mouth every evening.     empagliflozin (JARDIANCE) 10 MG TABS tablet Take 10 mg by mouth daily.      Allergies  Allergen Reactions   Lisinopril     Other reaction(s): ANGIOEDEMA OF LIPS      Social History   Socioeconomic History   Marital status: Married    Spouse name: Not on file   Number of children: Not on file   Years of education: Not on file   Highest education level: Not on file  Occupational History   Not on file  Tobacco Use   Smoking status: Some Days    Current packs/day: 0.50    Types: Cigarettes   Smokeless tobacco: Never  Substance and Sexual Activity   Alcohol use: Yes    Alcohol/week: 7.0 - 14.0 standard drinks of alcohol    Types: 7 - 14 Cans of beer per week    Comment: daily   Drug use: Never   Sexual activity: Not on file  Other Topics Concern   Not on file  Social History Narrative   Not  on file   Social Determinants of Health   Financial Resource Strain: Not on file  Food Insecurity: Not on file  Transportation Needs: Not on file  Physical Activity: Not on file  Stress: Not on file  Social Connections: Not on file  Intimate Partner Violence: Not on file      Family History  Problem Relation Age of Onset   Hypertension Mother    Hypertension Father    CAD Brother    Diabetes Brother     PHYSICAL EXAM: Vitals:   03/02/23 0730 03/02/23 0900  BP: (!) 140/95 114/87  Pulse: (!) 53   Resp: 13   Temp:    SpO2: 99%    GENERAL: Well nourished, well developed, and in no apparent distress at rest.  HEENT: Negative for arcus senilis or xanthelasma. There is no scleral icterus.  The mucous membranes are pink and moist.   NECK: Supple, No masses. Normal carotid upstrokes without bruits. No masses or thyromegaly.    CHEST: There are no chest wall deformities. There is no chest wall tenderness. Respirations are unlabored.  Lungs-decreased at bases CARDIAC:  JVP: 10 cm H2O         Normal S1, S2  Normal rate with irregular rhythm. No murmurs, rubs or gallops.  Pulses are 2+ and symmetrical in upper and lower extremities.  +1 edema.  ABDOMEN: Soft, non-tender, non-distended. There are no masses or hepatomegaly. There are normal bowel sounds.  EXTREMITIES: Warm and well perfused with no cyanosis, clubbing.  LYMPHATIC: No axillary or supraclavicular lymphadenopathy.  NEUROLOGIC: Patient is oriented x3 with no focal or lateralizing neurologic deficits.  PSYCH: Patients affect is appropriate, there is no evidence of anxiety or depression.  SKIN: Warm and dry; no lesions or wounds.   DATA REVIEW  ECG: 03/02/23: atrial flutter with RVR  As per my personal interpretation  ECHO: 02/28/21: LVEF 30-35%, moderately reduced RV function  CATH: 12/07/19: 2nd Mrg lesion is 100% stenosed. Dist Cx lesion is 60% stenosed. 1st Mrg lesion is 40% stenosed. Ost RCA lesion is 50%  stenosed. RPAV lesion is 60% stenosed. Mid RCA lesion is 30% stenosed. LV end diastolic pressure is mildly elevated. There is severe left ventricular systolic dysfunction. The left ventricular ejection fraction is less than 25% by visual estimate. Hemodynamic findings consistent with mild pulmonary hypertension.  ASSESSMENT & PLAN:  Acute on chronic heart failure with reduced ejection fraction -Doing fairly well until last week when he became suddenly short of breath.  Since that time has had progressively worsening lower extremity edema and abdominal wall protuberance.  Likely secondary to recurrence of atrial flutter/fibrillation with RVR.  Around this time he also ran out of Lasix. -Today on exam +1 edema in bilateral lower extremities, JVP 10 to 14 cm. -Increasing Lasix to 80 mg twice daily, starting losartan 25 mg daily. -Continue apixaban 5 mg twice daily -Extremities slightly cool to touch.  Will obtain lactic acid.  2.  Atrial flutter with variable conduction -N.p.o. after midnight for TEE/cardioversion tomorrow.  Joellyn Grandt Advanced Heart Failure Mechanical Circulatory Support

## 2023-03-02 NOTE — ED Notes (Signed)
Ultrasound at bedside

## 2023-03-02 NOTE — Progress Notes (Signed)
CROSS COVER NOTE  NAME: Gregory Howell MRN: [id number] DOB : [birth year]    Concern as stated by nurse / staff   His oxygen saturation is fine, but he says he feels like he can't breathe. He is currently in A-fib with a rate running 80's to low 100's . Respirations are 18-22, and his oxygen saturation is 94% on room air. He had been on [facility], but has taken it off saying he couldn't tell it was helping, and it was aggravating his nose and ears. Is there anything else we need to be doing for him?  He has had very little output since the original lasix dose. A repeat CXR might not be a bad idea. I don't think he needs bipap, but if you want, we can do a blood gas just to check and see.      Pertinent findings on chart review: Patient admitted earlier with CHF exacerbation and hypertensive emergency as well as A-fib with RVR.  He is on Eliquis  Patient assessment Review of Systems  Constitutional: Negative.  Negative for diaphoresis.  Cardiovascular:  Positive for orthopnea. Negative for chest pain and palpitations.  Psychiatric/Behavioral:         Appears anxious.  Mentions he lives alone and does not see anyone during the day    Patient appears very anxious, tachypneic, conversational dyspnea.  Not diaphoretic.  Denies chest pain    03/02/2023    3:30 AM 03/02/2023    1:30 AM 03/02/2023    1:00 AM  Vitals with BMI  Systolic 124 147 409  Diastolic 106 99 119  Pulse 80 98 50   Physical Exam Vitals and nursing note reviewed.  Constitutional:      General: He is not in acute distress. HENT:     Head: Normocephalic and atraumatic.  Cardiovascular:     Rate and Rhythm: Normal rate and regular rhythm.     Heart sounds: Normal heart sounds.  Pulmonary:     Effort: Tachypnea present.     Breath sounds: Normal breath sounds.  Abdominal:     Palpations: Abdomen is soft.     Tenderness: There is no abdominal tenderness.  Neurological:     Mental Status: Mental status is at  baseline.       Workup  ABG    Component Value Date/Time   PHART 7.45 03/02/2023 0240   PCO2ART 34 03/02/2023 0240   PO2ART 76 (L) 03/02/2023 0240   HCO3 23.6 03/02/2023 0240   O2SAT 96.5 03/02/2023 0240   CXR IMPRESSION: Cardiomegaly.  No active disease.  Assessment and  Interventions   Assessment: Acute dyspnea, suspect related to CHF, some element of anxiety Low suspicion for PE as patient is on Eliquis  Plan: Oxygen for breathing comfort Ativan 1 mg x 1 IV Continue to monitor      CRITICAL CARE Performed by: Andris Baumann   Total critical care time: 40 minutes  Critical care time was exclusive of separately billable procedures and treating other patients.  Critical care was necessary to treat or prevent imminent or life-threatening deterioration.  Critical care was time spent personally by me on the following activities: development of treatment plan with patient and/or surrogate as well as nursing, discussions with consultants, evaluation of patient's response to treatment, examination of patient, obtaining history from patient or surrogate, ordering and performing treatments and interventions, ordering and review of laboratory studies, ordering and review of radiographic studies, pulse oximetry and  re-evaluation of patient's condition.

## 2023-03-02 NOTE — TOC CM/SW Note (Signed)
Cm received TOC consult for SN[facility]. Awaiting PT/OT eval for recommendations. Cm continue to follow.

## 2023-03-02 NOTE — Hospital Course (Addendum)
Taken from H&P.  Gregory Howell is a 62 y.o. male with medical history significant for cystoscopy CHF, stage II CKD, CAD, hypertension and nonischemic cardiomyopathy  as well as chronic fibrillation presented to the emergency room with worsening dyspnea over the last couple weeks with associated orthopnea that was up to 3-4 pillow orthopnea, PND and worsening lower extremity edema.   ED Course:  BP was 176/125 and later 159/125 with a pulse of 113 respiratory to 22 ,99% on 2 L of O2 by nasal cannula.  CMP revealed a BUN of creatinine 1.78 above previous levels and calcium 8.6, AST 46 ALT 45 direct bili of 0.9 and total bili of 2.7 with indirect bili 1.8.  High sensitive troponin I was 36 and later 49.  CBC showed leukocytosis of 11.8.  BNP 1107 EKG  showed atrial flutter/fibrillation with RVR of 114 with minimal voltage criteria for LVH and prolonged QT interval with QTc of 554 MS Imaging: Two-view chest x-ray showed mild central vascular congestion.  Patient was started on IV diuresis and cardiology was consulted.  7/23: Overnight worsening complaint of shortness of breath, saturating well on 2 L, ABG with pO2 of 76%, renal function stable,Leukocytosis resolved. Significantly distended belly,concern of ascites, CT renal stone study done last year with concern of hepatic cirrhosis.  Liver ultrasound ordered.  7/24: Mildly elevated blood pressure.  RUQ ultrasound without evidence of cholelithiasis, hepatomegaly without focal liver lesions.  No ascites.  UOP of 3125, slight worsening of renal function with creatinine at 1.97 , improving T. bili at 1.8.  Cardiology switched Lasix with 60 mg of torsemide from tomorrow.  Going for DCCV tomorrow morning.  7/25: Hemodynamically stable, slowly improving renal function.  Unable to pass TEE probe today so DCCV was postponed for outpatient.  Cardiology started him on amiodarone infusion.  7/26: Remains stable.  Amiodarone will be switched to p.o. from tomorrow  per cardiology.  They are recommending cardioversion in 2 to 3 weeks as outpatient, patient would like to follow-up at [facility].  7/27: Hemodynamically stable.  Renal function still above baseline.  Amiodarone infusion was discontinued and patient is being started on p.o. amiodarone which she will take 200 mg twice daily followed by once daily.  His home carvedilol dose was decreased to 12.5 mg by cardiology. He was also started on losartan.  He was advised to hold Lasix and losartan for a day and then started due to renal function.  Patient is being discharged as he wants to go home and follow-up with his on [place] cardiologist.  Patient will need a repeat renal function testing within next few days so their physician can make changes to his medications as appropriate.  He will continue on current medications and need to have a close follow-up with his providers for further recommendations.

## 2023-03-03 ENCOUNTER — Other Ambulatory Visit (HOSPITAL_COMMUNITY): Payer: Self-pay

## 2023-03-03 DIAGNOSIS — I5023 Acute on chronic systolic (congestive) heart failure: Secondary | ICD-10-CM | POA: Diagnosis not present

## 2023-03-03 DIAGNOSIS — I4892 Unspecified atrial flutter: Secondary | ICD-10-CM | POA: Diagnosis not present

## 2023-03-03 LAB — COMPREHENSIVE METABOLIC PANEL
ALT: 44 U/L (ref 0–44)
AST: 40 U/L (ref 15–41)
Albumin: 3.7 g/dL (ref 3.5–5.0)
Alkaline Phosphatase: 108 U/L (ref 38–126)
Anion gap: 4 — ABNORMAL LOW (ref 5–15)
BUN: 32 mg/dL — ABNORMAL HIGH (ref 8–23)
CO2: 24 mmol/L (ref 22–32)
Calcium: 8.1 mg/dL — ABNORMAL LOW (ref 8.9–10.3)
Chloride: 106 mmol/L (ref 98–111)
Creatinine, Ser: 1.97 mg/dL — ABNORMAL HIGH (ref 0.61–1.24)
GFR, Estimated: 37 mL/min — ABNORMAL LOW (ref >60)
Glucose, Bld: 111 mg/dL — ABNORMAL HIGH (ref 70–99)
Potassium: 4.1 mmol/L (ref 3.5–5.1)
Sodium: 134 mmol/L — ABNORMAL LOW (ref 135–145)
Total Bilirubin: 1.8 mg/dL — ABNORMAL HIGH (ref 0.3–1.2)
Total Protein: 7.3 g/dL (ref 6.5–8.1)

## 2023-03-03 LAB — MAGNESIUM: Magnesium: 2.3 mg/dL (ref 1.7–2.4)

## 2023-03-03 MED ORDER — FUROSEMIDE 10 MG/ML IJ SOLN: 40.0000 mg | Freq: Two times a day (BID) | INTRAMUSCULAR | Status: DC

## 2023-03-03 MED ORDER — MAGNESIUM SULFATE 2 GM/50ML IV SOLN
2.0000 g | Freq: Once | INTRAVENOUS | Status: AC
  Administered 2023-03-03: 2 g via INTRAVENOUS
  Filled 2023-03-03: qty 50

## 2023-03-03 MED ORDER — LOSARTAN POTASSIUM 50 MG PO TABS
50.0000 mg | ORAL_TABLET | Freq: Every day | ORAL | Status: DC
  Administered 2023-03-03 – 2023-03-06 (×4): 50 mg via ORAL
  Filled 2023-03-03 (×4): qty 1

## 2023-03-03 MED ORDER — TORSEMIDE 20 MG PO TABS
60.0000 mg | ORAL_TABLET | Freq: Every day | ORAL | Status: DC
  Administered 2023-03-04: 60 mg via ORAL
  Filled 2023-03-03: qty 3

## 2023-03-03 MED ORDER — CARVEDILOL 12.5 MG PO TABS
12.5000 mg | ORAL_TABLET | Freq: Two times a day (BID) | ORAL | Status: DC
  Administered 2023-03-03 – 2023-03-06 (×7): 12.5 mg via ORAL
  Filled 2023-03-03 (×7): qty 1

## 2023-03-03 NOTE — H&P (View-Only) (Signed)
ADVANCED HEART FAILURE CONSULT NOTE  Referring Physician: No ref. provider found  Primary Care: [facility]  HPI: Gregory Howell is a 62 y.o. male with HFrEF, atrial fibrillation, nonobstructive CAD currently admitted with HFrEF exacerbation 2/2 atrial fibrillation.   He reports being diagnosed with heart problems in April 2021.He reports becoming suddenly SOB at that time. He was admitted at [facility] for new onset atrial fibrillation w/ TTE demonstrating LVEF of 25%-30%. LHC at that time with mild nonobstructive CAD. He subsequently transferred his care to the [place] where he was cardioverted to NSR last year. Since that time he was doing fairly well until the past week when he started becoming increasingly short of breath with lower extremity edema, PND and orthopnea. He ran out of lasix 5 days ago also further exacerbating his symptoms.  He came to the [facility] emergency department where lab work was significant for BNP of 1100, high-sensitivity troponin of 49, EKG with atrial flutter with RVR and chest x-ray with mild pulmonary edema.  Interval hx:  - 3.1L urine output yesterday w/ slight rise in BUN/sCr (started losartan/jardiance) - Feels improvement in dyspnea today - telemetry with persistent AFL    PHYSICAL EXAM: Vitals:   03/03/23 0632 03/03/23 0739  BP: (!) 147/120 (!) 152/104  Pulse: (!) 108 84  Resp: 18 18  Temp: 98.7 F (37.1 C) 97.8 F (36.6 C)  SpO2: 96% 97%   GENERAL: Well nourished, well developed, and in no apparent distress at rest.  HEENT: Negative for arcus senilis or xanthelasma. There is no scleral icterus.  The mucous membranes are pink and moist.   NECK: Supple, No masses. Normal carotid upstrokes without bruits. No masses or thyromegaly.    CHEST: There are no chest wall deformities. There is no chest wall tenderness. Respirations are unlabored.  Lungs-decreased at bases CARDIAC:  JVP: 10 cm H2O         Normal S1, S2  Normal rate with irregular  rhythm. No murmurs, rubs or gallops.  Pulses are 2+ and symmetrical in upper and lower extremities.  +1 edema.  ABDOMEN: Soft, non-tender, non-distended. There are no masses or hepatomegaly. There are normal bowel sounds.  EXTREMITIES: Warm and well perfused with no cyanosis, clubbing.  LYMPHATIC: No axillary or supraclavicular lymphadenopathy.  NEUROLOGIC: Patient is oriented x3 with no focal or lateralizing neurologic deficits.  PSYCH: Patients affect is appropriate, there is no evidence of anxiety or depression.  SKIN: Warm and dry; no lesions or wounds.   DATA REVIEW  ECG: 03/02/23: atrial flutter with RVR  As per my personal interpretation  ECHO: 02/28/21: LVEF 30-35%, moderately reduced RV function  CATH: 12/07/19: 2nd Mrg lesion is 100% stenosed. Dist Cx lesion is 60% stenosed. 1st Mrg lesion is 40% stenosed. Ost RCA lesion is 50% stenosed. RPAV lesion is 60% stenosed. Mid RCA lesion is 30% stenosed. LV end diastolic pressure is mildly elevated. There is severe left ventricular systolic dysfunction. The left ventricular ejection fraction is less than 25% by visual estimate. Hemodynamic findings consistent with mild pulmonary hypertension.   ASSESSMENT & PLAN:  Acute on chronic heart failure with reduced ejection fraction -Doing fairly well until last week when he became suddenly short of breath.  Since that time has had progressively worsening lower extremity edema and abdominal wall protuberance.  Likely secondary to recurrence of atrial flutter/fibrillation with RVR.  Around this time he also ran out of Lasix. -Today on exam +1 edema in bilateral lower extremities, JVP  9 -Increase losartan to 50mg  daily. Continue jardiance 10mg . Patient has not been taking coreg at home. Will decrease dose to 12.5mg  BID.  -Start torsemide 80mg  daily -Continue apixaban 5 mg twice daily  2.  Atrial flutter with variable conduction -N.p.o. after midnight for TEE/cardioversion  tomorrow.    Advanced Heart Failure Mechanical Circulatory Support

## 2023-03-03 NOTE — TOC Progression Note (Signed)
Transition of Care [facility]) - Progression Note    Patient Details  Name: Gregory Howell MRN: [id number] Date of Birth: 1960/12/19  Transition of Care [facility]) CM/SW Contact  Truddie Hidden, RN Phone Number: 03/03/2023, 2:56 PM  Clinical Narrative:    TOC assessing for ongoing needs and discharge planning.        Expected Discharge Plan and Services                                               Social Determinants of Health (SDOH) Interventions SDOH Screenings   Food Insecurity: No Food Insecurity (03/03/2023)  Housing: Low Risk  (03/03/2023)  Transportation Needs: No Transportation Needs (03/03/2023)  Utilities: Not At Risk (03/03/2023)  Tobacco Use: High Risk (03/01/2023)    Readmission Risk Interventions    09/11/2021   11:50 AM  Readmission Risk Prevention Plan  Transportation Screening Complete  PCP or Specialist Appt within 3-5 Days Complete  Social Work Consult for Recovery Care Planning/Counseling Complete  Palliative Care Screening Not Applicable  Medication Review [employer]) Complete

## 2023-03-03 NOTE — TOC Benefit Eligibility Note (Signed)
Pharmacy Patient Advocate Encounter  Insurance verification completed.    The patient is insured through [employer] [facility]   Ran test claim for Farxiga 10 mg and Requires Prior Authorization  Ran test claim for Jardiance 10 mg and Requires Prior Authorization  This test claim was processed through [employer]- copay amounts may vary at other pharmacies due to [employer], or as the patient moves through the different stages of their insurance plan.    Roland Earl, CPHT Pharmacy Patient Advocate Specialist [facility] Patient Advocate Team Direct Number: [phone]  Fax: [phone]

## 2023-03-03 NOTE — Progress Notes (Signed)
ADVANCED HEART FAILURE CONSULT NOTE  Referring Physician: No ref. provider found  Primary Care: [facility]  HPI: Gregory Howell is a 62 y.o. male with HFrEF, atrial fibrillation, nonobstructive CAD currently admitted with HFrEF exacerbation 2/2 atrial fibrillation.   He reports being diagnosed with heart problems in April 2021.He reports becoming suddenly SOB at that time. He was admitted at [facility] for new onset atrial fibrillation w/ TTE demonstrating LVEF of 25%-30%. LHC at that time with mild nonobstructive CAD. He subsequently transferred his care to the [place] where he was cardioverted to NSR last year. Since that time he was doing fairly well until the past week when he started becoming increasingly short of breath with lower extremity edema, PND and orthopnea. He ran out of lasix 5 days ago also further exacerbating his symptoms.  He came to the [facility] emergency department where lab work was significant for BNP of 1100, high-sensitivity troponin of 49, EKG with atrial flutter with RVR and chest x-ray with mild pulmonary edema.  Interval hx:  - 3.1L urine output yesterday w/ slight rise in BUN/sCr (started losartan/jardiance) - Feels improvement in dyspnea today - telemetry with persistent AFL    PHYSICAL EXAM: Vitals:   03/03/23 0632 03/03/23 0739  BP: (!) 147/120 (!) 152/104  Pulse: (!) 108 84  Resp: 18 18  Temp: 98.7 F (37.1 C) 97.8 F (36.6 C)  SpO2: 96% 97%   GENERAL: Well nourished, well developed, and in no apparent distress at rest.  HEENT: Negative for arcus senilis or xanthelasma. There is no scleral icterus.  The mucous membranes are pink and moist.   NECK: Supple, No masses. Normal carotid upstrokes without bruits. No masses or thyromegaly.    CHEST: There are no chest wall deformities. There is no chest wall tenderness. Respirations are unlabored.  Lungs-decreased at bases CARDIAC:  JVP: 10 cm H2O         Normal S1, S2  Normal rate with irregular  rhythm. No murmurs, rubs or gallops.  Pulses are 2+ and symmetrical in upper and lower extremities.  +1 edema.  ABDOMEN: Soft, non-tender, non-distended. There are no masses or hepatomegaly. There are normal bowel sounds.  EXTREMITIES: Warm and well perfused with no cyanosis, clubbing.  LYMPHATIC: No axillary or supraclavicular lymphadenopathy.  NEUROLOGIC: Patient is oriented x3 with no focal or lateralizing neurologic deficits.  PSYCH: Patients affect is appropriate, there is no evidence of anxiety or depression.  SKIN: Warm and dry; no lesions or wounds.   DATA REVIEW  ECG: 03/02/23: atrial flutter with RVR  As per my personal interpretation  ECHO: 02/28/21: LVEF 30-35%, moderately reduced RV function  CATH: 12/07/19: 2nd Mrg lesion is 100% stenosed. Dist Cx lesion is 60% stenosed. 1st Mrg lesion is 40% stenosed. Ost RCA lesion is 50% stenosed. RPAV lesion is 60% stenosed. Mid RCA lesion is 30% stenosed. LV end diastolic pressure is mildly elevated. There is severe left ventricular systolic dysfunction. The left ventricular ejection fraction is less than 25% by visual estimate. Hemodynamic findings consistent with mild pulmonary hypertension.   ASSESSMENT & PLAN:  Acute on chronic heart failure with reduced ejection fraction -Doing fairly well until last week when he became suddenly short of breath.  Since that time has had progressively worsening lower extremity edema and abdominal wall protuberance.  Likely secondary to recurrence of atrial flutter/fibrillation with RVR.  Around this time he also ran out of Lasix. -Today on exam +1 edema in bilateral lower extremities, JVP  9 -Increase losartan to 50mg  daily. Continue jardiance 10mg . Patient has not been taking coreg at home. Will decrease dose to 12.5mg  BID.  -Start torsemide 80mg  daily -Continue apixaban 5 mg twice daily  2.  Atrial flutter with variable conduction -N.p.o. after midnight for TEE/cardioversion  tomorrow.  Darrold Bezek Advanced Heart Failure Mechanical Circulatory Support

## 2023-03-03 NOTE — Assessment & Plan Note (Addendum)
Slight worsening of creatinine to 2.04 today.  Appears to have baseline CKD stage IIIa with creatinine around 1.5-1.7 -Hold torsemide tomorrow -Monitor renal function while patient is being diuresed -Avoid nephrotoxins

## 2023-03-03 NOTE — Plan of Care (Signed)

## 2023-03-03 NOTE — Assessment & Plan Note (Signed)
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 -Liver ultrasound with fatty liver and no ascites. -Continue with diuresis

## 2023-03-03 NOTE — Assessment & Plan Note (Addendum)
Prior echocardiogram with EF of 30 to 35% done in July 2022. Patient apparently ran out of Lasix for the past 6 few days. Cardiology was consulted.  Diuresed well with slight improvement in creatinine today -IV Lasix was switched switched with torsemide, holding for 1 to 2 days due worsening creatinine -Losartan dose was increased to 50 mg daily by cardiology -Daily weight and BMP -Strict intake and output

## 2023-03-03 NOTE — Progress Notes (Signed)
Progress Note   Patient: Gregory Howell ZOX:[id number] DOB: [birth year] DOA: 03/01/2023     2 DOS: the patient was seen and examined on 03/03/2023   Brief hospital course: Taken from H&P.  Gregory Howell is a 62 y.o. male with medical history significant for cystoscopy CHF, stage II CKD, CAD, hypertension and nonischemic cardiomyopathy  as well as chronic fibrillation presented to the emergency room with worsening dyspnea over the last couple weeks with associated orthopnea that was up to 3-4 pillow orthopnea, PND and worsening lower extremity edema.   ED Course:  BP was 176/125 and later 159/125 with a pulse of 113 respiratory to 22 ,99% on 2 L of O2 by nasal cannula.  CMP revealed a BUN of creatinine 1.78 above previous levels and calcium 8.6, AST 46 ALT 45 direct bili of 0.9 and total bili of 2.7 with indirect bili 1.8.  High sensitive troponin I was 36 and later 49.  CBC showed leukocytosis of 11.8.  BNP 1107 EKG  showed atrial flutter/fibrillation with RVR of 114 with minimal voltage criteria for LVH and prolonged QT interval with QTc of 554 MS Imaging: Two-view chest x-ray showed mild central vascular congestion.  Patient was started on IV diuresis and cardiology was consulted.  7/23: Overnight worsening complaint of shortness of breath, saturating well on 2 L, ABG with pO2 of 76%, renal function stable,Leukocytosis resolved. Significantly distended belly,concern of ascites, CT renal stone study done last year with concern of hepatic cirrhosis.  Liver ultrasound ordered.  7/24: Mildly elevated blood pressure.  RUQ ultrasound without evidence of cholelithiasis, hepatomegaly without focal liver lesions.  No ascites.  UOP of 3125, slight worsening of renal function with creatinine at 1.97 , improving T. bili at 1.8.  Cardiology switched Lasix with 60 mg of torsemide from tomorrow.  Going for DCCV tomorrow morning.   Assessment and Plan: * Acute on chronic systolic CHF (congestive heart  failure) (HCC) Prior echocardiogram with EF of 30 to 35% done in July 2022. Patient apparently ran out of Lasix for the past 6 few days. Cardiology was consulted.  Diuresed well with slight increase in creatinine -IV Lasix switched with torsemide from tomorrow -Losartan dose was increased to 50 mg daily by cardiology -Daily weight and BMP -Strict intake and output    Acute kidney injury superimposed on chronic kidney disease (HCC) Slight worsening of creatinine with diuresis today. -Monitor renal function while patient is being diuresed -Avoid nephrotoxins  Chronic atrial fibrillation with RVR (HCC) Persistent atrial flutter with RVR -Cardiology is planning DCCV tomorrow - We will continue Eliquis and Coreg.   Elevated LFTs CT renal stone study done last year with concern of liver cirrhosis. -Liver ultrasound with fatty liver and no ascites. -Continue with diuresis  Hypertensive urgency Blood pressure within goal. -Continue Coreg -Low-dose losartan was added by cardiology -Patient is being diuresed with IV Lasix   Subjective: Patient was complaining of cramping involving the left side of body, stating that each time he takes Lasix he gets these cramps.  Asking for magnesium and potassium replacement as it always helps.  No chest pain.  Physical Exam: Vitals:   03/03/23 0632 03/03/23 0739 03/03/23 1109 03/03/23 1112  BP: (!) 147/120 (!) 152/104 (!) 123/102 (!) 135/92  Pulse: (!) 108 84 (!) 33 85  Resp: 18 18 20    Temp: 98.7 F (37.1 C) 97.8 F (36.6 C) 98.2 F (36.8 C)   TempSrc:      SpO2: 96% 97% 98% 96%  Weight:  102.7 kg    Height:       General.  Well-developed gentleman, in no acute distress. Pulmonary.  Lungs clear bilaterally, normal respiratory effort. CV.  Irregularly irregular. Abdomen.  Soft, nontender, nondistended, BS positive. CNS.  Alert and oriented .  No focal neurologic deficit. Extremities.  No edema, no cyanosis, pulses intact and  symmetrical. Psychiatry.  Judgment and insight appears normal.   Data Reviewed: Prior data reviewed  Family Communication: Discussed with patient  Disposition: Status is: Inpatient Remains inpatient appropriate because: Severity of illness  Planned Discharge Destination: Home  DVT prophylaxis.  Eliquis Time spent: 45 minutes  This record has been created using Conservation officer, historic buildings. Errors have been sought and corrected,but may not always be located. Such creation errors do not reflect on the standard of care.   Author: Arnetha Courser, MD 03/03/2023 1:31 PM  For on call review [email or web address].

## 2023-03-04 ENCOUNTER — Inpatient Hospital Stay: Payer: No Typology Code available for payment source | Admitting: Anesthesiology

## 2023-03-04 ENCOUNTER — Encounter
Admission: EM | Disposition: A | Discharge status: Home / Self Care | Payer: Self-pay | Source: Home / Self Care | Attending: Internal Medicine

## 2023-03-04 ENCOUNTER — Other Ambulatory Visit: Payer: Self-pay

## 2023-03-04 ENCOUNTER — Inpatient Hospital Stay
Admit: 2023-03-04 | Discharge: 2023-03-04 | Disposition: A | Discharge status: Home / Self Care | Payer: No Typology Code available for payment source | Attending: Medical | Admitting: Medical

## 2023-03-04 ENCOUNTER — Inpatient Hospital Stay (HOSPITAL_COMMUNITY)
Admit: 2023-03-04 | Discharge: 2023-03-04 | Disposition: A | Discharge status: Home / Self Care | Payer: No Typology Code available for payment source | Attending: Cardiology | Admitting: Cardiology

## 2023-03-04 DIAGNOSIS — I4892 Unspecified atrial flutter: Secondary | ICD-10-CM | POA: Diagnosis not present

## 2023-03-04 DIAGNOSIS — I428 Other cardiomyopathies: Secondary | ICD-10-CM | POA: Diagnosis not present

## 2023-03-04 DIAGNOSIS — I5023 Acute on chronic systolic (congestive) heart failure: Secondary | ICD-10-CM | POA: Diagnosis not present

## 2023-03-04 HISTORY — PX: CARDIOVERSION: SHX1299

## 2023-03-04 HISTORY — PX: TEE WITHOUT CARDIOVERSION: SHX5443

## 2023-03-04 LAB — BASIC METABOLIC PANEL
Anion gap: 10 (ref 5–15)
BUN: 31 mg/dL — ABNORMAL HIGH (ref 8–23)
Calcium: 8.3 mg/dL — ABNORMAL LOW (ref 8.9–10.3)
Chloride: 99 mmol/L (ref 98–111)
Creatinine, Ser: 1.82 mg/dL — ABNORMAL HIGH (ref 0.61–1.24)
GFR, Estimated: 41 mL/min — ABNORMAL LOW (ref >60)
Glucose, Bld: 94 mg/dL (ref 70–99)
Sodium: 136 mmol/L (ref 135–145)

## 2023-03-04 LAB — ECHOCARDIOGRAM LIMITED
Calc EF: 12.7 %
Est EF: <20
Height: 69 in
S' Lateral: 5.6 cm
Single Plane A2C EF: 7.7 %
Single Plane A4C EF: 12.7 %
Weight: 3920 oz

## 2023-03-04 SURGERY — ECHOCARDIOGRAM, TRANSESOPHAGEAL
Anesthesia: General

## 2023-03-04 MED ORDER — MIDAZOLAM HCL 2 MG/2ML IJ SOLN
INTRAMUSCULAR | Status: DC | PRN
  Administered 2023-03-04: 2 mg via INTRAVENOUS

## 2023-03-04 MED ORDER — SODIUM CHLORIDE 0.9 % IV SOLN: INTRAVENOUS | Status: DC

## 2023-03-04 MED ORDER — LIDOCAINE VISCOUS HCL 2 % MT SOLN
15.0000 mL | Freq: Four times a day (QID) | OROMUCOSAL | Status: DC | PRN
  Administered 2023-03-04 (×2): 15 mL via OROMUCOSAL
  Filled 2023-03-04 (×3): qty 15

## 2023-03-04 MED ORDER — AMIODARONE HCL IN DEXTROSE 360-4.14 MG/200ML-% IV SOLN
60.0000 mg/h | INTRAVENOUS | Status: AC
  Administered 2023-03-04 (×2): 60 mg/h via INTRAVENOUS
  Filled 2023-03-04 (×2): qty 200

## 2023-03-04 MED ORDER — LIDOCAINE VISCOUS HCL 2 % MT SOLN
OROMUCOSAL | Status: AC
  Filled 2023-03-04: qty 15

## 2023-03-04 MED ORDER — AMIODARONE LOAD VIA INFUSION
150.0000 mg | Freq: Once | INTRAVENOUS | Status: AC
  Administered 2023-03-04: 150 mg via INTRAVENOUS
  Filled 2023-03-04: qty 83.34

## 2023-03-04 MED ORDER — AMIODARONE HCL IN DEXTROSE 360-4.14 MG/200ML-% IV SOLN
30.0000 mg/h | INTRAVENOUS | Status: DC
  Administered 2023-03-04 – 2023-03-06 (×4): 30 mg/h via INTRAVENOUS
  Filled 2023-03-04 (×4): qty 200

## 2023-03-04 MED ORDER — BUTAMBEN-TETRACAINE-BENZOCAINE 2-2-14 % EX AERO
INHALATION_SPRAY | CUTANEOUS | Status: AC
  Filled 2023-03-04: qty 5

## 2023-03-04 MED ORDER — MIDAZOLAM HCL 2 MG/2ML IJ SOLN
INTRAMUSCULAR | Status: AC
  Filled 2023-03-04: qty 2

## 2023-03-04 MED ORDER — PROPOFOL 10 MG/ML IV BOLUS
INTRAVENOUS | Status: DC | PRN
  Administered 2023-03-04 (×2): 20 mg via INTRAVENOUS
  Administered 2023-03-04: 40 mg via INTRAVENOUS
  Administered 2023-03-04: 20 mg via INTRAVENOUS
  Administered 2023-03-04: 30 mg via INTRAVENOUS

## 2023-03-04 MED ORDER — PHENOL 1.4 % MT LIQD
1.0000 | OROMUCOSAL | Status: DC | PRN
  Filled 2023-03-04: qty 177

## 2023-03-04 NOTE — Progress Notes (Signed)
ADVANCED HEART FAILURE CONSULT NOTE  Referring Physician: No ref. provider found  Primary Care: [facility]  HPI: Gregory Howell is a 62 y.o. male with HFrEF, atrial fibrillation, nonobstructive CAD currently admitted with HFrEF exacerbation 2/2 atrial fibrillation.   He reports being diagnosed with heart problems in April 2021.He reports becoming suddenly SOB at that time. He was admitted at [facility] for new onset atrial fibrillation w/ TTE demonstrating LVEF of 25%-30%. LHC at that time with mild nonobstructive CAD. He subsequently transferred his care to the [place] where he was cardioverted to NSR last year. Since that time he was doing fairly well until the past week when he started becoming increasingly short of breath with lower extremity edema, PND and orthopnea. He ran out of lasix 5 days ago also further exacerbating his symptoms.  He came to the [facility] emergency department where lab work was significant for BNP of 1100, high-sensitivity troponin of 49, EKG with atrial flutter with RVR and chest x-ray with mild pulmonary edema.  Interval hx:  - sCr down to 1.8 today; I/Os & weights not accurately reported.   PHYSICAL EXAM: Vitals:   03/04/23 1315 03/04/23 1330  BP: (!) 136/107 (!) 126/99  Pulse: 81 94  Resp: 16 (!) 9  Temp:    SpO2: 95% 93%   GENERAL: Well nourished, well developed, and in no apparent distress at rest.  HEENT: Negative for arcus senilis or xanthelasma. There is no scleral icterus.  The mucous membranes are pink and moist.   NECK: Supple, No masses. Normal carotid upstrokes without bruits. No masses or thyromegaly.    CHEST: There are no chest wall deformities. There is no chest wall tenderness. Respirations are unlabored.  Lungs-decreased at bases CARDIAC:  JVP: 8-9         Normal S1, S2  Normal rate with irregular rhythm. No murmurs, rubs or gallops.  Pulses are 2+ and symmetrical in upper and lower extremities.  +1 edema.  ABDOMEN: Soft, non-tender,  non-distended. There are no masses or hepatomegaly. There are normal bowel sounds.  EXTREMITIES: Warm and well perfused with no cyanosis, clubbing.  LYMPHATIC: No axillary or supraclavicular lymphadenopathy.  NEUROLOGIC: Patient is oriented x3 with no focal or lateralizing neurologic deficits.  PSYCH: Patients affect is appropriate, there is no evidence of anxiety or depression.  SKIN: Warm and dry; no lesions or wounds.   DATA REVIEW  ECG: 03/02/23: atrial flutter with RVR  As per my personal interpretation  ECHO: 02/28/21: LVEF 30-35%, moderately reduced RV function  CATH: 12/07/19: 2nd Mrg lesion is 100% stenosed. Dist Cx lesion is 60% stenosed. 1st Mrg lesion is 40% stenosed. Ost RCA lesion is 50% stenosed. RPAV lesion is 60% stenosed. Mid RCA lesion is 30% stenosed. LV end diastolic pressure is mildly elevated. There is severe left ventricular systolic dysfunction. The left ventricular ejection fraction is less than 25% by visual estimate. Hemodynamic findings consistent with mild pulmonary hypertension.   ASSESSMENT & PLAN:  Acute on chronic heart failure with reduced ejection fraction -Doing fairly well until last week when he became suddenly short of breath.  Since that time has had progressively worsening lower extremity edema and abdominal wall protuberance.  Likely secondary to recurrence of atrial flutter/fibrillation with RVR.  Around this time he also ran out of Lasix. -Increase losartan to 50mg  daily. Continue jardiance 10mg . Patient has not been taking coreg at home. Continue 12.5mg  BID.  -Continue torsemide 80mg  daily -Continue apixaban 5 mg twice daily  2.  Atrial flutter with variable conduction -Unable to pass TEE probe today; high risk for HFrEF decompensation with continued AFL/Afib. Will start amio gtt. Discussed risks/benefits with patient. Will plan for outpatient TEE/DCCV after 4 weeks of AC.   Keil Pickering Advanced Heart Failure Mechanical  Circulatory Support

## 2023-03-04 NOTE — CV Procedure (Signed)
   TRANSESOPHAGEAL ECHOCARDIOGRAM GUIDED DIRECT CURRENT CARDIOVERSION  NAME:  Weber Monnier    MRN: [id number] DOB:  07/08/1961    ADMIT DATE: 03/01/2023  INDICATIONS: Symptomatic atrial flutter/fibrillation  PROCEDURE:   Informed consent was obtained prior to the procedure. The risks, benefits and alternatives for the procedure were discussed and the patient comprehended these risks.  Risks include, but are not limited to, cough, sore throat, vomiting, nausea, somnolence, esophageal and stomach trauma or perforation, bleeding, low blood pressure, aspiration, pneumonia, infection, trauma to the teeth and death.    After a procedural time-out, the oropharynx was anesthetized and the patient was sedated by the anesthesia service. Sedation by anesthesia.  Despite multiple attempts the TEE probe could not be passed into the esophagus. The procedure was aborted.   COMPLICATIONS:    Complications: No complications Patient tolerated procedure well.  Robert Sperl Advanced Heart Failure 1:17 PM

## 2023-03-04 NOTE — Progress Notes (Signed)
Progress Note   Patient: Gregory Howell NGE:[id number] DOB: 30-Dec-1960 DOA: 03/01/2023     3 DOS: the patient was seen and examined on 03/04/2023   Brief hospital course: Taken from H&P.  Gregory Howell is a 62 y.o. male with medical history significant for cystoscopy CHF, stage II CKD, CAD, hypertension and nonischemic cardiomyopathy  as well as chronic fibrillation presented to the emergency room with worsening dyspnea over the last couple weeks with associated orthopnea that was up to 3-4 pillow orthopnea, PND and worsening lower extremity edema.   ED Course:  BP was 176/125 and later 159/125 with a pulse of 113 respiratory to 22 ,99% on 2 L of O2 by nasal cannula.  CMP revealed a BUN of creatinine 1.78 above previous levels and calcium 8.6, AST 46 ALT 45 direct bili of 0.9 and total bili of 2.7 with indirect bili 1.8.  High sensitive troponin I was 36 and later 49.  CBC showed leukocytosis of 11.8.  BNP 1107 EKG  showed atrial flutter/fibrillation with RVR of 114 with minimal voltage criteria for LVH and prolonged QT interval with QTc of 554 MS Imaging: Two-view chest x-ray showed mild central vascular congestion.  Patient was started on IV diuresis and cardiology was consulted.  7/23: Overnight worsening complaint of shortness of breath, saturating well on 2 L, ABG with pO2 of 76%, renal function stable,Leukocytosis resolved. Significantly distended belly,concern of ascites, CT renal stone study done last year with concern of hepatic cirrhosis.  Liver ultrasound ordered.  7/24: Mildly elevated blood pressure.  RUQ ultrasound without evidence of cholelithiasis, hepatomegaly without focal liver lesions.  No ascites.  UOP of 3125, slight worsening of renal function with creatinine at 1.97 , improving T. bili at 1.8.  Cardiology switched Lasix with 60 mg of torsemide from tomorrow.  Going for DCCV tomorrow morning.  7/25: Hemodynamically stable, slowly improving renal function.  Unable to pass  TEE probe today so DCCV was postponed for outpatient.  Cardiology started him on amiodarone infusion.   Assessment and Plan: * Acute on chronic systolic CHF (congestive heart failure) (HCC) Prior echocardiogram with EF of 30 to 35% done in July 2022. Patient apparently ran out of Lasix for the past 6 few days. Cardiology was consulted.  Diuresed well with slight improvement in creatinine today -IV Lasix was switched switched with torsemide  -Losartan dose was increased to 50 mg daily by cardiology -Daily weight and BMP -Strict intake and output    Acute kidney injury superimposed on chronic kidney disease (HCC) Slight slight improvement of creatinine to 1.82 today -Monitor renal function while patient is being diuresed -Avoid nephrotoxins  Chronic atrial fibrillation with RVR (HCC) Persistent atrial flutter with RVR.  Unable to pass TEE probe so DCCV got canceled Cardiology started him on amiodarone infusion. They might plan for DCCV as outpatient if needed - We will continue Eliquis and Coreg.   Elevated LFTs CT renal stone study done last year with concern of liver cirrhosis. -Liver ultrasound with fatty liver and no ascites. -Continue with diuresis  Hypertensive urgency Blood pressure within goal. -Continue Coreg -Low-dose losartan was added by cardiology -Patient is being diuresed with IV Lasix   Subjective: Patient was seen and examined today.  No new concern.  Seems improving.  He was awaiting for his procedure.  Physical Exam: Vitals:   03/04/23 1256 03/04/23 1300 03/04/23 1315 03/04/23 1330  BP: (!) 138/92 137/87 (!) 136/107 (!) 126/99  Pulse: 92 100 81 94  Resp:  15 20 16  (!) 9  Temp:      TempSrc:      SpO2: 97% 98% 95% 93%  Weight:      Height:       General.  Obese gentleman, in no acute distress. Pulmonary.  Lungs clear bilaterally, normal respiratory effort. CV.  Irregularly irregular Abdomen.  Soft, nontender, nondistended, BS positive. CNS.   Alert and oriented .  No focal neurologic deficit. Extremities.  No edema, no cyanosis, pulses intact and symmetrical. Psychiatry.  Judgment and insight appears normal.   Data Reviewed: Prior data reviewed  Family Communication: Discussed with patient  Disposition: Status is: Inpatient Remains inpatient appropriate because: Severity of illness  Planned Discharge Destination: Home  DVT prophylaxis.  Eliquis Time spent: 44 minutes  This record has been created using Conservation officer, historic buildings. Errors have been sought and corrected,but may not always be located. Such creation errors do not reflect on the standard of care.   Author: Arnetha Courser, MD 03/04/2023 3:24 PM  For on call review [email or web address].

## 2023-03-04 NOTE — Anesthesia Preprocedure Evaluation (Signed)
Anesthesia Evaluation  Patient identified by MRN, date of birth, ID band Patient awake    Reviewed: Allergy & Precautions, NPO status , Patient's Chart, lab work & pertinent test results  History of Anesthesia Complications Negative for: history of anesthetic complications  Airway Mallampati: II  TM Distance: >3 FB Neck ROM: Full    Dental no notable dental hx. (+) Teeth Intact   Pulmonary neg sleep apnea, neg COPD, Current Smoker and Patient abstained from smoking.   Pulmonary exam normal breath sounds clear to auscultation       Cardiovascular Exercise Tolerance: Good METShypertension, + CAD and +CHF  (-) Past MI + dysrhythmias Atrial Fibrillation  Rhythm:Regular Rate:Normal - Systolic murmurs    Neuro/Psych negative neurological ROS  negative psych ROS   GI/Hepatic ,neg GERD  ,,(+)     (-) substance abuse    Endo/Other  neg diabetes    Renal/GU CRFRenal disease     Musculoskeletal   Abdominal  (+) + obese  Peds  Hematology   Anesthesia Other Findings Past Medical History: No date: CHF (congestive heart failure) (HCC) No date: CKD (chronic kidney disease), stage II No date: Coronary artery disease, non-occlusive No date: HFrEF (heart failure with reduced ejection fraction) (HCC) No date: Hypertension No date: Hypertensive heart disease No date: NICM (nonischemic cardiomyopathy) (HCC) No date: Persistent atrial fibrillation (HCC)  Reproductive/Obstetrics                             Anesthesia Physical Anesthesia Plan  ASA: 3  Anesthesia Plan: General   Post-op Pain Management: Minimal or no pain anticipated   Induction: Intravenous  PONV Risk Score and Plan: 1 and Propofol infusion, TIVA, Ondansetron and Midazolam  Airway Management Planned: Nasal Cannula  Additional Equipment: None  Intra-op Plan:   Post-operative Plan:   Informed Consent: I have reviewed the  patients History and Physical, chart, labs and discussed the procedure including the risks, benefits and alternatives for the proposed anesthesia with the patient or authorized representative who has indicated his/her understanding and acceptance.     Dental advisory given  Plan Discussed with: CRNA and Surgeon  Anesthesia Plan Comments: (Discussed risks of anesthesia with patient, including possibility of difficulty with spontaneous ventilation under anesthesia necessitating airway intervention, PONV, and rare risks such as cardiac or respiratory or neurological events, and allergic reactions. Discussed the role of CRNA in patient's perioperative care. Patient understands.)       Anesthesia Quick Evaluation

## 2023-03-04 NOTE — Anesthesia Postprocedure Evaluation (Signed)
Anesthesia Post Note  Patient: Gregory Howell  Procedure(s) Performed: TRANSESOPHAGEAL ECHOCARDIOGRAM CARDIOVERSION  Anesthesia Type: General Anesthetic complications: no   No notable events documented. Procedure cancelled. Not able to get the TEE probe down the esophagus.    Last Vitals:  Vitals:   03/04/23 1236 03/04/23 1237  BP: (!) 131/104   Pulse:    Resp: (!) 23 (!) 26  Temp:    SpO2: 97%     Last Pain:  Vitals:   03/04/23 1153  TempSrc: Oral  PainSc: 0-No pain                 Monico Hoar

## 2023-03-04 NOTE — Plan of Care (Signed)

## 2023-03-04 NOTE — Progress Notes (Addendum)
Soft (air filled appearing) lumps (approx 2 inch in diameter) noted to base of neck billateral to trachea, pt reflects no pain on palpation, pt has no appearance of resp distress  Dr Darvin Neighbours notified  Addendum: lumps noted to [facility], RN

## 2023-03-04 NOTE — Transfer of Care (Signed)
Immediate Anesthesia Transfer of Care Note  Patient: Gregory Howell  Procedure(s) Performed: TRANSESOPHAGEAL ECHOCARDIOGRAM CARDIOVERSION  Patient Location: [facility]  Anesthesia Type:General  Level of Consciousness: awake and alert   Airway & Oxygen Therapy: Patient Spontanous Breathing and Patient connected to nasal cannula oxygen  Post-op Assessment: Report given to RN and Post -op Vital signs reviewed and stable  Post vital signs: Reviewed and stable  Last Vitals:  Vitals Value Taken Time  BP 147/118 03/04/23 1245  Temp    Pulse 97 03/04/23 1246  Resp 24 03/04/23 1248  SpO2 88 % 03/04/23 1246  Vitals shown include unfiled device data.  Last Pain:  Vitals:   03/04/23 1153  TempSrc: Oral  PainSc: 0-No pain         Complications: No notable events documented.

## 2023-03-04 NOTE — Progress Notes (Signed)
*  PRELIMINARY RESULTS* Echocardiogram 2D Echocardiogram has been performed.  Gregory Howell 03/04/2023, 1:17 PM

## 2023-03-04 NOTE — Assessment & Plan Note (Addendum)
Persistent atrial flutter with RVR.  Unable to pass TEE probe so DCCV got canceled Cardiology started him on amiodarone infusion. They might plan for DCCV as outpatient if needed - We will continue Eliquis and Coreg. -Amiodarone will be converted to p.o. with a taper from tomorrow -[facility]

## 2023-03-04 NOTE — Interval H&P Note (Signed)
History and Physical Interval Note:  03/04/2023 12:06 PM  Gregory Howell  has presented today for surgery, with the diagnosis of atrial flutter.  The various methods of treatment have been discussed with the patient and family. After consideration of risks, benefits and other options for treatment, the patient has consented to  Procedure(s): TRANSESOPHAGEAL ECHOCARDIOGRAM (N/A) CARDIOVERSION (N/A) as a surgical intervention.  The patient's history has been reviewed, patient examined, no change in status, stable for surgery.  I have reviewed the patient's chart and labs.  Questions were answered to the patient's satisfaction.     Suzannah Bettes

## 2023-03-05 ENCOUNTER — Encounter: Payer: Self-pay | Admitting: Cardiology

## 2023-03-05 DIAGNOSIS — I5023 Acute on chronic systolic (congestive) heart failure: Secondary | ICD-10-CM | POA: Diagnosis not present

## 2023-03-05 DIAGNOSIS — I4892 Unspecified atrial flutter: Secondary | ICD-10-CM | POA: Diagnosis not present

## 2023-03-05 LAB — BASIC METABOLIC PANEL
Anion gap: 12 (ref 5–15)
BUN: 32 mg/dL — ABNORMAL HIGH (ref 8–23)
CO2: 25 mmol/L (ref 22–32)
Calcium: 8.5 mg/dL — ABNORMAL LOW (ref 8.9–10.3)
Chloride: 100 mmol/L (ref 98–111)
Creatinine, Ser: 2.04 mg/dL — ABNORMAL HIGH (ref 0.61–1.24)
GFR, Estimated: 36 mL/min — ABNORMAL LOW (ref >60)
Glucose, Bld: 126 mg/dL — ABNORMAL HIGH (ref 70–99)
Potassium: 3.5 mmol/L (ref 3.5–5.1)
Sodium: 137 mmol/L (ref 135–145)

## 2023-03-05 MED ORDER — HYALURONIDASE HUMAN 150 UNIT/ML IJ SOLN
150.0000 [IU] | Freq: Once | INTRAMUSCULAR | Status: AC
  Administered 2023-03-05: 150 [IU] via SUBCUTANEOUS
  Filled 2023-03-05: qty 1

## 2023-03-05 MED ORDER — TORSEMIDE 20 MG PO TABS
40.0000 mg | ORAL_TABLET | Freq: Every day | ORAL | Status: DC
  Administered 2023-03-05: 40 mg via ORAL
  Filled 2023-03-05: qty 2

## 2023-03-05 NOTE — Progress Notes (Signed)
ADVANCED HEART FAILURE CONSULT NOTE  Referring Physician: No ref. provider found  Primary Care: [facility]  HPI: Gregory Howell is a 62 y.o. male with HFrEF, atrial fibrillation, nonobstructive CAD currently admitted with HFrEF exacerbation 2/2 atrial fibrillation.   He reports being diagnosed with heart problems in April 2021.He reports becoming suddenly SOB at that time. He was admitted at [facility] for new onset atrial fibrillation w/ TTE demonstrating LVEF of 25%-30%. LHC at that time with mild nonobstructive CAD. He subsequently transferred his care to the [place] where he was cardioverted to NSR last year. Since that time he was doing fairly well until the past week when he started becoming increasingly short of breath with lower extremity edema, PND and orthopnea. He ran out of lasix 5 days ago also further exacerbating his symptoms.  He came to the [facility] emergency department where lab work was significant for BNP of 1100, high-sensitivity troponin of 49, EKG with atrial flutter with RVR and chest x-ray with mild pulmonary edema.  Interval hx:  -Serum creatinine up to 2.04 today.  From a functional standpoint he now feels at his baseline.  PHYSICAL EXAM: Vitals:   03/05/23 0401 03/05/23 0735  BP: 127/80 (!) 158/99  Pulse: 84 (!) 53  Resp: 20 20  Temp: (!) 97.5 F (36.4 C) (!) 97.3 F (36.3 C)  SpO2: 95% 100%   GENERAL: Well nourished, well developed, and in no apparent distress at rest.  HEENT: Negative for arcus senilis or xanthelasma. There is no scleral icterus.  The mucous membranes are pink and moist.   NECK: Supple, No masses. Normal carotid upstrokes without bruits. No masses or thyromegaly.    CHEST: There are no chest wall deformities. There is no chest wall tenderness. Respirations are unlabored.  Lungs-decreased at bases CARDIAC:  JVP: 8-9         Normal S1, S2  Normal rate with irregular rhythm. No murmurs, rubs or gallops.  Pulses are 2+ and symmetrical in  upper and lower extremities.  No edema ABDOMEN: Soft, non-tender, non-distended. There are no masses or hepatomegaly. There are normal bowel sounds.  EXTREMITIES: Warm and well-perfused LYMPHATIC: No axillary or supraclavicular lymphadenopathy.  NEUROLOGIC: Patient is oriented x3 with no focal or lateralizing neurologic deficits.  PSYCH: Patients affect is appropriate, there is no evidence of anxiety or depression.  SKIN: Warm and dry; no lesions or wounds.   DATA REVIEW  ECG: 03/02/23: atrial flutter with RVR  As per my personal interpretation  ECHO: 02/28/21: LVEF 30-35%, moderately reduced RV function  CATH: 12/07/19: 2nd Mrg lesion is 100% stenosed. Dist Cx lesion is 60% stenosed. 1st Mrg lesion is 40% stenosed. Ost RCA lesion is 50% stenosed. RPAV lesion is 60% stenosed. Mid RCA lesion is 30% stenosed. LV end diastolic pressure is mildly elevated. There is severe left ventricular systolic dysfunction. The left ventricular ejection fraction is less than 25% by visual estimate. Hemodynamic findings consistent with mild pulmonary hypertension.   ASSESSMENT & PLAN:  Acute on chronic heart failure with reduced ejection fraction -Doing fairly well until last week when he became suddenly short of breath.  Since that time has had progressively worsening lower extremity edema and abdominal wall protuberance.  Likely secondary to recurrence of atrial flutter/fibrillation with RVR.  Around this time he also ran out of Lasix. -Continue losartan to 50mg  daily. Continue jardiance 10mg . Patient has not been taking coreg at home. Continue 12.5mg  BID.  -Continue apixaban 5 mg twice daily -  Hold torsemide tomorrow.  Can discharge home tomorrow on Lasix 40 mg daily, losartan 50 mg, Jardiance 10 mg, carvedilol 12.5 mg twice daily after completion of amiodarone infusion.  Patient wishes to follow-up at the [place].  2.  Atrial flutter with variable conduction -Unable to pass TEE probe yesterday; high  risk for HFrEF decompensation with continued AFL/Afib.  -Discharge on amiodarone taper tomorrow: Amiodarone 400 mg twice daily for 7 days followed by 200 mg daily. -Plan for cardioversion after 4 weeks of uninterrupted anticoagulation.  [facility]  [name] Advanced Heart Failure Mechanical Circulatory Support

## 2023-03-05 NOTE — TOC Progression Note (Signed)
Transition of Care [facility]) - Progression Note    Patient Details  Name: Gregory Howell MRN: [id number] Date of Birth: [birth year]  [facility] CM/SW Contact  Truddie Hidden, RN Phone Number: 03/05/2023, 2:20 PM  Clinical Narrative:    Spoke with patient at the bedside per his request.  He is requesting a transfer to the [facility]. Patient stated "They couldn't get that tube in my throat, that's unacceptable and I want to be moved to the [place] in [place]." MD notified.         Expected Discharge Plan and Services                                               Social Determinants of Health (SDOH) Interventions SDOH Screenings   Food Insecurity: No Food Insecurity (03/03/2023)  Housing: Low Risk  (03/03/2023)  Transportation Needs: No Transportation Needs (03/03/2023)  Utilities: Not At Risk (03/03/2023)  Tobacco Use: High Risk (03/01/2023)    Readmission Risk Interventions    09/11/2021   11:50 AM  Readmission Risk Prevention Plan  Transportation Screening Complete  PCP or Specialist Appt within 3-5 Days Complete  Social Work Consult for Recovery Care Planning/Counseling Complete  Palliative Care Screening Not Applicable  Medication Review Oceanographer) Complete

## 2023-03-05 NOTE — Progress Notes (Signed)
Progress Note   Patient: Gregory Howell ZOX:[id number] DOB: [birth year] DOA: 03/01/2023     4 DOS: the patient was seen and examined on 03/05/2023   Brief hospital course: Taken from H&P.  Gregory Howell is a 62 y.o. male with medical history significant for cystoscopy CHF, stage II CKD, CAD, hypertension and nonischemic cardiomyopathy  as well as chronic fibrillation presented to the emergency room with worsening dyspnea over the last couple weeks with associated orthopnea that was up to 3-4 pillow orthopnea, PND and worsening lower extremity edema.   ED Course:  BP was 176/125 and later 159/125 with a pulse of 113 respiratory to 22 ,99% on 2 L of O2 by nasal cannula.  CMP revealed a BUN of creatinine 1.78 above previous levels and calcium 8.6, AST 46 ALT 45 direct bili of 0.9 and total bili of 2.7 with indirect bili 1.8.  High sensitive troponin I was 36 and later 49.  CBC showed leukocytosis of 11.8.  BNP 1107 EKG  showed atrial flutter/fibrillation with RVR of 114 with minimal voltage criteria for LVH and prolonged QT interval with QTc of 554 MS Imaging: Two-view chest x-ray showed mild central vascular congestion.  Patient was started on IV diuresis and cardiology was consulted.  7/23: Overnight worsening complaint of shortness of breath, saturating well on 2 L, ABG with pO2 of 76%, renal function stable,Leukocytosis resolved. Significantly distended belly,concern of ascites, CT renal stone study done last year with concern of hepatic cirrhosis.  Liver ultrasound ordered.  7/24: Mildly elevated blood pressure.  RUQ ultrasound without evidence of cholelithiasis, hepatomegaly without focal liver lesions.  No ascites.  UOP of 3125, slight worsening of renal function with creatinine at 1.97 , improving T. bili at 1.8.  Cardiology switched Lasix with 60 mg of torsemide from tomorrow.  Going for DCCV tomorrow morning.  7/25: Hemodynamically stable, slowly improving renal function.  Unable to pass  TEE probe today so DCCV was postponed for outpatient.  Cardiology started him on amiodarone infusion.  7/26: Remains stable.  Amiodarone will be switched to p.o. from tomorrow per cardiology.  They are recommending cardioversion in 2 to 3 weeks as outpatient, patient would like to follow-up at [facility].   Assessment and Plan: * Acute on chronic systolic CHF (congestive heart failure) (HCC) Prior echocardiogram with EF of 30 to 35% done in July 2022. Patient apparently ran out of Lasix for the past 6 few days. Cardiology was consulted.  Diuresed well with slight improvement in creatinine today -IV Lasix was switched switched with torsemide, holding for 1 to 2 days due worsening creatinine -Losartan dose was increased to 50 mg daily by cardiology -Daily weight and BMP -Strict intake and output    Acute kidney injury superimposed on chronic kidney disease (HCC) Slight worsening of creatinine to 2.04 today.  Appears to have baseline CKD stage IIIa with creatinine around 1.5-1.7 -Hold torsemide tomorrow -Monitor renal function while patient is being diuresed -Avoid nephrotoxins  Chronic atrial fibrillation with RVR (HCC) Persistent atrial flutter with RVR.  Unable to pass TEE probe so DCCV got canceled Cardiology started him on amiodarone infusion. They might plan for DCCV as outpatient if needed - We will continue Eliquis and Coreg. -Amiodarone will be converted to p.o. with a taper from tomorrow -[facility]   Elevated LFTs CT renal stone study done last year with concern of liver cirrhosis. -Liver ultrasound with fatty liver and no ascites. -Continue with diuresis  Hypertensive urgency Blood pressure within goal. -Continue Coreg -Low-dose losartan was added by cardiology -Patient is being diuresed with IV Lasix   Subjective: Patient was sitting comfortably in chair when seen today.  No new concern.  Physical Exam: Vitals:   03/04/23 2318  03/05/23 0401 03/05/23 0735 03/05/23 1122  BP: (!) 141/103 127/80 (!) 158/99 120/79  Pulse: 89 84 (!) 53 64  Resp: 18 20 20 20   Temp: 98 F (36.7 C) (!) 97.5 F (36.4 C) (!) 97.3 F (36.3 C) 97.7 F (36.5 C)  TempSrc:      SpO2: 94% 95% 100% 99%  Weight:  102.5 kg    Height:       General.  Well-developed gentleman, in in no acute distress. Pulmonary.  Lungs clear bilaterally, normal respiratory effort. CV.  Irregularly irregular Abdomen.  Soft, nontender, nondistended, BS positive. CNS.  Alert and oriented .  No focal neurologic deficit. Extremities.  No edema, no cyanosis, pulses intact and symmetrical. Psychiatry.  Judgment and insight appears normal.   Data Reviewed: Prior data reviewed  Family Communication: Discussed with patient  Disposition: Status is: Inpatient Remains inpatient appropriate because: Severity of illness  Planned Discharge Destination: Home  DVT prophylaxis.  Eliquis Time spent: 43 minutes  This record has been created using Conservation officer, historic buildings. Errors have been sought and corrected,but may not always be located. Such creation errors do not reflect on the standard of care.   Author: Arnetha Courser, MD 03/05/2023 2:56 PM  For on call review [email or web address].

## 2023-03-06 DIAGNOSIS — I5023 Acute on chronic systolic (congestive) heart failure: Secondary | ICD-10-CM | POA: Diagnosis not present

## 2023-03-06 DIAGNOSIS — I482 Chronic atrial fibrillation, unspecified: Secondary | ICD-10-CM | POA: Diagnosis not present

## 2023-03-06 DIAGNOSIS — I4892 Unspecified atrial flutter: Secondary | ICD-10-CM

## 2023-03-06 DIAGNOSIS — N179 Acute kidney failure, unspecified: Secondary | ICD-10-CM | POA: Diagnosis not present

## 2023-03-06 LAB — BASIC METABOLIC PANEL
Anion gap: 14 (ref 5–15)
BUN: 32 mg/dL — ABNORMAL HIGH (ref 8–23)
CO2: 27 mmol/L (ref 22–32)
Calcium: 8.5 mg/dL — ABNORMAL LOW (ref 8.9–10.3)
Chloride: 97 mmol/L — ABNORMAL LOW (ref 98–111)
Creatinine, Ser: 2.19 mg/dL — ABNORMAL HIGH (ref 0.61–1.24)
GFR, Estimated: 33 mL/min — ABNORMAL LOW (ref >60)
Glucose, Bld: 173 mg/dL — ABNORMAL HIGH (ref 70–99)
Potassium: 4 mmol/L (ref 3.5–5.1)
Sodium: 138 mmol/L (ref 135–145)

## 2023-03-06 MED ORDER — AMIODARONE HCL 200 MG PO TABS: 200.0000 mg | ORAL_TABLET | Freq: Two times a day (BID) | ORAL | 0 refills | Status: AC

## 2023-03-06 MED ORDER — CARVEDILOL 25 MG PO TABS: 12.5000 mg | ORAL_TABLET | Freq: Two times a day (BID) | ORAL | 1 refills | Status: AC

## 2023-03-06 MED ORDER — LOSARTAN POTASSIUM 50 MG PO TABS: 50.0000 mg | ORAL_TABLET | Freq: Every day | ORAL | 0 refills | Status: AC

## 2023-03-06 MED ORDER — AMIODARONE HCL 200 MG PO TABS
200.0000 mg | ORAL_TABLET | Freq: Two times a day (BID) | ORAL | Status: DC
  Administered 2023-03-06: 200 mg via ORAL
  Filled 2023-03-06: qty 1

## 2023-03-06 NOTE — Discharge Summary (Signed)
Physician Discharge Summary   Patient: Gregory Howell MRN: [id number] DOB: 1961-02-22  Admit date:     03/01/2023  Discharge date: 03/06/23  Discharge Physician: Arnetha Courser   PCP: [facility]   Recommendations at discharge:  Please obtained CBC and BMP by early next week, renal functions are still above baseline.  Patient was advised to hold Lasix and losartan for a day and then resume.  Please make adjustment as appropriate. Patient is being started on amiodarone, will need cardioversion after full anticoagulation. Follow-up with cardiology Follow-up with primary care provider  Discharge Diagnoses: Principal Problem:   Acute on chronic systolic CHF (congestive heart failure) (HCC) Active Problems:   Acute kidney injury superimposed on chronic kidney disease (HCC)   Chronic atrial fibrillation with RVR (HCC)   Hypertensive urgency   Elevated LFTs   Atrial flutter with rapid ventricular response [facility])   Hospital Course: Taken from H&P.  Gregory Howell is a 62 y.o. male with medical history significant for cystoscopy CHF, stage II CKD, CAD, hypertension and nonischemic cardiomyopathy  as well as chronic fibrillation presented to the emergency room with worsening dyspnea over the last couple weeks with associated orthopnea that was up to 3-4 pillow orthopnea, PND and worsening lower extremity edema.   ED Course:  BP was 176/125 and later 159/125 with a pulse of 113 respiratory to 22 ,99% on 2 L of O2 by nasal cannula.  CMP revealed a BUN of creatinine 1.78 above previous levels and calcium 8.6, AST 46 ALT 45 direct bili of 0.9 and total bili of 2.7 with indirect bili 1.8.  High sensitive troponin I was 36 and later 49.  CBC showed leukocytosis of 11.8.  BNP 1107 EKG  showed atrial flutter/fibrillation with RVR of 114 with minimal voltage criteria for LVH and prolonged QT interval with QTc of 554 MS Imaging: Two-view chest x-ray showed mild central vascular  congestion.  Patient was started on IV diuresis and cardiology was consulted.  7/23: Overnight worsening complaint of shortness of breath, saturating well on 2 L, ABG with pO2 of 76%, renal function stable,Leukocytosis resolved. Significantly distended belly,concern of ascites, CT renal stone study done last year with concern of hepatic cirrhosis.  Liver ultrasound ordered.  7/24: Mildly elevated blood pressure.  RUQ ultrasound without evidence of cholelithiasis, hepatomegaly without focal liver lesions.  No ascites.  UOP of 3125, slight worsening of renal function with creatinine at 1.97 , improving T. bili at 1.8.  Cardiology switched Lasix with 60 mg of torsemide from tomorrow.  Going for DCCV tomorrow morning.  7/25: Hemodynamically stable, slowly improving renal function.  Unable to pass TEE probe today so DCCV was postponed for outpatient.  Cardiology started him on amiodarone infusion.  7/26: Remains stable.  Amiodarone will be switched to p.o. from tomorrow per cardiology.  They are recommending cardioversion in 2 to 3 weeks as outpatient, patient would like to follow-up at [facility].  7/27: Hemodynamically stable.  Renal function still above baseline.  Amiodarone infusion was discontinued and patient is being started on p.o. amiodarone which she will take 200 mg twice daily followed by once daily.  His home carvedilol dose was decreased to 12.5 mg by cardiology. He was also started on losartan.  He was advised to hold Lasix and losartan for a day and then started due to renal function.  Patient is being discharged as he wants to go home and follow-up with his on [place] cardiologist.  Patient will need a  repeat renal function testing within next few days so their physician can make changes to his medications as appropriate.  He will continue on current medications and need to have a close follow-up with his providers for further recommendations.  Assessment and Plan: * Acute on chronic systolic CHF  (congestive heart failure) (HCC) Prior echocardiogram with EF of 30 to 35% done in July 2022. Patient apparently ran out of Lasix for the past 6 few days. Cardiology was consulted.  Diuresed well with slight improvement in creatinine today -IV Lasix was switched switched with torsemide, holding for 1 to 2 days due worsening creatinine -Losartan dose was increased to 50 mg daily by cardiology -Daily weight and BMP -Strict intake and output    Acute kidney injury superimposed on chronic kidney disease (HCC) Slight worsening of creatinine to 2.04 today.  Appears to have baseline CKD stage IIIa with creatinine around 1.5-1.7 -Hold torsemide tomorrow -Monitor renal function while patient is being diuresed -Avoid nephrotoxins  Chronic atrial fibrillation with RVR (HCC) Persistent atrial flutter with RVR.  Unable to pass TEE probe so DCCV got canceled Cardiology started him on amiodarone infusion. They might plan for DCCV as outpatient if needed - We will continue Eliquis and Coreg. -Amiodarone will be converted to p.o. with a taper from tomorrow -[facility]   Elevated LFTs CT renal stone study done last year with concern of liver cirrhosis. -Liver ultrasound with fatty liver and no ascites. -Continue with diuresis  Hypertensive urgency Blood pressure within goal. -Continue Coreg -Low-dose losartan was added by cardiology -Patient is being diuresed with IV Lasix   Consultants: Cardiology Procedures performed: Attempted DCCV Disposition: Home Diet recommendation:  Discharge Diet Orders (From admission, onward)     Start     Ordered   03/06/23 0000  Diet - low sodium heart healthy        03/06/23 1103           Cardiac and Carb modified diet DISCHARGE MEDICATION: Allergies as of 03/06/2023       Reactions   Lisinopril    Other reaction(s): ANGIOEDEMA OF LIPS        Medication List     TAKE these medications    amiodarone  200 MG tablet Commonly known as: PACERONE Take 1 tablet (200 mg total) by mouth 2 (two) times daily. For 7 days then just take once daily   apixaban 5 MG Tabs tablet Commonly known as: ELIQUIS Take 1 tablet (5 mg total) by mouth 2 (two) times daily.   carvedilol 25 MG tablet Commonly known as: COREG Take 0.5 tablets (12.5 mg total) by mouth 2 (two) times daily. What changed: how much to take   empagliflozin 10 MG Tabs tablet Commonly known as: JARDIANCE Take 10 mg by mouth daily.   furosemide 40 MG tablet Commonly known as: LASIX Take 40 mg by mouth 2 (two) times daily.   losartan 50 MG tablet Commonly known as: COZAAR Take 1 tablet (50 mg total) by mouth daily. Start taking on: March 07, 2023   rosuvastatin 40 MG tablet Commonly known as: CRESTOR Take 20 mg by mouth every evening.        Follow-up Information     [facility]. Schedule an appointment as soon as possible for a visit in 1 week(s).   Specialty: General Practice Contact information: [address] [place] [address] [phone]  Discharge Exam: Filed Weights   03/04/23 1153 03/05/23 0401 03/06/23 0506  Weight: 111.1 kg 102.5 kg 101.8 kg   General.  Well-developed gentleman, in no acute distress. Pulmonary.  Lungs clear bilaterally, normal respiratory effort. CV.  Irregularly irregular Abdomen.  Soft, nontender, nondistended, BS positive. CNS.  Alert and oriented .  No focal neurologic deficit. Extremities.  No edema, no cyanosis, pulses intact and symmetrical. Psychiatry.  Judgment and insight appears normal.   Condition at discharge: stable  The results of significant diagnostics from this hospitalization (including imaging, microbiology, ancillary and laboratory) are listed below for reference.   Imaging Studies: ECHOCARDIOGRAM LIMITED  Result Date: 03/04/2023    ECHOCARDIOGRAM LIMITED REPORT   Patient Name:   Gregory Howell Date of Exam: 03/04/2023 Medical  Rec #:  [id number]      Height:       69.0 in Accession #:    [id number]     Weight:       245.0 lb Date of Birth:  [birth year]     BSA:          2.252 m Patient Age:    61 years       BP:           149/119 mmHg Patient Gender: M              HR:           62 bpm. Exam Location:  [facility] Procedure: Limited Echo, Color Doppler, Cardiac Doppler and Strain Analysis Indications:     Cardiomyopathy-unspecified I42.9  History:         Patient has prior history of Echocardiogram examinations, most                  recent 02/28/2021. CHF; Risk Factors:Hypertension. NICM.  Sonographer:     Cristela Blue Referring Phys:  [id number] Dorthula Nettles Diagnosing Phys: Julien Nordmann MD  Sonographer Comments: Global longitudinal strain was attempted. IMPRESSIONS  1. Left ventricular ejection fraction, by estimation, is <20%. Left ventricular ejection fraction by PLAX is 15 %. The left ventricle has severely decreased function. The left ventricle demonstrates global hypokinesis. The left ventricular internal cavity size was moderately dilated. The average left ventricular global longitudinal strain is -3.0 %. The global longitudinal strain is abnormal.  2. Right ventricular systolic function is moderately reduced. The right ventricular size is normal. There is mildly elevated pulmonary artery systolic pressure. The estimated right ventricular systolic pressure is 42.2 mmHg.  3. Left atrial size was mildly dilated.  4. Right atrial size was moderately dilated.  5. The mitral valve is normal in structure. Mild to moderate mitral valve regurgitation. No evidence of mitral stenosis.  6. The aortic valve has an indeterminant number of cusps. Aortic valve regurgitation is not visualized. No aortic stenosis is present.  7. The inferior vena cava is normal in size with greater than 50% respiratory variability, suggesting right atrial pressure of 3 mmHg. FINDINGS  Left Ventricle: Left ventricular ejection fraction, by estimation, is <20%. Left  ventricular ejection fraction by PLAX is 15 %. The left ventricle has severely decreased function. The left ventricle demonstrates global hypokinesis. The average left ventricular global longitudinal strain is -3.0 %. The global longitudinal strain is abnormal. The left ventricular internal cavity size was moderately dilated. There is no left ventricular hypertrophy. Right Ventricle: The right ventricular size is normal. No increase in right ventricular wall thickness. Right ventricular systolic function is moderately reduced. There is mildly elevated pulmonary  artery systolic pressure. The tricuspid regurgitant velocity is 3.05 m/s, and with an assumed right atrial pressure of 5 mmHg, the estimated right ventricular systolic pressure is 42.2 mmHg. Left Atrium: Left atrial size was mildly dilated. Right Atrium: Right atrial size was moderately dilated. Pericardium: There is no evidence of pericardial effusion. Mitral Valve: The mitral valve is normal in structure. There is mild calcification of the mitral valve leaflet(s). Mild to moderate mitral valve regurgitation. No evidence of mitral valve stenosis. Tricuspid Valve: The tricuspid valve is normal in structure. Tricuspid valve regurgitation is mild . No evidence of tricuspid stenosis. Aortic Valve: The aortic valve has an indeterminant number of cusps. Aortic valve regurgitation is not visualized. No aortic stenosis is present. Pulmonic Valve: The pulmonic valve was normal in structure. Pulmonic valve regurgitation is not visualized. No evidence of pulmonic stenosis. Aorta: The aortic root is normal in size and structure. Venous: The inferior vena cava is normal in size with greater than 50% respiratory variability, suggesting right atrial pressure of 3 mmHg. IAS/Shunts: No atrial level shunt detected by color flow Doppler. Additional Comments: Spectral Doppler performed. Color Doppler performed.  LEFT VENTRICLE PLAX 2D LV EF:         Left                 ventricular     2D                ejection        Longitudinal                fraction by     Strain                PLAX is 15      2D Strain GLS  -3.0 %                %.              Avg: LVIDd:         6.00 cm LVIDs:         5.60 cm LV PW:         1.00 cm LV IVS:        1.30 cm LVOT diam:     2.20 cm LVOT Area:     3.80 cm  LV Volumes (MOD) LV vol d, MOD    156.0 ml A2C: LV vol d, MOD    157.0 ml A4C: LV vol s, MOD    144.0 ml A2C: LV vol s, MOD    137.0 ml A4C: LV SV MOD A2C:   12.0 ml LV SV MOD A4C:   157.0 ml LV SV MOD BP:    20.9 ml LEFT ATRIUM             Index        RIGHT ATRIUM           Index LA diam:        5.30 cm 2.35 cm/m   RA Area:     29.10 cm LA Vol (A2C):   71.0 ml 31.53 ml/m  RA Volume:   113.00 ml 50.18 ml/m LA Vol (A4C):   67.7 ml 30.06 ml/m LA Biplane Vol: 70.9 ml 31.48 ml/m   AORTA Ao Root diam: 3.40 cm TRICUSPID VALVE TR Peak grad:   37.2 mmHg TR Vmax:        305.00 cm/s  SHUNTS Systemic Diam: 2.20 cm Julien Nordmann MD Electronically signed by  Julien Nordmann MD Signature Date/Time: 03/04/2023/8:08:28 PM    Final    US Abdomen Limited RUQ (LIVER/GB)  Result Date: 03/02/2023 CLINICAL DATA:  Elevated liver function tests. EXAM: ULTRASOUND ABDOMEN LIMITED RIGHT UPPER QUADRANT COMPARISON:  April 04, 2020 FINDINGS: Gallbladder: The gallbladder is markedly contracted and subsequently limited in evaluation. No gallstones are visualized. The gallbladder wall measures 5.6 mm in thickness. No sonographic Murphy sign noted by sonographer. Common bile duct: Diameter: 2.4 mm Liver: The liver is enlarged and measures 20.67 cm in length. No focal lesion identified. Within normal limits in parenchymal echogenicity. Portal vein is patent on color Doppler imaging with normal direction of blood flow towards the liver. Other: No ascites is visualized within the left upper quadrant, left lower quadrant, right lower quadrant or right upper quadrant. IMPRESSION: 1. Limited evaluation of the gallbladder,  without evidence of cholelithiasis. 2. Hepatomegaly without focal liver lesions. 3. No ascites. Electronically Signed   By: Aram Candela M.D.   On: 03/02/2023 15:49   DG Chest Port 1 View  Result Date: 03/02/2023 CLINICAL DATA:  Shortness of breath, chest pain EXAM: PORTABLE CHEST 1 VIEW COMPARISON:  03/01/2023 FINDINGS: Mild cardiomegaly. No confluent airspace opacities, effusions or edema. No acute bony abnormality. IMPRESSION: Cardiomegaly.  No active disease. Electronically Signed   By: Charlett Nose M.D.   On: 03/02/2023 02:49   DG Chest 2 View  Result Date: 03/01/2023 CLINICAL DATA:  Chest pain EXAM: CHEST - 2 VIEW COMPARISON:  08/17/2022 FINDINGS: Cardiac shadow is mildly enlarged but stable. Lungs are well aerated bilaterally. No focal infiltrate or effusion is seen. Mild central vascular congestion is again noted. No significant edema is seen. No bony abnormality is noted. IMPRESSION: Mild central vascular congestion without acute parenchymal abnormality. Electronically Signed   By: Alcide Clever M.D.   On: 03/01/2023 17:12    Microbiology: Results for orders placed or performed during the hospital encounter of 08/17/22  Resp panel by RT-PCR (RSV, Flu A&B, Covid) Anterior Nasal Swab     Status: None   Collection Time: 08/17/22  4:04 PM   Specimen: Anterior Nasal Swab  Result Value Ref Range Status   SARS Coronavirus 2 by RT PCR NEGATIVE NEGATIVE Final    Comment: (NOTE) SARS-CoV-2 target nucleic acids are NOT DETECTED.  The SARS-CoV-2 RNA is generally detectable in upper respiratory specimens during the acute phase of infection. The lowest concentration of SARS-CoV-2 viral copies this assay can detect is 138 copies/mL. A negative result does not preclude SARS-Cov-2 infection and should not be used as the sole basis for treatment or other patient management decisions. A negative result may occur with  improper specimen collection/handling, submission of specimen other than  nasopharyngeal swab, presence of viral mutation(s) within the areas targeted by this assay, and inadequate number of viral copies(<138 copies/mL). A negative result must be combined with clinical observations, patient history, and epidemiological information. The expected result is Negative.  Fact Sheet for Patients:  BloggerCourse.com  Fact Sheet for Healthcare Providers:  SeriousBroker.it  This test is no t yet approved or cleared by the [place] FDA and  has been authorized for detection and/or diagnosis of SARS-CoV-2 by FDA under an Emergency Use Authorization (EUA). This EUA will remain  in effect (meaning this test can be used) for the duration of the COVID-19 declaration under Section 564(b)(1) of the Act, 21 U.S.C.section 360bbb-3(b)(1), unless the authorization is terminated  or revoked sooner.       Influenza A by PCR  NEGATIVE NEGATIVE Final   Influenza B by PCR NEGATIVE NEGATIVE Final    Comment: (NOTE) The Xpert Xpress SARS-CoV-2/FLU/RSV plus assay is intended as an aid in the diagnosis of influenza from Nasopharyngeal swab specimens and should not be used as a sole basis for treatment. Nasal washings and aspirates are unacceptable for Xpert Xpress SARS-CoV-2/FLU/RSV testing.  Fact Sheet for Patients: BloggerCourse.com  Fact Sheet for Healthcare Providers: SeriousBroker.it  This test is not yet approved or cleared by the [place] FDA and has been authorized for detection and/or diagnosis of SARS-CoV-2 by FDA under an Emergency Use Authorization (EUA). This EUA will remain in effect (meaning this test can be used) for the duration of the COVID-19 declaration under Section 564(b)(1) of the Act, 21 U.S.C. section 360bbb-3(b)(1), unless the authorization is terminated or revoked.     Resp Syncytial Virus by PCR NEGATIVE NEGATIVE Final    Comment:  (NOTE) Fact Sheet for Patients: BloggerCourse.com  Fact Sheet for Healthcare Providers: SeriousBroker.it  This test is not yet approved or cleared by the [place] FDA and has been authorized for detection and/or diagnosis of SARS-CoV-2 by FDA under an Emergency Use Authorization (EUA). This EUA will remain in effect (meaning this test can be used) for the duration of the COVID-19 declaration under Section 564(b)(1) of the Act, 21 U.S.C. section 360bbb-3(b)(1), unless the authorization is terminated or revoked.  Performed at [facility], [address] [place], [place] [address]     Labs: CBC: Recent Labs  Lab 03/01/23 1627 03/02/23 0618  WBC 11.8* 9.1  HGB 16.8 15.8  HCT 46.3 44.3  MCV 87.2 88.6  PLT 214 193   Basic Metabolic Panel: Recent Labs  Lab 03/02/23 0618 03/03/23 0554 03/04/23 0416 03/05/23 0952 03/06/23 0948  NA 137 134* 136 137 138  K 4.4 4.1 3.9 3.5 4.0  CL 106 106 99 100 97*  CO2 23 24 27 25 27   GLUCOSE 107* 111* 94 126* 173*  BUN 28* 32* 31* 32* 32*  CREATININE 1.78* 1.97* 1.82* 2.04* 2.19*  CALCIUM 8.3* 8.1* 8.3* 8.5* 8.5*  MG  --  2.3  --   --   --    Liver Function Tests: Recent Labs  Lab 03/01/23 1627 03/02/23 0618 03/03/23 0554  AST 46* 44* 40  ALT 45* 43 44  ALKPHOS 120 110 108  BILITOT 2.7* 2.2* 1.8*  PROT 7.4 7.2 7.3  ALBUMIN 3.9 3.6 3.7   CBG: No results for input(s): "GLUCAP" in the last 168 hours.  Discharge time spent: greater than 30 minutes.  This record has been created using Conservation officer, historic buildings. Errors have been sought and corrected,but may not always be located. Such creation errors do not reflect on the standard of care.   Signed: Arnetha Courser, MD [facility] 03/06/2023

## 2023-03-06 NOTE — TOC Transition Note (Signed)
Transition of Care [facility]) - CM/SW Discharge Note   Patient Details  Name: Gregory Howell MRN: [id number] Date of Birth: 1960/11/18  Transition of Care [facility]) CM/SW Contact:  Bing Quarry, RN Phone Number: 03/06/2023, 12:31 PM   Clinical Narrative: 7/27: DC to home self care per orders. Provider notes indicate recommendations cardioversion in 2 to 3 weeks as outpatient, and patient would like to follow-up at [facility], [place].   Provider DC To Do List To Do List        Schedule an appointment with [facility] as soon as possible for a visit in 1 week Specialty: General Practice [address] [place] [address] [phone]  AUG  6 ESTABLISHED CHF with Delma Freeze Tuesday Mar 16, 2023 8:30 AM [facility] [address] [place] [address] [phone]     Gabriel Cirri MSN RN CM  Transitions of Care Department [facility] [phone] Weekends Only    Final next level of care: Home/Self Care Barriers to Discharge: No Barriers Identified   Patient Goals and CMS Choice      Discharge Placement                         Discharge Plan and Services Additional resources added to the After Visit Summary for                  DME Arranged: N/A DME Agency: NA       HH Arranged: NA HH Agency: NA        Social Determinants of Health (SDOH) Interventions SDOH Screenings   Food Insecurity: No Food Insecurity (03/03/2023)  Housing: Low Risk  (03/03/2023)  Transportation Needs: No Transportation Needs (03/03/2023)  Utilities: Not At Risk (03/03/2023)  Tobacco Use: High Risk (03/01/2023)     Readmission Risk Interventions    09/11/2021   11:50 AM  Readmission Risk Prevention Plan  Transportation Screening Complete  PCP or Specialist Appt within 3-5 Days Complete  Social Work Consult for Recovery Care Planning/Counseling Complete  Palliative Care Screening Not Applicable  Medication Review Special educational needs teacher) Complete

## 2023-03-15 ENCOUNTER — Emergency Department: Payer: No Typology Code available for payment source

## 2023-03-15 ENCOUNTER — Other Ambulatory Visit: Payer: Self-pay

## 2023-03-15 DIAGNOSIS — N1832 Chronic kidney disease, stage 3b: Secondary | ICD-10-CM | POA: Diagnosis present

## 2023-03-15 DIAGNOSIS — Z79899 Other long term (current) drug therapy: Secondary | ICD-10-CM

## 2023-03-15 DIAGNOSIS — R17 Unspecified jaundice: Secondary | ICD-10-CM | POA: Diagnosis present

## 2023-03-15 DIAGNOSIS — Z7984 Long term (current) use of oral hypoglycemic drugs: Secondary | ICD-10-CM

## 2023-03-15 DIAGNOSIS — Z91119 Patient's noncompliance with dietary regimen due to unspecified reason: Secondary | ICD-10-CM

## 2023-03-15 DIAGNOSIS — I5023 Acute on chronic systolic (congestive) heart failure: Secondary | ICD-10-CM | POA: Diagnosis present

## 2023-03-15 DIAGNOSIS — I160 Hypertensive urgency: Secondary | ICD-10-CM | POA: Diagnosis present

## 2023-03-15 DIAGNOSIS — Z7901 Long term (current) use of anticoagulants: Secondary | ICD-10-CM

## 2023-03-15 DIAGNOSIS — Z8249 Family history of ischemic heart disease and other diseases of the circulatory system: Secondary | ICD-10-CM

## 2023-03-15 DIAGNOSIS — I428 Other cardiomyopathies: Secondary | ICD-10-CM | POA: Diagnosis present

## 2023-03-15 DIAGNOSIS — I4819 Other persistent atrial fibrillation: Secondary | ICD-10-CM | POA: Diagnosis present

## 2023-03-15 DIAGNOSIS — Z888 Allergy status to other drugs, medicaments and biological substances status: Secondary | ICD-10-CM

## 2023-03-15 DIAGNOSIS — E669 Obesity, unspecified: Secondary | ICD-10-CM | POA: Diagnosis present

## 2023-03-15 DIAGNOSIS — I2510 Atherosclerotic heart disease of native coronary artery without angina pectoris: Secondary | ICD-10-CM | POA: Diagnosis present

## 2023-03-15 DIAGNOSIS — Z91148 Patient's other noncompliance with medication regimen for other reason: Secondary | ICD-10-CM

## 2023-03-15 DIAGNOSIS — E785 Hyperlipidemia, unspecified: Secondary | ICD-10-CM | POA: Diagnosis present

## 2023-03-15 DIAGNOSIS — I130 Hypertensive heart and chronic kidney disease with heart failure and stage 1 through stage 4 chronic kidney disease, or unspecified chronic kidney disease: Secondary | ICD-10-CM | POA: Diagnosis not present

## 2023-03-15 DIAGNOSIS — I509 Heart failure, unspecified: Secondary | ICD-10-CM | POA: Diagnosis not present

## 2023-03-15 DIAGNOSIS — Z6832 Body mass index (BMI) 32.0-32.9, adult: Secondary | ICD-10-CM

## 2023-03-15 DIAGNOSIS — I4892 Unspecified atrial flutter: Secondary | ICD-10-CM | POA: Diagnosis present

## 2023-03-15 DIAGNOSIS — Z91199 Patient's noncompliance with other medical treatment and regimen due to unspecified reason: Secondary | ICD-10-CM

## 2023-03-15 DIAGNOSIS — R1310 Dysphagia, unspecified: Secondary | ICD-10-CM | POA: Diagnosis present

## 2023-03-15 DIAGNOSIS — F17210 Nicotine dependence, cigarettes, uncomplicated: Secondary | ICD-10-CM | POA: Diagnosis present

## 2023-03-15 DIAGNOSIS — R490 Dysphonia: Secondary | ICD-10-CM | POA: Diagnosis present

## 2023-03-15 DIAGNOSIS — Z833 Family history of diabetes mellitus: Secondary | ICD-10-CM

## 2023-03-15 LAB — COMPREHENSIVE METABOLIC PANEL
ALT: 29 U/L (ref 0–44)
AST: 36 U/L (ref 15–41)
Albumin: 3.7 g/dL (ref 3.5–5.0)
Alkaline Phosphatase: 151 U/L — ABNORMAL HIGH (ref 38–126)
Anion gap: 10 (ref 5–15)
BUN: 21 mg/dL (ref 8–23)
CO2: 22 mmol/L (ref 22–32)
Calcium: 8.5 mg/dL — ABNORMAL LOW (ref 8.9–10.3)
Chloride: 105 mmol/L (ref 98–111)
Creatinine, Ser: 1.74 mg/dL — ABNORMAL HIGH (ref 0.61–1.24)
GFR, Estimated: 44 mL/min — ABNORMAL LOW (ref >60)
Glucose, Bld: 102 mg/dL — ABNORMAL HIGH (ref 70–99)
Potassium: 4.8 mmol/L (ref 3.5–5.1)
Sodium: 137 mmol/L (ref 135–145)
Total Bilirubin: 3 mg/dL — ABNORMAL HIGH (ref 0.3–1.2)
Total Protein: 7.6 g/dL (ref 6.5–8.1)

## 2023-03-15 LAB — CBC
HCT: 45.7 % (ref 39.0–52.0)
Hemoglobin: 16 g/dL (ref 13.0–17.0)
MCH: 31.2 pg (ref 26.0–34.0)
MCHC: 35 g/dL (ref 30.0–36.0)
MCV: 89.1 fL (ref 80.0–100.0)
Platelets: 229 10*3/uL (ref 150–400)
RBC: 5.13 MIL/uL (ref 4.22–5.81)
RDW: 16 % — ABNORMAL HIGH (ref 11.5–15.5)
WBC: 11.3 10*3/uL — ABNORMAL HIGH (ref 4.0–10.5)
nRBC: 0.2 % (ref 0.0–0.2)

## 2023-03-15 LAB — MAGNESIUM: Magnesium: 2.1 mg/dL (ref 1.7–2.4)

## 2023-03-15 LAB — TROPONIN I (HIGH SENSITIVITY): Troponin I (High Sensitivity): 57 ng/L — ABNORMAL HIGH (ref <18)

## 2023-03-15 LAB — BRAIN NATRIURETIC PEPTIDE: B Natriuretic Peptide: 1134.5 pg/mL — ABNORMAL HIGH (ref 0.0–100.0)

## 2023-03-15 NOTE — ED Triage Notes (Signed)
Pt presents to ER with c/o sob that has been getting progressively worse over last 36 hours.  Pt admitted to hospital recently and states he has been unable to get his lasix d/t it being sent to wrong address.  Pt states he has hx of CHF, and takes 40mg  lasix BID. Pt reports swelling in his lower extremities and abdomen.  States his O2 sats have been good at home, but he has had a hard time getting a good breath.  Pt has significant abd swelling noted to triage, and is tachypneic.  Pt able to speak in full sentences, though still appears to be struggling at times.  Pt is otherwise A&O x4 in triage.

## 2023-03-16 ENCOUNTER — Encounter: Payer: No Typology Code available for payment source | Admitting: Family

## 2023-03-16 ENCOUNTER — Inpatient Hospital Stay
Admission: EM | Admit: 2023-03-16 | Discharge: 2023-03-20 | DRG: 291 | Disposition: A | Discharge status: Home / Self Care | Payer: No Typology Code available for payment source | Attending: Internal Medicine | Admitting: Internal Medicine

## 2023-03-16 DIAGNOSIS — I4819 Other persistent atrial fibrillation: Secondary | ICD-10-CM | POA: Diagnosis present

## 2023-03-16 DIAGNOSIS — I5023 Acute on chronic systolic (congestive) heart failure: Secondary | ICD-10-CM | POA: Diagnosis present

## 2023-03-16 DIAGNOSIS — Z6832 Body mass index (BMI) 32.0-32.9, adult: Secondary | ICD-10-CM | POA: Diagnosis not present

## 2023-03-16 DIAGNOSIS — I428 Other cardiomyopathies: Secondary | ICD-10-CM | POA: Diagnosis present

## 2023-03-16 DIAGNOSIS — N1832 Chronic kidney disease, stage 3b: Secondary | ICD-10-CM | POA: Diagnosis present

## 2023-03-16 DIAGNOSIS — I5043 Acute on chronic combined systolic (congestive) and diastolic (congestive) heart failure: Principal | ICD-10-CM

## 2023-03-16 DIAGNOSIS — Z7984 Long term (current) use of oral hypoglycemic drugs: Secondary | ICD-10-CM | POA: Diagnosis not present

## 2023-03-16 DIAGNOSIS — Z91199 Patient's noncompliance with other medical treatment and regimen due to unspecified reason: Secondary | ICD-10-CM | POA: Diagnosis not present

## 2023-03-16 DIAGNOSIS — I509 Heart failure, unspecified: Secondary | ICD-10-CM | POA: Diagnosis present

## 2023-03-16 DIAGNOSIS — R1310 Dysphagia, unspecified: Secondary | ICD-10-CM | POA: Diagnosis present

## 2023-03-16 DIAGNOSIS — Z7901 Long term (current) use of anticoagulants: Secondary | ICD-10-CM | POA: Diagnosis not present

## 2023-03-16 DIAGNOSIS — Z833 Family history of diabetes mellitus: Secondary | ICD-10-CM | POA: Diagnosis not present

## 2023-03-16 DIAGNOSIS — I10 Essential (primary) hypertension: Secondary | ICD-10-CM | POA: Diagnosis not present

## 2023-03-16 DIAGNOSIS — R490 Dysphonia: Secondary | ICD-10-CM | POA: Diagnosis present

## 2023-03-16 DIAGNOSIS — Z8249 Family history of ischemic heart disease and other diseases of the circulatory system: Secondary | ICD-10-CM | POA: Diagnosis not present

## 2023-03-16 DIAGNOSIS — I130 Hypertensive heart and chronic kidney disease with heart failure and stage 1 through stage 4 chronic kidney disease, or unspecified chronic kidney disease: Secondary | ICD-10-CM | POA: Diagnosis present

## 2023-03-16 DIAGNOSIS — E785 Hyperlipidemia, unspecified: Secondary | ICD-10-CM | POA: Diagnosis present

## 2023-03-16 DIAGNOSIS — E669 Obesity, unspecified: Secondary | ICD-10-CM

## 2023-03-16 DIAGNOSIS — I2510 Atherosclerotic heart disease of native coronary artery without angina pectoris: Secondary | ICD-10-CM | POA: Diagnosis present

## 2023-03-16 DIAGNOSIS — Z91148 Patient's other noncompliance with medication regimen for other reason: Secondary | ICD-10-CM | POA: Diagnosis not present

## 2023-03-16 DIAGNOSIS — Z91119 Patient's noncompliance with dietary regimen due to unspecified reason: Secondary | ICD-10-CM | POA: Diagnosis not present

## 2023-03-16 DIAGNOSIS — Z888 Allergy status to other drugs, medicaments and biological substances status: Secondary | ICD-10-CM | POA: Diagnosis not present

## 2023-03-16 DIAGNOSIS — I4892 Unspecified atrial flutter: Secondary | ICD-10-CM | POA: Diagnosis present

## 2023-03-16 DIAGNOSIS — R17 Unspecified jaundice: Secondary | ICD-10-CM | POA: Diagnosis present

## 2023-03-16 DIAGNOSIS — F17210 Nicotine dependence, cigarettes, uncomplicated: Secondary | ICD-10-CM | POA: Diagnosis present

## 2023-03-16 DIAGNOSIS — I160 Hypertensive urgency: Secondary | ICD-10-CM | POA: Diagnosis present

## 2023-03-16 DIAGNOSIS — Z79899 Other long term (current) drug therapy: Secondary | ICD-10-CM | POA: Diagnosis not present

## 2023-03-16 DIAGNOSIS — I483 Typical atrial flutter: Secondary | ICD-10-CM | POA: Diagnosis not present

## 2023-03-16 MED ORDER — FUROSEMIDE 10 MG/ML IJ SOLN
40.0000 mg | Freq: Two times a day (BID) | INTRAMUSCULAR | Status: DC
  Administered 2023-03-16 – 2023-03-19 (×7): 40 mg via INTRAVENOUS
  Filled 2023-03-16 (×7): qty 4

## 2023-03-16 MED ORDER — CARVEDILOL 12.5 MG PO TABS
12.5000 mg | ORAL_TABLET | Freq: Two times a day (BID) | ORAL | Status: DC
  Administered 2023-03-16 – 2023-03-20 (×9): 12.5 mg via ORAL
  Filled 2023-03-16 (×4): qty 1
  Filled 2023-03-16 (×3): qty 2
  Filled 2023-03-16 (×2): qty 1

## 2023-03-16 MED ORDER — ACETAMINOPHEN 325 MG PO TABS
650.0000 mg | ORAL_TABLET | Freq: Four times a day (QID) | ORAL | Status: DC | PRN
  Administered 2023-03-18 – 2023-03-19 (×2): 650 mg via ORAL
  Filled 2023-03-16 (×2): qty 2

## 2023-03-16 MED ORDER — ONDANSETRON HCL 4 MG/2ML IJ SOLN: 4.0000 mg | Freq: Four times a day (QID) | INTRAMUSCULAR | Status: DC | PRN

## 2023-03-16 MED ORDER — FUROSEMIDE 10 MG/ML IJ SOLN
60.0000 mg | Freq: Once | INTRAMUSCULAR | Status: AC
  Administered 2023-03-16: 60 mg via INTRAVENOUS
  Filled 2023-03-16: qty 8

## 2023-03-16 MED ORDER — ACETAMINOPHEN 650 MG RE SUPP: 650.0000 mg | Freq: Four times a day (QID) | RECTAL | Status: DC | PRN

## 2023-03-16 MED ORDER — ROSUVASTATIN CALCIUM 10 MG PO TABS
20.0000 mg | ORAL_TABLET | Freq: Every evening | ORAL | Status: DC
  Administered 2023-03-16 – 2023-03-19 (×4): 20 mg via ORAL
  Filled 2023-03-16: qty 2
  Filled 2023-03-16 (×2): qty 1
  Filled 2023-03-16 (×2): qty 2

## 2023-03-16 MED ORDER — LOSARTAN POTASSIUM 50 MG PO TABS
50.0000 mg | ORAL_TABLET | Freq: Every day | ORAL | Status: DC
  Administered 2023-03-16 – 2023-03-18 (×3): 50 mg via ORAL
  Filled 2023-03-16 (×3): qty 1

## 2023-03-16 MED ORDER — EMPAGLIFLOZIN 10 MG PO TABS
10.0000 mg | ORAL_TABLET | Freq: Every day | ORAL | Status: DC
  Administered 2023-03-16 – 2023-03-20 (×5): 10 mg via ORAL
  Filled 2023-03-16 (×5): qty 1

## 2023-03-16 MED ORDER — APIXABAN 5 MG PO TABS
5.0000 mg | ORAL_TABLET | Freq: Two times a day (BID) | ORAL | Status: DC
  Administered 2023-03-16 – 2023-03-20 (×9): 5 mg via ORAL
  Filled 2023-03-16 (×9): qty 1

## 2023-03-16 MED ORDER — ONDANSETRON HCL 4 MG PO TABS: 4.0000 mg | ORAL_TABLET | Freq: Four times a day (QID) | ORAL | Status: DC | PRN

## 2023-03-16 MED ORDER — GABAPENTIN 100 MG PO CAPS
200.0000 mg | ORAL_CAPSULE | Freq: Once | ORAL | Status: AC
  Administered 2023-03-16: 200 mg via ORAL
  Filled 2023-03-16: qty 2

## 2023-03-16 NOTE — Assessment & Plan Note (Signed)
Right upper quadrant ultrasound done last month showed no evidence of cholecystitis, did show hepatomegaly.

## 2023-03-16 NOTE — Assessment & Plan Note (Signed)
BMI 32.49 with current height and weight in computer.

## 2023-03-16 NOTE — ED Provider Notes (Signed)
[facility] Provider Note    Event Date/Time   First MD Initiated Contact with Patient 03/16/23 [phone]     (approximate)   History   Shortness of Breath   HPI  Gregory Howell is a 62 y.o. male who presents to the ED for evaluation of Shortness of Breath   I reviewed medical DC summary from 7/27.  Admitted for a few days due to CHF exacerbation, A-fib with RVR and AKI.  They were going to do a TEE and cardiovert but apparently unable to pass TEE probe so this was canceled he was started on amiodarone.  Anticoagulated on Eliquis.  Patient presents to the ED for evaluation of increased shortness of breath, orthopnea and lower extremity swelling in the setting of not having any of his prescription medications at home.  [facility]  Reports he is on a mailing list, but they have been sending his prescriptions to the wrong address.  When he calls to try to get his prescription sent to local pharmacy for him to pick up, says that he cannot get them because he is on the mailing list.  Physical Exam   Triage Vital Signs: ED Triage Vitals [03/15/23 2149]  Encounter Vitals Group     BP (!) 159/122     Systolic BP Percentile      Diastolic BP Percentile      Pulse Rate 97     Resp (!) 28     Temp 97.6 F (36.4 C)     Temp Source Oral     SpO2 96 %     Weight 220 lb (99.8 kg)     Height 5\' 9"  (1.753 m)     Head Circumference      Peak Flow      Pain Score 0     Pain Loc      Pain Education      Exclude from Growth Chart     Most recent vital signs: Vitals:   03/16/23 0303 03/16/23 0315  BP:    Pulse:    Resp:    Temp:    SpO2: (!) 88% 98%    General: Awake, no distress.  CV:  Good peripheral perfusion.  Resp:  Tachypnea without wheezing Abd:  No distention.  MSK:  No deformity noted.  Lower extremity edema Neuro:  No focal deficits appreciated. Other:     ED Results / Procedures / Treatments    Labs (all labs ordered are listed, but only abnormal results are displayed) Labs Reviewed  CBC - Abnormal; Notable for the following components:      Result Value   WBC 11.3 (*)    RDW 16.0 (*)    All other components within normal limits  COMPREHENSIVE METABOLIC PANEL - Abnormal; Notable for the following components:   Glucose, Bld 102 (*)    Creatinine, Ser 1.74 (*)    Calcium 8.5 (*)    Alkaline Phosphatase 151 (*)    Total Bilirubin 3.0 (*)    GFR, Estimated 44 (*)    All other components within normal limits  BRAIN NATRIURETIC PEPTIDE - Abnormal; Notable for the following components:   B Natriuretic Peptide 1,134.5 (*)    All other components within normal limits  TROPONIN I (HIGH SENSITIVITY) - Abnormal; Notable for the following components:   Troponin I (High Sensitivity) 57 (*)    All other components within normal limits  TROPONIN I (HIGH SENSITIVITY) -  Abnormal; Notable for the following components:   Troponin I (High Sensitivity) 48 (*)    All other components within normal limits  MAGNESIUM    EKG A-fib with a rate of 98 bpm.  Normal axis and intervals.  No STEMI.  Nonspecific ST changes are present  RADIOLOGY 1 view CXR interpreted by me with cardiomegaly and pulmonary vascular congestion  Official radiology report(s): DG Chest 1 View  Result Date: 03/15/2023 CLINICAL DATA:  Shortness of breath EXAM: CHEST  1 VIEW COMPARISON:  Chest x-ray 03/02/2023 FINDINGS: The heart is enlarged, unchanged. There central pulmonary vascular congestion. There is no focal lung consolidation, pleural effusion or pneumothorax. No acute fractures are seen. IMPRESSION: Cardiomegaly with central pulmonary vascular congestion. Electronically Signed   By: Darliss Cheney M.D.   On: 03/15/2023 22:27    PROCEDURES and INTERVENTIONS:  .1-3 Lead EKG Interpretation  Performed by: Delton Prairie, MD Authorized by: Delton Prairie, MD     Interpretation: normal     ECG rate:  88   ECG rate  assessment: normal     Rhythm: atrial fibrillation     Ectopy: none     Conduction: normal     Medications  carvedilol (COREG) tablet 12.5 mg (12.5 mg Oral Given 03/16/23 0307)  losartan (COZAAR) tablet 50 mg (has no administration in time range)  rosuvastatin (CRESTOR) tablet 20 mg (has no administration in time range)  apixaban (ELIQUIS) tablet 5 mg (5 mg Oral Given 03/16/23 0305)  empagliflozin (JARDIANCE) tablet 10 mg (has no administration in time range)  acetaminophen (TYLENOL) tablet 650 mg (has no administration in time range)    Or  acetaminophen (TYLENOL) suppository 650 mg (has no administration in time range)  ondansetron (ZOFRAN) tablet 4 mg (has no administration in time range)    Or  ondansetron (ZOFRAN) injection 4 mg (has no administration in time range)  furosemide (LASIX) injection 40 mg (has no administration in time range)  furosemide (LASIX) injection 60 mg (60 mg Intravenous Given 03/16/23 0132)     IMPRESSION / MDM / ASSESSMENT AND PLAN / ED COURSE  I reviewed the triage vital signs and the nursing notes.  Differential diagnosis includes, but is not limited to, pleural effusion, CHF exacerbation, A-fib with RVR, ACS  {Patient presents with symptoms of an acute illness or injury that is potentially life-threatening.  Patient presents with a CHF exacerbation due to not having his medications at home due to logistic issues.  Clearly volume overloaded and tachypneic and symptomatic, but not hypoxic.  Congested CXR, elevated BNP.  Mildly elevated troponins but I suspect this is secondary to his respiratory status.  Marginal leukocytosis but no signs of infectious etiology of his symptoms.  Chronic renal dysfunction around baseline.  Restart diuresis and consult with medicine for admission  Clinical Course as of 03/16/23 0418  Tue Mar 16, 2023  0149 Consult with medicine who agrees to admit [DS]    Clinical Course User Index [DS] Delton Prairie, MD     FINAL  CLINICAL IMPRESSION(S) / ED DIAGNOSES   Final diagnoses:  Acute on chronic combined systolic and diastolic congestive heart failure (HCC)     Rx / DC Orders   ED Discharge Orders     None        Note:  This document was prepared using Dragon voice recognition software and may include unintentional dictation errors.   Delton Prairie, MD 03/16/23 [phone]

## 2023-03-16 NOTE — Consult Note (Signed)
Cardiology Consultation   Patient ID: Gregory Howell MRN: [id number]; DOB: 12/24/1960  Admit date: 03/16/2023 Date of Consult: 03/16/2023  PCP:  [facility] Providers Cardiologist:  Lorine Bears, MD   {   Patient Profile:   Gregory Howell is a 62 y.o. male with a hx of HFrEF, atrial fibrillation/flutter, nonobstructive CAD, CKD stage 3 who is being seen 03/16/2023 for the evaluation of CHF at the request of Dr. Renae Gloss.  History of Present Illness:   Gregory Howell reports being diagnosed with heart problems in April 2021. He reports becoming suddenly short of breath at that time. He was admitted at [facility] for new onset atrial fibrillation w/ TEE demonstrating LVEF 25-30%. LHC at that time showed mild nonobstructive CAD. He was subsequently transferred his care to the [place] where he was cardioverted to NSR last year.   He was admitted in July 2024 with acute heart failure and was seen by advanced heart failure team. He was noted to be in afib/flutter, which was felt to exacerbate heart failure. Patient also reported he ran out of lasix. The patient was diuresed and plan was for TEE/DCCv, but despite multiple attempts the TEE probe could not be passed and the procedure was aborted. The patient was started on amiodarone and discharged to follow-up with the [place].   The patient presented back to the ER 03/15/23 with progressive SOB, LLE and weight gain. He reports he ran out of his lasix and amiodarone 1-2 weeks prior. No chest pain reported. He did note palpitations.   In the ER BP 159/122, pulse 97bpm, afebrile, 96% O2. Labs showed WBC 11.3, BG 102, Scr 1.02, alk phos 151, total bili 3. BNP 1134. HS trop 57>48.EKG showed afib with a rate of 98bpm. CXR showed cardiomegaly with central pulmonary vascular congestion. The patient was given IV lasix and admitted.    Past Medical History:  Diagnosis Date   CHF (congestive heart failure) (HCC)    CKD (chronic kidney  disease), stage II    Coronary artery disease, non-occlusive    HFrEF (heart failure with reduced ejection fraction) (HCC)    Hypertension    Hypertensive heart disease    NICM (nonischemic cardiomyopathy) (HCC)    Persistent atrial fibrillation (HCC)     Past Surgical History:  Procedure Laterality Date   CARDIOVERSION N/A 03/04/2023   Procedure: CARDIOVERSION;  Surgeon: Dorthula Nettles, DO;  Location: [facility] ORS;  Service: Cardiovascular;  Laterality: N/A;   RIGHT/LEFT HEART CATH AND CORONARY ANGIOGRAPHY N/A 12/07/2019   Procedure: RIGHT/LEFT HEART CATH AND CORONARY ANGIOGRAPHY;  Surgeon: Antonieta Iba, MD;  Location: [facility] INVASIVE CV LAB;  Service: Cardiovascular;  Laterality: N/A;   TEE WITHOUT CARDIOVERSION N/A 03/04/2023   Procedure: TRANSESOPHAGEAL ECHOCARDIOGRAM;  Surgeon: Dorthula Nettles, DO;  Location: [facility] ORS;  Service: Cardiovascular;  Laterality: N/A;     Home Medications:  Prior to Admission medications   Medication Sig Start Date End Date Taking? Authorizing Provider  amiodarone (PACERONE) 200 MG tablet Take 1 tablet (200 mg total) by mouth 2 (two) times daily. For 7 days then just take once daily 03/06/23  Yes Arnetha Courser, MD  apixaban (ELIQUIS) 5 MG TABS tablet Take 1 tablet (5 mg total) by mouth 2 (two) times daily. 08/17/22  Yes Mumma, Carollee Herter, MD  carvedilol (COREG) 25 MG tablet Take 0.5 tablets (12.5 mg total) by mouth 2 (two) times daily. 03/06/23  Yes Arnetha Courser, MD  empagliflozin (JARDIANCE) 10 MG  TABS tablet Take 10 mg by mouth daily.   Yes [provider]  furosemide (LASIX) 40 MG tablet Take 40 mg by mouth 2 (two) times daily.   Yes [provider]  losartan (COZAAR) 50 MG tablet Take 1 tablet (50 mg total) by mouth daily. 03/07/23  Yes Arnetha Courser, MD  rosuvastatin (CRESTOR) 40 MG tablet Take 20 mg by mouth every evening.   Yes [provider]    Inpatient Medications: Scheduled Meds:  apixaban  5 mg Oral BID   carvedilol   12.5 mg Oral BID   empagliflozin  10 mg Oral Daily   furosemide  40 mg Intravenous Q12H   losartan  50 mg Oral Daily   rosuvastatin  20 mg Oral QPM   Continuous Infusions:  PRN Meds: acetaminophen **OR** acetaminophen, ondansetron **OR** ondansetron (ZOFRAN) IV  Allergies:    Allergies  Allergen Reactions   Lisinopril     Other reaction(s): ANGIOEDEMA OF LIPS    Social History:   Social History   Socioeconomic History   Marital status: Married    Spouse name: Not on file   Number of children: Not on file   Years of education: Not on file   Highest education level: Not on file  Occupational History   Not on file  Tobacco Use   Smoking status: Some Days    Current packs/day: 0.50    Types: Cigarettes   Smokeless tobacco: Never  Substance and Sexual Activity   Alcohol use: Yes    Alcohol/week: 7.0 - 14.0 standard drinks of alcohol    Types: 7 - 14 Cans of beer per week    Comment: daily   Drug use: Never   Sexual activity: Not on file  Other Topics Concern   Not on file  Social History Narrative   Not on file   Social Determinants of Health   Financial Resource Strain: Not on file  Food Insecurity: No Food Insecurity (03/03/2023)   Hunger Vital Sign    Worried About Running Out of Food in the Last Year: Never true    Ran Out of Food in the Last Year: Never true  Transportation Needs: No Transportation Needs (03/03/2023)   PRAPARE - Administrator, Civil Service (Medical): No    Lack of Transportation (Non-Medical): No  Physical Activity: Not on file  Stress: Not on file  Social Connections: Not on file  Intimate Partner Violence: Not At Risk (03/03/2023)   Humiliation, Afraid, Rape, and Kick questionnaire    Fear of Current or Ex-Partner: No    Emotionally Abused: No    Physically Abused: No    Sexually Abused: No    Family History:    Family History  Problem Relation Age of Onset   Hypertension Mother    Hypertension Father    CAD Brother     Diabetes Brother      ROS:  Please see the history of present illness.   All other ROS reviewed and negative.     Physical Exam/Data:   Vitals:   03/16/23 0530 03/16/23 0730 03/16/23 0732 03/16/23 1030  BP: (!) 157/116 (!) 132/105  (!) 142/103  Pulse: 91 74 67 69  Resp: 19 19 19 16   Temp:   97.6 F (36.4 C)   TempSrc:      SpO2: 95% 97% 96% 96%  Weight:      Height:        Intake/Output Summary (Last 24 hours) at 03/16/2023  1237 Last data filed at 03/16/2023 0657 Gross per 24 hour  Intake --  Output 1725 ml  Net -1725 ml      03/15/2023    9:49 PM 03/06/2023    5:06 AM 03/05/2023    4:01 AM  Last 3 Weights  Weight (lbs) 220 lb 224 lb 6.4 oz 225 lb 14.4 oz  Weight (kg) 99.791 kg 101.787 kg 102.468 kg     Body mass index is 32.49 kg/m.  General:  Well nourished, well developed, in no acute distress HEENT: normal Neck: + JVD Vascular: No carotid bruits; Distal pulses 2+ bilaterally Cardiac:  normal S1, S2; Irreg Irreg; no murmur  Lungs:  crackles at bases Abd: soft, nontender, no hepatomegaly  Ext: 2+ lower leg edema Musculoskeletal:  No deformities, BUE and BLE strength normal and equal Skin: warm and dry  Neuro:  CNs 2-12 intact, no focal abnormalities noted Psych:  Normal affect   EKG:  The EKG was personally reviewed and demonstrates:  Afib/flutter 98bpm, variable block, LVH, TWI lateral leads Telemetry:  Telemetry was personally reviewed and demonstrates:  Afib/flutter HR 80-100s  Relevant CV Studies:  Echo 02/2023  1. Left ventricular ejection fraction, by estimation, is <20%. Left  ventricular ejection fraction by PLAX is 15 %. The left ventricle has  severely decreased function. The left ventricle demonstrates global  hypokinesis. The left ventricular internal  cavity size was moderately dilated. The average left ventricular global  longitudinal strain is -3.0 %. The global longitudinal strain is abnormal.   2. Right ventricular systolic function is  moderately reduced. The right  ventricular size is normal. There is mildly elevated pulmonary artery  systolic pressure. The estimated right ventricular systolic pressure is  42.2 mmHg.   3. Left atrial size was mildly dilated.   4. Right atrial size was moderately dilated.   5. The mitral valve is normal in structure. Mild to moderate mitral valve  regurgitation. No evidence of mitral stenosis.   6. The aortic valve has an indeterminant number of cusps. Aortic valve  regurgitation is not visualized. No aortic stenosis is present.   7. The inferior vena cava is normal in size with greater than 50%  respiratory variability, suggesting right atrial pressure of 3 mmHg.    Echo 02/2021 1. Left ventricular ejection fraction, by estimation, is 30 to 35%. The  left ventricle has moderately decreased function. The left ventricle  demonstrates global hypokinesis. There is moderate left ventricular  hypertrophy. Left ventricular diastolic  parameters are indeterminate.   2. Right ventricular systolic function is moderately reduced. The right  ventricular size is normal. There is mildly elevated pulmonary artery  systolic pressure. The estimated right ventricular systolic pressure is  38.4 mmHg.   3. Left atrial size was moderately dilated.   4. Tricuspid valve regurgitation is mild to moderate.   5. Rhythm concerning for atrial fibrillation.   Echo 03/2020 1. Left ventricular ejection fraction, by estimation, is 40 to 45%. The  left ventricle has mildly decreased function. Hypokinesis of basal  regions.There is moderate left ventricular hypertrophy. Left ventricular  diastolic parameters are indeterminate.   2. Right ventricular systolic function is mildly reduced. The right  ventricular size is mildly enlarged. There is mildly elevated pulmonary  artery systolic pressure.   3. Left atrial size was severely dilated.   4. Right atrial size was moderately dilated.   R/L heart cath  11/2019 RIGHT/LEFT HEART CATH AND CORONARY ANGIOGRAPHY   Conclusion  2nd Mrg lesion  is 100% stenosed. Dist Cx lesion is 60% stenosed. 1st Mrg lesion is 40% stenosed. Ost RCA lesion is 50% stenosed. RPAV lesion is 60% stenosed. Mid RCA lesion is 30% stenosed. LV end diastolic pressure is mildly elevated. There is severe left ventricular systolic dysfunction. The left ventricular ejection fraction is less than 25% by visual estimate. Hemodynamic findings consistent with mild pulmonary hypertension.    Echo 11/2019 1. Left ventricular ejection fraction, by estimation, is 25 to 30%. The  left ventricle has severely decreased function. The left ventricle  demonstrates global hypokinesis. There is moderate left ventricular  hypertrophy. Left ventricular diastolic  parameters are indeterminate.   2. Right ventricular systolic function is severely reduced. The right  ventricular size is normal. There is moderately elevated pulmonary artery  systolic pressure. The estimated right ventricular systolic pressure is  64.8 mmHg.   3. Left atrial size was mildly dilated.   4. Right atrial size was mildly dilated.   5. The mitral valve is normal in structure. Mild mitral valve  regurgitation.   6. The aortic valve was not well visualized. Aortic valve regurgitation  is not visualized. No aortic stenosis is present.   7. The inferior vena cava is dilated in size with <50% respiratory  variability, suggesting right atrial pressure of 15 mmHg.   Laboratory Data:  High Sensitivity Troponin:   Recent Labs  Lab 03/01/23 1627 03/01/23 1925 03/15/23 2154 03/16/23 0044  TROPONINIHS 36* 49* 57* 48*     Chemistry Recent Labs  Lab 03/15/23 2154  NA 137  K 4.8  CL 105  CO2 22  GLUCOSE 102*  BUN 21  CREATININE 1.74*  CALCIUM 8.5*  MG 2.1  GFRNONAA 44*  ANIONGAP 10    Recent Labs  Lab 03/15/23 2154  PROT 7.6  ALBUMIN 3.7  AST 36  ALT 29  ALKPHOS 151*  BILITOT 3.0*   Lipids No  results for input(s): "CHOL", "TRIG", "HDL", "LABVLDL", "LDLCALC", "CHOLHDL" in the last 168 hours.  Hematology Recent Labs  Lab 03/15/23 2154  WBC 11.3*  RBC 5.13  HGB 16.0  HCT 45.7  MCV 89.1  MCH 31.2  MCHC 35.0  RDW 16.0*  PLT 229   Thyroid No results for input(s): "TSH", "FREET4" in the last 168 hours.  BNP Recent Labs  Lab 03/15/23 2154  BNP 1,134.5*    DDimer No results for input(s): "DDIMER" in the last 168 hours.   Radiology/Studies:  DG Chest 1 View  Result Date: 03/15/2023 CLINICAL DATA:  Shortness of breath EXAM: CHEST  1 VIEW COMPARISON:  Chest x-ray 03/02/2023 FINDINGS: The heart is enlarged, unchanged. There central pulmonary vascular congestion. There is no focal lung consolidation, pleural effusion or pneumothorax. No acute fractures are seen. IMPRESSION: Cardiomegaly with central pulmonary vascular congestion. Electronically Signed   By: Darliss Cheney M.D.   On: 03/15/2023 22:27     Assessment and Plan:   Acute on chronic systolic heart failure - patient presented with DOE, LLE and weight gain for the last 1-2 weeks. He ran out of his lasix and amiodarone. Noted to be in rate controlled afib/flutter - BNP 1134 and CXR with pulmonary vascular congestion - Echo 02/2023 showed LVEF <20%, global HK, moderately reduced RVSF, mild to mod MR - IV lasix 40mg  BID - continue Coreg 12.5mg  BID, Jardiance 10mg  daily, Losartan 50mg  daily - continue to monitor kidney function, strict I/Os and daily weights with diuresis - advanced CHF team to see - patient [facility] with the [facility]  as outpatient  Persistent Afib/flutter - patient ran out of amiodarone 1-2 weeks ago - he is in rate controlled afib/flutter - unable to perform TEE during last admission - continue PTA Eliquis 5mg  BID - continue Coreg for rate control - will discuss restarting amiodarone with MD  Nonobstructive CAD - patient denies chest pain - HS trop minimally elevated in the setting of afib and acute  CHF - would defer IV heparin - EKG non-acute - continue statin and BB therapy - no ASA with Eliquis - no further ischemic work-up at this time  HLD - continue Crestor 20mg  daily  HTN - continue Losartan and coreg - Bps mildly elevated  For questions or updates, please contact [facility] Please consult [email or web address] for contact info under    Signed,  David Stall, PA-C  03/16/2023 12:37 PM

## 2023-03-16 NOTE — Assessment & Plan Note (Addendum)
EF <20% 02/2023 Patient on Lasix 40 mg IV every 12 hours, Coreg twice a day, Cozaar and Jardiance

## 2023-03-16 NOTE — H&P (Signed)
History and Physical    Patient: Gregory Howell ZOX:[id number] DOB: 1961-03-01 DOA: 03/16/2023 DOS: the patient was seen and examined on 03/16/2023 PCP: [facility]  Patient coming from: Home  Chief Complaint:  Chief Complaint  Patient presents with   Shortness of Breath    HPI: Gregory Howell is a 62 y.o. male with medical history significant for Systolic CHF EF <20%02/2023,CKD 3b, CAD, hypertension and nonischemic cardiomyopathy as well as chronic fibrillation, recently hospitalized from 7/22 to 03/06/2023 with CHF exacerbation and rapid A-fib flutter treated with amiodarone infusion, as well as AKI who presents to the ED with a 2-day history of worsening shortness of breath.  Patient states since his discharge just over a week ago he has not taken any of his medications as he gets his meds via mail order from the [place] and it was sent to the wrong address and he could not secure a prescription.  He denies chest pain, cough, fever or chills. ED course and data review: BP 159/122 on arrival, tachypneic to 28 with O2 sat 96% on room air.  Labs significant for troponin 57-48 with BNP 1135 up from 1107 a couple weeks prior.  WBC 11.3.  Creatinine 1.74 which is around baseline for his CKD 3 EKG, personally viewed and interpreted showing A-fib flutter at [facility] shows with no ischemic ST-T wave changes. Chest X's cardiomegaly with central pulmonary vascular congestion Patient treated with IV Lasix Hospitalist consulted for admission.   Review of Systems: As mentioned in the history of present illness. All other systems reviewed and are negative.  Past Medical History:  Diagnosis Date   CHF (congestive heart failure) (HCC)    CKD (chronic kidney disease), stage II    Coronary artery disease, non-occlusive    HFrEF (heart failure with reduced ejection fraction) (HCC)    Hypertension    Hypertensive heart disease    NICM (nonischemic cardiomyopathy) (HCC)    Persistent atrial  fibrillation (HCC)    Past Surgical History:  Procedure Laterality Date   CARDIOVERSION N/A 03/04/2023   Procedure: CARDIOVERSION;  Surgeon: Dorthula Nettles, DO;  Location: [facility] ORS;  Service: Cardiovascular;  Laterality: N/A;   RIGHT/LEFT HEART CATH AND CORONARY ANGIOGRAPHY N/A 12/07/2019   Procedure: RIGHT/LEFT HEART CATH AND CORONARY ANGIOGRAPHY;  Surgeon: Antonieta Iba, MD;  Location: [facility] INVASIVE CV LAB;  Service: Cardiovascular;  Laterality: N/A;   TEE WITHOUT CARDIOVERSION N/A 03/04/2023   Procedure: TRANSESOPHAGEAL ECHOCARDIOGRAM;  Surgeon: Dorthula Nettles, DO;  Location: [facility] ORS;  Service: Cardiovascular;  Laterality: N/A;   Social History:  reports that he has been smoking cigarettes. He has never used smokeless tobacco. He reports current alcohol use of about 7.0 - 14.0 standard drinks of alcohol per week. He reports that he does not use drugs.  Allergies  Allergen Reactions   Lisinopril     Other reaction(s): ANGIOEDEMA OF LIPS    Family History  Problem Relation Age of Onset   Hypertension Mother    Hypertension Father    CAD Brother    Diabetes Brother     Prior to Admission medications   Medication Sig Start Date End Date Taking? Authorizing Provider  amiodarone (PACERONE) 200 MG tablet Take 1 tablet (200 mg total) by mouth 2 (two) times daily. For 7 days then just take once daily 03/06/23  Yes Arnetha Courser, MD  carvedilol (COREG) 25 MG tablet Take 0.5 tablets (12.5 mg total) by mouth 2 (two) times daily. 03/06/23  Yes Arnetha Courser,  MD  losartan (COZAAR) 50 MG tablet Take 1 tablet (50 mg total) by mouth daily. 03/07/23  Yes Arnetha Courser, MD  apixaban (ELIQUIS) 5 MG TABS tablet Take 1 tablet (5 mg total) by mouth 2 (two) times daily. 08/17/22   Corena Herter, MD  empagliflozin (JARDIANCE) 10 MG TABS tablet Take 10 mg by mouth daily.    [provider]  furosemide (LASIX) 40 MG tablet Take 40 mg by mouth 2 (two) times daily.    [provider]   rosuvastatin (CRESTOR) 40 MG tablet Take 20 mg by mouth every evening.    [provider]    Physical Exam: Vitals:   03/15/23 2149 03/16/23 0040 03/16/23 0131  BP: (!) 159/122 (!) 150/111   Pulse: 97 80   Resp: (!) 28 (!) 28   Temp: 97.6 F (36.4 C)  98.2 F (36.8 C)  TempSrc: Oral  Oral  SpO2: 96% 93%   Weight: 99.8 kg    Height: 5\' 9"  (1.753 m)     Physical Exam Vitals and nursing note reviewed.  Constitutional:      General: He is not in acute distress. HENT:     Head: Normocephalic and atraumatic.  Cardiovascular:     Rate and Rhythm: Normal rate and regular rhythm.     Heart sounds: Normal heart sounds.  Pulmonary:     Effort: Pulmonary effort is normal.     Breath sounds: Decreased breath sounds present.  Abdominal:     Palpations: Abdomen is soft.     Tenderness: There is no abdominal tenderness.  Musculoskeletal:     Right lower leg: Edema present.     Left lower leg: Edema present.  Neurological:     Mental Status: Mental status is at baseline.     Labs on Admission: I have personally reviewed following labs and imaging studies  CBC: Recent Labs  Lab 03/15/23 2154  WBC 11.3*  HGB 16.0  HCT 45.7  MCV 89.1  PLT 229   Basic Metabolic Panel: Recent Labs  Lab 03/15/23 2154  NA 137  K 4.8  CL 105  CO2 22  GLUCOSE 102*  BUN 21  CREATININE 1.74*  CALCIUM 8.5*  MG 2.1   GFR: Estimated Creatinine Clearance: 50.6 mL/min (A) (by C-G formula based on SCr of 1.74 mg/dL (H)). Liver Function Tests: Recent Labs  Lab 03/15/23 2154  AST 36  ALT 29  ALKPHOS 151*  BILITOT 3.0*  PROT 7.6  ALBUMIN 3.7   No results for input(s): "LIPASE", "AMYLASE" in the last 168 hours. No results for input(s): "AMMONIA" in the last 168 hours. Coagulation Profile: No results for input(s): "INR", "PROTIME" in the last 168 hours. Cardiac Enzymes: No results for input(s): "CKTOTAL", "CKMB", "CKMBINDEX", "TROPONINI" in the last 168 hours. BNP (last 3  results) No results for input(s): "PROBNP" in the last 8760 hours. HbA1C: No results for input(s): "HGBA1C" in the last 72 hours. CBG: No results for input(s): "GLUCAP" in the last 168 hours. Lipid Profile: No results for input(s): "CHOL", "HDL", "LDLCALC", "TRIG", "CHOLHDL", "LDLDIRECT" in the last 72 hours. Thyroid Function Tests: No results for input(s): "TSH", "T4TOTAL", "FREET4", "T3FREE", "THYROIDAB" in the last 72 hours. Anemia Panel: No results for input(s): "VITAMINB12", "FOLATE", "FERRITIN", "TIBC", "IRON", "RETICCTPCT" in the last 72 hours. Urine analysis:    Component Value Date/Time   COLORURINE YELLOW 09/09/2021 1917   APPEARANCEUR CLEAR 09/09/2021 1917   LABSPEC 1.010 09/09/2021 1917   PHURINE 6.0 09/09/2021 1917  GLUCOSEU NEGATIVE 09/09/2021 1917   HGBUR NEGATIVE 09/09/2021 1917   BILIRUBINUR NEGATIVE 09/09/2021 1917   KETONESUR NEGATIVE 09/09/2021 1917   PROTEINUR 100 (A) 09/09/2021 1917   NITRITE NEGATIVE 09/09/2021 1917   LEUKOCYTESUR NEGATIVE 09/09/2021 1917    Radiological Exams on Admission: DG Chest 1 View  Result Date: 03/15/2023 CLINICAL DATA:  Shortness of breath EXAM: CHEST  1 VIEW COMPARISON:  Chest x-ray 03/02/2023 FINDINGS: The heart is enlarged, unchanged. There central pulmonary vascular congestion. There is no focal lung consolidation, pleural effusion or pneumothorax. No acute fractures are seen. IMPRESSION: Cardiomegaly with central pulmonary vascular congestion. Electronically Signed   By: Darliss Cheney M.D.   On: 03/15/2023 22:27     Data Reviewed: Relevant notes from primary care and specialist visits, past discharge summaries as available in EHR, including Care Everywhere. Prior diagnostic testing as pertinent to current admission diagnoses Updated medications and problem lists for reconciliation ED course, including vitals, labs, imaging, treatment and response to treatment Triage notes, nursing and pharmacy notes and ED provider's  notes Notable results as noted in HPI   Assessment and Plan: * Acute on chronic HFrEF (heart failure with reduced ejection fraction) (HCC) NICM, EF <20% 02/2023 Hypertensive urgency/emergency Likely triggered by medication nonadherence IV Lasix Continue GDMT Daily weights with intake and output monitoring Cardiology consulted  Atrial flutter (HCC) Patient recently here with rapid a flutter however could not have TEE cardioversion as probe could not be passed with plans to perform on outpatient basis Cardiology consult for consideration Patient has not taken amiodarone in 2 weeks Continue carvedilol and apixaban Hold off on amiodarone pending cardiology consult as patient has not taken it in 2 weeks  Hypertensive urgency Resume home meds for BP control including losartan and carvedilol  Stage 3b chronic kidney disease (HCC) Renal function at baseline  Coronary artery disease, non-occlusive Continue Crestor, apixaban, carvedilol and losartan     DVT prophylaxis: Apixaban  Consults: [facility] cardiology  Advance Care Planning:   Code Status: Prior   Family Communication: none  Disposition Plan: Back to previous home environment  Severity of Illness: The appropriate patient status for this patient is OBSERVATION. Observation status is judged to be reasonable and necessary in order to provide the required intensity of service to ensure the patient's safety. The patient's presenting symptoms, physical exam findings, and initial radiographic and laboratory data in the context of their medical condition is felt to place them at decreased risk for further clinical deterioration. Furthermore, it is anticipated that the patient will be medically stable for discharge from the hospital within 2 midnights of admission.   Author: Andris Baumann, MD 03/16/2023 2:34 AM  For on call review [email or web address].

## 2023-03-16 NOTE — ED Notes (Signed)
Pt provided hospital snack tray and ginger ale per request.

## 2023-03-16 NOTE — Assessment & Plan Note (Addendum)
Blood pressure trending better but still high on Coreg and losartan.

## 2023-03-16 NOTE — Hospital Course (Addendum)
62 y.o. male with medical history significant for Systolic CHF EF <20%02/2023,CKD 3b, CAD, hypertension and nonischemic cardiomyopathy as well as chronic fibrillation, recently hospitalized from 7/22 to 03/06/2023 with CHF exacerbation and rapid A-fib flutter treated with amiodarone infusion, as well as AKI who presents to the ED with a 2-day history of worsening shortness of breath.  Patient states since his discharge just over a week ago he has not taken any of his medications as he gets his meds via mail order from the [place] and it was sent to the wrong address and he could not secure a prescription.  He denies chest pain, cough, fever or chills. ED course and data review: BP 159/122 on arrival, tachypneic to 28 with O2 sat 96% on room air.  Labs significant for troponin 57-48 with BNP 1135 up from 1107 a couple weeks prior.  WBC 11.3.  Creatinine 1.74 which is around baseline for his CKD 3 EKG, personally viewed and interpreted showing A-fib flutter at [facility] shows with no ischemic ST-T wave changes. Chest X's cardiomegaly with central pulmonary vascular congestion Patient treated with IV Lasix Condition much improved, medically stable to be discharged.  Per patient, he already got his diuretics mailed to his house.

## 2023-03-16 NOTE — Assessment & Plan Note (Addendum)
Continue to monitor with diuresis

## 2023-03-16 NOTE — ED Notes (Signed)
When sleeping pts room air SpO2 dropping to mid-80s. When awake pts room air SpO2 98%. Oxygen placed on pt at 2L via [place] to facilitate appropriate SpO2 when pt is sleeping. Pt denies CP, dizziness, lightheadedness, and blurred vision at this time. Pt reports he does feel like he is breathing easier after urinating x3 after IV lasix administration.

## 2023-03-16 NOTE — Assessment & Plan Note (Deleted)
Continue Crestor, apixaban, carvedilol and losartan

## 2023-03-16 NOTE — Assessment & Plan Note (Addendum)
-   Continue Coreg and Eliquis 

## 2023-03-16 NOTE — Progress Notes (Signed)
Progress Note   Patient: Gregory Howell XBM:[id number] DOB: 1960/10/09 DOA: 03/16/2023     0 DOS: the patient was seen and examined on 03/16/2023   Brief hospital course: 62 y.o. male with medical history significant for Systolic CHF EF <20%02/2023,CKD 3b, CAD, hypertension and nonischemic cardiomyopathy as well as chronic fibrillation, recently hospitalized from 7/22 to 03/06/2023 with CHF exacerbation and rapid A-fib flutter treated with amiodarone infusion, as well as AKI who presents to the ED with a 2-day history of worsening shortness of breath.  Patient states since his discharge just over a week ago he has not taken any of his medications as he gets his meds via mail order from the [place] and it was sent to the wrong address and he could not secure a prescription.  He denies chest pain, cough, fever or chills. ED course and data review: BP 159/122 on arrival, tachypneic to 28 with O2 sat 96% on room air.  Labs significant for troponin 57-48 with BNP 1135 up from 1107 a couple weeks prior.  WBC 11.3.  Creatinine 1.74 which is around baseline for his CKD 3 EKG, personally viewed and interpreted showing A-fib flutter at [facility] shows with no ischemic ST-T wave changes. Chest X's cardiomegaly with central pulmonary vascular congestion Patient treated with IV Lasix Hospitalist consulted for admission.   8/6.  Patient still feeling short of breath but feels better than when he came in.  States he had trouble obtaining his Lasix from the [place].  He states he has his other medications.  Admitted with CHF exacerbation.  Assessment and Plan: * Acute on chronic HFrEF (heart failure with reduced ejection fraction) (HCC) EF <20% 02/2023 Patient on Lasix 40 mg IV every 12 hours, Coreg twice a day, Cozaar and Jardiance  Hypertensive urgency Blood pressure trending better but still high on Coreg and losartan.  Atrial flutter (HCC) Continue Coreg and Eliquis.  Stage 3b chronic kidney disease (HCC) Continue to  monitor with diuresis.  CAD (coronary artery disease) Continue Coreg and Crestor.  Obesity (BMI 30-39.9) BMI 32.49 with current height and weight in computer.  Elevated bilirubin Right upper quadrant ultrasound done last month showed no evidence of cholecystitis, did show hepatomegaly.        Subjective: Patient states that he had trouble picking up his Lasix from the [place].  Coming in with shortness of breath and weight gain.  Physical Exam: Vitals:   03/16/23 0730 03/16/23 0732 03/16/23 1030 03/16/23 1400  BP: (!) 132/105  (!) 142/103 (!) 143/98  Pulse: 74 67 69 84  Resp: 19 19 16  (!) 24  Temp:  97.6 F (36.4 C)    TempSrc:      SpO2: 97% 96% 96% 95%  Weight:      Height:       Physical Exam HENT:     Head: Normocephalic.     Mouth/Throat:     Pharynx: No oropharyngeal exudate.  Eyes:     General: Lids are normal.     Conjunctiva/sclera: Conjunctivae normal.  Cardiovascular:     Rate and Rhythm: Normal rate and regular rhythm.     Heart sounds: Normal heart sounds, S1 normal and S2 normal.  Pulmonary:     Breath sounds: No decreased breath sounds, wheezing, rhonchi or rales.  Abdominal:     Palpations: Abdomen is soft.     Tenderness: There is no abdominal tenderness.  Musculoskeletal:     Right lower leg: Swelling present.     Left  lower leg: Swelling present.  Skin:    General: Skin is warm.     Findings: No rash.  Neurological:     Mental Status: He is alert and oriented to person, place, and time.     Data Reviewed: Creatinine of 1.74, potassium 4.8, total bilirubin 3.0, BNP 1134, troponin 57 and 48, white blood cell count 11.3, hemoglobin 16, platelets 229  Family Communication: Declined  Disposition: Status is: Inpatient Remains inpatient appropriate because: Continue IV diuresis  Planned Discharge Destination: Home    Time spent: 28 minutes  Author: Alford Highland, MD 03/16/2023 2:47 PM  For on call review [email or web address].

## 2023-03-16 NOTE — Assessment & Plan Note (Signed)
-  Continue Coreg and Crestor

## 2023-03-17 ENCOUNTER — Other Ambulatory Visit: Payer: Self-pay

## 2023-03-17 DIAGNOSIS — I4892 Unspecified atrial flutter: Secondary | ICD-10-CM | POA: Diagnosis not present

## 2023-03-17 DIAGNOSIS — I160 Hypertensive urgency: Secondary | ICD-10-CM | POA: Diagnosis not present

## 2023-03-17 DIAGNOSIS — I5023 Acute on chronic systolic (congestive) heart failure: Secondary | ICD-10-CM | POA: Diagnosis not present

## 2023-03-17 DIAGNOSIS — N1832 Chronic kidney disease, stage 3b: Secondary | ICD-10-CM | POA: Diagnosis not present

## 2023-03-17 NOTE — Consult Note (Signed)
ADVANCED HEART FAILURE CONSULT NOTE  HPI: Gregory Howell is a 62 y.o. male with HFrEF, atrial fibrillation, nonobstructive CAD presenting with several days of dyspnea and LE edema. He reports being diagnosed with heart problems in April 2021.He reports becoming suddenly SOB at that time. He was admitted at [facility] for new onset atrial fibrillation w/ TTE demonstrating LVEF of 25%-30%. LHC at that time with mild nonobstructive CAD. He subsequently transferred his care to the [place] where he was cardioverted to NSR last year.   He was admitted to [facility] last month after he stopped taking GDMT and lasix (ran out of meds). During that admission he was diuresed with IV lasix and attempts were made for TEE/DCCV for underlying AFL; however, TEE probe could not be passed. He reports not receiving lasix from the [facility] after discharge. He is unsure which medications he has been taking at home. Due to dietary indiscretion and lack of meds he presents once more with volume overload and atrial flutter.   In the ER, sCR 1.74, BNP 1100 & telemetry with AFL.   Past Medical History:  Diagnosis Date   CHF (congestive heart failure) (HCC)    CKD (chronic kidney disease), stage II    Coronary artery disease, non-occlusive    HFrEF (heart failure with reduced ejection fraction) (HCC)    Hypertension    Hypertensive heart disease    NICM (nonischemic cardiomyopathy) (HCC)    Persistent atrial fibrillation (HCC)     Current Facility-Administered Medications  Medication Dose Route Frequency Provider Last Rate Last Admin   acetaminophen (TYLENOL) tablet 650 mg  650 mg Oral Q6H PRN Andris Baumann, MD       Or   acetaminophen (TYLENOL) suppository 650 mg  650 mg Rectal Q6H PRN Andris Baumann, MD       apixaban Everlene Balls) tablet 5 mg  5 mg Oral BID Lindajo Royal V, MD   5 mg at 03/17/23 1019   carvedilol (COREG) tablet 12.5 mg  12.5 mg Oral BID Lindajo Royal V, MD   12.5 mg at 03/17/23 1011   empagliflozin (JARDIANCE)  tablet 10 mg  10 mg Oral Daily Lindajo Royal V, MD   10 mg at 03/17/23 1011   furosemide (LASIX) injection 40 mg  40 mg Intravenous Q12H Andris Baumann, MD   40 mg at 03/17/23 0835   losartan (COZAAR) tablet 50 mg  50 mg Oral Daily Lindajo Royal V, MD   50 mg at 03/17/23 1019   ondansetron (ZOFRAN) tablet 4 mg  4 mg Oral Q6H PRN Andris Baumann, MD       Or   ondansetron [facility]) injection 4 mg  4 mg Intravenous Q6H PRN Andris Baumann, MD       rosuvastatin (CRESTOR) tablet 20 mg  20 mg Oral QPM Andris Baumann, MD   20 mg at 03/16/23 2034    Allergies  Allergen Reactions   Lisinopril     Other reaction(s): ANGIOEDEMA OF LIPS      Social History   Socioeconomic History   Marital status: Married    Spouse name: Not on file   Number of children: Not on file   Years of education: Not on file   Highest education level: Not on file  Occupational History   Not on file  Tobacco Use   Smoking status: Some Days    Current packs/day: 0.50    Types: Cigarettes   Smokeless tobacco: Never  Substance and  Sexual Activity   Alcohol use: Yes    Alcohol/week: 7.0 - 14.0 standard drinks of alcohol    Types: 7 - 14 Cans of beer per week    Comment: daily   Drug use: Never   Sexual activity: Not on file  Other Topics Concern   Not on file  Social History Narrative   Not on file   Social Determinants of Health   Financial Resource Strain: Not on file  Food Insecurity: No Food Insecurity (03/17/2023)   Hunger Vital Sign    Worried About Running Out of Food in the Last Year: Never true    Ran Out of Food in the Last Year: Never true  Transportation Needs: No Transportation Needs (03/17/2023)   PRAPARE - Administrator, Civil Service (Medical): No    Lack of Transportation (Non-Medical): No  Physical Activity: Not on file  Stress: Not on file  Social Connections: Not on file  Intimate Partner Violence: Not At Risk (03/17/2023)   Humiliation, Afraid, Rape, and Kick  questionnaire    Fear of Current or Ex-Partner: No    Emotionally Abused: No    Physically Abused: No    Sexually Abused: No      Family History  Problem Relation Age of Onset   Hypertension Mother    Hypertension Father    CAD Brother    Diabetes Brother     PHYSICAL EXAM: Vitals:   03/17/23 0944 03/17/23 1353  BP:  (!) 149/123  Pulse:  82  Resp:  20  Temp: 97.9 F (36.6 C) 97.8 F (36.6 C)  SpO2:  96%   GENERAL: Well nourished, well developed, and in no apparent distress at rest.  HEENT: Negative for arcus senilis or xanthelasma. There is no scleral icterus.  The mucous membranes are pink and moist.   NECK: Supple, No masses. Normal carotid upstrokes without bruits. No masses or thyromegaly.    CHEST: There are no chest wall deformities. There is no chest wall tenderness. Respirations are unlabored.  Lungs- decreased at bases CARDIAC:  JVP: 10 cm H2O         Normal S1, S2  Normal rate with irregular rhythm. No murmurs, rubs or gallops.  Pulses are 2+ and symmetrical in upper and lower extremities. 1+ edema.  ABDOMEN: Soft, non-tender, non-distended. There are no masses or hepatomegaly. There are normal bowel sounds.  EXTREMITIES: Warm and well perfused with no cyanosis, clubbing.  LYMPHATIC: No axillary or supraclavicular lymphadenopathy.  NEUROLOGIC: Patient is oriented x3 with no focal or lateralizing neurologic deficits.  PSYCH: Patients affect is appropriate, there is no evidence of anxiety or depression.  SKIN: Warm and dry; no lesions or wounds.   DATA REVIEW  ECG: 03/16/23: AFL with variable conduction  As per my personal interpretation  ECHO: 03/04/23: LVEF 20%, moderately reduced RV function  CATH: 12/07/22: 2nd Mrg lesion is 100% stenosed. Dist Cx lesion is 60% stenosed. 1st Mrg lesion is 40% stenosed. Ost RCA lesion is 50% stenosed. RPAV lesion is 60% stenosed. Mid RCA lesion is 30% stenosed. LV end diastolic pressure is mildly elevated. There is  severe left ventricular systolic dysfunction. The left ventricular ejection fraction is less than 25% by visual estimate. Hemodynamic findings consistent with mild pulmonary hypertension.   ASSESSMENT & PLAN:  Acute on chronic HFrEF exacerbation  - Secondary to dietary and medication noncompliance; will provide meds in hand at discharge. He also becomes quickly decompensated due to underlying atrial flutter.  -  Previously discharged home on losartan 50mg  daily, jardiance 10mg  and coreg 12.5mg  BID.  - Volume overloaded on exam; continue diuresis with IV lasix 60mg  BID today; can likely transition to PO tomorrow.   2. Atrial flutter / fibrillation - Has been off amiodarone - He would benefit from sinus rhythm - If patient is amenable to repeat TEE/ DCCV attempt this admission would consider moving forward with it, otherwise, refer to EP for ablation.  - Continue apixaban; restart apixaban 200mg  BID    Advanced Heart Failure Mechanical Circulatory Support

## 2023-03-17 NOTE — TOC CM/SW Note (Signed)
Cm received TOC consult for SNF placement. Cm awaiting for PT/OT evaluations for recommendations. Cm will continue to follow.

## 2023-03-17 NOTE — Progress Notes (Signed)
Patient arrived to unit from ER. AAox4, denies pain. Vss, pt states he is here "to get fluid off". Oriented to room/ call bell and bed.. left bed in lowest position, call bell within reach

## 2023-03-17 NOTE — ED Notes (Signed)
Provided snack box and ginger ale

## 2023-03-17 NOTE — Progress Notes (Signed)
Progress Note   Patient: Gregory Howell BJY:[id number] DOB: 22-Dec-1960 DOA: 03/16/2023     1 DOS: the patient was seen and examined on 03/17/2023   Brief hospital course: 62 y.o. male with medical history significant for Systolic CHF EF <20%02/2023,CKD 3b, CAD, hypertension and nonischemic cardiomyopathy as well as chronic fibrillation, recently hospitalized from 7/22 to 03/06/2023 with CHF exacerbation and rapid A-fib flutter treated with amiodarone infusion, as well as AKI who presents to the ED with a 2-day history of worsening shortness of breath.  Patient states since his discharge just over a week ago he has not taken any of his medications as he gets his meds via mail order from the [place] and it was sent to the wrong address and he could not secure a prescription.  He denies chest pain, cough, fever or chills. ED course and data review: BP 159/122 on arrival, tachypneic to 28 with O2 sat 96% on room air.  Labs significant for troponin 57-48 with BNP 1135 up from 1107 a couple weeks prior.  WBC 11.3.  Creatinine 1.74 which is around baseline for his CKD 3 EKG, personally viewed and interpreted showing A-fib flutter at [facility] shows with no ischemic ST-T wave changes. Chest X's cardiomegaly with central pulmonary vascular congestion Patient treated with IV Lasix Hospitalist consulted for admission.  Patient has been followed by cardiology.   Principal Problem:   Acute on chronic HFrEF (heart failure with reduced ejection fraction) (HCC) Active Problems:   Hypertensive urgency   Atrial flutter (HCC)   Stage 3b chronic kidney disease (HCC)   CAD (coronary artery disease)   Obesity (BMI 30-39.9)   Elevated bilirubin   Assessment and Plan: * Acute on chronic HFrEF (heart failure with reduced ejection fraction) (HCC) EF <20% 02/2023 Patient on Lasix 40 mg IV every 12 hours, Coreg twice a day, Cozaar and Jardiance. Patient has been seen by cardiology, continue IV Lasix for now.  Monitor renal  function and electrolytes.  Hypertensive urgency Restarted home medicines.  Atrial flutter (HCC) Continue Coreg and Eliquis.  Heart rate much better controlled.  Stage 3b chronic kidney disease (HCC) Continue to follow renal function after diuretics.  CAD (coronary artery disease) Continue Coreg and Crestor.  Obesity (BMI 30-39.9) BMI 32.49 with current height and weight in computer.  Elevated bilirubin Right upper quadrant ultrasound done last month showed no evidence of cholecystitis, did show hepatomegaly.       Subjective:  Patient feel better today, no significant short of breath today.  Physical Exam: Vitals:   03/17/23 0600 03/17/23 0942 03/17/23 0944 03/17/23 1353  BP: 135/89 (!) 138/118  (!) 149/123  Pulse: 76 74  82  Resp: 18 (!) 31  20  Temp:   97.9 F (36.6 C) 97.8 F (36.6 C)  TempSrc:   Oral   SpO2: 91% 94%  96%  Weight:      Height:       General exam: Appears calm and comfortable  Respiratory system: Clear to auscultation. Respiratory effort normal. Cardiovascular system: S1 & S2 heard, RRR. No JVD, murmurs, rubs, gallops or clicks.  Gastrointestinal system: Abdomen is nondistended, soft and nontender. No organomegaly or masses felt. Normal bowel sounds heard. Central nervous system: Alert and oriented. No focal neurological deficits. Extremities: 1+ leg edema Skin: No rashes, lesions or ulcers Psychiatry: Judgement and insight appear normal. Mood & affect appropriate.    Data Reviewed:  Reviewed chest x-ray and lab results.  Family Communication: None  Disposition: Status  is: Inpatient Remains inpatient appropriate because: Severity of disease and IV treatment.     Time spent: 35 minutes  Author: Marrion Coy, MD 03/17/2023 2:07 PM  For on call review [email or web address].

## 2023-03-17 NOTE — Plan of Care (Signed)
Patient states he always takes his medications but this time he ran out and the [place] did not send them correctly

## 2023-03-18 ENCOUNTER — Other Ambulatory Visit (HOSPITAL_COMMUNITY): Payer: Self-pay

## 2023-03-18 ENCOUNTER — Inpatient Hospital Stay: Payer: No Typology Code available for payment source

## 2023-03-18 DIAGNOSIS — I4892 Unspecified atrial flutter: Secondary | ICD-10-CM | POA: Diagnosis not present

## 2023-03-18 DIAGNOSIS — I5023 Acute on chronic systolic (congestive) heart failure: Secondary | ICD-10-CM | POA: Diagnosis not present

## 2023-03-18 DIAGNOSIS — N1832 Chronic kidney disease, stage 3b: Secondary | ICD-10-CM | POA: Diagnosis not present

## 2023-03-18 MED ORDER — LOSARTAN POTASSIUM 50 MG PO TABS
50.0000 mg | ORAL_TABLET | Freq: Every day | ORAL | Status: DC
  Administered 2023-03-18 – 2023-03-19 (×2): 50 mg via ORAL
  Filled 2023-03-18 (×2): qty 1

## 2023-03-18 MED ORDER — LOSARTAN POTASSIUM 50 MG PO TABS: 100.0000 mg | ORAL_TABLET | Freq: Every day | ORAL | Status: DC

## 2023-03-18 MED ORDER — IOHEXOL 300 MG/ML  SOLN
150.0000 mL | Freq: Once | INTRAMUSCULAR | Status: AC | PRN
  Administered 2023-03-18: 37.5 mL via ORAL

## 2023-03-18 MED ORDER — OXYCODONE-ACETAMINOPHEN 5-325 MG PO TABS
1.0000 | ORAL_TABLET | Freq: Four times a day (QID) | ORAL | Status: DC | PRN
  Administered 2023-03-18 – 2023-03-20 (×5): 1 via ORAL
  Filled 2023-03-18 (×5): qty 1

## 2023-03-18 MED ORDER — TRAZODONE HCL 50 MG PO TABS
25.0000 mg | ORAL_TABLET | Freq: Every day | ORAL | Status: AC
  Administered 2023-03-18: 25 mg via ORAL
  Filled 2023-03-18: qty 1

## 2023-03-18 MED ORDER — LOSARTAN POTASSIUM 50 MG PO TABS
100.0000 mg | ORAL_TABLET | Freq: Every day | ORAL | Status: DC
  Administered 2023-03-19 – 2023-03-20 (×2): 100 mg via ORAL
  Filled 2023-03-18 (×2): qty 2

## 2023-03-18 NOTE — Progress Notes (Signed)
Cardiology Progress Note   Patient Name: Gregory Howell Date of Encounter: 03/18/2023  Primary Cardiologist: Lorine Bears, MD  Subjective   Breathing and swelling improving, though not quite at prior baseline.  Notes that since his TEE last month, he has been having hoarseness and dysphagia - both of these improving slightly.  Inpatient Medications    Scheduled Meds:  apixaban  5 mg Oral BID   carvedilol  12.5 mg Oral BID   empagliflozin  10 mg Oral Daily   furosemide  40 mg Intravenous Q12H   [START ON 03/19/2023] losartan  100 mg Oral Daily   losartan  50 mg Oral Daily   rosuvastatin  20 mg Oral QPM   Continuous Infusions:  PRN Meds: acetaminophen **OR** acetaminophen, ondansetron **OR** ondansetron (ZOFRAN) IV, oxyCODONE-acetaminophen   Vital Signs    Vitals:   03/17/23 2319 03/18/23 0308 03/18/23 0740 03/18/23 1157  BP: (!) 139/106 (!) 145/91 (!) 152/109 (!) 163/105  Pulse: 80 79 86 81  Resp: 16 16 16 20   Temp: 98.2 F (36.8 C) 98.4 F (36.9 C) 97.7 F (36.5 C) 97.7 F (36.5 C)  TempSrc:   Oral Oral  SpO2: 96% 96% 96% 95%  Weight:      Height:        Intake/Output Summary (Last 24 hours) at 03/18/2023 1219 Last data filed at 03/18/2023 0647 Gross per 24 hour  Intake --  Output 1675 ml  Net -1675 ml   Filed Weights   03/15/23 2149  Weight: 99.8 kg    Physical Exam   GEN: Well nourished, well developed, in no acute distress.  HEENT: Grossly normal.  Neck: Supple, moderately elevated JVD, no bruits or masses. Cardiac: Irregular, no murmurs, rubs, or gallops. No clubbing, cyanosis, 1+ bilateral lower extremity edema.  Radials 2+, DP/PT 2+ and equal bilaterally.  Respiratory:  Respirations regular and unlabored, clear to auscultation bilaterally. GI: Semiformed., nontender,  BS + x 4. MS: no deformity or atrophy. Skin: warm and dry, no rash. Neuro:  Strength and sensation are intact. Psych: AAOx3.  Normal affect.  Labs    Chemistry Recent Labs   Lab 03/15/23 2154 03/17/23 0411 03/18/23 0452  NA 137 139 135  K 4.8 3.6 3.5  CL 105 101 103  CO2 22 28 26   GLUCOSE 102* 128* 101*  BUN 21 27* 30*  CREATININE 1.74* 2.02* 2.04*  CALCIUM 8.5* 8.4* 7.9*  PROT 7.6  --   --   ALBUMIN 3.7  --   --   AST 36  --   --   ALT 29  --   --   ALKPHOS 151*  --   --   BILITOT 3.0*  --   --   GFRNONAA 44* 36* 36*  ANIONGAP 10 10 6      Hematology Recent Labs  Lab 03/15/23 2154 03/17/23 0411 03/18/23 0452  WBC 11.3* 10.6* 11.2*  RBC 5.13 4.85 4.91  HGB 16.0 15.1 15.4  HCT 45.7 41.9 42.9  MCV 89.1 86.4 87.4  MCH 31.2 31.1 31.4  MCHC 35.0 36.0 35.9  RDW 16.0* 15.6* 15.7*  PLT 229 202 210    Cardiac Enzymes  Recent Labs  Lab 03/01/23 1627 03/01/23 1925 03/15/23 2154 03/16/23 0044  TROPONINIHS 36* 49* 57* 48*      BNP    Component Value Date/Time   BNP 1,134.5 (H) 03/15/2023 2154    HbA1c  Lab Results  Component Value Date   HGBA1C 5.6 09/10/2021  Radiology    DG Chest 1 View  Result Date: 03/15/2023 CLINICAL DATA:  Shortness of breath EXAM: CHEST  1 VIEW COMPARISON:  Chest x-ray 03/02/2023 FINDINGS: The heart is enlarged, unchanged. There central pulmonary vascular congestion. There is no focal lung consolidation, pleural effusion or pneumothorax. No acute fractures are seen. IMPRESSION: Cardiomegaly with central pulmonary vascular congestion. Electronically Signed   By: Darliss Cheney M.D.   On: 03/15/2023 22:27    Telemetry    Atrial flutter, 70's to 80's - Personally Reviewed  Cardiac Studies   2D Echocardiogram 7.25.2024  1. Left ventricular ejection fraction, by estimation, is <20%. Left  ventricular ejection fraction by PLAX is 15 %. The left ventricle has  severely decreased function. The left ventricle demonstrates global  hypokinesis. The left ventricular internal  cavity size was moderately dilated. The average left ventricular global  longitudinal strain is -3.0 %. The global longitudinal strain  is abnormal.   2. Right ventricular systolic function is moderately reduced. The right  ventricular size is normal. There is mildly elevated pulmonary artery  systolic pressure. The estimated right ventricular systolic pressure is  42.2 mmHg.   3. Left atrial size was mildly dilated.   4. Right atrial size was moderately dilated.   5. The mitral valve is normal in structure. Mild to moderate mitral valve  regurgitation. No evidence of mitral stenosis.   6. The aortic valve has an indeterminant number of cusps. Aortic valve  regurgitation is not visualized. No aortic stenosis is present.   7. The inferior vena cava is normal in size with greater than 50%  respiratory variability, suggesting right atrial pressure of 3 mmHg.    Patient Profile     62 y.o. male with a hx of HFrEF, atrial fibrillation/flutter, nonobstructive CAD, and CKD stage 3, who was admitted 8/6 w/ recurrent CHF.  Assessment & Plan    1.  Acute on chronic heart failure with reduced ejection fraction/nonischemic cardiomyopathy: EF less than 40% by echo in July 2024.  Readmitted August 6 with volume overload in the setting of noncompliance.  He continues to respond well to intravenous Lasix with stable renal function.  Intakes are incomplete though urine output is good with 5.1 L out.  Will ask for daily weights.  Still with evidence of volume overload including JVD, increased abdominal girth, lower extremity edema.  Continue IV diuresis today along with beta-blocker, ARB, and Jardiance.  Pharmacy price addressed and unfortunately, it will not be affordable for him.  We considered addition of spironolactone though with history of noncompliance, would be concerned that he would not show up for labs.  Can reconsider in the future pending outpatient compliance and follow-up.  2.  Persistent atrial fibrillation/flutter: Status post attempted TEE cardioversion last month though unable to pass TEE probe and procedure was aborted.  He  remains in rate controlled atrial flutter predominantly in the 70s to 80s.  Continue beta-blocker and Eliquis therapy.  Following 3 to 4 weeks of therapeutic anticoagulation and compliance with therapy, could consider cardioversion.  3.  HTN: Blood pressure elevated.  Increasing losartan today.  Continue carvedilol.  Consider BiDil.  4.  Hyperlipidemia: Continue rosuvastatin therapy.  5.  Nonobstructive CAD/demand ischemia: Mild troponin elevation with flat trend.  No chest pain.  Prior nonobstructive CAD on cath.  Continue beta-blocker and statin.  No aspirin in the setting of Eliquis.  Signed, Nicolasa Ducking, NP  03/18/2023, 12:19 PM    For questions or updates, please contact  Please consult [email or web address] for contact info under Cardiology/STEMI.

## 2023-03-18 NOTE — TOC Benefit Eligibility Note (Signed)
Patient Product/process development scientist completed.    The patient is insured through [facility] Patient has [employer], may use a copay card, and/or apply for patient assistance if available.    Ran test claim for Entresto 24-26 mg and the current 30 day co-pay is $641.65 due to a $7,750.00 deductible.   This test claim was processed through [facility]- copay amounts may vary at other pharmacies due to pharmacy/plan contracts, or as the patient moves through the different stages of their insurance plan.     Roland Earl, CPHT Pharmacy Patient Advocate Specialist [facility] Patient Advocate Team Direct Number: [phone]  Fax: [phone]

## 2023-03-18 NOTE — Plan of Care (Signed)
  Problem: Safety: Goal: Ability to remain free from injury will improve Outcome: Progressing   Problem: Pain Managment: Goal: General experience of comfort will improve Outcome: Progressing   Problem: Clinical Measurements: Goal: Cardiovascular complication will be avoided Outcome: Progressing   Problem: Clinical Measurements: Goal: Respiratory complications will improve Outcome: Progressing   Problem: Clinical Measurements: Goal: Ability to maintain clinical measurements within normal limits will improve Outcome: Progressing

## 2023-03-18 NOTE — Discharge Instructions (Signed)

## 2023-03-18 NOTE — Progress Notes (Signed)
Progress Note   Patient: Gregory Howell MWN:[id number] DOB: [birth year] DOA: 03/16/2023     2 DOS: the patient was seen and examined on 03/18/2023   Brief hospital course: 62 y.o. male with medical history significant for Systolic CHF EF <20%02/2023,CKD 3b, CAD, hypertension and nonischemic cardiomyopathy as well as chronic fibrillation, recently hospitalized from 7/22 to 03/06/2023 with CHF exacerbation and rapid A-fib flutter treated with amiodarone infusion, as well as AKI who presents to the ED with a 2-day history of worsening shortness of breath.  Patient states since his discharge just over a week ago he has not taken any of his medications as he gets his meds via mail order from the [place] and it was sent to the wrong address and he could not secure a prescription.  He denies chest pain, cough, fever or chills. ED course and data review: BP 159/122 on arrival, tachypneic to 28 with O2 sat 96% on room air.  Labs significant for troponin 57-48 with BNP 1135 up from 1107 a couple weeks prior.  WBC 11.3.  Creatinine 1.74 which is around baseline for his CKD 3 EKG, personally viewed and interpreted showing [facility]-fib flutter at [facility] shows with no ischemic ST-T wave changes. Chest X's cardiomegaly with central pulmonary vascular congestion Patient treated with IV Lasix Hospitalist consulted for admission.  Patient has been followed by cardiology.   Principal Problem:   Acute on chronic HFrEF (heart failure with reduced ejection fraction) (HCC) Active Problems:   Hypertensive urgency   Atrial flutter (HCC)   Stage 3b chronic kidney disease (HCC)   CAD (coronary artery disease)   Obesity (BMI 30-39.9)   Elevated bilirubin   Assessment and Plan:  * Acute on chronic HFrEF (heart failure with reduced ejection fraction) (HCC) EF <20% 02/2023 Patient on Lasix 40 mg IV every 12 hours, Coreg twice a day, Cozaar and Jardiance. Appreciate cardiology consult.  Continue IV Lasix.  Dysphagia. Patient had  a history of dysphagia, TEE was unsuccessful in cardiologist office because of this.  Barium esophagram did not show any esophageal stricture, ENT can see patient today.   Hypertensive urgency Restarted home medicines.   Atrial flutter (HCC) Continue Coreg and Eliquis.  Heart rate much better controlled.   Stage 3b chronic kidney disease (HCC) Renal function is relatively stable.   CAD (coronary artery disease) Continue Coreg and Crestor.   Obesity (BMI 30-39.9) Diet and exercise advised.   Elevated bilirubin Right upper quadrant ultrasound done last month showed no evidence of cholecystitis, did show hepatomegaly.     Subjective: Patient feels better today, less short of breath, still have a cough but nonproductive.  Physical Exam: Vitals:   03/17/23 2319 03/18/23 0308 03/18/23 0740 03/18/23 1157  BP: (!) 139/106 (!) 145/91 (!) 152/109 (!) 163/105  Pulse: 80 79 86 81  Resp: 16 16 16 20   Temp: 98.2 F (36.8 C) 98.4 F (36.9 C) 97.7 F (36.5 C) 97.7 F (36.5 C)  TempSrc:   Oral Oral  SpO2: 96% 96% 96% 95%  Weight:      Height:       General exam: Appears calm and comfortable  Respiratory system: Clear to auscultation. Respiratory effort normal. Cardiovascular system: S1 & S2 heard, RRR. No JVD, murmurs, rubs, gallops or clicks.  Gastrointestinal system: Abdomen is nondistended, soft and nontender. No organomegaly or masses felt. Normal bowel sounds heard. Central nervous system: Alert and oriented. No focal neurological deficits. Extremities: Leg edema improving. Skin: No rashes, lesions or ulcers  Psychiatry: Judgement and insight appear normal. Mood & affect appropriate.    Data Reviewed:  Lab results reviewed.  Family Communication: Wife updated at the bedside.  Disposition: Status is: Inpatient Remains inpatient appropriate because: Severity of disease, IV treatment.     Time spent: 35 minutes  Author: Marrion Coy, MD 03/18/2023 1:14 PM  For on  call review [email or web address].

## 2023-03-18 NOTE — Progress Notes (Signed)
Consult to HF Navigation Team Placed. Unfortunately, this patient does not meet criteria due to following with advanced HF Team. Will sign off and follow alongside Advanced Heart Failure Team. Please feel free to reach out with any questions or medication assistance needs.   Thank you for involving the HF Navigation Team in this patient's care.   Enos Fling, PharmD, BCPS Clinical Pharmacist 03/18/2023 12:50 PM

## 2023-03-18 NOTE — Plan of Care (Signed)
  Problem: Education: Goal: Ability to demonstrate management of disease process will improve Outcome: Progressing Goal: Ability to verbalize understanding of medication therapies will improve Outcome: Progressing Goal: Individualized Educational Video(s) Outcome: Progressing   Problem: Activity: Goal: Capacity to carry out activities will improve Outcome: Progressing   Problem: Cardiac: Goal: Ability to achieve and maintain adequate cardiopulmonary perfusion will improve Outcome: Progressing   Problem: Education: Goal: Knowledge of General Education information will improve Description: Including pain rating scale, medication(s)/side effects and non-pharmacologic comfort measures Outcome: Progressing   Problem: Clinical Measurements: Goal: Ability to maintain clinical measurements within normal limits will improve Outcome: Progressing Goal: Will remain free from infection Outcome: Progressing Goal: Diagnostic test results will improve Outcome: Progressing Goal: Respiratory complications will improve Outcome: Progressing Goal: Cardiovascular complication will be avoided Outcome: Progressing   Problem: Coping: Goal: Level of anxiety will decrease Outcome: Progressing   Problem: Elimination: Goal: Will not experience complications related to bowel motility Outcome: Progressing Goal: Will not experience complications related to urinary retention Outcome: Progressing   Problem: Pain Managment: Goal: General experience of comfort will improve Outcome: Progressing   Problem: Skin Integrity: Goal: Risk for impaired skin integrity will decrease Outcome: Progressing

## 2023-03-18 NOTE — Final Consult Note (Signed)
..Matheus, Sporn [id number] [birth year] Marrion Coy, MD  Reason for Consult: dysphonia, dysphagia  HPI: 62 year old male admitted for CHF exacerbation who reported dysphonia and dysphagia since attempted TEE on 03/04/2023.  Patient patient he underwent attempted TEE and following that he could barely talk or swallow.  Since that time the swallowing pain has resolved and his voice has improved considerably.  ENT was consulted due to patient voicing symptoms.  Patient underwent barium swallow study a few minutes ago.  He denies any increased shortness of breath outside of his CHF symptoms.  Allergies:  Allergies  Allergen Reactions   Lisinopril     Other reaction(s): ANGIOEDEMA OF LIPS    ROS: Review of systems normal other than 12 systems except per HPI.  PMH:  Past Medical History:  Diagnosis Date   CHF (congestive heart failure) (HCC)    CKD (chronic kidney disease), stage II    Coronary artery disease, non-occlusive    HFrEF (heart failure with reduced ejection fraction) (HCC)    Hypertension    Hypertensive heart disease    NICM (nonischemic cardiomyopathy) (HCC)    Persistent atrial fibrillation (HCC)     FH:  Family History  Problem Relation Age of Onset   Hypertension Mother    Hypertension Father    CAD Brother    Diabetes Brother     SH:  Social History   Socioeconomic History   Marital status: Married    Spouse name: Not on file   Number of children: Not on file   Years of education: Not on file   Highest education level: Not on file  Occupational History   Not on file  Tobacco Use   Smoking status: Some Days    Current packs/day: 0.50    Types: Cigarettes   Smokeless tobacco: Never  Substance and Sexual Activity   Alcohol use: Yes    Alcohol/week: 7.0 - 14.0 standard drinks of alcohol    Types: 7 - 14 Cans of beer per week    Comment: daily   Drug use: Never   Sexual activity: Not on file  Other Topics Concern   Not on file  Social History  Narrative   Not on file   Social Determinants of Health   Financial Resource Strain: Not on file  Food Insecurity: No Food Insecurity (03/17/2023)   Hunger Vital Sign    Worried About Running Out of Food in the Last Year: Never true    Ran Out of Food in the Last Year: Never true  Transportation Needs: No Transportation Needs (03/17/2023)   PRAPARE - Administrator, Civil Service (Medical): No    Lack of Transportation (Non-Medical): No  Physical Activity: Not on file  Stress: Not on file  Social Connections: Not on file  Intimate Partner Violence: Not At Risk (03/17/2023)   Humiliation, Afraid, Rape, and Kick questionnaire    Fear of Current or Ex-Partner: No    Emotionally Abused: No    Physically Abused: No    Sexually Abused: No    PSH:  Past Surgical History:  Procedure Laterality Date   CARDIOVERSION N/A 03/04/2023   Procedure: CARDIOVERSION;  Surgeon: Dorthula Nettles, DO;  Location: [facility] ORS;  Service: Cardiovascular;  Laterality: N/A;   RIGHT/LEFT HEART CATH AND CORONARY ANGIOGRAPHY N/A 12/07/2019   Procedure: RIGHT/LEFT HEART CATH AND CORONARY ANGIOGRAPHY;  Surgeon: Antonieta Iba, MD;  Location: [facility] INVASIVE CV LAB;  Service: Cardiovascular;  Laterality: N/A;   TEE WITHOUT  CARDIOVERSION N/A 03/04/2023   Procedure: TRANSESOPHAGEAL ECHOCARDIOGRAM;  Surgeon: Dorthula Nettles, DO;  Location: [facility] ORS;  Service: Cardiovascular;  Laterality: N/A;    Physical  Exam:  GEN- NAD supine in bed NEURO- CN 2-12 grossly intact and symmetric. EARS- external ears clear NOSE- clear anteriorly RESP- no strider, no significant hoarseness or use of accessory muscles OC/OP- no abnormal masses or lesions, poor dentition NECK- supple with no LAD or thyromegaly  Transnasal flexible laryngoscopy-  patient declined laryngoscopy for visualization of his larynx  Barium Swallow 03/18/2023: NDICATION: Patient complains of upper esophagus pain x 1 week following attempted TEE study  on 7/25, he states his symptoms have now resolved and he is able to eat and swallow liquid and solids without pain. TEE study was unable to be performed on 7/25 secondary to inability to pass the endoscope into esophagus.  IMPRESSION: 1. No focal esophageal stricture is seen on today's limited single contrast esophagram study.     A/P: Dysphagia and dysphonia following aborted TEE on 03/04/2023 that patient reports his symptoms have now mostly resolved.  Denies breathing difficulty or pain with swallowing.  Normal barium swallow and patient declined bedside laryngoscopy.  Given resolved symptoms and normal swallow study, recommend further evaluation as outpatient if patient wishes.  No further need for ENT at this time and will sign off.   Roney Mans  03/18/2023 1:43 PM

## 2023-03-19 DIAGNOSIS — I160 Hypertensive urgency: Secondary | ICD-10-CM | POA: Diagnosis not present

## 2023-03-19 DIAGNOSIS — N1832 Chronic kidney disease, stage 3b: Secondary | ICD-10-CM | POA: Diagnosis not present

## 2023-03-19 DIAGNOSIS — I5023 Acute on chronic systolic (congestive) heart failure: Secondary | ICD-10-CM | POA: Diagnosis not present

## 2023-03-19 DIAGNOSIS — I4892 Unspecified atrial flutter: Secondary | ICD-10-CM | POA: Diagnosis not present

## 2023-03-19 MED ORDER — FUROSEMIDE 10 MG/ML IJ SOLN
20.0000 mg | Freq: Once | INTRAMUSCULAR | Status: AC
  Administered 2023-03-19: 20 mg via INTRAVENOUS
  Filled 2023-03-19: qty 2

## 2023-03-19 MED ORDER — MELATONIN 5 MG PO TABS
5.0000 mg | ORAL_TABLET | Freq: Every day | ORAL | Status: DC
  Administered 2023-03-19: 5 mg via ORAL
  Filled 2023-03-19: qty 1

## 2023-03-19 MED ORDER — FUROSEMIDE 10 MG/ML IJ SOLN
60.0000 mg | Freq: Two times a day (BID) | INTRAMUSCULAR | Status: DC
  Administered 2023-03-19 – 2023-03-20 (×2): 60 mg via INTRAVENOUS
  Filled 2023-03-19 (×2): qty 6

## 2023-03-19 NOTE — Progress Notes (Signed)
   Patient Name: Gregory Howell Date of Encounter: 03/19/2023 [facility] Cardiologist: Lorine Bears, MD   Interval Summary  .    Feeling better today without chest pain or shortness of breath.  Leg swelling has also gotten better.  Hoarseness and dysphagia are almost completely gone.  ENT evaluation yesterday was unremarkable with normal swallow study.  Gregory Howell does not wish to undergo direct laryngoscopy.  He notes some cramping and wonders if this could be due to diuretics.  Vital Signs .    Vitals:   03/19/23 0941 03/19/23 1100 03/19/23 1305 03/19/23 1606  BP: (!) 145/97  (!) 141/90 (!) 155/114  Pulse: 73  74 75  Resp: 20  18 19   Temp: (!) 97.4 F (36.3 C)  (!) 97.5 F (36.4 C) (!) 97.5 F (36.4 C)  TempSrc:      SpO2: 96%  97% 94%  Weight:  104.6 kg    Height:        Intake/Output Summary (Last 24 hours) at 03/19/2023 1836 Last data filed at 03/19/2023 1500 Gross per 24 hour  Intake 920 ml  Output 1150 ml  Net -230 ml      03/19/2023   11:00 AM 03/18/2023    7:17 AM 03/15/2023    9:49 PM  Last 3 Weights  Weight (lbs) 230 lb 11.2 oz 227 lb 1.2 oz 220 lb  Weight (kg) 104.645 kg 103 kg 99.791 kg      Telemetry/ECG    Atrial flutter with variable conduction, ventricular rates 70-90 bpm.- Personally Reviewed  Physical Exam .   GEN: No acute distress.   Neck: No JVD Cardiac: Irregularly irregular rhythm with 2/6 systolic murmur.  Marland Kitchen  Respiratory: Clear to auscultation bilaterally. GI: Soft, nontender, non-distended  MS: 1+ pretibial edema bilaterally  Assessment & Plan .     Acute on chronic HFrEF: Volume status gradually improving though based on his most recent discharge weight around 101 kilograms, Gregory Howell is still retaining some fluid.  Weight is actually up since yesterday, though query accuracy of some weights. -Increase furosemide to 60 mg IV twice daily. -Continue current doses of carvedilol, empagliflozin, and losartan. -Defer challenging with  aldosterone antagonist in the setting of CKD and inconsistent follow-up with risk for hyperkalemia. -Consider addition of BiDil, particularly if blood pressure remains elevated. -Reinvolve advanced heart failure team when they are back at [facility].  Persistent atrial flutter: Ventricular rates are reasonably well-controlled.  I agree that Gregory Howell would probably benefit from restoration of sinus rhythm.  Unfortunately, esophagus could not be intubated with TEE probe during his last visit.  He also complained of significant dysphagia and hoarseness after this procedure, though both seem to be improving.  We appreciate ENTs evaluation yesterday. -Continue anticoagulation with apixaban.  Consider cardioversion once Gregory Howell has completed at least 3 weeks of uninterrupted anticoagulation.  Given inability to intubate esophagus and subsequent discomfort, I would be very reluctant to reattempt TEE unless absolutely necessary. -Continue rate control with carvedilol.  Hypertension: Blood pressure still running high. -Continue current regimen of carvedilol and losartan. -Consider transitioning to [facility] tomorrow if renal function remains stable. -Consider addition of BiDil.  For questions or updates, please contact [facility] Please consult [email or web address] for contact info under [facility].     Signed, Yvonne Kendall, MD

## 2023-03-19 NOTE — Progress Notes (Signed)
Progress Note   Patient: Gregory Howell WUJ:[id number] DOB: [birth year] DOA: 03/16/2023     3 DOS: the patient was seen and examined on 03/19/2023   Brief hospital course: 62 y.o. male with medical history significant for Systolic CHF EF <20%02/2023,CKD 3b, CAD, hypertension and nonischemic cardiomyopathy as well as chronic fibrillation, recently hospitalized from 7/22 to 03/06/2023 with CHF exacerbation and rapid A-fib flutter treated with amiodarone infusion, as well as AKI who presents to the ED with a 2-day history of worsening shortness of breath.  Patient states since his discharge just over a week ago he has not taken any of his medications as he gets his meds via mail order from the [place] and it was sent to the wrong address and he could not secure a prescription.  He denies chest pain, cough, fever or chills. ED course and data review: BP 159/122 on arrival, tachypneic to 28 with O2 sat 96% on room air.  Labs significant for troponin 57-48 with BNP 1135 up from 1107 a couple weeks prior.  WBC 11.3.  Creatinine 1.74 which is around baseline for his CKD 3 EKG, personally viewed and interpreted showing A-fib flutter at [facility] shows with no ischemic ST-T wave changes. Chest X's cardiomegaly with central pulmonary vascular congestion Patient treated with IV Lasix Hospitalist consulted for admission.  Patient has been followed by cardiology.   Principal Problem:   Acute on chronic HFrEF (heart failure with reduced ejection fraction) (HCC) Active Problems:   Hypertensive urgency   Atrial flutter (HCC)   Stage 3b chronic kidney disease (HCC)   CAD (coronary artery disease)   Obesity (BMI 30-39.9)   Elevated bilirubin   Assessment and Plan: * Acute on chronic HFrEF (heart failure with reduced ejection fraction) (HCC) EF <20% 02/2023 Patient on Lasix 40 mg IV every 12 hours, Coreg twice a day, Cozaar and Jardiance. Discussed with cardiology, will continue 1 more day of IV Lasix, most likely  will discharge home tomorrow.   Dysphagia. Patient had a history of dysphagia, TEE was unsuccessful in cardiologist office because of this.  Barium esophagram did not show any esophageal stricture.    Hypertensive urgency Restarted home medicines.   Atrial flutter (HCC) Continue Coreg and Eliquis.  Heart rate much better controlled.   Stage 3b chronic kidney disease (HCC) Renal function is relatively stable.   CAD (coronary artery disease) Continue Coreg and Crestor.   Obesity (BMI 30-39.9) Diet and exercise advised.   Elevated bilirubin Right upper quadrant ultrasound done last month showed no evidence of cholecystitis, did show hepatomegaly.        Subjective:  Patient doing better, states that he cannot sleep at night.  Otherwise short of breath is better.  Physical Exam: Vitals:   03/19/23 0004 03/19/23 0308 03/19/23 0941 03/19/23 1100  BP: (!) 151/112 (!) 138/99 (!) 145/97   Pulse: 71 88 73   Resp: 18 20 20    Temp: (!) 97.1 F (36.2 C) 98.1 F (36.7 C) (!) 97.4 F (36.3 C)   TempSrc:      SpO2: 97% 98% 96%   Weight:    104.6 kg  Height:       General exam: Appears calm and comfortable  Respiratory system: Decreased breath sounds. Respiratory effort normal. Cardiovascular system: S1 & S2 heard, RRR. No JVD, murmurs, rubs, gallops or clicks. No pedal edema. Gastrointestinal system: Abdomen is nondistended, soft and nontender. No organomegaly or masses felt. Normal bowel sounds heard. Central nervous system: Alert and oriented.  No focal neurological deficits. Extremities: Symmetric 5 x 5 power. Skin: No rashes, lesions or ulcers Psychiatry: Judgement and insight appear normal. Mood & affect appropriate.    Data Reviewed:  Lab results reviewed.  Family Communication: None  Disposition: Status is: Inpatient Remains inpatient appropriate because: Severity of disease, IV treatment.     Time spent: 35 minutes  Author: Marrion Coy, MD 03/19/2023  12:00 PM  For on call review [email or web address].

## 2023-03-20 DIAGNOSIS — I10 Essential (primary) hypertension: Secondary | ICD-10-CM | POA: Diagnosis not present

## 2023-03-20 DIAGNOSIS — I483 Typical atrial flutter: Secondary | ICD-10-CM

## 2023-03-20 DIAGNOSIS — I160 Hypertensive urgency: Secondary | ICD-10-CM | POA: Diagnosis not present

## 2023-03-20 DIAGNOSIS — I4892 Unspecified atrial flutter: Secondary | ICD-10-CM | POA: Diagnosis not present

## 2023-03-20 DIAGNOSIS — I5023 Acute on chronic systolic (congestive) heart failure: Secondary | ICD-10-CM | POA: Diagnosis not present

## 2023-03-20 MED ORDER — METHOCARBAMOL 1000 MG/10ML IJ SOLN
500.0000 mg | Freq: Once | INTRAVENOUS | Status: AC
  Administered 2023-03-20: 500 mg via INTRAVENOUS
  Filled 2023-03-20: qty 5

## 2023-03-20 MED ORDER — FUROSEMIDE 40 MG PO TABS: 40.0000 mg | ORAL_TABLET | Freq: Two times a day (BID) | ORAL | Status: DC

## 2023-03-20 MED ORDER — ISOSORBIDE DINITRATE 20 MG PO TABS
20.0000 mg | ORAL_TABLET | Freq: Two times a day (BID) | ORAL | Status: DC
  Filled 2023-03-20: qty 1

## 2023-03-20 MED ORDER — FUROSEMIDE 40 MG PO TABS: 40.0000 mg | ORAL_TABLET | Freq: Two times a day (BID) | ORAL | 0 refills | Status: AC

## 2023-03-20 MED ORDER — HYDRALAZINE HCL 10 MG PO TABS
10.0000 mg | ORAL_TABLET | Freq: Two times a day (BID) | ORAL | Status: DC
  Administered 2023-03-20: 10 mg via ORAL
  Filled 2023-03-20: qty 1

## 2023-03-20 MED ORDER — HYDRALAZINE HCL 10 MG PO TABS: 10.0000 mg | ORAL_TABLET | Freq: Two times a day (BID) | ORAL | 0 refills | Status: AC

## 2023-03-20 MED ORDER — ISOSORBIDE DINITRATE 20 MG PO TABS: 20.0000 mg | ORAL_TABLET | Freq: Two times a day (BID) | ORAL | 0 refills | Status: AC

## 2023-03-20 NOTE — Progress Notes (Signed)
Rounding Note    Patient Name: Gregory Howell Date of Encounter: 03/20/2023  [facility] Cardiologist: Lorine Bears, MD   Subjective   No acute events overnight. Denies chest pain or shortness of breath. Wants to go home today. Needs 30 days of medication at discharge as he gets all of his medications through the [place] and hasn't received them yet.  Inpatient Medications    Scheduled Meds:  apixaban  5 mg Oral BID   carvedilol  12.5 mg Oral BID   empagliflozin  10 mg Oral Daily   furosemide  40 mg Oral BID   hydrALAZINE  10 mg Oral BID   isosorbide dinitrate  20 mg Oral BID   losartan  100 mg Oral Daily   melatonin  5 mg Oral QHS   rosuvastatin  20 mg Oral QPM   Continuous Infusions:  PRN Meds: acetaminophen **OR** acetaminophen, ondansetron **OR** ondansetron (ZOFRAN) IV, oxyCODONE-acetaminophen   Vital Signs    Vitals:   03/19/23 2005 03/20/23 0358 03/20/23 0545 03/20/23 0919  BP: (!) 138/97 (!) 143/107 (!) 141/107 (!) 152/87  Pulse: 80 71 80 75  Resp: 20 18  (!) 21  Temp: 97.7 F (36.5 C) 97.9 F (36.6 C)  (!) 97.3 F (36.3 C)  TempSrc: Oral     SpO2: 96% 99%  93%  Weight:      Height:        Intake/Output Summary (Last 24 hours) at 03/20/2023 1026 Last data filed at 03/20/2023 0900 Gross per 24 hour  Intake 1140 ml  Output 1960 ml  Net -820 ml      03/19/2023   11:00 AM 03/18/2023    7:17 AM 03/15/2023    9:49 PM  Last 3 Weights  Weight (lbs) 230 lb 11.2 oz 227 lb 1.2 oz 220 lb  Weight (kg) 104.645 kg 103 kg 99.791 kg      Telemetry    Atrial flutter in the 70s-80s - Personally Reviewed  ECG    No new - Personally Reviewed  Physical Exam   GEN: No acute distress.   Neck: No JVD Cardiac: RRR, no murmurs, rubs, or gallops.  Respiratory: Clear to auscultation bilaterally. GI: Soft, nontender, non-distended  MS: No edema; No deformity. Neuro:  Nonfocal  Psych: Normal affect   Labs    High Sensitivity Troponin:   Recent Labs   Lab 03/01/23 1627 03/01/23 1925 03/15/23 2154 03/16/23 0044  TROPONINIHS 36* 49* 57* 48*     Chemistry Recent Labs  Lab 03/15/23 2154 03/17/23 0411 03/18/23 0452 03/19/23 0513 03/20/23 0615  NA 137   < > 135 138 137  K 4.8   < > 3.5 3.9 4.1  CL 105   < > 103 99 96*  CO2 22   < > 26 28 30   GLUCOSE 102*   < > 101* 117* 95  BUN 21   < > 30* 29* 31*  CREATININE 1.74*   < > 2.04* 2.01* 2.04*  CALCIUM 8.5*   < > 7.9* 8.4* 8.5*  MG 2.1  --  2.3 2.2 2.7*  PROT 7.6  --   --   --   --   ALBUMIN 3.7  --   --   --   --   AST 36  --   --   --   --   ALT 29  --   --   --   --   ALKPHOS 151*  --   --   --   --  BILITOT 3.0*  --   --   --   --   GFRNONAA 44*   < > 36* 37* 36*  ANIONGAP 10   < > 6 11 11    < > = values in this interval not displayed.    Lipids No results for input(s): "CHOL", "TRIG", "HDL", "LABVLDL", "LDLCALC", "CHOLHDL" in the last 168 hours.  Hematology Recent Labs  Lab 03/15/23 2154 03/17/23 0411 03/18/23 0452  WBC 11.3* 10.6* 11.2*  RBC 5.13 4.85 4.91  HGB 16.0 15.1 15.4  HCT 45.7 41.9 42.9  MCV 89.1 86.4 87.4  MCH 31.2 31.1 31.4  MCHC 35.0 36.0 35.9  RDW 16.0* 15.6* 15.7*  PLT 229 202 210   Thyroid No results for input(s): "TSH", "FREET4" in the last 168 hours.  BNP Recent Labs  Lab 03/15/23 2154  BNP 1,134.5*    DDimer No results for input(s): "DDIMER" in the last 168 hours.   Radiology    DG ESOPHAGUS W SINGLE CM (SOL OR THIN BA)  Result Date: 03/18/2023 INDICATION: Patient complains of upper esophagus pain x 1 week following attempted TEE study on 7/25, he states his symptoms have now resolved and he is able to eat and swallow liquid and solids without pain. TEE study was unable to be performed on 7/25 secondary to inability to pass the endoscope into esophagus. EXAM: ESOPHAGUS/BARIUM SWALLOW/TABLET STUDY PROCEDURE: Study was tailored to order request. Single contrast examination was performed using Omnipaque 300 followed by thin barium.  FLUOROSCOPY TIME:  Radiation Exposure Index (as provided by the fluoroscopic device): 1.9 minute, 31.30 mGy COMPARISON:  NONE. FINDINGS: Single contrast examination performed with Omnipaque 300 of the esophagus and gastroesophageal junction followed by thin barium. Scout radiograph fluoro saved images were taken prior to contrast administration. In the upright AP, lateral and oblique positions the patient was administered contrast by mouth. Normal opacification of the esophagus. Contrast empties into the stomach without delay. No focal esophageal stricture is seen. COMPLICATIONS: NONE. IMPRESSION: 1. No focal esophageal stricture is seen on today's limited single contrast esophagram study. This exam was performed by Pattricia Boss PA-C, and was supervised and interpreted by Dr. Ramiro Harvest. Electronically Signed   By: Jules Schick M.D.   On: 03/18/2023 12:21    Assessment & Plan    Acute on chronic HFrEF: -I/O likely inaccurate (intake minimally recorded). Listed as net negative 6.6 L but weight yesterday actually increased and is above baseline weight -appears net negative 620 ml yesterday on furosemide 60 mg IV twice daily. Will return to oral lasix today -Cr remains elevated at 2.04, though peak of 2.19 this admission -Continue current doses of carvedilol, empagliflozin, and losartan. -Defer challenging with aldosterone antagonist in the setting of CKD and inconsistent follow-up with risk for hyperkalemia. Will defer change to entresto today for same reason, as per primary team patient planned for discharge today   Persistent atrial flutter: -currently rate controlled.  -could not be intubated with TEE probe, has dysphagia/hoarseness after. Would not reattempt, therefore will need to be on apixaban for at least 3 weeks unintterupted before cardioversion can be attempted. -Continue rate control with carvedilol.   Hypertension: -blood pressure remains elevated. Will start isordil/hydralazine  today -Continue current regimen of carvedilol and losartan.  [facility] will sign off.  Per primary team, patient planned for discharge today. Medication Recommendations:  as currently ordered. Of note, do not restart amiodarone as he was not taking this prior to admission and has not been anticoagulated for >  3 weeks Other recommendations (labs, testing, etc):  none Follow up as an outpatient:  He has an appt with Clarisa Kindred on 03/24/23  For questions or updates, please contact [facility] Please consult [email or web address] for contact info under        Signed, Jodelle Red, MD  03/20/2023, 10:26 AM

## 2023-03-20 NOTE — Progress Notes (Signed)
       CROSS COVER NOTE  NAME: Gregory Howell MRN: [id number] DOB : 02-14-1961    Concern as stated by nurse / staff     62 y.o. male with medical history significant for Systolic CHF EF <20%02/2023,CKD 3b, CAD, hypertension and nonischemic cardiomyopathy as well as chronic A-fibrillation, recent hospitalization d/t CHF exacerbation and rapid A-fib flutter treated with amiodarone infusion, as well as AKI. [facility] and [facility] without for 1 week, Pt being treated with lasix, diuresing well. Currently, pt c/o constant cramping in bilateral legs and neck. Pt report he has been awake "all night long, stressed out and unable to settled down." Pt also report he has been diagnosed with PTSD but is not on any medication regimen. Pt has been up in chair and ambulating in hallway. Tylenol given at 2253 and Percocet given @ 0156. Please advise. Thanks    Pertinent findings on chart review: reviewed  Assessment and  Interventions   Assessment:  Plan: Dose of robaxin for muscle cramping ordered which will hopefully allow him to rest Defer PTSD treatment/referral to primary team       Donnie Mesa NP [facility] Cross Cover 7pm-7am - check amion for availability Pager [phone]

## 2023-03-20 NOTE — Progress Notes (Signed)
Mobility Specialist - Progress Note   03/20/23 0918  Mobility  Activity Ambulated independently in hallway;Stood at bedside;Dangled on edge of bed  Level of Assistance Independent  Assistive Device Front wheel walker  Distance Ambulated (ft) 340 ft  Activity Response Tolerated well  Mobility Referral Yes  $Mobility charge 1 Mobility  Mobility Specialist Start Time (ACUTE ONLY) [id number]  Mobility Specialist Stop Time (ACUTE ONLY) 0849  Mobility Specialist Time Calculation (min) (ACUTE ONLY) 16 min   Pt supine in bed on RA upon arrival. Pt completes bed mobility, STS, and ambulates 2 laps around NS indep. Pt returns to bed with needs in reach.   Terrilyn Saver  Mobility Specialist  03/20/23 9:19 AM

## 2023-03-20 NOTE — Plan of Care (Signed)

## 2023-03-20 NOTE — Discharge Summary (Signed)
Physician Discharge Summary   Patient: Gregory Howell MRN: [id number] DOB: Jan 08, 1961  Admit date:     03/16/2023  Discharge date: 03/20/23  Discharge Physician: Marrion Coy   PCP: [facility]   Recommendations at discharge:   Follow-up with PCP in 1 week.  Discharge Diagnoses: Principal Problem:   Acute on chronic HFrEF (heart failure with reduced ejection fraction) (HCC) Active Problems:   Hypertensive urgency   Atrial flutter (HCC)   Stage 3b chronic kidney disease (HCC)   CAD (coronary artery disease)   Obesity (BMI 30-39.9)   Elevated bilirubin  Resolved Problems:   * No resolved hospital problems. [facility] Course: 62 y.o. male with medical history significant for Systolic CHF EF <20%02/2023,CKD 3b, CAD, hypertension and nonischemic cardiomyopathy as well as chronic fibrillation, recently hospitalized from 7/22 to 03/06/2023 with CHF exacerbation and rapid A-fib flutter treated with amiodarone infusion, as well as AKI who presents to the ED with a 2-day history of worsening shortness of breath.  Patient states since his discharge just over a week ago he has not taken any of his medications as he gets his meds via mail order from the [place] and it was sent to the wrong address and he could not secure a prescription.  He denies chest pain, cough, fever or chills. ED course and data review: BP 159/122 on arrival, tachypneic to 28 with O2 sat 96% on room air.  Labs significant for troponin 57-48 with BNP 1135 up from 1107 a couple weeks prior.  WBC 11.3.  Creatinine 1.74 which is around baseline for his CKD 3 EKG, personally viewed and interpreted showing A-fib flutter at [facility] shows with no ischemic ST-T wave changes. Chest X's cardiomegaly with central pulmonary vascular congestion Patient treated with IV Lasix Condition much improved, medically stable to be discharged.  Per patient, he already got his diuretics mailed to his house.  Assessment and Plan: * Acute on  chronic HFrEF (heart failure with reduced ejection fraction) (HCC) EF <20% 02/2023 Patient on Lasix 40 mg IV every 12 hours, Coreg twice a day, Cozaar and Jardiance. Patient is followed by cardiology, condition has improved.  Medically stable to be discharged.  Cardiology has added Imdur and hydralazine to the regimen.  Patient is advised to follow-up with the hospital in 1 week.   Dysphagia. Patient had a history of dysphagia, TEE was unsuccessful in cardiologist office because of this.  Barium esophagram did not show any esophageal stricture.    Hypertensive urgency Restarted home medicines.   Atrial flutter (HCC) Continue Coreg and Eliquis.  Heart rate much better controlled.   Stage 3b chronic kidney disease (HCC) Renal function is relatively stable.   CAD (coronary artery disease) Continue Coreg and Crestor.   Obesity (BMI 30-39.9) Diet and exercise advised.   Elevated bilirubin Right upper quadrant ultrasound done last month showed no evidence of cholecystitis, did show hepatomegaly.       Consultants: Cardiology Procedures performed: None  Disposition: Home Diet recommendation:  Discharge Diet Orders (From admission, onward)     Start     Ordered   03/20/23 0000  Diet - low sodium heart healthy        03/20/23 1025           Cardiac diet DISCHARGE MEDICATION: Allergies as of 03/20/2023       Reactions   Lisinopril    Other reaction(s): ANGIOEDEMA OF LIPS        Medication List  TAKE these medications    amiodarone 200 MG tablet Commonly known as: PACERONE Take 1 tablet (200 mg total) by mouth 2 (two) times daily. For 7 days then just take once daily   apixaban 5 MG Tabs tablet Commonly known as: ELIQUIS Take 1 tablet (5 mg total) by mouth 2 (two) times daily.   carvedilol 25 MG tablet Commonly known as: COREG Take 0.5 tablets (12.5 mg total) by mouth 2 (two) times daily.   empagliflozin 10 MG Tabs tablet Commonly known as:  JARDIANCE Take 10 mg by mouth daily.   furosemide 40 MG tablet Commonly known as: LASIX Take 40 mg by mouth 2 (two) times daily.   hydrALAZINE 10 MG tablet Commonly known as: APRESOLINE Take 1 tablet (10 mg total) by mouth 2 (two) times daily.   isosorbide dinitrate 20 MG tablet Commonly known as: ISORDIL Take 1 tablet (20 mg total) by mouth 2 (two) times daily.   losartan 50 MG tablet Commonly known as: COZAAR Take 1 tablet (50 mg total) by mouth daily.   rosuvastatin 40 MG tablet Commonly known as: CRESTOR Take 20 mg by mouth every evening.        Follow-up Information     [facility] Follow up in 1 week(s).   Specialty: General Practice Contact information: [address] [place] [address] [phone]                Discharge Exam: Ceasar Mons Weights   03/15/23 2149 03/18/23 0717 03/19/23 1100  Weight: 99.8 kg 103 kg 104.6 kg   General exam: Appears calm and comfortable  Respiratory system: Clear to auscultation. Respiratory effort normal. Cardiovascular system: Regular. No JVD, murmurs, rubs, gallops or clicks. No pedal edema. Gastrointestinal system: Abdomen is nondistended, soft and nontender. No organomegaly or masses felt. Normal bowel sounds heard. Central nervous system: Alert and oriented. No focal neurological deficits. Extremities: Symmetric 5 x 5 power. Skin: No rashes, lesions or ulcers Psychiatry: Judgement and insight appear normal. Mood & affect appropriate.    Condition at discharge: good  The results of significant diagnostics from this hospitalization (including imaging, microbiology, ancillary and laboratory) are listed below for reference.   Imaging Studies: DG ESOPHAGUS W SINGLE CM (SOL OR THIN BA)  Result Date: 03/18/2023 INDICATION: Patient complains of upper esophagus pain x 1 week following attempted TEE study on 7/25, he states his symptoms have now resolved and he is able to eat and swallow liquid and solids without  pain. TEE study was unable to be performed on 7/25 secondary to inability to pass the endoscope into esophagus. EXAM: ESOPHAGUS/BARIUM SWALLOW/TABLET STUDY PROCEDURE: Study was tailored to order request. Single contrast examination was performed using Omnipaque 300 followed by thin barium. FLUOROSCOPY TIME:  Radiation Exposure Index (as provided by the fluoroscopic device): 1.9 minute, 31.30 mGy COMPARISON:  NONE. FINDINGS: Single contrast examination performed with Omnipaque 300 of the esophagus and gastroesophageal junction followed by thin barium. Scout radiograph fluoro saved images were taken prior to contrast administration. In the upright AP, lateral and oblique positions the patient was administered contrast by mouth. Normal opacification of the esophagus. Contrast empties into the stomach without delay. No focal esophageal stricture is seen. COMPLICATIONS: NONE. IMPRESSION: 1. No focal esophageal stricture is seen on today's limited single contrast esophagram study. This exam was performed by Pattricia Boss PA-C, and was supervised and interpreted by Dr. Ramiro Harvest. Electronically Signed   By: Jules Schick M.D.   On: 03/18/2023 12:21   DG  Chest 1 View  Result Date: 03/15/2023 CLINICAL DATA:  Shortness of breath EXAM: CHEST  1 VIEW COMPARISON:  Chest x-ray 03/02/2023 FINDINGS: The heart is enlarged, unchanged. There central pulmonary vascular congestion. There is no focal lung consolidation, pleural effusion or pneumothorax. No acute fractures are seen. IMPRESSION: Cardiomegaly with central pulmonary vascular congestion. Electronically Signed   By: Darliss Cheney M.D.   On: 03/15/2023 22:27   ECHOCARDIOGRAM LIMITED  Result Date: 03/04/2023    ECHOCARDIOGRAM LIMITED REPORT   Patient Name:   Gregory Howell Date of Exam: 03/04/2023 Medical Rec #:  [id number]      Height:       69.0 in Accession #:    [id number]     Weight:       245.0 lb Date of Birth:  Jun 29, 1961     BSA:          2.252 m Patient Age:    61  years       BP:           149/119 mmHg Patient Gender: M              HR:           62 bpm. Exam Location:  [facility] Procedure: Limited Echo, Color Doppler, Cardiac Doppler and Strain Analysis Indications:     Cardiomyopathy-unspecified I42.9  History:         Patient has prior history of Echocardiogram examinations, most                  recent 02/28/2021. CHF; Risk Factors:Hypertension. NICM.  Sonographer:     Cristela Blue Referring Phys:  [id number] Dorthula Nettles Diagnosing Phys: Julien Nordmann MD  Sonographer Comments: Global longitudinal strain was attempted. IMPRESSIONS  1. Left ventricular ejection fraction, by estimation, is <20%. Left ventricular ejection fraction by PLAX is 15 %. The left ventricle has severely decreased function. The left ventricle demonstrates global hypokinesis. The left ventricular internal cavity size was moderately dilated. The average left ventricular global longitudinal strain is -3.0 %. The global longitudinal strain is abnormal.  2. Right ventricular systolic function is moderately reduced. The right ventricular size is normal. There is mildly elevated pulmonary artery systolic pressure. The estimated right ventricular systolic pressure is 42.2 mmHg.  3. Left atrial size was mildly dilated.  4. Right atrial size was moderately dilated.  5. The mitral valve is normal in structure. Mild to moderate mitral valve regurgitation. No evidence of mitral stenosis.  6. The aortic valve has an indeterminant number of cusps. Aortic valve regurgitation is not visualized. No aortic stenosis is present.  7. The inferior vena cava is normal in size with greater than 50% respiratory variability, suggesting right atrial pressure of 3 mmHg. FINDINGS  Left Ventricle: Left ventricular ejection fraction, by estimation, is <20%. Left ventricular ejection fraction by PLAX is 15 %. The left ventricle has severely decreased function. The left ventricle demonstrates global hypokinesis. The average left  ventricular global longitudinal strain is -3.0 %. The global longitudinal strain is abnormal. The left ventricular internal cavity size was moderately dilated. There is no left ventricular hypertrophy. Right Ventricle: The right ventricular size is normal. No increase in right ventricular wall thickness. Right ventricular systolic function is moderately reduced. There is mildly elevated pulmonary artery systolic pressure. The tricuspid regurgitant velocity is 3.05 m/s, and with an assumed right atrial pressure of 5 mmHg, the estimated right ventricular systolic pressure is 42.2 mmHg. Left Atrium: Left atrial size was  mildly dilated. Right Atrium: Right atrial size was moderately dilated. Pericardium: There is no evidence of pericardial effusion. Mitral Valve: The mitral valve is normal in structure. There is mild calcification of the mitral valve leaflet(s). Mild to moderate mitral valve regurgitation. No evidence of mitral valve stenosis. Tricuspid Valve: The tricuspid valve is normal in structure. Tricuspid valve regurgitation is mild . No evidence of tricuspid stenosis. Aortic Valve: The aortic valve has an indeterminant number of cusps. Aortic valve regurgitation is not visualized. No aortic stenosis is present. Pulmonic Valve: The pulmonic valve was normal in structure. Pulmonic valve regurgitation is not visualized. No evidence of pulmonic stenosis. Aorta: The aortic root is normal in size and structure. Venous: The inferior vena cava is normal in size with greater than 50% respiratory variability, suggesting right atrial pressure of 3 mmHg. IAS/Shunts: No atrial level shunt detected by color flow Doppler. Additional Comments: Spectral Doppler performed. Color Doppler performed.  LEFT VENTRICLE PLAX 2D LV EF:         Left                ventricular     2D                ejection        Longitudinal                fraction by     Strain                PLAX is 15      2D Strain GLS  -3.0 %                %.               Avg: LVIDd:         6.00 cm LVIDs:         5.60 cm LV PW:         1.00 cm LV IVS:        1.30 cm LVOT diam:     2.20 cm LVOT Area:     3.80 cm  LV Volumes (MOD) LV vol d, MOD    156.0 ml A2C: LV vol d, MOD    157.0 ml A4C: LV vol s, MOD    144.0 ml A2C: LV vol s, MOD    137.0 ml A4C: LV SV MOD A2C:   12.0 ml LV SV MOD A4C:   157.0 ml LV SV MOD BP:    20.9 ml LEFT ATRIUM             Index        RIGHT ATRIUM           Index LA diam:        5.30 cm 2.35 cm/m   RA Area:     29.10 cm LA Vol (A2C):   71.0 ml 31.53 ml/m  RA Volume:   113.00 ml 50.18 ml/m LA Vol (A4C):   67.7 ml 30.06 ml/m LA Biplane Vol: 70.9 ml 31.48 ml/m   AORTA Ao Root diam: 3.40 cm TRICUSPID VALVE TR Peak grad:   37.2 mmHg TR Vmax:        305.00 cm/s  SHUNTS Systemic Diam: 2.20 cm Julien Nordmann MD Electronically signed by Julien Nordmann MD Signature Date/Time: 03/04/2023/8:08:28 PM    Final    US Abdomen Limited RUQ (LIVER/GB)  Result Date: 03/02/2023 CLINICAL DATA:  Elevated liver function tests. EXAM: ULTRASOUND ABDOMEN LIMITED RIGHT  UPPER QUADRANT COMPARISON:  April 04, 2020 FINDINGS: Gallbladder: The gallbladder is markedly contracted and subsequently limited in evaluation. No gallstones are visualized. The gallbladder wall measures 5.6 mm in thickness. No sonographic Murphy sign noted by sonographer. Common bile duct: Diameter: 2.4 mm Liver: The liver is enlarged and measures 20.67 cm in length. No focal lesion identified. Within normal limits in parenchymal echogenicity. Portal vein is patent on color Doppler imaging with normal direction of blood flow towards the liver. Other: No ascites is visualized within the left upper quadrant, left lower quadrant, right lower quadrant or right upper quadrant. IMPRESSION: 1. Limited evaluation of the gallbladder, without evidence of cholelithiasis. 2. Hepatomegaly without focal liver lesions. 3. No ascites. Electronically Signed   By: Aram Candela M.D.   On: 03/02/2023 15:49   DG  Chest Port 1 View  Result Date: 03/02/2023 CLINICAL DATA:  Shortness of breath, chest pain EXAM: PORTABLE CHEST 1 VIEW COMPARISON:  03/01/2023 FINDINGS: Mild cardiomegaly. No confluent airspace opacities, effusions or edema. No acute bony abnormality. IMPRESSION: Cardiomegaly.  No active disease. Electronically Signed   By: Charlett Nose M.D.   On: 03/02/2023 02:49   DG Chest 2 View  Result Date: 03/01/2023 CLINICAL DATA:  Chest pain EXAM: CHEST - 2 VIEW COMPARISON:  08/17/2022 FINDINGS: Cardiac shadow is mildly enlarged but stable. Lungs are well aerated bilaterally. No focal infiltrate or effusion is seen. Mild central vascular congestion is again noted. No significant edema is seen. No bony abnormality is noted. IMPRESSION: Mild central vascular congestion without acute parenchymal abnormality. Electronically Signed   By: Alcide Clever M.D.   On: 03/01/2023 17:12    Microbiology: Results for orders placed or performed during the hospital encounter of 08/17/22  Resp panel by RT-PCR (RSV, Flu A&B, Covid) Anterior Nasal Swab     Status: None   Collection Time: 08/17/22  4:04 PM   Specimen: Anterior Nasal Swab  Result Value Ref Range Status   SARS Coronavirus 2 by RT PCR NEGATIVE NEGATIVE Final    Comment: (NOTE) SARS-CoV-2 target nucleic acids are NOT DETECTED.  The SARS-CoV-2 RNA is generally detectable in upper respiratory specimens during the acute phase of infection. The lowest concentration of SARS-CoV-2 viral copies this assay can detect is 138 copies/mL. A negative result does not preclude SARS-Cov-2 infection and should not be used as the sole basis for treatment or other patient management decisions. A negative result may occur with  improper specimen collection/handling, submission of specimen other than nasopharyngeal swab, presence of viral mutation(s) within the areas targeted by this assay, and inadequate number of viral copies(<138 copies/mL). A negative result must be  combined with clinical observations, patient history, and epidemiological information. The expected result is Negative.  Fact Sheet for Patients:  BloggerCourse.com  Fact Sheet for Healthcare Providers:  SeriousBroker.it  This test is no t yet approved or cleared by the [place] FDA and  has been authorized for detection and/or diagnosis of SARS-CoV-2 by FDA under an Emergency Use Authorization (EUA). This EUA will remain  in effect (meaning this test can be used) for the duration of the COVID-19 declaration under Section 564(b)(1) of the Act, 21 U.S.C.section 360bbb-3(b)(1), unless the authorization is terminated  or revoked sooner.       Influenza A by PCR NEGATIVE NEGATIVE Final   Influenza B by PCR NEGATIVE NEGATIVE Final    Comment: (NOTE) The Xpert Xpress SARS-CoV-2/FLU/RSV plus assay is intended as an aid in the diagnosis of influenza from Nasopharyngeal  swab specimens and should not be used as a sole basis for treatment. Nasal washings and aspirates are unacceptable for Xpert Xpress SARS-CoV-2/FLU/RSV testing.  Fact Sheet for Patients: BloggerCourse.com  Fact Sheet for Healthcare Providers: SeriousBroker.it  This test is not yet approved or cleared by the [place] FDA and has been authorized for detection and/or diagnosis of SARS-CoV-2 by FDA under an Emergency Use Authorization (EUA). This EUA will remain in effect (meaning this test can be used) for the duration of the COVID-19 declaration under Section 564(b)(1) of the Act, 21 U.S.C. section 360bbb-3(b)(1), unless the authorization is terminated or revoked.     Resp Syncytial Virus by PCR NEGATIVE NEGATIVE Final    Comment: (NOTE) Fact Sheet for Patients: BloggerCourse.com  Fact Sheet for Healthcare Providers: SeriousBroker.it  This test is not yet  approved or cleared by the [place] FDA and has been authorized for detection and/or diagnosis of SARS-CoV-2 by FDA under an Emergency Use Authorization (EUA). This EUA will remain in effect (meaning this test can be used) for the duration of the COVID-19 declaration under Section 564(b)(1) of the Act, 21 U.S.C. section 360bbb-3(b)(1), unless the authorization is terminated or revoked.  Performed at [facility], [address] [place], [place] [address]     Labs: CBC: Recent Labs  Lab 03/15/23 2154 03/17/23 0411 03/18/23 0452  WBC 11.3* 10.6* 11.2*  HGB 16.0 15.1 15.4  HCT 45.7 41.9 42.9  MCV 89.1 86.4 87.4  PLT 229 202 210   Basic Metabolic Panel: Recent Labs  Lab 03/15/23 2154 03/17/23 0411 03/18/23 0452 03/19/23 0513 03/20/23 0615  NA 137 139 135 138 137  K 4.8 3.6 3.5 3.9 4.1  CL 105 101 103 99 96*  CO2 22 28 26 28 30   GLUCOSE 102* 128* 101* 117* 95  BUN 21 27* 30* 29* 31*  CREATININE 1.74* 2.02* 2.04* 2.01* 2.04*  CALCIUM 8.5* 8.4* 7.9* 8.4* 8.5*  MG 2.1  --  2.3 2.2 2.7*   Liver Function Tests: Recent Labs  Lab 03/15/23 2154  AST 36  ALT 29  ALKPHOS 151*  BILITOT 3.0*  PROT 7.6  ALBUMIN 3.7   CBG: No results for input(s): "GLUCAP" in the last 168 hours.  Discharge time spent: greater than 30 minutes.  Signed: Marrion Coy, MD [facility] 03/20/2023

## 2023-03-24 ENCOUNTER — Telehealth: Payer: Self-pay | Admitting: Family

## 2023-03-24 ENCOUNTER — Encounter: Payer: No Typology Code available for payment source | Admitting: Family

## 2023-03-24 NOTE — Progress Notes (Deleted)
PCP: Primary Cardiologist:  HPI:  Mr Gregory Howell is a 62 y/o male with a history of CAD, HTN, CKD, persistent atrial fibrillation, current tobacco use and chronic heart failure.   Echo report from 04/05/20 reviewed and showed an EF of 40-45% along with moderate LVH, severe LAE, severe RAE and mildly elevated PA pressure.   RHC/LHC done 12/07/19 showed: 2nd Mrg lesion is 100% stenosed. Dist Cx lesion is 60% stenosed. 1st Mrg lesion is 40% stenosed. Ost RCA lesion is 50% stenosed. RPAV lesion is 60% stenosed. Mid RCA lesion is 30% stenosed. LV end diastolic pressure is mildly elevated. There is severe left ventricular systolic dysfunction. The left ventricular ejection fraction is less than 25% by visual estimate. Hemodynamic findings consistent with mild pulmonary hypertension.  Was in the ED 07/11/20 due to shortness of breath after gaining 8 pounds over previous 48 hours. Had run out of his lasix and has difficulty in getting it. IV lasix given and he was released. Admitted 06/19/20 due to acute on chronic HF. Cardiology consult obtained. Initially given IV lasix with transition to oral diuretics. Elevated troponin thought to be due to demand ischemia. Discharged after 4 days.   He presents today for his initial visit with a chief complaint of moderate fatigue with little exertion. He describes this as having been present for a few months. He has associated shortness of breath, palpitations, back pain, difficulty sleeping and high stress along with this. He denies any dizziness, abdominal distention, pedal edema, chest pain, cough or weight gain.   Says that he's under quite a bit of marital stress and is looking to find his own place to live. Recognizes that stress isn't good for his heart.      ROS: All systems negative except as listed in HPI, PMH and Problem List.  SH:  Social History   Socioeconomic History   Marital status: Married    Spouse name: Not on file   Number of children:  Not on file   Years of education: Not on file   Highest education level: Not on file  Occupational History   Not on file  Tobacco Use   Smoking status: Some Days    Current packs/day: 0.50    Types: Cigarettes   Smokeless tobacco: Never  Substance and Sexual Activity   Alcohol use: Yes    Alcohol/week: 7.0 - 14.0 standard drinks of alcohol    Types: 7 - 14 Cans of beer per week    Comment: daily   Drug use: Never   Sexual activity: Not on file  Other Topics Concern   Not on file  Social History Narrative   Not on file   Social Determinants of Health   Financial Resource Strain: Not on file  Food Insecurity: No Food Insecurity (03/17/2023)   Hunger Vital Sign    Worried About Running Out of Food in the Last Year: Never true    Ran Out of Food in the Last Year: Never true  Transportation Needs: No Transportation Needs (03/17/2023)   PRAPARE - Administrator, Civil Service (Medical): No    Lack of Transportation (Non-Medical): No  Physical Activity: Not on file  Stress: Not on file  Social Connections: Not on file  Intimate Partner Violence: Not At Risk (03/17/2023)   Humiliation, Afraid, Rape, and Kick questionnaire    Fear of Current or Ex-Partner: No    Emotionally Abused: No    Physically Abused: No    Sexually Abused: No  FH:  Family History  Problem Relation Age of Onset   Hypertension Mother    Hypertension Father    CAD Brother    Diabetes Brother     Past Medical History:  Diagnosis Date   CHF (congestive heart failure) (HCC)    CKD (chronic kidney disease), stage II    Coronary artery disease, non-occlusive    HFrEF (heart failure with reduced ejection fraction) (HCC)    Hypertension    Hypertensive heart disease    NICM (nonischemic cardiomyopathy) (HCC)    Persistent atrial fibrillation (HCC)     Current Outpatient Medications  Medication Sig Dispense Refill   amiodarone (PACERONE) 200 MG tablet Take 1 tablet (200 mg total) by mouth 2  (two) times daily. For 7 days then just take once daily 60 tablet 0   apixaban (ELIQUIS) 5 MG TABS tablet Take 1 tablet (5 mg total) by mouth 2 (two) times daily. 60 tablet 0   carvedilol (COREG) 25 MG tablet Take 0.5 tablets (12.5 mg total) by mouth 2 (two) times daily. 30 tablet 1   empagliflozin (JARDIANCE) 10 MG TABS tablet Take 10 mg by mouth daily.     furosemide (LASIX) 40 MG tablet Take 1 tablet (40 mg total) by mouth 2 (two) times daily. 60 tablet 0   hydrALAZINE (APRESOLINE) 10 MG tablet Take 1 tablet (10 mg total) by mouth 2 (two) times daily. 30 tablet 0   isosorbide dinitrate (ISORDIL) 20 MG tablet Take 1 tablet (20 mg total) by mouth 2 (two) times daily. 60 tablet 0   losartan (COZAAR) 50 MG tablet Take 1 tablet (50 mg total) by mouth daily. 30 tablet 0   rosuvastatin (CRESTOR) 40 MG tablet Take 20 mg by mouth every evening.     No current facility-administered medications for this visit.    There were no vitals filed for this visit.  PHYSICAL EXAM:  General:  Well appearing. No resp difficulty HEENT: normal Neck: supple. JVP flat. Carotids 2+ bilaterally; no bruits. No lymphadenopathy or thryomegaly appreciated. Cor: PMI normal. Regular rate & rhythm. No rubs, gallops or murmurs. Lungs: clear Abdomen: soft, nontender, nondistended. No hepatosplenomegaly. No bruits or masses. Good bowel sounds. Extremities: no cyanosis, clubbing, rash, edema Neuro: alert & orientedx3, cranial nerves grossly intact. Moves all 4 extremities w/o difficulty. Affect pleasant.   ECG:   ASSESSMENT & PLAN:  : Chronic heart failure with reduced ejection fraction- - NYHA class III - euvolemic today - weighing daily and says that his weight has been stable; reminded to call for an overnight weight gain of > 2 pounds or a weekly weight gain of > 5 pounds - not adding salt and has been trying to read food labels for sodium content; written dietary information given to him about a 2000mg  sodium  diet - consider changing his losartan to entresto in the future/ currently getting RX through the [place]; consider also adding farxiga and or spironolactone - [facility] cardiology saw patient during recent admission; f/u was scheduled for 08/15/20 - BNP 07/11/20 was 1217.1 - reports receiving his flu vaccine and 2 covid vaccines  2: HTN- - BP looks good today - sees PCP at the [place] and returns 08/13/20 - BMP 07/12/20 reviewed and showed sodium 142, potassium 3.5, creatinine 1.47 and GFR 54  3: Atrial fibrillation- - currently on apixaban BID - notices palpitations worsen when he's under stressful marital situation - smoking 2-3 cigarettes daily; complete cessation encouraged   Patient did not bring his medications  nor a list. Each medication was verbally reviewed with the patient and he was encouraged to bring the bottles to every visit to confirm accuracy of list.  Return in 2 months or sooner for any questions/problems before then.

## 2023-03-24 NOTE — Telephone Encounter (Signed)
Patient did not show for his [facility] appointment on 03/24/23. This is the 7th appt missed.

## 2024-01-16 ENCOUNTER — Emergency Department
Admission: EM | Admit: 2024-01-16 | Discharge: 2024-01-16 | Disposition: A | Discharge status: Home / Self Care | Attending: Emergency Medicine | Admitting: Emergency Medicine

## 2024-01-16 ENCOUNTER — Emergency Department

## 2024-01-16 ENCOUNTER — Other Ambulatory Visit: Payer: Self-pay

## 2024-01-16 DIAGNOSIS — I130 Hypertensive heart and chronic kidney disease with heart failure and stage 1 through stage 4 chronic kidney disease, or unspecified chronic kidney disease: Secondary | ICD-10-CM | POA: Insufficient documentation

## 2024-01-16 DIAGNOSIS — R079 Chest pain, unspecified: Secondary | ICD-10-CM | POA: Diagnosis not present

## 2024-01-16 DIAGNOSIS — S0081XA Abrasion of other part of head, initial encounter: Secondary | ICD-10-CM | POA: Insufficient documentation

## 2024-01-16 DIAGNOSIS — Y908 Blood alcohol level of 240 mg/100 ml or more: Secondary | ICD-10-CM | POA: Insufficient documentation

## 2024-01-16 DIAGNOSIS — Z79899 Other long term (current) drug therapy: Secondary | ICD-10-CM | POA: Diagnosis not present

## 2024-01-16 DIAGNOSIS — I5020 Unspecified systolic (congestive) heart failure: Secondary | ICD-10-CM | POA: Diagnosis not present

## 2024-01-16 DIAGNOSIS — N182 Chronic kidney disease, stage 2 (mild): Secondary | ICD-10-CM | POA: Insufficient documentation

## 2024-01-16 DIAGNOSIS — I2510 Atherosclerotic heart disease of native coronary artery without angina pectoris: Secondary | ICD-10-CM | POA: Diagnosis not present

## 2024-01-16 DIAGNOSIS — S0990XA Unspecified injury of head, initial encounter: Secondary | ICD-10-CM | POA: Diagnosis not present

## 2024-01-16 DIAGNOSIS — S0992XA Unspecified injury of nose, initial encounter: Secondary | ICD-10-CM | POA: Diagnosis present

## 2024-01-16 DIAGNOSIS — W19XXXA Unspecified fall, initial encounter: Secondary | ICD-10-CM | POA: Diagnosis not present

## 2024-01-16 DIAGNOSIS — F10920 Alcohol use, unspecified with intoxication, uncomplicated: Secondary | ICD-10-CM | POA: Diagnosis not present

## 2024-01-16 DIAGNOSIS — S022XXA Fracture of nasal bones, initial encounter for closed fracture: Secondary | ICD-10-CM | POA: Insufficient documentation

## 2024-01-16 DIAGNOSIS — Z7901 Long term (current) use of anticoagulants: Secondary | ICD-10-CM | POA: Diagnosis not present

## 2024-01-16 DIAGNOSIS — Z955 Presence of coronary angioplasty implant and graft: Secondary | ICD-10-CM | POA: Diagnosis not present

## 2024-01-16 DIAGNOSIS — R1032 Left lower quadrant pain: Secondary | ICD-10-CM | POA: Diagnosis not present

## 2024-01-16 LAB — URINALYSIS, ROUTINE W REFLEX MICROSCOPIC
Bacteria, UA: NONE SEEN
Bilirubin Urine: NEGATIVE
Glucose, UA: NEGATIVE mg/dL
Ketones, ur: NEGATIVE mg/dL
Leukocytes,Ua: NEGATIVE
Nitrite: NEGATIVE
Protein, ur: NEGATIVE mg/dL
RBC / HPF: 0 RBC/hpf (ref 0–5)
Specific Gravity, Urine: 1.003 — ABNORMAL LOW (ref 1.005–1.030)
Squamous Epithelial / HPF: 0 /HPF (ref 0–5)
WBC, UA: 0 WBC/hpf (ref 0–5)
pH: 6 (ref 5.0–8.0)

## 2024-01-16 LAB — ETHANOL: Alcohol, Ethyl (B): 282 mg/dL — ABNORMAL HIGH (ref <15)

## 2024-01-16 LAB — CBC WITH DIFFERENTIAL/PLATELET
Abs Immature Granulocytes: 0.04 10*3/uL (ref 0.00–0.07)
Basophils Absolute: 0.1 10*3/uL (ref 0.0–0.1)
Basophils Relative: 1 %
Eosinophils Absolute: 0.1 10*3/uL (ref 0.0–0.5)
Eosinophils Relative: 1 %
HCT: 46.2 % (ref 39.0–52.0)
Hemoglobin: 16.6 g/dL (ref 13.0–17.0)
Immature Granulocytes: 0 %
Lymphocytes Relative: 21 %
Lymphs Abs: 2.1 10*3/uL (ref 0.7–4.0)
MCH: 29.4 pg (ref 26.0–34.0)
MCHC: 35.9 g/dL (ref 30.0–36.0)
MCV: 81.8 fL (ref 80.0–100.0)
Monocytes Absolute: 0.8 10*3/uL (ref 0.1–1.0)
Monocytes Relative: 8 %
Neutro Abs: 6.7 10*3/uL (ref 1.7–7.7)
Neutrophils Relative %: 69 %
Platelets: 257 10*3/uL (ref 150–400)
RBC: 5.65 MIL/uL (ref 4.22–5.81)
RDW: 16.5 % — ABNORMAL HIGH (ref 11.5–15.5)
WBC: 9.8 10*3/uL (ref 4.0–10.5)
nRBC: 0 % (ref 0.0–0.2)

## 2024-01-16 LAB — COMPREHENSIVE METABOLIC PANEL WITH GFR
ALT: 40 U/L (ref 0–44)
AST: 42 U/L — ABNORMAL HIGH (ref 15–41)
Albumin: 3.9 g/dL (ref 3.5–5.0)
Alkaline Phosphatase: 127 U/L — ABNORMAL HIGH (ref 38–126)
Anion gap: 12 (ref 5–15)
BUN: 19 mg/dL (ref 8–23)
CO2: 26 mmol/L (ref 22–32)
Calcium: 8.6 mg/dL — ABNORMAL LOW (ref 8.9–10.3)
Chloride: 95 mmol/L — ABNORMAL LOW (ref 98–111)
Creatinine, Ser: 1.67 mg/dL — ABNORMAL HIGH (ref 0.61–1.24)
GFR, Estimated: 45 mL/min — ABNORMAL LOW (ref >60)
Glucose, Bld: 92 mg/dL (ref 70–99)
Potassium: 3.4 mmol/L — ABNORMAL LOW (ref 3.5–5.1)
Sodium: 133 mmol/L — ABNORMAL LOW (ref 135–145)
Total Bilirubin: 0.8 mg/dL (ref 0.0–1.2)
Total Protein: 8.3 g/dL — ABNORMAL HIGH (ref 6.5–8.1)

## 2024-01-16 LAB — URINE DRUG SCREEN, QUALITATIVE (ARMC ONLY)
Amphetamines, Ur Screen: NOT DETECTED
Barbiturates, Ur Screen: NOT DETECTED
Benzodiazepine, Ur Scrn: NOT DETECTED
Cannabinoid 50 Ng, Ur ~~LOC~~: POSITIVE — AB
Cocaine Metabolite,Ur ~~LOC~~: NOT DETECTED
MDMA (Ecstasy)Ur Screen: NOT DETECTED
Methadone Scn, Ur: NOT DETECTED
Opiate, Ur Screen: NOT DETECTED
Phencyclidine (PCP) Ur S: NOT DETECTED
Tricyclic, Ur Screen: NOT DETECTED

## 2024-01-16 LAB — MAGNESIUM: Magnesium: 2.3 mg/dL (ref 1.7–2.4)

## 2024-01-16 LAB — TROPONIN I (HIGH SENSITIVITY)
Troponin I (High Sensitivity): 31 ng/L — ABNORMAL HIGH (ref <18)
Troponin I (High Sensitivity): 35 ng/L — ABNORMAL HIGH (ref <18)

## 2024-01-16 LAB — LIPASE, BLOOD: Lipase: 32 U/L (ref 11–51)

## 2024-01-16 MED ORDER — LIDOCAINE-PRILOCAINE 2.5-2.5 % EX CREA
TOPICAL_CREAM | Freq: Once | CUTANEOUS | Status: AC
  Administered 2024-01-16: 1 via TOPICAL
  Filled 2024-01-16: qty 5

## 2024-01-16 MED ORDER — SODIUM CHLORIDE 0.9 % IV BOLUS (SEPSIS)
1000.0000 mL | Freq: Once | INTRAVENOUS | Status: AC
  Administered 2024-01-16: 1000 mL via INTRAVENOUS

## 2024-01-16 MED ORDER — POTASSIUM CHLORIDE CRYS ER 20 MEQ PO TBCR
40.0000 meq | EXTENDED_RELEASE_TABLET | Freq: Once | ORAL | Status: AC
  Administered 2024-01-16: 40 meq via ORAL
  Filled 2024-01-16: qty 2

## 2024-01-16 MED ORDER — IOHEXOL 300 MG/ML  SOLN
80.0000 mL | Freq: Once | INTRAMUSCULAR | Status: AC | PRN
Start: 2024-01-16 — End: 2024-01-16
  Administered 2024-01-16: 80 mL via INTRAVENOUS

## 2024-01-16 MED ORDER — BACITRACIN ZINC 500 UNIT/GM EX OINT
TOPICAL_OINTMENT | Freq: Once | CUTANEOUS | Status: AC
  Administered 2024-01-16: 1 via TOPICAL
  Filled 2024-01-16: qty 1.8

## 2024-01-16 NOTE — ED Provider Notes (Signed)
 [facility] Provider Note    Event Date/Time   First MD Initiated Contact with Patient 01/16/24 0157     (approximate)   History   Fall   HPI  Gregory Howell is a 63 y.o. male with history of CHF, hypertension, atrial fibrillation on Eliquis , chronic kidney disease who presents to the emergency department after a fall.  Patient is intoxicated.  He has multiple abrasions to the face.  Unclear if there was loss of consciousness.  Complains of chest pain, left lower abdominal pain, facial pain.  States he is up-to-date on his tetanus vaccine.   History provided by patient, EMS.    Past Medical History:  Diagnosis Date   CHF (congestive heart failure) (HCC)    CKD (chronic kidney disease), stage II    Coronary artery disease, non-occlusive    HFrEF (heart failure with reduced ejection fraction) (HCC)    Hypertension    Hypertensive heart disease    NICM (nonischemic cardiomyopathy) (HCC)    Persistent atrial fibrillation (HCC)     Past Surgical History:  Procedure Laterality Date   CARDIOVERSION N/A 03/04/2023   Procedure: CARDIOVERSION;  Surgeon: Alwin Baars, DO;  Location: [facility] ORS;  Service: Cardiovascular;  Laterality: N/A;   RIGHT/LEFT HEART CATH AND CORONARY ANGIOGRAPHY N/A 12/07/2019   Procedure: RIGHT/LEFT HEART CATH AND CORONARY ANGIOGRAPHY;  Surgeon: Devorah Fonder, MD;  Location: [facility] INVASIVE CV LAB;  Service: Cardiovascular;  Laterality: N/A;   TEE WITHOUT CARDIOVERSION N/A 03/04/2023   Procedure: TRANSESOPHAGEAL ECHOCARDIOGRAM;  Surgeon: Alwin Baars, DO;  Location: [facility] ORS;  Service: Cardiovascular;  Laterality: N/A;    MEDICATIONS:  Prior to Admission medications   Medication Sig Start Date End Date Taking? Authorizing Provider  amiodarone  (PACERONE ) 200 MG tablet Take 1 tablet (200 mg total) by mouth 2 (two) times daily. For 7 days then just take once daily 03/06/23   Amin, Sumayya, MD  apixaban  (ELIQUIS ) 5 MG TABS  tablet Take 1 tablet (5 mg total) by mouth 2 (two) times daily. 08/17/22   Viviano Ground, MD  carvedilol  (COREG ) 25 MG tablet Take 0.5 tablets (12.5 mg total) by mouth 2 (two) times daily. 03/06/23   Luna Salinas, MD  empagliflozin  (JARDIANCE ) 10 MG TABS tablet Take 10 mg by mouth daily.    [provider]  furosemide  (LASIX ) 40 MG tablet Take 1 tablet (40 mg total) by mouth 2 (two) times daily. 03/20/23   Donaciano Frizzle, MD  hydrALAZINE  (APRESOLINE ) 10 MG tablet Take 1 tablet (10 mg total) by mouth 2 (two) times daily. 03/20/23   Donaciano Frizzle, MD  isosorbide  dinitrate (ISORDIL ) 20 MG tablet Take 1 tablet (20 mg total) by mouth 2 (two) times daily. 03/20/23   Donaciano Frizzle, MD  losartan  (COZAAR ) 50 MG tablet Take 1 tablet (50 mg total) by mouth daily. 03/07/23   Amin, Sumayya, MD  rosuvastatin  (CRESTOR ) 40 MG tablet Take 20 mg by mouth every evening.    [provider]    Physical Exam   Triage Vital Signs: ED Triage Vitals  Encounter Vitals Group     BP 01/16/24 0155 (!) 148/83     Systolic BP Percentile --      Diastolic BP Percentile --      Pulse Rate 01/16/24 0155 69     Resp 01/16/24 0155 (!) 22     Temp 01/16/24 0155 98 F (36.7 C)     Temp Source 01/16/24 0155 Oral     SpO2  01/16/24 0155 96 %     Weight 01/16/24 0217 220 lb (99.8 kg)     Height 01/16/24 0217 5\' 8"  (1.727 m)     Head Circumference --      Peak Flow --      Pain Score 01/16/24 0213 1     Pain Loc --      Pain Education --      Exclude from Growth Chart --     Most recent vital signs: Vitals:   01/16/24 0155 01/16/24 0200  BP: (!) 148/83 (!) 148/83  Pulse: 69   Resp: (!) 22 11  Temp: 98 F (36.7 C)   SpO2: 96%      CONSTITUTIONAL: Alert, responds appropriately to questions.  Appears intoxicated.  Smells of EtOH. HEAD: Normocephalic; multiple facial abrasions EYES: Conjunctivae clear, PERRL, EOMI ENT: normal nose; no rhinorrhea; moist mucous membranes; pharynx without lesions noted; no  dental injury; no septal hematoma, no epistaxis; no facial deformity or bony tenderness NECK: Supple, no midline spinal tenderness, step-off or deformity; trachea midline CARD: RRR; S1 and S2 appreciated; no murmurs, no clicks, no rubs, no gallops RESP: Normal chest excursion without splinting or tachypnea; breath sounds clear and equal bilaterally; no wheezes, no rhonchi, no rales; no hypoxia or respiratory distress CHEST:  chest wall stable, no crepitus or ecchymosis or deformity, nontender to palpation; no flail chest ABD/GI: Non-distended; soft, tender in the left lower quadrant, no guarding or rebound, no bruising or other soft tissue lesions noted PELVIS:  stable, nontender to palpation BACK:  The back appears normal; no midline spinal tenderness, step-off or deformity EXT: Normal ROM in all joints; no edema; normal capillary refill; no cyanosis, no bony tenderness or bony deformity of patient's extremities, no joint effusions, compartments are soft, extremities are warm and well-perfused, no ecchymosis SKIN: Normal color for age and race; warm NEURO: No facial asymmetry, normal speech, moving all extremities equally, normal gait  ED Results / Procedures / Treatments   LABS: (all labs ordered are listed, but only abnormal results are displayed) Labs Reviewed  CBC WITH DIFFERENTIAL/PLATELET - Abnormal; Notable for the following components:      Result Value   RDW 16.5 (*)    All other components within normal limits  COMPREHENSIVE METABOLIC PANEL WITH GFR - Abnormal; Notable for the following components:   Sodium 133 (*)    Potassium 3.4 (*)    Chloride 95 (*)    Creatinine, Ser 1.67 (*)    Calcium  8.6 (*)    Total Protein 8.3 (*)    AST 42 (*)    Alkaline Phosphatase 127 (*)    GFR, Estimated 45 (*)    All other components within normal limits  ETHANOL - Abnormal; Notable for the following components:   Alcohol, Ethyl (B) 282 (*)    All other components within normal limits   URINALYSIS, ROUTINE W REFLEX MICROSCOPIC - Abnormal; Notable for the following components:   Color, Urine STRAW (*)    APPearance CLEAR (*)    Specific Gravity, Urine 1.003 (*)    Hgb urine dipstick SMALL (*)    All other components within normal limits  URINE DRUG SCREEN, QUALITATIVE ([facility] ONLY) - Abnormal; Notable for the following components:   Cannabinoid 50 Ng, Ur [place] POSITIVE (*)    All other components within normal limits  TROPONIN I (HIGH SENSITIVITY) - Abnormal; Notable for the following components:   Troponin I (High Sensitivity) 31 (*)    All other  components within normal limits  TROPONIN I (HIGH SENSITIVITY) - Abnormal; Notable for the following components:   Troponin I (High Sensitivity) 35 (*)    All other components within normal limits  LIPASE, BLOOD  MAGNESIUM      EKG:  EKG Interpretation Date/Time:  Sunday January 16 2024 01:59:47 EDT Ventricular Rate:  67 PR Interval:  243 QRS Duration:  117 QT Interval:  457 QTC Calculation: 483 R Axis:   43  Text Interpretation: Sinus rhythm Prolonged PR interval Nonspecific intraventricular conduction delay Anteroseptal infarct, old Abnormal T, consider ischemia, diffuse leads Confirmed by Verneda Golder [phone]) on 01/16/2024 2:10:08 AM          RADIOLOGY: My personal review and interpretation of imaging: Patient has nasal bone fractures.  I have personally reviewed all radiology reports. CT CHEST ABDOMEN PELVIS W CONTRAST Result Date: 01/16/2024 CLINICAL DATA:  Recent fall EXAM: CT CHEST, ABDOMEN, AND PELVIS WITH CONTRAST TECHNIQUE: Multidetector CT imaging of the chest, abdomen and pelvis was performed following the standard protocol during bolus administration of intravenous contrast. RADIATION DOSE REDUCTION: This exam was performed according to the departmental dose-optimization program which includes automated exposure control, adjustment of the mA and/or kV according to patient size and/or use of iterative  reconstruction technique. CONTRAST:  80mL OMNIPAQUE  IOHEXOL  300 MG/ML  SOLN COMPARISON:  None Available. FINDINGS: CT CHEST FINDINGS Cardiovascular: Atherosclerotic calcifications of the thoracic aorta are noted. No aneurysmal dilatation or dissection is noted. No pulmonary emboli are seen. Mild coronary calcifications are noted. Heart is at the upper limits of normal in size. Mediastinum/Nodes: Thoracic inlet is within normal limits. No hilar or mediastinal adenopathy is noted. The esophagus is within normal limits. Lungs/Pleura: The lungs are well aerated bilaterally. No focal infiltrate or effusion is seen. Mild emphysematous changes are noted. Musculoskeletal: No chest wall mass or suspicious bone lesions identified. CT ABDOMEN PELVIS FINDINGS Hepatobiliary: No focal liver abnormality is seen. No gallstones, gallbladder wall thickening, or biliary dilatation. Pancreas: Unremarkable. No pancreatic ductal dilatation or surrounding inflammatory changes. Spleen: Normal in size without focal abnormality. Adrenals/Urinary Tract: Adrenal glands are within normal limits. Kidneys are well visualized bilaterally. No renal calculi or obstructive changes are noted. Stomach/Bowel: Scattered diverticular change of the colon is noted. No obstructive or inflammatory changes are identified. The appendix is within normal limits. Small bowel and stomach unremarkable. Vascular/Lymphatic: Aortic atherosclerosis. No enlarged abdominal or pelvic lymph nodes. Reproductive: Prostate is unremarkable. Other: No abdominal wall hernia or abnormality. No abdominopelvic ascites. Musculoskeletal: No acute or significant osseous findings. IMPRESSION: No acute abnormality is identified to correspond with the given clinical history. Electronically Signed   By: Violeta Grey M.D.   On: 01/16/2024 03:39   CT HEAD WO CONTRAST ( ) Result Date: 01/16/2024 CLINICAL DATA:  Intoxicated and recent fall, initial encounter EXAM: CT HEAD WITHOUT CONTRAST  CT MAXILLOFACIAL WITHOUT CONTRAST CT CERVICAL SPINE WITHOUT CONTRAST TECHNIQUE: Multidetector CT imaging of the head, cervical spine, and maxillofacial structures were performed using the standard protocol without intravenous contrast. Multiplanar CT image reconstructions of the cervical spine and maxillofacial structures were also generated. RADIATION DOSE REDUCTION: This exam was performed according to the departmental dose-optimization program which includes automated exposure control, adjustment of the mA and/or kV according to patient size and/or use of iterative reconstruction technique. COMPARISON:  None Available. FINDINGS: CT HEAD FINDINGS Brain: No evidence of acute infarction, hemorrhage, hydrocephalus, extra-axial collection or mass lesion/mass effect. Small lacunar infarct is noted in the basal ganglia on the right. Vascular: No  hyperdense vessel or unexpected calcification. Skull: Normal. Negative for fracture or focal lesion. Other: None. CT MAXILLOFACIAL FINDINGS Osseous: Mildly displaced nasal bone fractures are seen. Multiple dental caries are noted. Periapical lucencies are noted related to the dental disease in the mandible bilaterally. Similar findings are noted within the maxilla on the right Orbits: Orbits and their contents are within normal limits. Sinuses: Paranasal sinuses show mucosal thickening in the right maxillary antrum. No air-fluid level is noted. Soft tissues: Surrounding soft tissue structures show no focal hematoma. CT CERVICAL SPINE FINDINGS Alignment: Mild straightening of the normal cervical lordosis is noted. Skull base and vertebrae: 7 cervical segments are well visualized. Multilevel osteophytic change and facet hypertrophic changes are noted. No acute fracture or acute facet abnormality is noted. The odontoid is within normal limits. Soft tissues and spinal canal: Surrounding soft tissue structures are within normal limits. Upper chest: Visualized lung apices are  unremarkable. Other: None IMPRESSION: CT of the head: Chronic ischemic changes without acute abnormality. CT of the maxillofacial bones: Mildly displaced nasal bone fractures. Extensive dental caries with periapical lucencies. CT of the cervical spine: Mild degenerative changes of the cervical spine are seen without acute abnormality. Electronically Signed   By: Violeta Grey M.D.   On: 01/16/2024 03:33   CT Maxillofacial Wo Contrast Result Date: 01/16/2024 CLINICAL DATA:  Intoxicated and recent fall, initial encounter EXAM: CT HEAD WITHOUT CONTRAST CT MAXILLOFACIAL WITHOUT CONTRAST CT CERVICAL SPINE WITHOUT CONTRAST TECHNIQUE: Multidetector CT imaging of the head, cervical spine, and maxillofacial structures were performed using the standard protocol without intravenous contrast. Multiplanar CT image reconstructions of the cervical spine and maxillofacial structures were also generated. RADIATION DOSE REDUCTION: This exam was performed according to the departmental dose-optimization program which includes automated exposure control, adjustment of the mA and/or kV according to patient size and/or use of iterative reconstruction technique. COMPARISON:  None Available. FINDINGS: CT HEAD FINDINGS Brain: No evidence of acute infarction, hemorrhage, hydrocephalus, extra-axial collection or mass lesion/mass effect. Small lacunar infarct is noted in the basal ganglia on the right. Vascular: No hyperdense vessel or unexpected calcification. Skull: Normal. Negative for fracture or focal lesion. Other: None. CT MAXILLOFACIAL FINDINGS Osseous: Mildly displaced nasal bone fractures are seen. Multiple dental caries are noted. Periapical lucencies are noted related to the dental disease in the mandible bilaterally. Similar findings are noted within the maxilla on the right Orbits: Orbits and their contents are within normal limits. Sinuses: Paranasal sinuses show mucosal thickening in the right maxillary antrum. No air-fluid  level is noted. Soft tissues: Surrounding soft tissue structures show no focal hematoma. CT CERVICAL SPINE FINDINGS Alignment: Mild straightening of the normal cervical lordosis is noted. Skull base and vertebrae: 7 cervical segments are well visualized. Multilevel osteophytic change and facet hypertrophic changes are noted. No acute fracture or acute facet abnormality is noted. The odontoid is within normal limits. Soft tissues and spinal canal: Surrounding soft tissue structures are within normal limits. Upper chest: Visualized lung apices are unremarkable. Other: None IMPRESSION: CT of the head: Chronic ischemic changes without acute abnormality. CT of the maxillofacial bones: Mildly displaced nasal bone fractures. Extensive dental caries with periapical lucencies. CT of the cervical spine: Mild degenerative changes of the cervical spine are seen without acute abnormality. Electronically Signed   By: Violeta Grey M.D.   On: 01/16/2024 03:33   CT Cervical Spine Wo Contrast Result Date: 01/16/2024 CLINICAL DATA:  Intoxicated and recent fall, initial encounter EXAM: CT HEAD WITHOUT CONTRAST CT MAXILLOFACIAL WITHOUT  CONTRAST CT CERVICAL SPINE WITHOUT CONTRAST TECHNIQUE: Multidetector CT imaging of the head, cervical spine, and maxillofacial structures were performed using the standard protocol without intravenous contrast. Multiplanar CT image reconstructions of the cervical spine and maxillofacial structures were also generated. RADIATION DOSE REDUCTION: This exam was performed according to the departmental dose-optimization program which includes automated exposure control, adjustment of the mA and/or kV according to patient size and/or use of iterative reconstruction technique. COMPARISON:  None Available. FINDINGS: CT HEAD FINDINGS Brain: No evidence of acute infarction, hemorrhage, hydrocephalus, extra-axial collection or mass lesion/mass effect. Small lacunar infarct is noted in the basal ganglia on the  right. Vascular: No hyperdense vessel or unexpected calcification. Skull: Normal. Negative for fracture or focal lesion. Other: None. CT MAXILLOFACIAL FINDINGS Osseous: Mildly displaced nasal bone fractures are seen. Multiple dental caries are noted. Periapical lucencies are noted related to the dental disease in the mandible bilaterally. Similar findings are noted within the maxilla on the right Orbits: Orbits and their contents are within normal limits. Sinuses: Paranasal sinuses show mucosal thickening in the right maxillary antrum. No air-fluid level is noted. Soft tissues: Surrounding soft tissue structures show no focal hematoma. CT CERVICAL SPINE FINDINGS Alignment: Mild straightening of the normal cervical lordosis is noted. Skull base and vertebrae: 7 cervical segments are well visualized. Multilevel osteophytic change and facet hypertrophic changes are noted. No acute fracture or acute facet abnormality is noted. The odontoid is within normal limits. Soft tissues and spinal canal: Surrounding soft tissue structures are within normal limits. Upper chest: Visualized lung apices are unremarkable. Other: None IMPRESSION: CT of the head: Chronic ischemic changes without acute abnormality. CT of the maxillofacial bones: Mildly displaced nasal bone fractures. Extensive dental caries with periapical lucencies. CT of the cervical spine: Mild degenerative changes of the cervical spine are seen without acute abnormality. Electronically Signed   By: Violeta Grey M.D.   On: 01/16/2024 03:33     PROCEDURES:  Critical Care performed: No    .1-3 Lead EKG Interpretation  Performed by: Delance Weide, Clover Dao, DO Authorized by: Honestee Revard, Clover Dao, DO     Interpretation: normal     ECG rate:  69   ECG rate assessment: normal     Rhythm: sinus rhythm     Ectopy: none     Conduction: normal       IMPRESSION / MDM / ASSESSMENT AND PLAN / ED COURSE  I reviewed the triage vital signs and the nursing  notes.  Patient here with a fall while intoxicated.  Complaining of facial pain, chest pain, left lower quadrant abdominal pain.  The patient is on the cardiac monitor to evaluate for evidence of arrhythmia and/or significant heart rate changes.   DIFFERENTIAL DIAGNOSIS (includes but not limited to):   Alcohol intoxication, facial abrasions, skull fracture, intracranial hemorrhage, cervical spine fracture, chest wall contusion, ACS, CHF, pneumothorax, rib fracture, diverticulitis, colitis, UTI, kidney stone, pyelonephritis, doubt PE or dissection.  Patient's presentation is most consistent with acute presentation with potential threat to life or bodily function.  PLAN: Will obtain labs, urine, trauma CT scans.  He states his tetanus vaccine is up-to-date.  Will give IV fluids.  Will clean the wounds to his face and apply bacitracin ointment.   MEDICATIONS GIVEN IN ED: Medications  bacitracin ointment (has no administration in time range)  lidocaine -prilocaine (EMLA) cream (has no administration in time range)  sodium chloride  0.9 % bolus 1,000 mL (0 mLs Intravenous Stopped 01/16/24 0330)  iohexol  (OMNIPAQUE ) 300  MG/ML solution 80 mL (80 mLs Intravenous Contrast Given 01/16/24 0304)  potassium chloride  SA (KLOR-CON  M) CR tablet 40 mEq (40 mEq Oral Given 01/16/24 0251)     ED COURSE: Patient's labs show normal hemoglobin.  Stable chronic kidney disease.  Alcohol level of 282.  Drug screen positive for cannabinoids.  Urine shows no sign of infection.  CT scans reviewed and interpreted by myself and the radiologist.  He has nasal bone fractures but no other acute abnormality seen.  Given he does have a cardiac history and was complaining of chest pain although seems to be related to the trauma, EKG and troponin was obtained.  First troponin minimally elevated at 31 which is around his baseline.  Second is pending.  EKG shows diffuse ischemia.  No ST elevation.    Second troponin flat at 35.   Patient remains hemodynamically stable.  I feel he is safe to be discharged.  He will call a sober ride.  Given his history of alcohol use disorder, will avoid narcotics as I feel this could worsen the number of falls that he has as he states this is his third fall in the year.  Will have him avoid ibuprofen  given chronic kidney disease.  Recommended Tylenol  sparingly for pain control.  Discussed wound care instructions.  Will discharge with Lidoderm  ointment that he can apply to his face.  Will give ENT follow-up for his nasal fracture.   At this time, I do not feel there is any life-threatening condition present. I reviewed all nursing notes, vitals, pertinent previous records.  All lab and urine results, EKGs, imaging ordered have been independently reviewed and interpreted by myself.  I reviewed all available radiology reports from any imaging ordered this visit.  Based on my assessment, I feel the patient is safe to be discharged home without further emergent workup and can continue workup as an outpatient as needed. Discussed all findings, treatment plan as well as usual and customary return precautions.  They verbalize understanding and are comfortable with this plan.  Outpatient follow-up has been provided as needed.  All questions have been answered.  CONSULTS:  none   OUTSIDE RECORDS REVIEWED: Reviewed last admission in August 2024.       FINAL CLINICAL IMPRESSION(S) / ED DIAGNOSES   Final diagnoses:  Fall, initial encounter  Injury of head, initial encounter  Alcoholic intoxication without complication (HCC)  Facial abrasion, initial encounter  Closed fracture of nasal bone, initial encounter     Rx / DC Orders   ED Discharge Orders     None        Note:  This document was prepared using Dragon voice recognition software and may include unintentional dictation errors.   Kailey Esquilin, Clover Dao, DO 01/16/24 [phone]

## 2024-01-16 NOTE — Discharge Instructions (Signed)
 Please clean your wounds gently with soap and warm water and apply Neosporin.  We are giving you a prescription for lidocaine  cream which you can apply to your abrasions.  You also have nasal bone fractures.  We have given you ENT follow-up for this.  You may take Tylenol  1000 mg every 6 hours as needed for pain.  Please avoid ibuprofen , aspirin , Aleve, Goody powders given your history of chronic kidney disease.

## 2024-01-16 NOTE — ED Notes (Signed)
 Patient Alert and oriented to baseline. Stable and ambulatory to baseline. Patient verbalized understanding of the discharge instructions.  Patient belongings were taken by the patient.

## 2024-01-16 NOTE — ED Triage Notes (Signed)
 Pt brought in by EMS for unwitnessed fall about 1 hour ago.  Pt was found on the concrete with abrasions on his face.  Pt not c/o any injuries d/t fall, but is concerned as this is his 4th fall this year.  Pt appears intoxicated. Pt sts he is having chest pain and abdominal pain that began prior to this fall.

## 2024-01-16 NOTE — ED Notes (Signed)
 Pt awake and alert, attempting to contact a ride home.

## 2024-01-16 NOTE — ED Notes (Signed)
Pt ambulatory to the restroom without difficulty. Gait steady and even.  

## 2024-01-16 NOTE — ED Notes (Signed)
 Pt was placed on 2L due to O2 being 88%
